# Patient Record
Sex: Female | Born: 1950
Health system: Southern US, Community
[De-identification: ages and names within clinical notes are randomized; demographics above are authoritative.]

## PROBLEM LIST (undated history)

## (undated) DIAGNOSIS — I6529 Occlusion and stenosis of unspecified carotid artery: Secondary | ICD-10-CM

## (undated) DIAGNOSIS — I739 Peripheral vascular disease, unspecified: Secondary | ICD-10-CM

## (undated) DIAGNOSIS — Z973 Presence of spectacles and contact lenses: Secondary | ICD-10-CM

## (undated) DIAGNOSIS — E7800 Pure hypercholesterolemia, unspecified: Secondary | ICD-10-CM

## (undated) DIAGNOSIS — R011 Cardiac murmur, unspecified: Secondary | ICD-10-CM

## (undated) DIAGNOSIS — F32A Depression, unspecified: Secondary | ICD-10-CM

## (undated) DIAGNOSIS — F329 Major depressive disorder, single episode, unspecified: Secondary | ICD-10-CM

## (undated) DIAGNOSIS — Z800 Family history of malignant neoplasm of digestive organs: Secondary | ICD-10-CM

## (undated) DIAGNOSIS — I219 Acute myocardial infarction, unspecified: Secondary | ICD-10-CM

## (undated) DIAGNOSIS — I10 Essential (primary) hypertension: Secondary | ICD-10-CM

## (undated) DIAGNOSIS — I209 Angina pectoris, unspecified: Secondary | ICD-10-CM

## (undated) DIAGNOSIS — I639 Cerebral infarction, unspecified: Secondary | ICD-10-CM

## (undated) DIAGNOSIS — E119 Type 2 diabetes mellitus without complications: Secondary | ICD-10-CM

## (undated) DIAGNOSIS — F419 Anxiety disorder, unspecified: Secondary | ICD-10-CM

## (undated) DIAGNOSIS — I2510 Atherosclerotic heart disease of native coronary artery without angina pectoris: Secondary | ICD-10-CM

## (undated) DIAGNOSIS — M1990 Unspecified osteoarthritis, unspecified site: Secondary | ICD-10-CM

## (undated) DIAGNOSIS — K219 Gastro-esophageal reflux disease without esophagitis: Secondary | ICD-10-CM

## (undated) HISTORY — DX: Peripheral vascular disease, unspecified: I73.9

## (undated) HISTORY — PX: CORONARY ANGIOPLASTY WITH STENT PLACEMENT: SHX49

## (undated) HISTORY — PX: ABDOMINAL HYSTERECTOMY: SHX81

## (undated) HISTORY — PX: TUBAL LIGATION: SHX77

## (undated) HISTORY — DX: Family history of malignant neoplasm of digestive organs: Z80.0

## (undated) HISTORY — PX: CAROTID ENDARTERECTOMY: SUR193

---

## 1988-10-12 DIAGNOSIS — I219 Acute myocardial infarction, unspecified: Secondary | ICD-10-CM

## 1988-10-12 HISTORY — DX: Acute myocardial infarction, unspecified: I21.9

## 2003-02-12 DIAGNOSIS — I2510 Atherosclerotic heart disease of native coronary artery without angina pectoris: Secondary | ICD-10-CM

## 2003-02-12 HISTORY — DX: Atherosclerotic heart disease of native coronary artery without angina pectoris: I25.10

## 2010-07-18 ENCOUNTER — Emergency Department (HOSPITAL_COMMUNITY): Payer: Medicaid Other

## 2010-07-18 ENCOUNTER — Emergency Department (HOSPITAL_COMMUNITY)
Admission: EM | Admit: 2010-07-18 | Discharge: 2010-07-18 | Disposition: A | Discharge status: Home / Self Care | Payer: Medicaid Other | Attending: Emergency Medicine | Admitting: Emergency Medicine

## 2010-07-18 DIAGNOSIS — R05 Cough: Secondary | ICD-10-CM | POA: Insufficient documentation

## 2010-07-18 DIAGNOSIS — R059 Cough, unspecified: Secondary | ICD-10-CM | POA: Insufficient documentation

## 2010-07-18 DIAGNOSIS — E119 Type 2 diabetes mellitus without complications: Secondary | ICD-10-CM | POA: Insufficient documentation

## 2010-07-18 DIAGNOSIS — E785 Hyperlipidemia, unspecified: Secondary | ICD-10-CM | POA: Insufficient documentation

## 2010-07-18 DIAGNOSIS — I10 Essential (primary) hypertension: Secondary | ICD-10-CM | POA: Insufficient documentation

## 2010-10-02 ENCOUNTER — Emergency Department (HOSPITAL_COMMUNITY)
Admission: EM | Admit: 2010-10-02 | Discharge: 2010-10-02 | Disposition: A | Discharge status: Home / Self Care | Payer: Medicaid Other | Attending: Emergency Medicine | Admitting: Emergency Medicine

## 2010-10-02 DIAGNOSIS — E7800 Pure hypercholesterolemia, unspecified: Secondary | ICD-10-CM | POA: Insufficient documentation

## 2010-10-02 DIAGNOSIS — I10 Essential (primary) hypertension: Secondary | ICD-10-CM | POA: Insufficient documentation

## 2010-10-02 DIAGNOSIS — I252 Old myocardial infarction: Secondary | ICD-10-CM | POA: Insufficient documentation

## 2010-10-02 DIAGNOSIS — R51 Headache: Secondary | ICD-10-CM | POA: Insufficient documentation

## 2010-10-02 DIAGNOSIS — E119 Type 2 diabetes mellitus without complications: Secondary | ICD-10-CM | POA: Insufficient documentation

## 2010-10-02 LAB — BASIC METABOLIC PANEL
BUN: 18 mg/dL (ref 6–23)
Chloride: 102 mEq/L (ref 96–112)
Creatinine, Ser: 0.56 mg/dL (ref 0.50–1.10)
GFR calc Af Amer: >60 mL/min (ref >60)
GFR calc non Af Amer: >60 mL/min (ref >60)
Glucose, Bld: 110 mg/dL — ABNORMAL HIGH (ref 70–99)

## 2010-10-02 LAB — CBC
HCT: 36 % (ref 36.0–46.0)
Hemoglobin: 11.7 g/dL — ABNORMAL LOW (ref 12.0–15.0)
MCHC: 32.5 g/dL (ref 30.0–36.0)
MCV: 78.1 fL (ref 78.0–100.0)
RDW: 15.2 % (ref 11.5–15.5)

## 2010-10-02 LAB — DIFFERENTIAL
Eosinophils Relative: 1 % (ref 0–5)
Lymphocytes Relative: 44 % (ref 12–46)
Lymphs Abs: 4.5 10*3/uL — ABNORMAL HIGH (ref 0.7–4.0)
Monocytes Relative: 5 % (ref 3–12)
Neutro Abs: 5.1 10*3/uL (ref 1.7–7.7)

## 2011-05-31 ENCOUNTER — Emergency Department (HOSPITAL_COMMUNITY): Payer: Medicaid Other

## 2011-05-31 ENCOUNTER — Observation Stay (HOSPITAL_COMMUNITY): Payer: Medicaid Other

## 2011-05-31 ENCOUNTER — Encounter (HOSPITAL_COMMUNITY): Payer: Self-pay | Admitting: Emergency Medicine

## 2011-05-31 ENCOUNTER — Observation Stay (HOSPITAL_COMMUNITY)
Admission: EM | Admit: 2011-05-31 | Discharge: 2011-06-03 | Disposition: A | Discharge status: Home / Self Care | Payer: Medicaid Other | Attending: Internal Medicine | Admitting: Internal Medicine

## 2011-05-31 DIAGNOSIS — E785 Hyperlipidemia, unspecified: Secondary | ICD-10-CM | POA: Diagnosis present

## 2011-05-31 DIAGNOSIS — E876 Hypokalemia: Secondary | ICD-10-CM

## 2011-05-31 DIAGNOSIS — F17200 Nicotine dependence, unspecified, uncomplicated: Secondary | ICD-10-CM | POA: Insufficient documentation

## 2011-05-31 DIAGNOSIS — I059 Rheumatic mitral valve disease, unspecified: Secondary | ICD-10-CM

## 2011-05-31 DIAGNOSIS — I10 Essential (primary) hypertension: Secondary | ICD-10-CM | POA: Diagnosis present

## 2011-05-31 DIAGNOSIS — I2510 Atherosclerotic heart disease of native coronary artery without angina pectoris: Secondary | ICD-10-CM | POA: Diagnosis present

## 2011-05-31 DIAGNOSIS — R0789 Other chest pain: Secondary | ICD-10-CM

## 2011-05-31 DIAGNOSIS — D649 Anemia, unspecified: Secondary | ICD-10-CM | POA: Diagnosis present

## 2011-05-31 DIAGNOSIS — D72829 Elevated white blood cell count, unspecified: Secondary | ICD-10-CM | POA: Diagnosis present

## 2011-05-31 DIAGNOSIS — R079 Chest pain, unspecified: Secondary | ICD-10-CM

## 2011-05-31 DIAGNOSIS — E7800 Pure hypercholesterolemia, unspecified: Secondary | ICD-10-CM | POA: Insufficient documentation

## 2011-05-31 DIAGNOSIS — Z9861 Coronary angioplasty status: Secondary | ICD-10-CM | POA: Insufficient documentation

## 2011-05-31 DIAGNOSIS — E119 Type 2 diabetes mellitus without complications: Secondary | ICD-10-CM | POA: Diagnosis present

## 2011-05-31 HISTORY — DX: Anemia, unspecified: D64.9

## 2011-05-31 HISTORY — DX: Elevated white blood cell count, unspecified: D72.829

## 2011-05-31 HISTORY — DX: Unspecified osteoarthritis, unspecified site: M19.90

## 2011-05-31 HISTORY — DX: Depression, unspecified: F32.A

## 2011-05-31 HISTORY — DX: Essential (primary) hypertension: I10

## 2011-05-31 HISTORY — DX: Angina pectoris, unspecified: I20.9

## 2011-05-31 HISTORY — DX: Hyperlipidemia, unspecified: E78.5

## 2011-05-31 HISTORY — DX: Major depressive disorder, single episode, unspecified: F32.9

## 2011-05-31 HISTORY — DX: Cerebral infarction, unspecified: I63.9

## 2011-05-31 HISTORY — DX: Atherosclerotic heart disease of native coronary artery without angina pectoris: I25.10

## 2011-05-31 HISTORY — DX: Hypokalemia: E87.6

## 2011-05-31 HISTORY — DX: Acute myocardial infarction, unspecified: I21.9

## 2011-05-31 HISTORY — DX: Other chest pain: R07.89

## 2011-05-31 LAB — CBC
HCT: 31.2 % — ABNORMAL LOW (ref 36.0–46.0)
Hemoglobin: 10.3 g/dL — ABNORMAL LOW (ref 12.0–15.0)
MCH: 25.9 pg — ABNORMAL LOW (ref 26.0–34.0)
MCHC: 33 g/dL (ref 30.0–36.0)
Platelets: 331 10*3/uL (ref 150–400)
RBC: 3.99 MIL/uL (ref 3.87–5.11)
RDW: 15 % (ref 11.5–15.5)
RDW: 15 % (ref 11.5–15.5)
WBC: 9.9 10*3/uL (ref 4.0–10.5)

## 2011-05-31 LAB — VITAMIN B12: Vitamin B-12: 456 pg/mL (ref 211–911)

## 2011-05-31 LAB — POCT I-STAT TROPONIN I: Troponin i, poc: 0.01 ng/mL (ref 0.00–0.08)

## 2011-05-31 LAB — LIPID PANEL
HDL: 42 mg/dL (ref >39)
LDL Cholesterol: 72 mg/dL (ref 0–99)
Total CHOL/HDL Ratio: 3.6 RATIO
Triglycerides: 190 mg/dL — ABNORMAL HIGH (ref <150)
VLDL: 38 mg/dL (ref 0–40)

## 2011-05-31 LAB — COMPREHENSIVE METABOLIC PANEL
Albumin: 4.1 g/dL (ref 3.5–5.2)
Alkaline Phosphatase: 50 U/L (ref 39–117)
BUN: 18 mg/dL (ref 6–23)
Calcium: 10.3 mg/dL (ref 8.4–10.5)
GFR calc Af Amer: >90 mL/min (ref >90)
Glucose, Bld: 124 mg/dL — ABNORMAL HIGH (ref 70–99)
Potassium: 3 mEq/L — ABNORMAL LOW (ref 3.5–5.1)
Total Protein: 7.3 g/dL (ref 6.0–8.3)

## 2011-05-31 LAB — CARDIAC PANEL(CRET KIN+CKTOT+MB+TROPI)
CK, MB: 1.6 ng/mL (ref 0.3–4.0)
Relative Index: 1.5 (ref 0.0–2.5)
Relative Index: 1.4 (ref 0.0–2.5)
Total CK: 104 U/L (ref 7–177)
Total CK: 114 U/L (ref 7–177)
Troponin I: <0.3 ng/mL (ref <0.30)
Troponin I: <0.3 ng/mL (ref <0.30)

## 2011-05-31 LAB — D-DIMER, QUANTITATIVE: D-Dimer, Quant: 11.16 ug/mL-FEU — ABNORMAL HIGH (ref 0.00–0.48)

## 2011-05-31 LAB — FERRITIN: Ferritin: 198 ng/mL (ref 10–291)

## 2011-05-31 LAB — GLUCOSE, CAPILLARY
Glucose-Capillary: 108 mg/dL — ABNORMAL HIGH (ref 70–99)
Glucose-Capillary: 112 mg/dL — ABNORMAL HIGH (ref 70–99)

## 2011-05-31 LAB — RETICULOCYTES
RBC.: 3.86 MIL/uL — ABNORMAL LOW (ref 3.87–5.11)
Retic Ct Pct: 1.2 % (ref 0.4–3.1)

## 2011-05-31 LAB — MAGNESIUM: Magnesium: 1.7 mg/dL (ref 1.5–2.5)

## 2011-05-31 LAB — CREATININE, SERUM
Creatinine, Ser: 0.66 mg/dL (ref 0.50–1.10)
GFR calc Af Amer: >90 mL/min (ref >90)

## 2011-05-31 MED ORDER — IOHEXOL 350 MG/ML SOLN
80.0000 mL | Freq: Once | INTRAVENOUS | Status: AC | PRN
  Administered 2011-05-31: 80 mL via INTRAVENOUS

## 2011-05-31 MED ORDER — HYDROCHLOROTHIAZIDE 12.5 MG PO CAPS
12.5000 mg | ORAL_CAPSULE | Freq: Every day | ORAL | Status: DC
  Administered 2011-05-31 – 2011-06-03 (×4): 12.5 mg via ORAL
  Filled 2011-05-31 (×4): qty 1

## 2011-05-31 MED ORDER — GI COCKTAIL ~~LOC~~
30.0000 mL | Freq: Once | ORAL | Status: AC
  Administered 2011-05-31: 30 mL via ORAL
  Filled 2011-05-31: qty 30

## 2011-05-31 MED ORDER — NITROGLYCERIN 0.4 MG SL SUBL: 0.4000 mg | SUBLINGUAL_TABLET | SUBLINGUAL | Status: DC | PRN

## 2011-05-31 MED ORDER — SIMVASTATIN 5 MG PO TABS
5.0000 mg | ORAL_TABLET | Freq: Every day | ORAL | Status: DC
  Administered 2011-05-31 – 2011-06-02 (×3): 5 mg via ORAL
  Filled 2011-05-31 (×4): qty 1

## 2011-05-31 MED ORDER — SODIUM CHLORIDE 0.9 % IJ SOLN
3.0000 mL | Freq: Two times a day (BID) | INTRAMUSCULAR | Status: DC
  Administered 2011-05-31 – 2011-06-03 (×2): 3 mL via INTRAVENOUS

## 2011-05-31 MED ORDER — ASPIRIN EC 81 MG PO TBEC
81.0000 mg | DELAYED_RELEASE_TABLET | Freq: Every day | ORAL | Status: DC
  Administered 2011-05-31: 81 mg via ORAL
  Filled 2011-05-31: qty 1

## 2011-05-31 MED ORDER — INSULIN ASPART 100 UNIT/ML ~~LOC~~ SOLN
0.0000 [IU] | Freq: Three times a day (TID) | SUBCUTANEOUS | Status: DC
  Administered 2011-06-03: 1 [IU] via SUBCUTANEOUS

## 2011-05-31 MED ORDER — LISINOPRIL-HYDROCHLOROTHIAZIDE 20-12.5 MG PO TABS: 1.0000 | ORAL_TABLET | Freq: Every day | ORAL | Status: DC

## 2011-05-31 MED ORDER — AMLODIPINE BESYLATE 5 MG PO TABS
5.0000 mg | ORAL_TABLET | Freq: Every day | ORAL | Status: DC
  Administered 2011-05-31: 5 mg via ORAL
  Filled 2011-05-31: qty 1

## 2011-05-31 MED ORDER — ONDANSETRON HCL 4 MG/2ML IJ SOLN: 4.0000 mg | Freq: Four times a day (QID) | INTRAMUSCULAR | Status: DC | PRN

## 2011-05-31 MED ORDER — SODIUM CHLORIDE 0.9 % IV SOLN: 1000.0000 mL | INTRAVENOUS | Status: DC

## 2011-05-31 MED ORDER — ENOXAPARIN SODIUM 40 MG/0.4ML ~~LOC~~ SOLN
40.0000 mg | Freq: Every day | SUBCUTANEOUS | Status: DC
  Administered 2011-05-31: 40 mg via SUBCUTANEOUS
  Filled 2011-05-31: qty 0.4

## 2011-05-31 MED ORDER — METOPROLOL TARTRATE 25 MG PO TABS
25.0000 mg | ORAL_TABLET | Freq: Two times a day (BID) | ORAL | Status: AC
Start: 2011-05-31 — End: 2011-06-02
  Administered 2011-05-31 – 2011-06-02 (×5): 25 mg via ORAL
  Filled 2011-05-31 (×7): qty 1

## 2011-05-31 MED ORDER — HYDROCODONE-ACETAMINOPHEN 5-325 MG PO TABS
1.0000 | ORAL_TABLET | ORAL | Status: DC | PRN
  Administered 2011-06-01 – 2011-06-02 (×3): 1 via ORAL
  Filled 2011-05-31 (×3): qty 1

## 2011-05-31 MED ORDER — MORPHINE SULFATE 2 MG/ML IJ SOLN
1.0000 mg | INTRAMUSCULAR | Status: DC | PRN
  Filled 2011-05-31: qty 1

## 2011-05-31 MED ORDER — SENNOSIDES-DOCUSATE SODIUM 8.6-50 MG PO TABS
1.0000 | ORAL_TABLET | Freq: Every evening | ORAL | Status: DC | PRN
  Filled 2011-05-31: qty 1

## 2011-05-31 MED ORDER — ONDANSETRON HCL 4 MG PO TABS: 4.0000 mg | ORAL_TABLET | Freq: Four times a day (QID) | ORAL | Status: DC | PRN

## 2011-05-31 MED ORDER — NIACIN ER 500 MG PO CPCR
500.0000 mg | ORAL_CAPSULE | Freq: Every day | ORAL | Status: DC
  Administered 2011-05-31: 500 mg via ORAL
  Filled 2011-05-31 (×3): qty 1

## 2011-05-31 MED ORDER — POTASSIUM CHLORIDE CRYS ER 20 MEQ PO TBCR
40.0000 meq | EXTENDED_RELEASE_TABLET | Freq: Once | ORAL | Status: AC
  Administered 2011-05-31: 40 meq via ORAL
  Filled 2011-05-31: qty 2

## 2011-05-31 MED ORDER — ADULT MULTIVITAMIN W/MINERALS CH
1.0000 | ORAL_TABLET | Freq: Every day | ORAL | Status: DC
  Administered 2011-05-31 – 2011-06-03 (×4): 1 via ORAL
  Filled 2011-05-31 (×4): qty 1

## 2011-05-31 MED ORDER — SODIUM CHLORIDE 0.45 % IV SOLN
INTRAVENOUS | Status: DC
  Administered 2011-05-31 – 2011-06-03 (×3): via INTRAVENOUS

## 2011-05-31 MED ORDER — ONDANSETRON HCL 4 MG/2ML IJ SOLN
4.0000 mg | INTRAMUSCULAR | Status: DC | PRN
  Administered 2011-05-31: 4 mg via INTRAVENOUS
  Filled 2011-05-31: qty 2

## 2011-05-31 MED ORDER — ENOXAPARIN SODIUM 60 MG/0.6ML ~~LOC~~ SOLN
60.0000 mg | Freq: Two times a day (BID) | SUBCUTANEOUS | Status: DC
  Administered 2011-05-31 – 2011-06-01 (×2): 60 mg via SUBCUTANEOUS
  Filled 2011-05-31 (×4): qty 0.6

## 2011-05-31 MED ORDER — ASPIRIN EC 325 MG PO TBEC
325.0000 mg | DELAYED_RELEASE_TABLET | Freq: Every day | ORAL | Status: DC
  Administered 2011-06-01 – 2011-06-03 (×3): 325 mg via ORAL
  Filled 2011-05-31 (×3): qty 1

## 2011-05-31 MED ORDER — MORPHINE SULFATE 4 MG/ML IJ SOLN
4.0000 mg | Freq: Once | INTRAMUSCULAR | Status: AC
  Administered 2011-05-31: 4 mg via INTRAVENOUS
  Filled 2011-05-31: qty 1

## 2011-05-31 MED ORDER — SODIUM CHLORIDE 0.9 % IV SOLN
1000.0000 mL | Freq: Once | INTRAVENOUS | Status: AC
  Administered 2011-05-31: 1000 mL via INTRAVENOUS

## 2011-05-31 MED ORDER — ZOLPIDEM TARTRATE 5 MG PO TABS
5.0000 mg | ORAL_TABLET | Freq: Every evening | ORAL | Status: DC | PRN
  Administered 2011-05-31 – 2011-06-02 (×3): 5 mg via ORAL
  Filled 2011-05-31 (×3): qty 1

## 2011-05-31 MED ORDER — NITROGLYCERIN 0.2 MG/HR TD PT24
0.2000 mg | MEDICATED_PATCH | Freq: Every day | TRANSDERMAL | Status: DC
  Administered 2011-05-31 – 2011-06-03 (×4): 0.2 mg via TRANSDERMAL
  Filled 2011-05-31 (×4): qty 1

## 2011-05-31 MED ORDER — ACETAMINOPHEN 325 MG PO TABS
650.0000 mg | ORAL_TABLET | Freq: Four times a day (QID) | ORAL | Status: DC | PRN
  Administered 2011-06-02: 650 mg via ORAL
  Filled 2011-05-31: qty 2

## 2011-05-31 MED ORDER — ACETAMINOPHEN 650 MG RE SUPP: 650.0000 mg | Freq: Four times a day (QID) | RECTAL | Status: DC | PRN

## 2011-05-31 MED ORDER — LISINOPRIL 20 MG PO TABS
20.0000 mg | ORAL_TABLET | Freq: Every day | ORAL | Status: DC
  Administered 2011-05-31 – 2011-06-03 (×4): 20 mg via ORAL
  Filled 2011-05-31 (×4): qty 1

## 2011-05-31 MED ORDER — MORPHINE SULFATE 2 MG/ML IJ SOLN
2.0000 mg | Freq: Once | INTRAMUSCULAR | Status: AC
  Administered 2011-05-31: 2 mg via INTRAVENOUS

## 2011-05-31 NOTE — Progress Notes (Signed)
Patient was seen and examined, admitted by Dr. Jonelle Sidle this morning, complaining of chest pain, poor historian, history of coronary artery disease with PCI - Continue to rule out ACS, stat d-dimer was obtained, elevated at 11.6, stat CTA chest was obtained negative for pulmonary embolism Doppler ultrasound of the lower extremities. Gave GI cocktail - Called cardiology consultation for assistance, no followup after her PCI    Forest Pruden M.D. [facility] 05/31/2011, 3:46 PM  Pager: [phone]

## 2011-05-31 NOTE — ED Notes (Signed)
Per EMS, pt has been having midsternal CP x 2days. Pain became worse tonight so she called EMS. SBP 260 upon arrival. 12 lead unremarkable. 2 Nitroglycerin SL. Last BP 160/90. Pain decreased from 8 to 4. 324 Baby ASA PO given. Pt stated that she took Vicodin earlier tonight to assist with pain. 20g Left wrist.

## 2011-05-31 NOTE — ED Provider Notes (Signed)
History     CSN: [id number]  Arrival date & time 05/31/11  0003   First MD Initiated Contact with Patient 05/31/11 0018      Chief Complaint  Patient presents with  . Chest Pain    (Consider location/radiation/quality/duration/timing/severity/associated sxs/prior treatment) HPI Comments: Patient with a history of CAD, diabetes, and hypertension presents emergency Department with a chief complaint of chest pain.  Onset of symptoms began 3 days ago, is intermittent in nature, location midsternal, no radiation, characterized as a squeezing pressure type sensation, severity 6/10, pain worse with movement but not exertion or pleurisy.  Patient denies associated symptoms including shortness of breath, dyspnea on exertion, orthopnea, PND, leg swelling, diaphoresis, nausea, abdominal pain, cough, hemoptysis, fever, night sweats, chills.  Patient states she currently does not have a cardiologist but has new patient appointment with a power cardiology scheduled for Monday morning.  Patient does not have a PCP.  No other complaints at this time. Pt denies being on blood thinners besides baby ASA. Pt given 324 ASA and nitro x 2 en route   Patient is a 61 y.o. female presenting with chest pain. The history is provided by the patient.  Chest Pain     Past Medical History  Diagnosis Date  . Hypertension   . Diabetes mellitus     Past Surgical History  Procedure Date  . Coronary stent placement     History reviewed. No pertinent family history.  History  Substance Use Topics  . Smoking status: Former Research scientist (life sciences)  . Smokeless tobacco: Not on file  . Alcohol Use: No    OB History    Grav Para Term Preterm Abortions TAB SAB Ect Mult Living                  Review of Systems  Cardiovascular: Positive for chest pain.  All other systems reviewed and are negative.    Allergies  Review of patient's allergies indicates no known allergies.  Home Medications   Current Outpatient Rx  Name  Route Sig Dispense Refill  . AMLODIPINE BESYLATE 5 MG PO TABS Oral Take 5 mg by mouth daily.    . ASPIRIN EC 81 MG PO TBEC Oral Take 81 mg by mouth daily.    Marland Kitchen HYDROCODONE-ACETAMINOPHEN 5-500 MG PO TABS Oral Take 1 tablet by mouth every 4 (four) hours as needed. For pain    . LISINOPRIL-HYDROCHLOROTHIAZIDE 20-12.5 MG PO TABS Oral Take 1 tablet by mouth daily.    Marland Kitchen METFORMIN HCL 500 MG PO TABS Oral Take 500 mg by mouth 2 (two) times daily with a meal.    . ADULT MULTIVITAMIN W/MINERALS CH Oral Take 1 tablet by mouth daily.    Marland Kitchen NIACIN ER (ANTIHYPERLIPIDEMIC) 500 MG PO TBCR Oral Take 500 mg by mouth at bedtime.    Marland Kitchen NITROGLYCERIN 0.4 MG SL SUBL Sublingual Place 0.4 mg under the tongue every 5 (five) minutes as needed. For chest pain    . PRAVASTATIN SODIUM 40 MG PO TABS Oral Take 40 mg by mouth daily.    Marland Kitchen ZOLPIDEM TARTRATE 5 MG PO TABS Oral Take 5 mg by mouth at bedtime as needed. For sleep      BP 134/61  Pulse 63  Temp(Src) 98.1 F (36.7 C) (Oral)  Resp 17  SpO2 100%  Physical Exam  Nursing note and vitals reviewed. Constitutional: She is oriented to person, place, and time. She appears well-developed and well-nourished. No distress.  HENT:  Head: Normocephalic and atraumatic.  Eyes: Conjunctivae and EOM are normal.  Neck: Normal range of motion.       No JVD, tracheal deviation, stridor  Cardiovascular:       Regular rate rhythm, no pitting edema, distal pulses intact.  No JVD or aberrant sounds on  auscultation.  Pulmonary/Chest: Effort normal.       Lungs clear to auscultation bilaterally  Abdominal:       Soft nontender abdomen.  Normal bowel sounds.  Non-pulsatile aorta.  Musculoskeletal: Normal range of motion.  Neurological: She is alert and oriented to person, place, and time.  Skin: Skin is warm and dry. No rash noted. She is not diaphoretic.  Psychiatric: She has a normal mood and affect. Her behavior is normal.    ED Course  Procedures (including critical care  time)   Labs Reviewed  CBC  URINALYSIS, ROUTINE W REFLEX MICROSCOPIC  COMPREHENSIVE METABOLIC PANEL   No results found.   Date: 05/31/2011  Rate: 61  Rhythm: normal sinus rhythm  QRS Axis: normal  Intervals: normal  ST/T Wave abnormalities: borderline flat t waves in inferior leads (on old EKG)  Conduction Disutrbances: none  Narrative Interpretation:   Old EKG Reviewed:No significant changes    No diagnosis found.    MDM  Chest pain   Concern for cardiac etiology of Chest Pain. Cardiology will likely be consulted. Pt does not meet criteria for CP protocol and a further evaluation is recommended. EKG reviewed, labs pending. This case was discussed with Dr. Winfred Leeds who has seen the patient, will resume care and agrees with plan to admit.          Verl Dicker, [place] 06/03/11 0615  Medical screening examination/treatment/procedure(s) were conducted as a shared visit with non-physician practitioner(s) and myself.  I personally evaluated the patient during the encounter  Orlie Dakin, MD 06/07/11 [phone]

## 2011-05-31 NOTE — Consult Note (Signed)
CARDIOLOGY CONSULT NOTE  Patient ID: Joanne Morris, MRN: [id number], DOB/AGE: 1950/05/22 61 y.o. Admit date: 05/31/2011 Date of Consult: 05/31/2011  Primary Physician: Dr. Jeanie Cooks Primary Cardiologist: New  Chief Complaint: Chest pain Reason for Consultation: Chest pain  HPI: 61 y.o. female w/ PMHx significant for CAD w/ PCI ~2008 in [place], HTN, HLD, DMII, and CVA who presented to [facility] on 05/31/2011 with complaints of chest pain.  She reports having a heart attack in ~2008 with stent placement at [facility] and has not had any cardiology follow up. She states since that time she has had intermittent chest pain. She describes it as sharp, located in the epigastric region with radiation down into her stomach, associated with nausea. It usually lasts around 2-1mins. Occurs with rest and exertion. She presented to the ED because the pain started two days ago and has waxed and waned since then without relief. Denies diaphoresis, sob, or dizziness. She is fairly active and does a lot of walking, although she says not as much because of right knee pain from arthritis. She does not weigh herself but feels like she has lost "a lot" of weight over the last two years. She estimates about 20-25lbs. Denies sob, doe, edema, orthopnea, change in appetite, melena, hematochezia, recent illness, fever, abd pain, or syncope. She states she takes all her medications as prescribed.   In the ED EKG showed NSR 64bpm with NO acute ST/T changes. CXR showed mild vascular congestion w/o significant pulm edema. Labs significant for cardiac enzymes negative x2, pBNP 46, K+ 3.0, WBC 11.8 --> 9.9, H&H 10.1/31.4, Crt 0.78, LDL72. SBPs have been in the range of 130-160s. Her amlodipine was stopped, Lopressor started, and potassium supplemented. She was admitted by medicine and cardiology is asked to consult for evaluation of her chest pain. She reports having some mild chest pain now, but states it  was improved a lot with GI Cocktail.   Past Medical History  Diagnosis Date  . Hypertension   . Diabetes mellitus   . Coronary artery disease   . Angina   . Myocardial infarction     per patient  . Stroke     per patient  . Arthritis     knee  per patient  . Depression       Surgical History:  Past Surgical History  Procedure Date  . Coronary stent placement   . Abdominal hysterectomy   . Tubal ligation      Home Meds: Medication Sig  amLODipine (NORVASC) 5 MG tablet Take 5 mg by mouth daily.  aspirin EC 81 MG tablet Take 81 mg by mouth daily.  HYDROcodone-acetaminophen (VICODIN) 5-500 MG per tablet Take 1 tablet by mouth every 4 (four) hours as needed. For pain  lisinopril-hydrochlorothiazide (PRINZIDE,ZESTORETIC) 20-12.5 MG per tablet Take 1 tablet by mouth daily.  metFORMIN (GLUCOPHAGE) 500 MG tablet Take 500 mg by mouth 2 (two) times daily with a meal.  Multiple Vitamin (MULITIVITAMIN WITH MINERALS) TABS Take 1 tablet by mouth daily.  niacin (NIASPAN) 500 MG CR tablet Take 500 mg by mouth at bedtime.  nitroGLYCERIN (NITROSTAT) 0.4 MG SL tablet Place 0.4 mg under the tongue every 5 (five) minutes as needed. For chest pain  pravastatin (PRAVACHOL) 40 MG tablet Take 40 mg by mouth daily.  zolpidem (AMBIEN) 5 MG tablet Take 5 mg by mouth at bedtime as needed. For sleep   Inpatient Medications:   . sodium chloride  1,000 mL Intravenous Once  .  aspirin EC  325 mg Oral Daily  . enoxaparin  40 mg Subcutaneous Daily  . gi cocktail  30 mL Oral Once  . lisinopril  20 mg Oral Daily   And  . hydrochlorothiazide  12.5 mg Oral Daily  . insulin aspart  0-9 Units Subcutaneous TID WC  . metoprolol tartrate  25 mg Oral BID  .  morphine injection  2 mg Intravenous Once  .  morphine injection  4 mg Intravenous Once  . mulitivitamin with minerals  1 tablet Oral Daily  . niacin  500 mg Oral QHS  . nitroGLYCERIN  0.2 mg Transdermal Daily  . potassium chloride  40 mEq Oral Once  .  simvastatin  5 mg Oral q1800  . sodium chloride  3 mL Intravenous Q12H  . DISCONTD: amLODipine  5 mg Oral Daily   . sodium chloride 75 mL/hr at 05/31/11 1142    Allergies: No Known Allergies   Social History  . Marital Status: Single   Occupational History  . Not on file.   Social History Main Topics  . Smoking status: Former Smoker    Quit date: 2012  . Smokeless tobacco: Never Used  . Alcohol Use: No  . Drug Use: No  . Sexually Active: No   Family history: No known cardiac disease  Review of Systems: General: (+) unintentional weight loss; negative for chills, fever, night sweats Cardiovascular: As per HPI Dermatological: negative for rash Respiratory: negative for cough or wheezing Urologic: negative for hematuria Abdominal: negative for nausea, vomiting, diarrhea, bright red blood per rectum, melena, or hematemesis Neurologic: negative for visual changes, syncope, or dizziness All other systems reviewed and are otherwise negative except as noted above.  Labs:   05/31/2011 00:33 05/31/2011 07:03 05/31/2011 11:25  CK, MB  1.6 1.8  CK Total  104 114  Troponin I  <0.30 <0.30  Troponin i, poc 0.01    Pro B Natriuretic peptide (BNP)  46.5    Component Value Date   WBC 9.9 05/31/2011   HGB 10.1* 05/31/2011   HCT 31.4* 05/31/2011   MCV 78.7 05/31/2011   PLT 331 05/31/2011     05/31/2011 04:57  Ferritin 198  Folate >20.0  Vitamin B-12 456   Lab 05/31/11 0025  NA 140  K 3.0*  CL 99  CO2 26  BUN 18  CREATININE 0.78  CALCIUM 10.3  PROT 7.3  BILITOT 0.2*  ALKPHOS 50  ALT 12  AST 20  GLUCOSE 124*   Component Value Date   CHOL 152 05/31/2011   HDL 42 05/31/2011   LDLCALC 72 05/31/2011   TRIG 190* 05/31/2011     Radiology/Studies:   05/31/2011 - CXR Findings: The lungs are well-aerated.  Mild vascular congestion is noted, without significant pulmonary edema.  There is no evidence of focal opacification, pleural effusion or pneumothorax.  The cardiomediastinal  silhouette is borderline normal in size.  No acute osseous abnormalities are seen.  IMPRESSION: Mild vascular congestion noted, without significant pulmonary edema.   EKG: 05/31/11 @ 1055 - NSR 64bpm, NO acute ST/T changes  Physical Exam: Blood pressure 168/79, pulse 65, temperature 98.5 F (36.9 C), temperature source Oral, resp. rate 18, height 4\' 11"  (1.499 m), weight 131 lb (59.421 kg), SpO2 100.00%. General: Well developed, well nourished, black female, in no acute distress. Head: Normocephalic, atraumatic, sclera non-icteric, no xanthomas, nares are without discharge.  Neck: Supple. Negative for carotid bruits. JVD not elevated. Lungs: Clear bilaterally to auscultation without wheezes, rales,  or rhonchi. Breathing is unlabored. Heart: RRR with S1 S2. No murmurs, rubs, or gallops appreciated. Abdomen: Soft, non-tender, non-distended with normoactive bowel sounds. No hepatomegaly. No rebound/guarding. No obvious abdominal masses. Msk:  Strength and tone appear normal for age. Extremities: No clubbing or cyanosis. No edema.  Distal pedal pulses are 2+ and equal bilaterally. Neuro: Alert and oriented X 3. Moves all extremities spontaneously. Psych:  Responds to questions appropriately with a normal affect.   Assessment and Plan:  61 y.o. female w/ PMHx significant for CAD w/ PCI ~2008 in [place], HTN, HLD, DMII, and CVA who presented to [facility] on 05/31/2011 with complaints of chest pain.  1. Chest Pain: She reports h/o CAD s/p PCI in 2008 after having a heart attack. Presents with atypical chest pain that has been constant for two days and relieved with GI cocktail. Cardiac enzymes negative x2 and EKG w/o acute ST/T changes. Doubt ACS.  Has multiple cardiac risk factors and would benefit from outpatient f/u with our office with consideration for stress test but will need to obtain records from [facility]. Cont ASA, BB, statin. Sounds more consistent with GI etiology. With her  unintentional weight loss and anemia would recommend GI workup.  2. HTN: She reports taking her BP meds as prescribed. Amlodipine stopped and Lopressor added upon admission. SBPs 130-160s now.   3. HLD: LDL 72, cont statin   Signed, Morris, Joanne Morris 05/31/2011, 12:13 PM   Patient seen and examined on 4/19.  Agree with findings of Joanne Morris History of reported CAD.  Need to get records from [facility] Exam:  LUngs:CTA.  Cardiac.  RRR.  No S3.  No murmurs.  Ext:  No edema.  I am not convinced CP is cardiac.  QUestion GI.  WIll admit.  Check cardiac enzymes.  W/U based on results.

## 2011-05-31 NOTE — Progress Notes (Signed)
  Echocardiogram 2D Echocardiogram has been performed.  Shatarra Wehling L 05/31/2011, 4:44 PM

## 2011-05-31 NOTE — Progress Notes (Signed)
ANTICOAGULATION CONSULT NOTE - Initial Consult  Pharmacy Consult for Lovenox Indication: r/o pulmonary embolus  No Known Allergies  Patient Measurements: Height: 4\' 11"  (149.9 cm) Weight: 131 lb (59.421 kg) IBW/kg (Calculated) : 43.2   Vital Signs: Temp: 98.5 F (36.9 C) (04/19 0815) Temp src: Oral (04/19 0815) BP: 168/79 mmHg (04/19 1058) Pulse Rate: 65  (04/19 1058)  Labs:  Basename 05/31/11 1125 05/31/11 0703 05/31/11 0025  HGB -- 10.1* 10.3*  HCT -- 31.4* 31.2*  PLT -- 331 310  APTT -- -- --  LABPROT -- -- --  INR -- -- --  HEPARINUNFRC -- -- --  CREATININE -- 0.66 0.78  CKTOTAL 114 104 --  CKMB 1.8 1.6 --  TROPONINI <0.30 <0.30 --   Estimated Creatinine Clearance: 58.7 ml/min (by C-G formula based on Cr of 0.66).  Medical History: Past Medical History  Diagnosis Date  . Hypertension   . Diabetes mellitus   . Coronary artery disease   . Angina   . Myocardial infarction     per patient  . Stroke     per patient  . Arthritis     knee  per patient  . Depression     Assessment: 61 y.o. F admitted with CP and positive D-dimer of 11.16 and now to start full-dose lovenox while ruling out pulmonary embolus. The patient weighs 59.4 kg and received a dose of lovenox 40 mg SQ at 0930 this morning for VTE px. Given the patient's low weight -- will wait and give full dose lovenox at 1730 today (~8 hours after low-dose a.m injection was given). SCr 0.66, CrCl~55-60 ml/min.   Goal of Therapy:  Anti-Xa level of 0.6-1.2 units/ml   Plan:  1. Lovenox 60 mg SQ every 12 hours (first dose at 1730 today) 2. Will continue to monitor for any signs/symptoms of bleeding and plans to start oral anticoagulation if PE confirmed.    Alycia Rossetti, PharmD, BCPS Clinical Pharmacist Pager: [phone] 05/31/2011 1:30 PM

## 2011-05-31 NOTE — Progress Notes (Signed)
Utilization review completed. Joanne Morris 05/31/2011

## 2011-05-31 NOTE — ED Notes (Signed)
Pt unable to void at this time. 

## 2011-05-31 NOTE — H&P (Signed)
Joanne Morris is an 61 y.o. female.   Chief Complaint: Chest pain HPI: This female with history of coronary artery disease apparently had a stent 5 years ago although we cannot find the records of that, who is also a poor historian he left-sided chest pain. Pain is worst mainly with motion. If she turns to the left side is when she feels the pain. It is reproducible by pressure on the chest. Rated as 7/10 and now 4/10. No radiation no relieving factors. He has had no nausea vomiting or diarrhea. She apparently is supposed to be seen by a Cardiologist on Monday but not sure who it is. She has not seen a cardiologist since about 5 years ago. Patient has from significant risk factors for coronary artery disease. So far no EKG change or elevation of herenzymes. Past Medical History  Diagnosis Date  . Hypertension   . Diabetes mellitus   . Coronary artery disease     Past Surgical History  Procedure Date  . Coronary stent placement     History reviewed. No pertinent family history. Social History:  reports that she has quit smoking. She does not have any smokeless tobacco history on file. She reports that she does not drink alcohol. Her drug history not on file.  Allergies: No Known Allergies  Medications Prior to Admission  Medication Dose Route Frequency Provider Last Rate Last Dose  . 0.9 %  sodium chloride infusion  1,000 mL Intravenous Once Lisette Paz, PA-C   1,000 mL at 05/31/11 0038  . insulin aspart (novoLOG) injection 0-9 Units  0-9 Units Subcutaneous TID WC Elwyn Reach, MD      . morphine 4 MG/ML injection 4 mg  4 mg Intravenous Once Lisette Paz, PA-C   4 mg at 05/31/11 0038  . nitroGLYCERIN (NITROSTAT) SL tablet 0.4 mg  0.4 mg Sublingual Q5 min PRN Lisette Paz, PA-C      . ondansetron (ZOFRAN) injection 4 mg  4 mg Intravenous PRN Lisette Paz, PA-C   4 mg at 05/31/11 0038  . potassium chloride SA (K-DUR,KLOR-CON) CR tablet 40 mEq  40 mEq Oral Once Orlie Dakin, MD   40 mEq at  05/31/11 0231  . DISCONTD: 0.9 %  sodium chloride infusion  1,000 mL Intravenous Continuous Lisette Paz, PA-C       Medications Prior to Admission  Medication Sig Dispense Refill  . amLODipine (NORVASC) 5 MG tablet Take 5 mg by mouth daily.      Marland Kitchen lisinopril-hydrochlorothiazide (PRINZIDE,ZESTORETIC) 20-12.5 MG per tablet Take 1 tablet by mouth daily.      . metFORMIN (GLUCOPHAGE) 500 MG tablet Take 500 mg by mouth 2 (two) times daily with a meal.      . niacin (NIASPAN) 500 MG CR tablet Take 500 mg by mouth at bedtime.      . nitroGLYCERIN (NITROSTAT) 0.4 MG SL tablet Place 0.4 mg under the tongue every 5 (five) minutes as needed. For chest pain      . pravastatin (PRAVACHOL) 40 MG tablet Take 40 mg by mouth daily.      Marland Kitchen zolpidem (AMBIEN) 5 MG tablet Take 5 mg by mouth at bedtime as needed. For sleep        Results for orders placed during the hospital encounter of 05/31/11 (from the past 48 hour(s))  CBC     Status: Abnormal   Collection Time   05/31/11 12:25 AM      Component Value Range Comment   WBC 11.8 (*)  4.0 - 10.5 (K/uL)    RBC 3.97  3.87 - 5.11 (MIL/uL)    Hemoglobin 10.3 (*) 12.0 - 15.0 (g/dL)    HCT 31.2 (*) 36.0 - 46.0 (%)    MCV 78.6  78.0 - 100.0 (fL)    MCH 25.9 (*) 26.0 - 34.0 (pg)    MCHC 33.0  30.0 - 36.0 (g/dL)    RDW 15.0  11.5 - 15.5 (%)    Platelets 310  150 - 400 (K/uL)   COMPREHENSIVE METABOLIC PANEL     Status: Abnormal   Collection Time   05/31/11 12:25 AM      Component Value Range Comment   Sodium 140  135 - 145 (mEq/L)    Potassium 3.0 (*) 3.5 - 5.1 (mEq/L)    Chloride 99  96 - 112 (mEq/L)    CO2 26  19 - 32 (mEq/L)    Glucose, Bld 124 (*) 70 - 99 (mg/dL)    BUN 18  6 - 23 (mg/dL)    Creatinine, Ser 0.78  0.50 - 1.10 (mg/dL)    Calcium 10.3  8.4 - 10.5 (mg/dL)    Total Protein 7.3  6.0 - 8.3 (g/dL)    Albumin 4.1  3.5 - 5.2 (g/dL)    AST 20  0 - 37 (U/L)    ALT 12  0 - 35 (U/L)    Alkaline Phosphatase 50  39 - 117 (U/L)    Total Bilirubin 0.2  (*) 0.3 - 1.2 (mg/dL)    GFR calc non Af Amer 89 (*) >90 (mL/min)    GFR calc Af Amer >90  >90 (mL/min)   POCT I-STAT TROPONIN I     Status: Normal   Collection Time   05/31/11 12:33 AM      Component Value Range Comment   Troponin i, poc 0.01  0.00 - 0.08 (ng/mL)    Comment 3             Dg Chest Portable 1 View  05/31/2011  *RADIOLOGY REPORT*  Clinical Data: Chest pain and shortness of breath.  PORTABLE CHEST - 1 VIEW  Comparison: Chest radiograph performed 07/18/2010  Findings: The lungs are well-aerated.  Mild vascular congestion is noted, without significant pulmonary edema.  There is no evidence of focal opacification, pleural effusion or pneumothorax.  The cardiomediastinal silhouette is borderline normal in size.  No acute osseous abnormalities are seen.  IMPRESSION: Mild vascular congestion noted, without significant pulmonary edema.  Original Report Authenticated By: Santa Lighter, M.D.    Review of Systems  Respiratory: Positive for cough and shortness of breath. Negative for hemoptysis, sputum production and wheezing.   Cardiovascular: Positive for chest pain. Negative for orthopnea, claudication, leg swelling and PND.  All other systems reviewed and are negative.    Blood pressure 144/70, pulse 62, temperature 98.1 F (36.7 C), temperature source Oral, resp. rate 15, SpO2 97.00%. Physical Exam  Constitutional: She is oriented to person, place, and time. She appears well-developed and well-nourished.  HENT:  Head: Normocephalic and atraumatic.  Right Ear: External ear normal.  Left Ear: External ear normal.  Nose: Nose normal.  Mouth/Throat: Oropharynx is clear and moist.       Edentulous  Eyes: Conjunctivae and EOM are normal. Pupils are equal, round, and reactive to light.  Neck: Normal range of motion. Neck supple.  Cardiovascular: Normal rate, regular rhythm, normal heart sounds and intact distal pulses.   Respiratory: Effort normal and breath sounds normal.  GI:  Soft. Bowel sounds are normal.  Musculoskeletal: Normal range of motion.  Neurological: She is alert and oriented to person, place, and time. She has normal reflexes.  Skin: Skin is warm and dry.  Psychiatric: She has a normal mood and affect. Her behavior is normal. Judgment and thought content normal.     Assessment/Plan Assessment this is a there was no coronary artery disease presenting with atypical chest pain. Patient has significant risk factors for coronary artery disease which she has. This could still be a possible acute coronary syndrome even though she had negative enzymes. Plan #1 chest pain: We'll admit the patient to rule out MI. We will get cardiology consult in the morning due to patient's high risk status. She would need at least a stress test and possibly a cardiac cath if there's any enzymes positive or EKG changes. Continue his nitroglycerin, oxygen and morphine high blood pressure medications #2: Hypokalemia: We'll replete her potassium and check magnesium level #3: Hypertension: Continue home medications and titrate as needed #4 diabetes: I will hold metformin and start sliding scale insulin #5 anemia: Probably due to chronic disease. We'll check stool guaiacs and anemia panel #6 hyperlipidemia: Check lipid panel and continue statin #7 coronary artery disease: Continue [place] plan #1  Herbie Lehrmann,LAWAL 05/31/2011, 4:32 AM

## 2011-05-31 NOTE — ED Provider Notes (Signed)
Patient is extremely vague historian Complaint of anterior chest pain onset approximately 36 hours ago intermittent worse with changing positions improved with lying still. Patient had cardiac stent placed 5 years ago at [facility], has not seen a cardiologist for several years. Reportedly has appointment with cardiologist scheduled for this coming week presents tonight by EMS this chest pain became worse yesterday. On exam no distress lungs clear auscultation heart regular rate and rhythm chest pain is worse when patient sits up in bed from supine position extremities without edema skin warm dry 2 AM patient asymptomatic pain-free Assessment and light of multiple cardiac risk factors including smoking diabetes hypercholesterolemia hypertension and coronary disease will place patient on 23 hour observation telemetry unit. Spoke with Dr. Theodosia Blender from cardiology request hospitalist to evaluate patient . Cardiology to be consult at hospitalist physician's request. Spoke with Dr. Jonelle Sidle plan 23 hour observation, telemetry Diagnosis number1 atypical chest pain Diagnosis #2 hypokalemia  Orlie Dakin, MD 05/31/11 0202

## 2011-06-01 DIAGNOSIS — R079 Chest pain, unspecified: Secondary | ICD-10-CM

## 2011-06-01 LAB — COMPREHENSIVE METABOLIC PANEL
ALT: 22 U/L (ref 0–35)
AST: 34 U/L (ref 0–37)
Alkaline Phosphatase: 54 U/L (ref 39–117)
Calcium: 9.7 mg/dL (ref 8.4–10.5)
GFR calc Af Amer: >90 mL/min (ref >90)
Glucose, Bld: 114 mg/dL — ABNORMAL HIGH (ref 70–99)
Potassium: 3.8 mEq/L (ref 3.5–5.1)
Sodium: 137 mEq/L (ref 135–145)
Total Protein: 6.9 g/dL (ref 6.0–8.3)

## 2011-06-01 LAB — FOLATE: Folate: >20 ng/mL

## 2011-06-01 LAB — CARDIAC PANEL(CRET KIN+CKTOT+MB+TROPI)
CK, MB: 1.3 ng/mL (ref 0.3–4.0)
Troponin I: <0.3 ng/mL (ref <0.30)

## 2011-06-01 LAB — IRON AND TIBC
Iron: 34 ug/dL — ABNORMAL LOW (ref 42–135)
Saturation Ratios: 13 % — ABNORMAL LOW (ref 20–55)
TIBC: 252 ug/dL (ref 250–470)

## 2011-06-01 LAB — VITAMIN B12: Vitamin B-12: 411 pg/mL (ref 211–911)

## 2011-06-01 LAB — CBC
HCT: 30.5 % — ABNORMAL LOW (ref 36.0–46.0)
Hemoglobin: 10.1 g/dL — ABNORMAL LOW (ref 12.0–15.0)
RBC: 3.92 MIL/uL (ref 3.87–5.11)
RDW: 14.8 % (ref 11.5–15.5)
WBC: 8.5 10*3/uL (ref 4.0–10.5)

## 2011-06-01 LAB — GLUCOSE, CAPILLARY
Glucose-Capillary: 101 mg/dL — ABNORMAL HIGH (ref 70–99)
Glucose-Capillary: 137 mg/dL — ABNORMAL HIGH (ref 70–99)

## 2011-06-01 LAB — RETICULOCYTES: Retic Ct Pct: 1.2 % (ref 0.4–3.1)

## 2011-06-01 MED ORDER — ENOXAPARIN SODIUM 40 MG/0.4ML ~~LOC~~ SOLN
40.0000 mg | SUBCUTANEOUS | Status: DC
  Administered 2011-06-02 – 2011-06-03 (×2): 40 mg via SUBCUTANEOUS
  Filled 2011-06-01 (×3): qty 0.4

## 2011-06-01 MED ORDER — PANTOPRAZOLE SODIUM 40 MG IV SOLR
40.0000 mg | INTRAVENOUS | Status: DC
Start: 2011-06-01 — End: 2011-06-03
  Administered 2011-06-01 – 2011-06-02 (×2): 40 mg via INTRAVENOUS
  Filled 2011-06-01 (×3): qty 40

## 2011-06-01 NOTE — Progress Notes (Signed)
SUBJECTIVE:  Still complains of chest pain but improves with hydrocodone  OBJECTIVE:   Vitals:   Filed Vitals:   05/31/11 1058 05/31/11 1400 05/31/11 2100 06/01/11 0634  BP: 168/79 153/73 130/65 140/60  Pulse: 65 56 62 61  Temp:  98.6 F (37 C) 98.4 F (36.9 C) 98.3 F (36.8 C)  TempSrc:  Oral  Oral  Resp:  18 18 16   Height:      Weight:      SpO2:  99% 100% 99%   I&O's:   Intake/Output Summary (Last 24 hours) at 06/01/11 1040 Last data filed at 06/01/11 0900  Gross per 24 hour  Intake    240 ml  Output      0 ml  Net    240 ml   TELEMETRY: Reviewed telemetry pt in NSR     PHYSICAL EXAM General: Well developed, well nourished, in no acute distress Lungs:   Clear bilaterally to auscultation and percussion. Heart:   HRRR S1 S2 Pulses are 2+ & equal. Abdomen: Bowel sounds are positive, abdomen soft and non-tender without masses  Extremities:   No clubbing, cyanosis or edema.  DP +1 Neuro: Alert and oriented X 3. Psych:  Good affect, responds appropriately   LABS: Basic Metabolic Panel:  Basename 06/01/11 0028 05/31/11 0703 05/31/11 0025  NA 137 -- 140  K 3.8 -- 3.0*  CL 100 -- 99  CO2 25 -- 26  GLUCOSE 114* -- 124*  BUN 9 -- 18  CREATININE 0.66 0.66 --  CALCIUM 9.7 -- 10.3  MG -- 1.7 --  PHOS -- -- --   Liver Function Tests:  Basename 06/01/11 0028 05/31/11 0025  AST 34 20  ALT 22 12  ALKPHOS 54 50  BILITOT 0.2* 0.2*  PROT 6.9 7.3  ALBUMIN 3.9 4.1   No results found for this basename: LIPASE:2,AMYLASE:2 in the last 72 hours CBC:  Basename 06/01/11 0028 05/31/11 0703  WBC 8.5 9.9  NEUTROABS -- --  HGB 10.1* 10.1*  HCT 30.5* 31.4*  MCV 77.8* 78.7  PLT 301 331   Cardiac Enzymes:  Basename 06/01/11 0029 05/31/11 1752 05/31/11 1125  CKTOTAL 89 104 114  CKMB 1.3 1.5 1.8  CKMBINDEX -- -- --  TROPONINI <0.30 <0.30 <0.30   BNP: No components found with this basename: POCBNP:3 D-Dimer:  Basename 05/31/11 1125  DDIMER 11.16*   Hemoglobin  A1C:  Basename 05/31/11 0353  HGBA1C 5.6   Fasting Lipid Panel:  Basename 05/31/11 0458  CHOL 152  HDL 42  LDLCALC 72  TRIG 190*  CHOLHDL 3.6  LDLDIRECT --   Thyroid Function Tests:  Basename 05/31/11 0703  TSH 3.514  T4TOTAL --  T3FREE --  THYROIDAB --   Anemia Panel:  Basename 05/31/11 0457  VITAMINB12 456  FOLATE >20.0  FERRITIN 198  TIBC 221*  IRON 29*  RETICCTPCT 1.2   Coag Panel:   No results found for this basename: INR, PROTIME    RADIOLOGY: Ct Angio Chest W/cm &/or Wo Cm  05/31/2011  *RADIOLOGY REPORT*  Clinical Data: Elevated D-dimer.  History of coronary artery disease.  Diabetes.  Hypertension.  Stroke.  CT ANGIOGRAPHY CHEST  Technique:  Multidetector CT imaging of the chest using the standard protocol during bolus administration of intravenous contrast. Multiplanar reconstructed images including MIPs were obtained and reviewed to evaluate the vascular anatomy.  Contrast: 90mL OMNIPAQUE IOHEXOL 350 MG/ML SOLN  Comparison: Plain film of 05/31/2011.  Prior CT of 08/18/2009.  Findings: Lung windows demonstrate  mild motion degradation, including above the carina and at the lung bases.  Volume loss or scarring at the inferior right middle lobe.  Scarring or atelectasis also involves the left lung base.  Soft tissue windows:  The quality of this exam for evaluation of pulmonary embolism is good.  The bolus is well timed.  The only limitation is the minimal above described motion artifact. No evidence of pulmonary embolism.  Aorta normal in caliber.  Not well opacified to evaluate for dissection.  Mild cardiomegaly. Multivessel coronary artery atherosclerosis.  No pericardial or pleural effusion.  No mediastinal or hilar adenopathy.  Small retrocrural nodes are unchanged.  Limited abdominal imaging demonstrates no significant findings. Moderate thoracic spondylosis.  IMPRESSION:  1. No evidence of pulmonary embolism.  Mildly motion degraded exam. 2.  Cardiomegaly with age  advanced coronary artery atherosclerosis.  Original Report Authenticated By: Areta Haber, M.D.   Dg Chest Portable 1 View  05/31/2011  *RADIOLOGY REPORT*  Clinical Data: Chest pain and shortness of breath.  PORTABLE CHEST - 1 VIEW  Comparison: Chest radiograph performed 07/18/2010  Findings: The lungs are well-aerated.  Mild vascular congestion is noted, without significant pulmonary edema.  There is no evidence of focal opacification, pleural effusion or pneumothorax.  The cardiomediastinal silhouette is borderline normal in size.  No acute osseous abnormalities are seen.  IMPRESSION: Mild vascular congestion noted, without significant pulmonary edema.  Original Report Authenticated By: Santa Lighter, M.D.      ASSESSMENT:  1.  Atypical chest pain improved with GI cocktail and hydrocodone with negative cardiac enzymes despite constant chest pain for 48 hours - unlikely to be ACS 2.  CAD s/p remote PCI in 2008 3.  HTN 4.  DM 5.  Anemia 6.  Hypokalemia repleted  PLAN:   1.  Will try to get old records from [facility] 2.  Recommend GI workup of epigastric pain and anemia with weight loss  Sueanne Margarita, MD  06/01/2011  10:40 AM

## 2011-06-01 NOTE — Progress Notes (Signed)
*  PRELIMINARY RESULTS* Vascular Ultrasound Lower extremity venous duplex has been completed.  Preliminary findings: Bilaterally no evidence of DVT or baker's cyst.  Landry Mellow, RDMS 06/01/2011, 9:40 AM

## 2011-06-01 NOTE — Progress Notes (Signed)
Subjective: Patient seen  and examined, still complains of chest pain on movement. Cardiology has seen and recommend GI work up.  Objective: Vital signs in last 24 hours: Temp:  [98.3 F (36.8 C)-98.6 F (37 C)] 98.3 F (36.8 C) (04/20 0634) Pulse Rate:  [56-62] 61  (04/20 0634) Resp:  [16-18] 16  (04/20 0634) BP: (130-153)/(60-81) 146/81 mmHg (04/20 1100) SpO2:  [99 %-100 %] 99 % (04/20 0634) Weight change:  Last BM Date: 05/31/11  Intake/Output from previous day:   Total I/O In: 240 [P.O.:240] Out: -    Physical Exam: HEENT: Atraumatic, normocephalic Neck: Supple Chest : Clear to auscultation bilaterally, no wheezing, no crackles Heart : S1 S2 Regular, no murmurs Abdomen: Soft, Nontender, no organomegaly Ext : No cyanosis, clubbing, edema   Lab Results: Basic Metabolic Panel:  Basename 06/01/11 0028 05/31/11 0703 05/31/11 0025  NA 137 -- 140  K 3.8 -- 3.0*  CL 100 -- 99  CO2 25 -- 26  GLUCOSE 114* -- 124*  BUN 9 -- 18  CREATININE 0.66 0.66 --  CALCIUM 9.7 -- 10.3  MG -- 1.7 --  PHOS -- -- --   Liver Function Tests:  Basename 06/01/11 0028 05/31/11 0025  AST 34 20  ALT 22 12  ALKPHOS 54 50  BILITOT 0.2* 0.2*  PROT 6.9 7.3  ALBUMIN 3.9 4.1   No results found for this basename: LIPASE:2,AMYLASE:2 in the last 72 hours No results found for this basename: AMMONIA:2 in the last 72 hours CBC:  Basename 06/01/11 0028 05/31/11 0703  WBC 8.5 9.9  NEUTROABS -- --  HGB 10.1* 10.1*  HCT 30.5* 31.4*  MCV 77.8* 78.7  PLT 301 331   Cardiac Enzymes:  Basename 06/01/11 0029 05/31/11 1752 05/31/11 1125  CKTOTAL 89 104 114  CKMB 1.3 1.5 1.8  CKMBINDEX -- -- --  TROPONINI <0.30 <0.30 <0.30   BNP:  Basename 05/31/11 0703  PROBNP 46.5   D-Dimer:  Basename 05/31/11 1125  DDIMER 11.16*   CBG:  Basename 06/01/11 1201 06/01/11 0736 05/31/11 2057 05/31/11 1657 05/31/11 1148 05/31/11 0741  GLUCAP 101* 113* 109* 112* 93 108*   Hemoglobin A1C:  Basename  05/31/11 0353  HGBA1C 5.6   Fasting Lipid Panel:  Basename 05/31/11 0458  CHOL 152  HDL 42  LDLCALC 72  TRIG 190*  CHOLHDL 3.6  LDLDIRECT --   Thyroid Function Tests:  Basename 05/31/11 0703  TSH 3.514  T4TOTAL --  FREET4 --  T3FREE --  THYROIDAB --   Anemia Panel:  Basename 05/31/11 0457  VITAMINB12 456  FOLATE >20.0  FERRITIN 198  TIBC 221*  IRON 29*  RETICCTPCT 1.2   Coagulation: No results found for this basename: LABPROT:2,INR:2 in the last 72 hours Urine Drug Screen: Drugs of Abuse  No results found for this basename: labopia, cocainscrnur, labbenz, amphetmu, thcu, labbarb    Alcohol Level: No results found for this basename: ETH:2 in the last 72 hours Urinalysis: No results found for this basename: COLORURINE:2,APPERANCEUR:2,LABSPEC:2,PHURINE:2,GLUCOSEU:2,HGBUR:2,BILIRUBINUR:2,KETONESUR:2,PROTEINUR:2,UROBILINOGEN:2,NITRITE:2,LEUKOCYTESUR:2 in the last 72 hours Misc. Labs:  No results found for this or any previous visit (from the past 240 hour(s)).  Studies/Results: Ct Angio Chest W/cm &/or Wo Cm  05/31/2011  *RADIOLOGY REPORT*  Clinical Data: Elevated D-dimer.  History of coronary artery disease.  Diabetes.  Hypertension.  Stroke.  CT ANGIOGRAPHY CHEST  Technique:  Multidetector CT imaging of the chest using the standard protocol during bolus administration of intravenous contrast. Multiplanar reconstructed images including MIPs were obtained and reviewed  to evaluate the vascular anatomy.  Contrast: 16mL OMNIPAQUE IOHEXOL 350 MG/ML SOLN  Comparison: Plain film of 05/31/2011.  Prior CT of 08/18/2009.  Findings: Lung windows demonstrate mild motion degradation, including above the carina and at the lung bases.  Volume loss or scarring at the inferior right middle lobe.  Scarring or atelectasis also involves the left lung base.  Soft tissue windows:  The quality of this exam for evaluation of pulmonary embolism is good.  The bolus is well timed.  The only  limitation is the minimal above described motion artifact. No evidence of pulmonary embolism.  Aorta normal in caliber.  Not well opacified to evaluate for dissection.  Mild cardiomegaly. Multivessel coronary artery atherosclerosis.  No pericardial or pleural effusion.  No mediastinal or hilar adenopathy.  Small retrocrural nodes are unchanged.  Limited abdominal imaging demonstrates no significant findings. Moderate thoracic spondylosis.  IMPRESSION:  1. No evidence of pulmonary embolism.  Mildly motion degraded exam. 2.  Cardiomegaly with age advanced coronary artery atherosclerosis.  Original Report Authenticated By: Areta Haber, M.D.   Dg Chest Portable 1 View  05/31/2011  *RADIOLOGY REPORT*  Clinical Data: Chest pain and shortness of breath.  PORTABLE CHEST - 1 VIEW  Comparison: Chest radiograph performed 07/18/2010  Findings: The lungs are well-aerated.  Mild vascular congestion is noted, without significant pulmonary edema.  There is no evidence of focal opacification, pleural effusion or pneumothorax.  The cardiomediastinal silhouette is borderline normal in size.  No acute osseous abnormalities are seen.  IMPRESSION: Mild vascular congestion noted, without significant pulmonary edema.  Original Report Authenticated By: Santa Lighter, M.D.    Medications: Scheduled Meds:   . aspirin EC  325 mg Oral Daily  . enoxaparin (LOVENOX) injection  60 mg Subcutaneous Q12H  . lisinopril  20 mg Oral Daily   And  . hydrochlorothiazide  12.5 mg Oral Daily  . insulin aspart  0-9 Units Subcutaneous TID WC  . metoprolol tartrate  25 mg Oral BID  . mulitivitamin with minerals  1 tablet Oral Daily  . nitroGLYCERIN  0.2 mg Transdermal Daily  . simvastatin  5 mg Oral q1800  . sodium chloride  3 mL Intravenous Q12H  . DISCONTD: enoxaparin  40 mg Subcutaneous Daily  . DISCONTD: niacin  500 mg Oral QHS   Continuous Infusions:   . sodium chloride 75 mL/hr at 05/31/11 1142   PRN Meds:.acetaminophen,  acetaminophen, HYDROcodone-acetaminophen, iohexol, morphine, nitroGLYCERIN, ondansetron (ZOFRAN) IV, ondansetron, senna-docusate, zolpidem  Assessment/Plan:  ? GERD Will start IV Protonix. D/W Dr Benson Norway, he can follow up the patient as outpatient, for possible EGD. Will d/c Niacin as it can cause GI upset.  CAD (coronary artery disease) Continue apirin , Metoprolol.  HTN (hypertension) Cont HCTZ, lisinopril.   Anemia Will obtain Anemia panel EGD as outpatient  DM2 (diabetes mellitus, type 2) SSI  Hyperlipemia Continue Simvastatin Will d/c the Niacin as it can cause GI side effects  DVT prophylaxis Lovenox   LOS: 1 day   [facility] Pager: [phone] 06/01/2011, 1:18 PM

## 2011-06-02 LAB — GLUCOSE, CAPILLARY
Glucose-Capillary: 141 mg/dL — ABNORMAL HIGH (ref 70–99)
Glucose-Capillary: 82 mg/dL (ref 70–99)
Glucose-Capillary: 113 mg/dL — ABNORMAL HIGH (ref 70–99)
Glucose-Capillary: 114 mg/dL — ABNORMAL HIGH (ref 70–99)

## 2011-06-02 MED ORDER — METOPROLOL TARTRATE 25 MG PO TABS
25.0000 mg | ORAL_TABLET | Freq: Two times a day (BID) | ORAL | Status: DC
  Administered 2011-06-02 – 2011-06-03 (×2): 25 mg via ORAL
  Filled 2011-06-02 (×4): qty 1

## 2011-06-02 NOTE — Consult Note (Signed)
Reason for Consult: Noncardiac chest pain Referring Physician: [facility]  Trixie Rude HPI: This is a 61 year old female who was admitted with chest pain.  She has a history of CAD s/p stent placement, however, the current work up is negative for any cardiac cause for her chest pain.  She reports having a long history of this pain, but it markedly worsened on the day of admission.  The pain only occurs with movement, i.e., turning from side to side in a supine position.  It is not constant, but when she has an attack of the pain it can be quite severe.  During this hospitalization she was also noted to have an anemia and in the past she was checked for her iron status, which was mildly decreased.  The iron saturation was at 13%.  Many years ago she had a colonoscopy, but she cannot remember the reason.  No reports of overt GERD and she denies any problems with dysphagia or odynophagia.  She reports a 15 lbs weight loss since coming to [place] 2 years and 2 months ago.  The patient cannot explain her weight loss and it was not intentional.  Past Medical History  Diagnosis Date  . Hypertension   . Diabetes mellitus   . Coronary artery disease   . Angina   . Myocardial infarction     per patient  . Stroke     per patient  . Arthritis     knee  per patient  . Depression     Past Surgical History  Procedure Date  . Coronary stent placement   . Abdominal hysterectomy   . Tubal ligation     History reviewed. No pertinent family history.  Social History:  reports that she quit smoking about 2 years ago. She has never used smokeless tobacco. She reports that she does not drink alcohol or use illicit drugs.  Allergies: No Known Allergies  Medications:  Scheduled:   . aspirin EC  325 mg Oral Daily  . enoxaparin (LOVENOX) injection  40 mg Subcutaneous Q24H  . lisinopril  20 mg Oral Daily   And  . hydrochlorothiazide  12.5 mg Oral Daily  . insulin aspart  0-9 Units  Subcutaneous TID WC  . metoprolol tartrate  25 mg Oral BID  . metoprolol tartrate  25 mg Oral BID  . mulitivitamin with minerals  1 tablet Oral Daily  . nitroGLYCERIN  0.2 mg Transdermal Daily  . pantoprazole (PROTONIX) IV  40 mg Intravenous Q24H  . simvastatin  5 mg Oral q1800  . sodium chloride  3 mL Intravenous Q12H  . DISCONTD: enoxaparin (LOVENOX) injection  60 mg Subcutaneous Q12H  . DISCONTD: niacin  500 mg Oral QHS   Continuous:   . sodium chloride 75 mL/hr at 06/02/11 0606    Results for orders placed during the hospital encounter of 05/31/11 (from the past 24 hour(s))  GLUCOSE, CAPILLARY     Status: Abnormal   Collection Time   06/01/11 12:01 PM      Component Value Range   Glucose-Capillary 101 (*) 70 - 99 (mg/dL)  VITAMIN B12     Status: Normal   Collection Time   06/01/11  2:16 PM      Component Value Range   Vitamin B-12 411  211 - 911 (pg/mL)  FOLATE     Status: Normal   Collection Time   06/01/11  2:16 PM      Component Value Range   Folate >  20.0    IRON AND TIBC     Status: Abnormal   Collection Time   06/01/11  2:16 PM      Component Value Range   Iron 34 (*) 42 - 135 (ug/dL)   TIBC 252  250 - 470 (ug/dL)   Saturation Ratios 13 (*) 20 - 55 (%)   UIBC 218  125 - 400 (ug/dL)  FERRITIN     Status: Normal   Collection Time   06/01/11  2:16 PM      Component Value Range   Ferritin 231  10 - 291 (ng/mL)  RETICULOCYTES     Status: Normal   Collection Time   06/01/11  2:16 PM      Component Value Range   Retic Ct Pct 1.2  0.4 - 3.1 (%)   RBC. 4.03  3.87 - 5.11 (MIL/uL)   Retic Count, Manual 48.4  19.0 - 186.0 (K/uL)  GLUCOSE, CAPILLARY     Status: Normal   Collection Time   06/01/11  5:01 PM      Component Value Range   Glucose-Capillary 95  70 - 99 (mg/dL)  GLUCOSE, CAPILLARY     Status: Abnormal   Collection Time   06/01/11  9:18 PM      Component Value Range   Glucose-Capillary 137 (*) 70 - 99 (mg/dL)  GLUCOSE, CAPILLARY     Status: Abnormal    Collection Time   06/02/11  7:39 AM      Component Value Range   Glucose-Capillary 141 (*) 70 - 99 (mg/dL)     Ct Angio Chest W/cm &/or Wo Cm  05/31/2011  *RADIOLOGY REPORT*  Clinical Data: Elevated D-dimer.  History of coronary artery disease.  Diabetes.  Hypertension.  Stroke.  CT ANGIOGRAPHY CHEST  Technique:  Multidetector CT imaging of the chest using the standard protocol during bolus administration of intravenous contrast. Multiplanar reconstructed images including MIPs were obtained and reviewed to evaluate the vascular anatomy.  Contrast: 63mL OMNIPAQUE IOHEXOL 350 MG/ML SOLN  Comparison: Plain film of 05/31/2011.  Prior CT of 08/18/2009.  Findings: Lung windows demonstrate mild motion degradation, including above the carina and at the lung bases.  Volume loss or scarring at the inferior right middle lobe.  Scarring or atelectasis also involves the left lung base.  Soft tissue windows:  The quality of this exam for evaluation of pulmonary embolism is good.  The bolus is well timed.  The only limitation is the minimal above described motion artifact. No evidence of pulmonary embolism.  Aorta normal in caliber.  Not well opacified to evaluate for dissection.  Mild cardiomegaly. Multivessel coronary artery atherosclerosis.  No pericardial or pleural effusion.  No mediastinal or hilar adenopathy.  Small retrocrural nodes are unchanged.  Limited abdominal imaging demonstrates no significant findings. Moderate thoracic spondylosis.  IMPRESSION:  1. No evidence of pulmonary embolism.  Mildly motion degraded exam. 2.  Cardiomegaly with age advanced coronary artery atherosclerosis.  Original Report Authenticated By: Areta Haber, M.D.    ROS:  As stated above in the HPI otherwise negative.  Blood pressure 180/84, pulse 70, temperature 98 F (36.7 C), temperature source Oral, resp. rate 18, height 4\' 11"  (1.499 m), weight 59.421 kg (131 lb), SpO2 100.00%.    PE: Gen: NAD, Alert and Oriented HEENT:   [place]/AT, EOMI Neck: Supple, no LAD Lungs: CTA Bilaterally Chest: reproducible right costosternal pain CV: RRR without M/G/R ABM: Soft, NTND, +BS Ext: No C/C/E  Assessment/Plan: 1) Costochondritis. 2)  IDA. 3) CAD. 4) Weight loss - ? Etiology.   It is clear to me clinically that she has costochondritis.  Her pain is reproducible with certain positions of her arms and palpation of the right costosternal joints.  In this instance patients typically respond well to a course of a Medrol Dose Pack x 6 days.  As for her anemia further evaluation is required, but it can on an outpatient basis.    Plan: 1) Continue with myoview per Cardiology. 2) Medrol Dose Pack x 6 days upon completion of myoview. 3) Follow up in the office to schedule an EGD/Colonoscopy.  Jhoselin Crume D 06/02/2011, 11:11 AM

## 2011-06-02 NOTE — Progress Notes (Signed)
Subjective: Patient seen  and examined, still complains of chest pain on movement. Stress myoview in am.  Objective: Vital signs in last 24 hours: Temp:  [98 F (36.7 C)-98.5 F (36.9 C)] 98 F (36.7 C) (04/21 0500) Pulse Rate:  [61-70] 70  (04/21 0500) Resp:  [18] 18  (04/21 0500) BP: (146-180)/(68-84) 180/84 mmHg (04/21 0956) SpO2:  [100 %] 100 % (04/21 0500) Weight change:  Last BM Date: 05/31/11  Intake/Output from previous day: 04/20 0701 - 04/21 0700 In: 240 [P.O.:240] Out: -      Physical Exam: HEENT: Atraumatic, normocephalic Neck: Supple Chest : Clear to auscultation bilaterally, no wheezing, no crackles Heart : S1 S2 Regular, no murmurs Abdomen: Soft, Nontender, no organomegaly Ext : No cyanosis, clubbing, edema   Lab Results: Basic Metabolic Panel:  Basename 06/01/11 0028 05/31/11 0703 05/31/11 0025  NA 137 -- 140  K 3.8 -- 3.0*  CL 100 -- 99  CO2 25 -- 26  GLUCOSE 114* -- 124*  BUN 9 -- 18  CREATININE 0.66 0.66 --  CALCIUM 9.7 -- 10.3  MG -- 1.7 --  PHOS -- -- --   Liver Function Tests:  [facility] 06/01/11 0028 05/31/11 0025  AST 34 20  ALT 22 12  ALKPHOS 54 50  BILITOT 0.2* 0.2*  PROT 6.9 7.3  ALBUMIN 3.9 4.1   No results found for this basename: LIPASE:2,AMYLASE:2 in the last 72 hours No results found for this basename: AMMONIA:2 in the last 72 hours CBC:  Basename 06/01/11 0028 05/31/11 0703  WBC 8.5 9.9  NEUTROABS -- --  HGB 10.1* 10.1*  HCT 30.5* 31.4*  MCV 77.8* 78.7  PLT 301 331   Cardiac Enzymes:  Basename 06/01/11 0029 05/31/11 1752 05/31/11 1125  CKTOTAL 89 104 114  CKMB 1.3 1.5 1.8  CKMBINDEX -- -- --  TROPONINI <0.30 <0.30 <0.30   BNP:  Basename 05/31/11 0703  PROBNP 46.5   D-Dimer:  Basename 05/31/11 1125  DDIMER 11.16*   CBG:  Basename 06/02/11 0739 06/01/11 2118 06/01/11 1701 06/01/11 1201 06/01/11 0736 05/31/11 2057  GLUCAP 141* 137* 95 101* 113* 109*   Hemoglobin A1C:  Basename 05/31/11 0353    HGBA1C 5.6   Fasting Lipid Panel:  Basename 05/31/11 0458  CHOL 152  HDL 42  LDLCALC 72  TRIG 190*  CHOLHDL 3.6  LDLDIRECT --   Thyroid Function Tests:  Basename 05/31/11 0703  TSH 3.514  T4TOTAL --  FREET4 --  T3FREE --  THYROIDAB --   Anemia Panel:  Basename 06/01/11 1416  VITAMINB12 411  FOLATE >20.0  FERRITIN 231  TIBC 252  IRON 34*  RETICCTPCT 1.2   Coagulation: No results found for this basename: LABPROT:2,INR:2 in the last 72 hours Urine Drug Screen: Drugs of Abuse  No results found for this basename: labopia,  cocainscrnur,  labbenz,  amphetmu,  thcu,  labbarb    Alcohol Level: No results found for this basename: ETH:2 in the last 72 hours Urinalysis: No results found for this basename: COLORURINE:2,APPERANCEUR:2,LABSPEC:2,PHURINE:2,GLUCOSEU:2,HGBUR:2,BILIRUBINUR:2,KETONESUR:2,PROTEINUR:2,UROBILINOGEN:2,NITRITE:2,LEUKOCYTESUR:2 in the last 72 hours Misc. Labs:  No results found for this or any previous visit (from the past 240 hour(s)).  Studies/Results: Ct Angio Chest W/cm &/or Wo Cm  05/31/2011  *RADIOLOGY REPORT*  Clinical Data: Elevated D-dimer.  History of coronary artery disease.  Diabetes.  Hypertension.  Stroke.  CT ANGIOGRAPHY CHEST  Technique:  Multidetector CT imaging of the chest using the standard protocol during bolus administration of intravenous contrast. Multiplanar reconstructed images including MIPs  were obtained and reviewed to evaluate the vascular anatomy.  Contrast: 6mL OMNIPAQUE IOHEXOL 350 MG/ML SOLN  Comparison: Plain film of 05/31/2011.  Prior CT of 08/18/2009.  Findings: Lung windows demonstrate mild motion degradation, including above the carina and at the lung bases.  Volume loss or scarring at the inferior right middle lobe.  Scarring or atelectasis also involves the left lung base.  Soft tissue windows:  The quality of this exam for evaluation of pulmonary embolism is good.  The bolus is well timed.  The only limitation is the  minimal above described motion artifact. No evidence of pulmonary embolism.  Aorta normal in caliber.  Not well opacified to evaluate for dissection.  Mild cardiomegaly. Multivessel coronary artery atherosclerosis.  No pericardial or pleural effusion.  No mediastinal or hilar adenopathy.  Small retrocrural nodes are unchanged.  Limited abdominal imaging demonstrates no significant findings. Moderate thoracic spondylosis.  IMPRESSION:  1. No evidence of pulmonary embolism.  Mildly motion degraded exam. 2.  Cardiomegaly with age advanced coronary artery atherosclerosis.  Original Report Authenticated By: Areta Haber, M.D.    Medications: Scheduled Meds:    . aspirin EC  325 mg Oral Daily  . enoxaparin (LOVENOX) injection  40 mg Subcutaneous Q24H  . lisinopril  20 mg Oral Daily   And  . hydrochlorothiazide  12.5 mg Oral Daily  . insulin aspart  0-9 Units Subcutaneous TID WC  . metoprolol tartrate  25 mg Oral BID  . metoprolol tartrate  25 mg Oral BID  . mulitivitamin with minerals  1 tablet Oral Daily  . nitroGLYCERIN  0.2 mg Transdermal Daily  . pantoprazole (PROTONIX) IV  40 mg Intravenous Q24H  . simvastatin  5 mg Oral q1800  . sodium chloride  3 mL Intravenous Q12H  . DISCONTD: enoxaparin (LOVENOX) injection  60 mg Subcutaneous Q12H  . DISCONTD: niacin  500 mg Oral QHS   Continuous Infusions:    . sodium chloride 75 mL/hr at 06/02/11 0606   PRN Meds:.acetaminophen, acetaminophen, HYDROcodone-acetaminophen, morphine, nitroGLYCERIN, ondansetron (ZOFRAN) IV, ondansetron, senna-docusate, zolpidem  Assessment/Plan:  ? GERD Will start IV Protonix. Called Dr Benson Norway, who will see the patient.  CAD (coronary artery disease) Continue apirin , Metoprolol.  HTN (hypertension) Cont HCTZ, lisinopril.  Anemia Anemia panel shows mild iron deficiency. Will need po ferrous sulfate at discharge, Will wait for GI recommendations.   DM2 (diabetes mellitus, type  2) SSI  Hyperlipemia Continue Simvastatin Will d/c the Niacin as it can cause GI side effects  DVT prophylaxis Lovenox   LOS: 2 days   [facility] Pager: [phone] 06/02/2011, 10:46 AM

## 2011-06-02 NOTE — Progress Notes (Signed)
SUBJECTIVE:  Had more pain yesterday which was severe in the midsternal region.  Started on PPI  OBJECTIVE:   Vitals:   Filed Vitals:   06/01/11 1100 06/01/11 2100 06/01/11 2140 06/02/11 0500  BP: 146/81 162/78 151/68 168/72  Pulse:  61  70  Temp:  98.5 F (36.9 C)  98 F (36.7 C)  TempSrc:  Oral  Oral  Resp:  18  18  Height:      Weight:      SpO2:  100%  100%   I&O's:  No intake or output data in the 24 hours ending 06/02/11 0945 TELEMETRY: Reviewed telemetry pt in NSR     PHYSICAL EXAM General: Well developed, well nourished, in no acute distress Head: Eyes PERRLA, No xanthomas.   Normal cephalic and atramatic  Lungs:   Clear bilaterally to auscultation and percussion. Heart:   HRRR S1 S2 Pulses are 2+ & equal. Abdomen: Bowel sounds are positive, abdomen soft and non-tender without masses  Extremities:   No clubbing, cyanosis or edema.  DP +1 Neuro: Alert and oriented X 3. Psych:  Good affect, responds appropriately   LABS: Basic Metabolic Panel:  Basename 06/01/11 0028 05/31/11 0703 05/31/11 0025  NA 137 -- 140  K 3.8 -- 3.0*  CL 100 -- 99  CO2 25 -- 26  GLUCOSE 114* -- 124*  BUN 9 -- 18  CREATININE 0.66 0.66 --  CALCIUM 9.7 -- 10.3  MG -- 1.7 --  PHOS -- -- --   Liver Function Tests:  Basename 06/01/11 0028 05/31/11 0025  AST 34 20  ALT 22 12  ALKPHOS 54 50  BILITOT 0.2* 0.2*  PROT 6.9 7.3  ALBUMIN 3.9 4.1   No results found for this basename: LIPASE:2,AMYLASE:2 in the last 72 hours CBC:  Basename 06/01/11 0028 05/31/11 0703  WBC 8.5 9.9  NEUTROABS -- --  HGB 10.1* 10.1*  HCT 30.5* 31.4*  MCV 77.8* 78.7  PLT 301 331   Cardiac Enzymes:  Basename 06/01/11 0029 05/31/11 1752 05/31/11 1125  CKTOTAL 89 104 114  CKMB 1.3 1.5 1.8  CKMBINDEX -- -- --  TROPONINI <0.30 <0.30 <0.30   BNP: No components found with this basename: POCBNP:3 D-Dimer:  Basename 05/31/11 1125  DDIMER 11.16*   Hemoglobin A1C:  Basename 05/31/11 0353  HGBA1C  5.6   Fasting Lipid Panel:  Basename 05/31/11 0458  CHOL 152  HDL 42  LDLCALC 72  TRIG 190*  CHOLHDL 3.6  LDLDIRECT --   Thyroid Function Tests:  Basename 05/31/11 0703  TSH 3.514  T4TOTAL --  T3FREE --  THYROIDAB --   Anemia Panel:  Basename 06/01/11 1416  VITAMINB12 411  FOLATE >20.0  FERRITIN 231  TIBC 252  IRON 34*  RETICCTPCT 1.2   Coag Panel:   No results found for this basename: INR, PROTIME    RADIOLOGY: Ct Angio Chest W/cm &/or Wo Cm  05/31/2011  *RADIOLOGY REPORT*  Clinical Data: Elevated D-dimer.  History of coronary artery disease.  Diabetes.  Hypertension.  Stroke.  CT ANGIOGRAPHY CHEST  Technique:  Multidetector CT imaging of the chest using the standard protocol during bolus administration of intravenous contrast. Multiplanar reconstructed images including MIPs were obtained and reviewed to evaluate the vascular anatomy.  Contrast: 22mL OMNIPAQUE IOHEXOL 350 MG/ML SOLN  Comparison: Plain film of 05/31/2011.  Prior CT of 08/18/2009.  Findings: Lung windows demonstrate mild motion degradation, including above the carina and at the lung bases.  Volume loss or scarring at  the inferior right middle lobe.  Scarring or atelectasis also involves the left lung base.  Soft tissue windows:  The quality of this exam for evaluation of pulmonary embolism is good.  The bolus is well timed.  The only limitation is the minimal above described motion artifact. No evidence of pulmonary embolism.  Aorta normal in caliber.  Not well opacified to evaluate for dissection.  Mild cardiomegaly. Multivessel coronary artery atherosclerosis.  No pericardial or pleural effusion.  No mediastinal or hilar adenopathy.  Small retrocrural nodes are unchanged.  Limited abdominal imaging demonstrates no significant findings. Moderate thoracic spondylosis.  IMPRESSION:  1. No evidence of pulmonary embolism.  Mildly motion degraded exam. 2.  Cardiomegaly with age advanced coronary artery atherosclerosis.   Original Report Authenticated By: Areta Haber, M.D.   Dg Chest Portable 1 View  05/31/2011  *RADIOLOGY REPORT*  Clinical Data: Chest pain and shortness of breath.  PORTABLE CHEST - 1 VIEW  Comparison: Chest radiograph performed 07/18/2010  Findings: The lungs are well-aerated.  Mild vascular congestion is noted, without significant pulmonary edema.  There is no evidence of focal opacification, pleural effusion or pneumothorax.  The cardiomediastinal silhouette is borderline normal in size.  No acute osseous abnormalities are seen.  IMPRESSION: Mild vascular congestion noted, without significant pulmonary edema.  Original Report Authenticated By: Santa Lighter, M.D.      ASSESSMENT:  1. Atypical chest pain improved with GI cocktail and hydrocodone with negative cardiac enzymes despite constant chest pain for 48 hours - unlikely to be ACS but chest Ct done for elevated ddimer showed advanced coronary artery disease.  She continues to have chest pain intermittently.  Have not received old records from [facility] for prior cath.  2. CAD s/p remote PCI in 2008  3. HTN 4. DM  5. Anemia  6. Hypokalemia resolved   PLAN:   1.  NPO after midnight 2.  Stress myoview in am 3.  Would strongly recommend GI consult in light of ongoing pain with anemia  Sueanne Margarita, MD  06/02/2011  9:45 AM

## 2011-06-03 ENCOUNTER — Observation Stay (HOSPITAL_COMMUNITY): Payer: Medicaid Other

## 2011-06-03 DIAGNOSIS — R0789 Other chest pain: Secondary | ICD-10-CM

## 2011-06-03 LAB — GLUCOSE, CAPILLARY

## 2011-06-03 MED ORDER — HYDROCODONE-ACETAMINOPHEN 5-500 MG PO TABS
1.0000 | ORAL_TABLET | ORAL | Status: DC | PRN
Start: 2011-06-03 — End: 2022-08-13

## 2011-06-03 MED ORDER — METOPROLOL TARTRATE 25 MG PO TABS: 25.0000 mg | ORAL_TABLET | Freq: Two times a day (BID) | ORAL | Status: DC

## 2011-06-03 MED ORDER — PANTOPRAZOLE SODIUM 40 MG PO TBEC: 40.0000 mg | DELAYED_RELEASE_TABLET | Freq: Every day | ORAL | Status: DC

## 2011-06-03 MED ORDER — METHYLPREDNISOLONE 4 MG PO KIT: 4.0000 mg | PACK | Freq: Three times a day (TID) | ORAL | Status: DC

## 2011-06-03 MED ORDER — METHYLPREDNISOLONE 4 MG PO KIT: 4.0000 mg | PACK | ORAL | Status: DC

## 2011-06-03 MED ORDER — METHYLPREDNISOLONE 4 MG PO KIT: 4.0000 mg | PACK | Freq: Four times a day (QID) | ORAL | Status: DC

## 2011-06-03 MED ORDER — METHYLPREDNISOLONE 4 MG PO KIT
8.0000 mg | PACK | Freq: Every morning | ORAL | Status: AC
  Administered 2011-06-03: 8 mg via ORAL
  Filled 2011-06-03: qty 21

## 2011-06-03 MED ORDER — METOPROLOL TARTRATE 1 MG/ML IV SOLN
INTRAVENOUS | Status: AC
  Filled 2011-06-03: qty 5

## 2011-06-03 MED ORDER — METHYLPREDNISOLONE 4 MG PO KIT
4.0000 mg | PACK | ORAL | Status: AC
  Administered 2011-06-03: 4 mg via ORAL

## 2011-06-03 MED ORDER — TECHNETIUM TC 99M TETROFOSMIN IV KIT
30.0000 | PACK | Freq: Once | INTRAVENOUS | Status: AC | PRN
  Administered 2011-06-03: 30 via INTRAVENOUS

## 2011-06-03 MED ORDER — METHYLPREDNISOLONE 4 MG PO KIT: PACK | ORAL | Status: DC

## 2011-06-03 MED ORDER — METHYLPREDNISOLONE 4 MG PO KIT: 8.0000 mg | PACK | Freq: Every evening | ORAL | Status: DC

## 2011-06-03 MED ORDER — REGADENOSON 0.4 MG/5ML IV SOLN
INTRAVENOUS | Status: AC
  Filled 2011-06-03: qty 5

## 2011-06-03 MED ORDER — METOPROLOL TARTRATE 1 MG/ML IV SOLN
5.0000 mg | Freq: Once | INTRAVENOUS | Status: AC
  Administered 2011-06-03: 5 mg via INTRAVENOUS

## 2011-06-03 MED ORDER — REGADENOSON 0.4 MG/5ML IV SOLN
0.4000 mg | Freq: Once | INTRAVENOUS | Status: AC
  Administered 2011-06-03: 0.4 mg via INTRAVENOUS

## 2011-06-03 MED ORDER — TECHNETIUM TC 99M TETROFOSMIN IV KIT
10.0000 | PACK | Freq: Once | INTRAVENOUS | Status: AC | PRN
  Administered 2011-06-03: 10 via INTRAVENOUS

## 2011-06-03 MED ORDER — METOPROLOL TARTRATE 25 MG PO TABS: 50.0000 mg | ORAL_TABLET | Freq: Two times a day (BID) | ORAL | Status: DC

## 2011-06-03 NOTE — Discharge Summary (Signed)
Physician Discharge Summary  Patient ID: Joanne Morris MRN: [id number] DOB/AGE: 1950/10/04 61 y.o.  Admit date: 05/31/2011 Discharge date: 06/03/2011  Primary Care Physician:  No primary provider on file.  Discharge Diagnoses:    .Atypical chest pain improved, nuclear medicine stress test negative for any reversible ischemia  .CAD (coronary artery disease) .Hypokalemia .HTN (hypertension) .Anemia .Leucocytosis .DM2 (diabetes mellitus, type 2) .Hyperlipemia  Consults: Cardiology, Dr. Golden Hurter                   GI Dr. Saralyn Pilar hung   Discharge Medications: Medication List  As of 06/03/2011 12:49 PM   TAKE these medications         amLODipine 5 MG tablet   Commonly known as: NORVASC   Take 5 mg by mouth daily.      aspirin EC 81 MG tablet   Take 81 mg by mouth daily.      HYDROcodone-acetaminophen 5-500 MG per tablet   Commonly known as: VICODIN   Take 1 tablet by mouth every 4 (four) hours as needed for pain. For pain      lisinopril-hydrochlorothiazide 20-12.5 MG per tablet   Commonly known as: PRINZIDE,ZESTORETIC   Take 1 tablet by mouth daily.      metFORMIN 500 MG tablet   Commonly known as: GLUCOPHAGE   Take 500 mg by mouth 2 (two) times daily with a meal.      methylPREDNISolone 4 MG tablet   Commonly known as: MEDROL DOSEPAK   follow package directions      metoprolol tartrate 25 MG tablet   Commonly known as: LOPRESSOR   Take 2 tablets (50 mg total) by mouth 2 (two) times daily.      mulitivitamin with minerals Tabs   Take 1 tablet by mouth daily.      niacin 500 MG CR tablet   Commonly known as: NIASPAN   Take 500 mg by mouth at bedtime.      nitroGLYCERIN 0.4 MG SL tablet   Commonly known as: NITROSTAT   Place 0.4 mg under the tongue every 5 (five) minutes as needed. For chest pain      pantoprazole 40 MG tablet   Commonly known as: PROTONIX   Take 1 tablet (40 mg total) by mouth daily at 6 (six) AM.      pravastatin 40 MG tablet   Commonly  known as: PRAVACHOL   Take 40 mg by mouth daily.      zolpidem 5 MG tablet   Commonly known as: AMBIEN   Take 5 mg by mouth at bedtime as needed. For sleep             Brief H and P: For complete details please refer to admission H and P, but in brief patient is a 61-year-old female with history of coronary artery disease who apparently had a stent 5 years ago but likely noncompliant, poor historian presented with left-sided chest point, posterior mainly with motion with no nausea vomiting or diarrhea. Patient had no EKG changes or elevation of the enzymes, and was admitted for further workup of atypical chest pain.  Hospital Course:  Principal Problem:  *Atypical chest pain: Improved, patient was admitted to the telemetry floor, cardiac enzymes were obtained, was negative for any acute ACS. Given patient's high risk and history of coronary disease, cardiology was consulted and she underwent 2-D echocardiogram which showed EF of 55-60%. Patient underwent nuclear medicine stress test which showed no reversible ischemia. gastroenterology was  also consulted given patient's atypical chest pain. Patient was seen by Dr. Carol Ada, recommended Medrol Dosepak after the stress test is completed, patient is to continue protonic, she will followup in the office to schedule an endoscopy.   Day of Discharge BP 193/78  Pulse 62  Temp(Src) 98.5 F (36.9 C) (Oral)  Resp 18  Ht 4\' 11"  (1.499 m)  Wt 59.421 kg (131 lb)  BMI 26.46 kg/m2  SpO2 100%  Physical Exam: General: Alert and awake oriented x3 not in any acute distress. HEENT: anicteric sclera, pupils reactive to light and accommodation CVS: S1-S2 clear no murmur rubs or gallops Chest: clear to auscultation bilaterally, no wheezing rales or rhonchi Abdomen: soft nontender, nondistended, normal bowel sounds, no organomegaly Extremities: no cyanosis, clubbing or edema noted bilaterally Neuro: Cranial nerves II-XII intact, no focal  neurological deficits   The results of significant diagnostics from this hospitalization (including imaging, microbiology, ancillary and laboratory) are listed below for reference.    LAB RESULTS: Basic Metabolic Panel:  Lab [id number] 0028 05/31/11 0703 05/31/11 0025  NA 137 -- 140  K 3.8 -- 3.0*  CL 100 -- 99  CO2 25 -- 26  GLUCOSE 114* -- 124*  BUN 9 -- 18  CREATININE 0.66 0.66 --  CALCIUM 9.7 -- 10.3  MG -- 1.7 --  PHOS -- -- --   Liver Function Tests:  Lab 06/01/11 0028 05/31/11 0025  AST 34 20  ALT 22 12  ALKPHOS 54 50  BILITOT 0.2* 0.2*  PROT 6.9 7.3  ALBUMIN 3.9 4.1   CBC:  Lab 06/01/11 0028 05/31/11 0703  WBC 8.5 9.9  NEUTROABS -- --  HGB 10.1* 10.1*  HCT 30.5* 31.4*  MCV 77.8* --  PLT 301 331   Cardiac Enzymes:  Lab 06/01/11 0029 05/31/11 1752  CKTOTAL 89 104  CKMB 1.3 1.5  CKMBINDEX -- --  TROPONINI <0.30 <0.30   BNP: No components found with this basename: POCBNP:2 CBG:  Lab 06/03/11 1131 06/02/11 2110  GLUCAP 149* 114*    Significant Diagnostic Studies:  Ct Angio Chest W/cm &/or Wo Cm  05/31/2011  *RADIOLOGY REPORT*  Clinical Data: Elevated D-dimer.  History of coronary artery disease.  Diabetes.  Hypertension.  Stroke.  CT ANGIOGRAPHY CHEST  Technique:  Multidetector CT imaging of the chest using the standard protocol during bolus administration of intravenous contrast. Multiplanar reconstructed images including MIPs were obtained and reviewed to evaluate the vascular anatomy.  Contrast: 60mL OMNIPAQUE IOHEXOL 350 MG/ML SOLN  Comparison: Plain film of 05/31/2011.  Prior CT of 08/18/2009.  Findings: Lung windows demonstrate mild motion degradation, including above the carina and at the lung bases.  Volume loss or scarring at the inferior right middle lobe.  Scarring or atelectasis also involves the left lung base.  Soft tissue windows:  The quality of this exam for evaluation of pulmonary embolism is good.  The bolus is well timed.  The only  limitation is the minimal above described motion artifact. No evidence of pulmonary embolism.  Aorta normal in caliber.  Not well opacified to evaluate for dissection.  Mild cardiomegaly. Multivessel coronary artery atherosclerosis.  No pericardial or pleural effusion.  No mediastinal or hilar adenopathy.  Small retrocrural nodes are unchanged.  Limited abdominal imaging demonstrates no significant findings. Moderate thoracic spondylosis.  IMPRESSION:  1. No evidence of pulmonary embolism.  Mildly motion degraded exam. 2.  Cardiomegaly with age advanced coronary artery atherosclerosis.  Original Report Authenticated By: Areta Haber, M.D.  Dg Chest Portable 1 View  05/31/2011  *RADIOLOGY REPORT*  Clinical Data: Chest pain and shortness of breath.  PORTABLE CHEST - 1 VIEW  Comparison: Chest radiograph performed 07/18/2010  Findings: The lungs are well-aerated.  Mild vascular congestion is noted, without significant pulmonary edema.  There is no evidence of focal opacification, pleural effusion or pneumothorax.  The cardiomediastinal silhouette is borderline normal in size.  No acute osseous abnormalities are seen.  IMPRESSION: Mild vascular congestion noted, without significant pulmonary edema.  Original Report Authenticated By: Santa Lighter, M.D.     Disposition and Follow-up: Discharge Orders    Future Appointments: Provider: Department: Dept Phone: Center:   06/27/2011 10:00 AM Fay Records, [place] [phone] [id number]     Future Orders Please Complete By Expires   Diet Carb Modified      Increase activity slowly          DISPOSITION: Home  DIET: Heart healthy, carb modified  ACTIVITY: As tolerated    DISCHARGE FOLLOW-UP Follow-up Information    Follow up with Dorris Carnes, MD on 06/27/2011. (you have appt. on 06/27/2011 at 10:00)    Contact information:   [facility] [place] [phone]       Follow  up with Philis Fendt, MD. Schedule an appointment as soon as possible for a visit in 10 days. (for hospital follow-up)    Contact information:   [address] [place] [phone]       Follow up with Beryle Beams, MD. Schedule an appointment as soon as possible for a visit in 3 weeks. (for follow-up of anemia, need out-patient endoscopy )    Contact information:   [address], [place] [phone]          Time spent on Discharge: 45 minutes  Signed:  Jany Buckwalter M.D. [facility] 06/03/2011, 12:49 PM

## 2011-06-03 NOTE — Progress Notes (Signed)
Utilization review complete 

## 2011-06-04 LAB — GLUCOSE, CAPILLARY

## 2011-06-27 ENCOUNTER — Encounter: Payer: Self-pay | Admitting: Internal Medicine

## 2011-06-27 ENCOUNTER — Ambulatory Visit (INDEPENDENT_AMBULATORY_CARE_PROVIDER_SITE_OTHER): Payer: Medicaid Other | Admitting: Internal Medicine

## 2011-06-27 VITALS — BP 180/80 | HR 57 | Ht 62.0 in | Wt 125.0 lb

## 2011-06-27 DIAGNOSIS — I2510 Atherosclerotic heart disease of native coronary artery without angina pectoris: Secondary | ICD-10-CM

## 2011-06-27 DIAGNOSIS — I10 Essential (primary) hypertension: Secondary | ICD-10-CM

## 2011-06-27 MED ORDER — AMLODIPINE BESYLATE 5 MG PO TABS: 5.0000 mg | ORAL_TABLET | Freq: Two times a day (BID) | ORAL | Status: DC

## 2011-06-27 NOTE — Patient Instructions (Signed)
Increase Amlodipine to 5 mg 2 times per day.  Nurse visit for BP and HR check in 3 weeks.  Sign records release from [facility] for stent placement from about 5 years ago.

## 2011-06-27 NOTE — Progress Notes (Addendum)
HPI Patient is a 61 year old who is post discharge.  She has a history of reported CAD with stent 5 years ago (no records found)  Pain was worse with motion and with pressing L chest. She was admtted to [facility] in April with CP.  R/O for MI  Myoview was negative for ischemia She also has ha history of HTN and DM and dyslipidemia.  She says the CP is gone.  No SOB.    No Known Allergies    Past Medical History  Diagnosis Date  . Hypertension   . Diabetes mellitus   . Coronary artery disease   . Angina   . Myocardial infarction     per patient  . Stroke     per patient  . Arthritis     knee  per patient  . Depression     Past Surgical History  Procedure Date  . Coronary stent placement   . Abdominal hysterectomy   . Tubal ligation     History reviewed. No pertinent family history.  History   Social History  . Marital Status: Single    Spouse Name: N/A    Number of Children: N/A  . Years of Education: N/A   Occupational History  . Not on file.   Social History Main Topics  . Smoking status: Former Smoker    Quit date: 05/30/2009  . Smokeless tobacco: Never Used  . Alcohol Use: No  . Drug Use: No  . Sexually Active: No   Other Topics Concern  . Not on file   Social History Narrative  . No narrative on file    Review of Systems:  All systems reviewed.  They are negative to the above problem except as previously stated.  Vital Signs: BP 160/78  Pulse 57  Ht 5\' 2"  (1.575 m)  Wt 125 lb (56.7 kg)  BMI 22.86 kg/m2  Physical Exam  HEENT:  Normocephalic, atraumatic. EOMI, PERRLA.  Neck: JVP is normal. No thyromegaly. No bruits.  Lungs: clear to auscultation. No rales no wheezes.  Heart: Regular rate and rhythm. Normal S1, S2. No S3.   No significant murmurs. PMI not displaced.  Abdomen:  Supple, nontender. Normal bowel sounds. No masses. No hepatomegaly.  Extremities:   Good distal pulses throughout. No lower extremity edema.  Musculoskeletal  :moving all extremities.  Neuro:   alert and oriented x3.  CN II-XII grossly intact.  EKG:  Sinus bradycardia 57.  T wave inversion II, III, AVF, V4 to V6. Assessment and Plan:  1.  CP.  Resolved.  On review, I am not convinced that spell was angina.   I will try to get records from [facility] where she reports she had a stent placed 5 years ago  2.  CAD  See above.  3HTN  Not good control  She says that she takes her meds.  Will increase Norvasc to 5 bid.  F/U BP with nurse visit in 3 wks.

## 2011-12-30 ENCOUNTER — Other Ambulatory Visit: Payer: Self-pay | Admitting: Internal Medicine

## 2011-12-30 DIAGNOSIS — Z1231 Encounter for screening mammogram for malignant neoplasm of breast: Secondary | ICD-10-CM

## 2012-02-07 ENCOUNTER — Ambulatory Visit: Payer: Medicaid Other

## 2012-03-02 ENCOUNTER — Ambulatory Visit: Payer: Medicaid Other

## 2012-03-04 ENCOUNTER — Ambulatory Visit: Payer: Medicaid Other

## 2012-03-31 ENCOUNTER — Ambulatory Visit
Admission: RE | Admit: 2012-03-31 | Discharge: 2012-03-31 | Disposition: A | Discharge status: Home / Self Care | Payer: Medicaid Other | Source: Ambulatory Visit | Attending: Internal Medicine | Admitting: Internal Medicine

## 2012-03-31 DIAGNOSIS — Z1231 Encounter for screening mammogram for malignant neoplasm of breast: Secondary | ICD-10-CM

## 2012-10-11 ENCOUNTER — Observation Stay (HOSPITAL_COMMUNITY): Payer: Medicare Other

## 2012-10-11 ENCOUNTER — Emergency Department (HOSPITAL_COMMUNITY): Payer: Medicare Other

## 2012-10-11 ENCOUNTER — Encounter (HOSPITAL_COMMUNITY): Payer: Self-pay

## 2012-10-11 ENCOUNTER — Observation Stay (HOSPITAL_COMMUNITY)
Admission: EM | Admit: 2012-10-11 | Discharge: 2012-10-13 | Disposition: A | Discharge status: Home / Self Care | Payer: Medicare Other | Attending: Internal Medicine | Admitting: Internal Medicine

## 2012-10-11 DIAGNOSIS — R109 Unspecified abdominal pain: Secondary | ICD-10-CM | POA: Diagnosis present

## 2012-10-11 DIAGNOSIS — R079 Chest pain, unspecified: Secondary | ICD-10-CM | POA: Insufficient documentation

## 2012-10-11 DIAGNOSIS — Z87891 Personal history of nicotine dependence: Secondary | ICD-10-CM | POA: Insufficient documentation

## 2012-10-11 DIAGNOSIS — M51379 Other intervertebral disc degeneration, lumbosacral region without mention of lumbar back pain or lower extremity pain: Secondary | ICD-10-CM | POA: Insufficient documentation

## 2012-10-11 DIAGNOSIS — M5137 Other intervertebral disc degeneration, lumbosacral region: Secondary | ICD-10-CM | POA: Insufficient documentation

## 2012-10-11 DIAGNOSIS — F32A Depression, unspecified: Secondary | ICD-10-CM

## 2012-10-11 DIAGNOSIS — M5410 Radiculopathy, site unspecified: Secondary | ICD-10-CM

## 2012-10-11 DIAGNOSIS — E876 Hypokalemia: Secondary | ICD-10-CM

## 2012-10-11 DIAGNOSIS — R0789 Other chest pain: Secondary | ICD-10-CM

## 2012-10-11 DIAGNOSIS — Z794 Long term (current) use of insulin: Secondary | ICD-10-CM | POA: Insufficient documentation

## 2012-10-11 DIAGNOSIS — Z9861 Coronary angioplasty status: Secondary | ICD-10-CM | POA: Insufficient documentation

## 2012-10-11 DIAGNOSIS — F3289 Other specified depressive episodes: Secondary | ICD-10-CM | POA: Insufficient documentation

## 2012-10-11 DIAGNOSIS — D649 Anemia, unspecified: Secondary | ICD-10-CM | POA: Insufficient documentation

## 2012-10-11 DIAGNOSIS — I252 Old myocardial infarction: Secondary | ICD-10-CM | POA: Insufficient documentation

## 2012-10-11 DIAGNOSIS — D72829 Elevated white blood cell count, unspecified: Secondary | ICD-10-CM

## 2012-10-11 DIAGNOSIS — R1013 Epigastric pain: Principal | ICD-10-CM | POA: Insufficient documentation

## 2012-10-11 DIAGNOSIS — Z79899 Other long term (current) drug therapy: Secondary | ICD-10-CM | POA: Insufficient documentation

## 2012-10-11 DIAGNOSIS — F329 Major depressive disorder, single episode, unspecified: Secondary | ICD-10-CM | POA: Insufficient documentation

## 2012-10-11 DIAGNOSIS — E785 Hyperlipidemia, unspecified: Secondary | ICD-10-CM | POA: Insufficient documentation

## 2012-10-11 DIAGNOSIS — IMO0002 Reserved for concepts with insufficient information to code with codable children: Secondary | ICD-10-CM

## 2012-10-11 DIAGNOSIS — E119 Type 2 diabetes mellitus without complications: Secondary | ICD-10-CM | POA: Insufficient documentation

## 2012-10-11 DIAGNOSIS — I10 Essential (primary) hypertension: Secondary | ICD-10-CM | POA: Insufficient documentation

## 2012-10-11 DIAGNOSIS — R0602 Shortness of breath: Secondary | ICD-10-CM | POA: Insufficient documentation

## 2012-10-11 DIAGNOSIS — I2510 Atherosclerotic heart disease of native coronary artery without angina pectoris: Secondary | ICD-10-CM

## 2012-10-11 HISTORY — DX: Unspecified abdominal pain: R10.9

## 2012-10-11 HISTORY — DX: Radiculopathy, site unspecified: M54.10

## 2012-10-11 LAB — BASIC METABOLIC PANEL
BUN: 13 mg/dL (ref 6–23)
Chloride: 100 mEq/L (ref 96–112)
GFR calc Af Amer: >90 mL/min (ref >90)
Potassium: 3.4 mEq/L — ABNORMAL LOW (ref 3.5–5.1)

## 2012-10-11 LAB — TROPONIN I: Troponin I: <0.3 ng/mL (ref <0.30)

## 2012-10-11 LAB — CBC
HCT: 33.6 % — ABNORMAL LOW (ref 36.0–46.0)
Platelets: 294 10*3/uL (ref 150–400)
RDW: 15 % (ref 11.5–15.5)
WBC: 9.3 10*3/uL (ref 4.0–10.5)

## 2012-10-11 LAB — URINE MICROSCOPIC-ADD ON

## 2012-10-11 LAB — HEPATIC FUNCTION PANEL
AST: 31 U/L (ref 0–37)
Albumin: 4.1 g/dL (ref 3.5–5.2)
Alkaline Phosphatase: 70 U/L (ref 39–117)
Total Bilirubin: 0.2 mg/dL — ABNORMAL LOW (ref 0.3–1.2)

## 2012-10-11 LAB — URINALYSIS, ROUTINE W REFLEX MICROSCOPIC
Glucose, UA: NEGATIVE mg/dL
Ketones, ur: NEGATIVE mg/dL
Leukocytes, UA: NEGATIVE
pH: 7 (ref 5.0–8.0)

## 2012-10-11 LAB — CG4 I-STAT (LACTIC ACID): Lactic Acid, Venous: 1.86 mmol/L (ref 0.5–2.2)

## 2012-10-11 LAB — LIPID PANEL
LDL Cholesterol: UNDETERMINED mg/dL (ref 0–99)
Total CHOL/HDL Ratio: 5.1 RATIO
VLDL: UNDETERMINED mg/dL (ref 0–40)

## 2012-10-11 LAB — POCT I-STAT TROPONIN I

## 2012-10-11 LAB — PROTIME-INR: INR: 0.87 (ref 0.00–1.49)

## 2012-10-11 MED ORDER — SODIUM CHLORIDE 0.9 % IV SOLN: 250.0000 mL | INTRAVENOUS | Status: DC | PRN

## 2012-10-11 MED ORDER — LISINOPRIL-HYDROCHLOROTHIAZIDE 20-12.5 MG PO TABS: 1.0000 | ORAL_TABLET | Freq: Two times a day (BID) | ORAL | Status: DC

## 2012-10-11 MED ORDER — SODIUM CHLORIDE 0.9 % IV SOLN: INTRAVENOUS | Status: DC

## 2012-10-11 MED ORDER — HYDROCHLOROTHIAZIDE 12.5 MG PO CAPS
12.5000 mg | ORAL_CAPSULE | Freq: Two times a day (BID) | ORAL | Status: DC
  Administered 2012-10-11 – 2012-10-13 (×4): 12.5 mg via ORAL
  Filled 2012-10-11 (×5): qty 1

## 2012-10-11 MED ORDER — DIPHENHYDRAMINE HCL 25 MG PO CAPS
25.0000 mg | ORAL_CAPSULE | Freq: Every evening | ORAL | Status: DC | PRN
  Administered 2012-10-12: 25 mg via ORAL
  Filled 2012-10-11: qty 1

## 2012-10-11 MED ORDER — NITROGLYCERIN 0.4 MG SL SUBL
0.4000 mg | SUBLINGUAL_TABLET | SUBLINGUAL | Status: DC | PRN
  Administered 2012-10-11: 0.4 mg via SUBLINGUAL
  Filled 2012-10-11: qty 25

## 2012-10-11 MED ORDER — HEPARIN SODIUM (PORCINE) 5000 UNIT/ML IJ SOLN
5000.0000 [IU] | Freq: Three times a day (TID) | INTRAMUSCULAR | Status: DC
  Administered 2012-10-11 – 2012-10-13 (×5): 5000 [IU] via SUBCUTANEOUS
  Filled 2012-10-11 (×8): qty 1

## 2012-10-11 MED ORDER — ONDANSETRON HCL 4 MG/2ML IJ SOLN: 4.0000 mg | Freq: Four times a day (QID) | INTRAMUSCULAR | Status: DC | PRN

## 2012-10-11 MED ORDER — INSULIN ASPART 100 UNIT/ML ~~LOC~~ SOLN: 0.0000 [IU] | SUBCUTANEOUS | Status: DC

## 2012-10-11 MED ORDER — INSULIN ASPART 100 UNIT/ML ~~LOC~~ SOLN: 0.0000 [IU] | Freq: Three times a day (TID) | SUBCUTANEOUS | Status: DC

## 2012-10-11 MED ORDER — ASPIRIN EC 325 MG PO TBEC
325.0000 mg | DELAYED_RELEASE_TABLET | Freq: Every day | ORAL | Status: DC
  Administered 2012-10-12 – 2012-10-13 (×2): 325 mg via ORAL
  Filled 2012-10-11 (×2): qty 1

## 2012-10-11 MED ORDER — MORPHINE SULFATE 2 MG/ML IJ SOLN: 2.0000 mg | INTRAMUSCULAR | Status: DC | PRN

## 2012-10-11 MED ORDER — SIMVASTATIN 20 MG PO TABS
20.0000 mg | ORAL_TABLET | Freq: Every day | ORAL | Status: DC
  Administered 2012-10-11 – 2012-10-12 (×2): 20 mg via ORAL
  Filled 2012-10-11 (×3): qty 1

## 2012-10-11 MED ORDER — LISINOPRIL 20 MG PO TABS
20.0000 mg | ORAL_TABLET | Freq: Two times a day (BID) | ORAL | Status: DC
  Administered 2012-10-11 – 2012-10-13 (×4): 20 mg via ORAL
  Filled 2012-10-11 (×5): qty 1

## 2012-10-11 MED ORDER — ALPRAZOLAM 0.25 MG PO TABS
0.2500 mg | ORAL_TABLET | Freq: Every day | ORAL | Status: DC | PRN
  Administered 2012-10-11: 0.25 mg via ORAL
  Filled 2012-10-11: qty 1

## 2012-10-11 MED ORDER — MORPHINE SULFATE 4 MG/ML IJ SOLN
4.0000 mg | Freq: Once | INTRAMUSCULAR | Status: AC
  Administered 2012-10-11: 4 mg via INTRAVENOUS
  Filled 2012-10-11: qty 1

## 2012-10-11 MED ORDER — AMLODIPINE BESYLATE 10 MG PO TABS
10.0000 mg | ORAL_TABLET | Freq: Every day | ORAL | Status: DC
  Administered 2012-10-12 – 2012-10-13 (×2): 10 mg via ORAL
  Filled 2012-10-11 (×2): qty 1

## 2012-10-11 MED ORDER — INSULIN ASPART 100 UNIT/ML ~~LOC~~ SOLN: 0.0000 [IU] | Freq: Every day | SUBCUTANEOUS | Status: DC

## 2012-10-11 MED ORDER — IOHEXOL 300 MG/ML  SOLN: 100.0000 mL | Freq: Once | INTRAMUSCULAR | Status: AC | PRN

## 2012-10-11 MED ORDER — ACETAMINOPHEN 325 MG PO TABS: 650.0000 mg | ORAL_TABLET | Freq: Four times a day (QID) | ORAL | Status: DC | PRN

## 2012-10-11 MED ORDER — SODIUM CHLORIDE 0.9 % IJ SOLN
3.0000 mL | Freq: Two times a day (BID) | INTRAMUSCULAR | Status: DC
  Administered 2012-10-12 – 2012-10-13 (×2): 3 mL via INTRAVENOUS

## 2012-10-11 MED ORDER — OXYCODONE HCL 5 MG PO TABS
5.0000 mg | ORAL_TABLET | ORAL | Status: DC | PRN
  Administered 2012-10-12: 5 mg via ORAL
  Filled 2012-10-11: qty 1

## 2012-10-11 MED ORDER — SODIUM CHLORIDE 0.9 % IJ SOLN: 3.0000 mL | INTRAMUSCULAR | Status: DC | PRN

## 2012-10-11 MED ORDER — INSULIN ASPART 100 UNIT/ML ~~LOC~~ SOLN
0.0000 [IU] | Freq: Three times a day (TID) | SUBCUTANEOUS | Status: DC
  Administered 2012-10-13: 2 [IU] via SUBCUTANEOUS

## 2012-10-11 MED ORDER — ACETAMINOPHEN 650 MG RE SUPP: 650.0000 mg | Freq: Four times a day (QID) | RECTAL | Status: DC | PRN

## 2012-10-11 MED ORDER — PANTOPRAZOLE SODIUM 40 MG IV SOLR
40.0000 mg | Freq: Two times a day (BID) | INTRAVENOUS | Status: DC
  Administered 2012-10-11 – 2012-10-12 (×3): 40 mg via INTRAVENOUS
  Filled 2012-10-11 (×5): qty 40

## 2012-10-11 MED ORDER — ONDANSETRON HCL 4 MG PO TABS: 4.0000 mg | ORAL_TABLET | Freq: Four times a day (QID) | ORAL | Status: DC | PRN

## 2012-10-11 MED ORDER — SODIUM CHLORIDE 0.9 % IJ SOLN
3.0000 mL | Freq: Two times a day (BID) | INTRAMUSCULAR | Status: DC
  Administered 2012-10-11 – 2012-10-12 (×2): 3 mL via INTRAVENOUS

## 2012-10-11 NOTE — ED Notes (Signed)
Report called to [address] . Waiting for Pt to return from CT scan.

## 2012-10-11 NOTE — H&P (Signed)
PCP:   Philis Fendt, MD   Chief Complaint:  Chest pain.   HPI: This is a 62 year old female, with known history of HTN, DM-2, CAD, s/p MI, s/p PCI/Stent 6 years ago at [facility], s/p CVA, dyslipidemia, depression, query GERD, OA. Patient is s/p hospitalization at [facility] 05/31/11-06/03/11, for chest pain, and at that time, had negative stress Myoview. According to patient, she was quite ok all day on 10/10/12, and went to bed at her usual time. She awoke at about 6:05 AM on 10/11/12, with retrosternal chest "tightness", radiating to her right shoulder, associated sweating and nausea, lasting about 5-10 minutes. Patient called 911, and was brought to the ED. Patient has been experiencing pain behind her right leg on ambulation, for about a year now. She denies low back pain. According to her, the PMD gave her some pills for this, but these did not help.    Allergies:  No Known Allergies    Past Medical History  Diagnosis Date  . Hypertension   . Diabetes mellitus   . Coronary artery disease   . Angina   . Myocardial infarction     per patient  . Stroke     per patient  . Arthritis     knee  per patient  . Depression     Past Surgical History  Procedure Laterality Date  . Coronary stent placement    . Abdominal hysterectomy    . Tubal ligation      Prior to Admission medications   Medication Sig Start Date End Date Taking? Authorizing Provider  ALPRAZolam [name]) 0.25 MG tablet Take 0.25 mg by mouth daily as needed for anxiety.   Yes Historical Provider, MD  amLODipine (NORVASC) 10 MG tablet Take 10 mg by mouth daily.   Yes Historical Provider, MD  aspirin EC 81 MG tablet Take 81 mg by mouth daily.   Yes Historical Provider, MD  diclofenac (VOLTAREN) 75 MG EC tablet Take 75 mg by mouth 2 (two) times daily.   Yes Historical Provider, MD  lisinopril-hydrochlorothiazide (PRINZIDE,ZESTORETIC) 20-12.5 MG per tablet Take 1 tablet by mouth 2 (two) times daily.    Yes Historical  Provider, MD  metFORMIN (GLUCOPHAGE) 500 MG tablet Take 500 mg by mouth daily with breakfast.    Yes Historical Provider, MD  nitroGLYCERIN (NITROSTAT) 0.4 MG SL tablet Place 0.4 mg under the tongue every 5 (five) minutes as needed. For chest pain   Yes Historical Provider, MD  omeprazole (PRILOSEC) 20 MG capsule Take 20 mg by mouth daily.   Yes Historical Provider, MD  pravastatin (PRAVACHOL) 40 MG tablet Take 40 mg by mouth daily.   Yes Historical Provider, MD  traMADol (ULTRAM) 50 MG tablet Take 50-100 mg by mouth every 8 (eight) hours as needed for pain.   Yes Historical Provider, MD    Social History: Patient reports that she quit smoking about 3 years ago. She has never used smokeless tobacco. She reports that she does not drink alcohol or use illicit drugs.  Family History: Both parents died when patient was quite young, so she did not know their health history.   Review of Systems:  As per HPI and chief complaint. Patent denies fatigue, diminished appetite, weight loss, fever, chills, headache, blurred vision, difficulty in speaking, dysphagia, cough, shortness of breath, orthopnea, paroxysmal nocturnal dyspnea, nausea, diaphoresis, abdominal pain, vomiting, diarrhea, belching, heartburn, hematemesis, melena, dysuria, nocturia, urinary frequency, hematochezia, lower extremity swelling, pain, or redness. The rest of the  systems review is negative.  Physical Exam:  General:  Alert, communicative, fully oriented, talking in complete sentences, not short of breath at rest.  HEENT:  Mild clinical pallor, no jaundice, no conjunctival injection or discharge. Hydration is fair. NECK:  Supple, JVP not seen, no carotid bruits, no palpable lymphadenopathy, no palpable goiter. CHEST:  Clinically clear to auscultation, no wheezes, no crackles. HEART:  Sounds 1 and 2 heard, normal, regular, no murmurs. ABDOMEN:  Full, soft, moderately tender in the epigastrium, no palpable organomegaly, no palpable  masses, normal bowel sounds. GENITALIA:  Not examined. LOWER EXTREMITIES:  No pitting edema, palpable peripheral pulses. MUSCULOSKELETAL SYSTEM:  Generalized osteoarthritic changes, otherwise, normal. Straight-leg raising test is positive on right.  CENTRAL NERVOUS SYSTEM:  No focal neurologic deficit on gross examination.  Labs on Admission:  Results for orders placed during the hospital encounter of 10/11/12 (from the past 48 hour(s))  CBC     Status: Abnormal   Collection Time    10/11/12  1:00 PM      Result Value Range   WBC 9.3  4.0 - 10.5 K/uL   RBC 4.39  3.87 - 5.11 MIL/uL   Hemoglobin 11.4 (*) 12.0 - 15.0 g/dL   HCT 33.6 (*) 36.0 - 46.0 %   MCV 76.5 (*) 78.0 - 100.0 fL   MCH 26.0  26.0 - 34.0 pg   MCHC 33.9  30.0 - 36.0 g/dL   RDW 15.0  11.5 - 15.5 %   Platelets 294  150 - 400 K/uL  BASIC METABOLIC PANEL     Status: Abnormal   Collection Time    10/11/12  1:00 PM      Result Value Range   Sodium 140  135 - 145 mEq/L   Potassium 3.4 (*) 3.5 - 5.1 mEq/L   Chloride 100  96 - 112 mEq/L   CO2 26  19 - 32 mEq/L   Glucose, Bld 109 (*) 70 - 99 mg/dL   BUN 13  6 - 23 mg/dL   Creatinine, Ser 0.67  0.50 - 1.10 mg/dL   Calcium 10.1  8.4 - 10.5 mg/dL   GFR calc non Af Amer >90  >90 mL/min   GFR calc Af Amer >90  >90 mL/min   Comment: (NOTE)     The eGFR has been calculated using the CKD EPI equation.     This calculation has not been validated in all clinical situations.     eGFR's persistently <90 mL/min signify possible Chronic Kidney     Disease.  POCT I-STAT TROPONIN I     Status: None   Collection Time    10/11/12  1:24 PM      Result Value Range   Troponin i, poc 0.01  0.00 - 0.08 ng/mL   Comment 3            Comment: Due to the release kinetics of cTnI,     a negative result within the first hours     of the onset of symptoms does not rule out     myocardial infarction with certainty.     If myocardial infarction is still suspected,     repeat the test at  appropriate intervals.  HEPATIC FUNCTION PANEL     Status: Abnormal   Collection Time    10/11/12  1:35 PM      Result Value Range   Total Protein 7.6  6.0 - 8.3 g/dL   Albumin 4.1  3.5 - 5.2  g/dL   AST 31  0 - 37 U/L   ALT 24  0 - 35 U/L   Alkaline Phosphatase 70  39 - 117 U/L   Total Bilirubin 0.2 (*) 0.3 - 1.2 mg/dL   Bilirubin, Direct <0.1  0.0 - 0.3 mg/dL   Indirect Bilirubin NOT CALCULATED  0.3 - 0.9 mg/dL  LIPASE, BLOOD     Status: None   Collection Time    10/11/12  1:35 PM      Result Value Range   Lipase 39  11 - 59 U/L  URINALYSIS, ROUTINE W REFLEX MICROSCOPIC     Status: Abnormal   Collection Time    10/11/12  1:35 PM      Result Value Range   Color, Urine YELLOW  YELLOW   APPearance CLEAR  CLEAR   Specific Gravity, Urine 1.006  1.005 - 1.030   pH 7.0  5.0 - 8.0   Glucose, UA NEGATIVE  NEGATIVE mg/dL   Hgb urine dipstick TRACE (*) NEGATIVE   Bilirubin Urine NEGATIVE  NEGATIVE   Ketones, ur NEGATIVE  NEGATIVE mg/dL   Protein, ur NEGATIVE  NEGATIVE mg/dL   Urobilinogen, UA 0.2  0.0 - 1.0 mg/dL   Nitrite NEGATIVE  NEGATIVE   Leukocytes, UA NEGATIVE  NEGATIVE  URINE MICROSCOPIC-ADD ON     Status: None   Collection Time    10/11/12  1:35 PM      Result Value Range   Squamous Epithelial / LPF RARE  RARE   WBC, UA 0-2  <3 WBC/hpf   RBC / HPF 0-2  <3 RBC/hpf  CG4 I-STAT (LACTIC ACID)     Status: None   Collection Time    10/11/12  1:59 PM      Result Value Range   Lactic Acid, Venous 1.86  0.5 - 2.2 mmol/L    Radiological Exams on Admission: Dg Chest Port 1 View  10/11/2012   *RADIOLOGY REPORT*  Clinical Data:  PORTABLE CHEST - 1 VIEW  Comparison: 05/31/2011  Findings: Cardiac shadow is stable.  The lungs are clear.  No bony abnormality is noted.  IMPRESSION: No acute abnormality noted.   Original Report Authenticated By: Inez Catalina, M.D.    Assessment/Plan Active Problems:   1. Atypical Chest Pain: Patient presented with a typical sounding chest  discomfort, described as retrosternal tightness, radiating to right shoulder. Reportedly, this lasted only about 5-10 minutes, but on examination, she has moderate epigastric tenderness to palpation, which she says is exactly the pain she had experienced. CXR is devoid of active disease, 12-lead EKG shows infero-lateral T-wave flattening/inversion, but this is unchanged from EKG of 07/2011. Initial cardiac troponin is unelevated. Given obvious cardiovascular risk factors, we shall admit for observation, monitor telemetrically, and complete cycling cardiac enzymes.  2. Abdominal pain: As described above, patient does have moderate epigastric tenderness to pal[pation, although she has no vomiting or diarrhea. Fortunately, Lipase is normal at 39. Patient takes NSAIDS for OA, so the specter of peptic ulcer disease or erosive gastritis is raised. During patient's hospitalization in 05/2011, she was thought to have GERD, outpatient follow up with Dr Carol Ada for EGD was arranged, but it appears that patient never followed up. We shall hold NSAIDS and place patient on PPI. Abdominal CT scan has been ordered by ED MD, and is pending.  3. CAD: Patient has known history of CAD, s/p MI, s/p PCI/Stent 6 years ago at [facility], and is s/p negative stress Myoview in  05/2011. She saw Dr Dorris Carnes in the office in 07/2011, and has not followed up since. Will manage as described in #1, but patient may benefit from further stratification.  4. HTN: Controlled at this time.  5. Dyslipidemia: Will continue pre-admission lipid-lowering medication, and check lipid profile.  6. Diabetes mellitus: This is type 2, and appears controlled, based on random blood glucose of 109. Will place oral hypoglycemics on hold for now, and manage with SSI.  7. Depression: Stable.  8. Radiculopathy of leg: Patient has been troubled by pain behind right leg, on ambulation. As straight leg-raising test is positive, and she has good  peripheral pulses, suspect a lumbar radiculopathy. Evaluation with lumbar MRI in due course, would be helpful.   Further management will depend on clinical course.  Comment: Patient is FULL CODE.    Time Spent on Admission: 1 Hour.   Kalep Full,CHRISTOPHER 10/11/2012, 5:07 PM

## 2012-10-11 NOTE — ED Notes (Signed)
PT to CT scan

## 2012-10-11 NOTE — ED Provider Notes (Signed)
CSN: [id number]     Arrival date & time 10/11/12  1207 History   First MD Initiated Contact with Patient 10/11/12 1319     Chief Complaint  Patient presents with  . Chest Pain   (Consider location/radiation/quality/duration/timing/severity/associated sxs/prior Treatment) HPI Comments: 62 year old female with mid chest pain for several hours since this morning. She states it woke her up out of sleep. She's also had some shortness of breath. Denies any nausea or vomiting. States it feels just like that time she had a cardial infarction several years ago. She has a stent. She did not follow up with cardiologist in several years. There is no radiation of her pain. She doesn't worse abdominal pain on review of systems. States that she's had this before but never got checked out and resolved on its own. She was given aspirin and nitroglycerin by EMS with mild relief but still has 7/10 pain.  The history is provided by the patient.    Past Medical History  Diagnosis Date  . Hypertension   . Diabetes mellitus   . Coronary artery disease   . Angina   . Myocardial infarction     per patient  . Stroke     per patient  . Arthritis     knee  per patient  . Depression    Past Surgical History  Procedure Laterality Date  . Coronary stent placement    . Abdominal hysterectomy    . Tubal ligation     No family history on file. History  Substance Use Topics  . Smoking status: Former Smoker    Quit date: 05/30/2009  . Smokeless tobacco: Never Used  . Alcohol Use: No   OB History   Grav Para Term Preterm Abortions TAB SAB Ect Mult Living                 Review of Systems  Constitutional: Negative for fever.  Respiratory: Positive for shortness of breath. Negative for cough.   Cardiovascular: Positive for chest pain. Negative for leg swelling.  Gastrointestinal: Positive for abdominal pain. Negative for vomiting and diarrhea.  Genitourinary: Negative for dysuria.  Musculoskeletal:  Negative for back pain.  All other systems reviewed and are negative.    Allergies  Review of patient's allergies indicates no known allergies.  Home Medications   Current Outpatient Rx  Name  Route  Sig  Dispense  Refill  . ALPRAZolam (XANAX) 0.25 MG tablet   Oral   Take 0.25 mg by mouth daily as needed for anxiety.         Marland Kitchen amLODipine (NORVASC) 10 MG tablet   Oral   Take 10 mg by mouth daily.         Marland Kitchen aspirin EC 81 MG tablet   Oral   Take 81 mg by mouth daily.         . diclofenac (VOLTAREN) 75 MG EC tablet   Oral   Take 75 mg by mouth 2 (two) times daily.         Marland Kitchen lisinopril-hydrochlorothiazide (PRINZIDE,ZESTORETIC) 20-12.5 MG per tablet   Oral   Take 1 tablet by mouth 2 (two) times daily.          . metFORMIN (GLUCOPHAGE) 500 MG tablet   Oral   Take 500 mg by mouth daily with breakfast.          . nitroGLYCERIN (NITROSTAT) 0.4 MG SL tablet   Sublingual   Place 0.4 mg under the tongue every 5 (five)  minutes as needed. For chest pain         . omeprazole (PRILOSEC) 20 MG capsule   Oral   Take 20 mg by mouth daily.         . pravastatin (PRAVACHOL) 40 MG tablet   Oral   Take 40 mg by mouth daily.         . traMADol (ULTRAM) 50 MG tablet   Oral   Take 50-100 mg by mouth every 8 (eight) hours as needed for pain.          Temp(Src) 98.2 F (36.8 C) (Rectal) Physical Exam  Nursing note and vitals reviewed. Constitutional: She is oriented to person, place, and time. She appears well-developed and well-nourished.  HENT:  Head: Normocephalic and atraumatic.  Right Ear: External ear normal.  Left Ear: External ear normal.  Nose: Nose normal.  Eyes: Right eye exhibits no discharge. Left eye exhibits no discharge.  Cardiovascular: Normal rate, regular rhythm and normal heart sounds.   Pulmonary/Chest: Effort normal and breath sounds normal. She exhibits no tenderness.  Abdominal: Soft. There is generalized tenderness.  Neurological: She  is alert and oriented to person, place, and time.  Skin: Skin is warm and dry.    ED Course  Procedures (including critical care time) Labs Review Labs Reviewed  CBC - Abnormal; Notable for the following:    Hemoglobin 11.4 (*)    HCT 33.6 (*)    MCV 76.5 (*)    All other components within normal limits  BASIC METABOLIC PANEL - Abnormal; Notable for the following:    Potassium 3.4 (*)    Glucose, Bld 109 (*)    All other components within normal limits  HEPATIC FUNCTION PANEL - Abnormal; Notable for the following:    Total Bilirubin 0.2 (*)    All other components within normal limits  URINALYSIS, ROUTINE W REFLEX MICROSCOPIC - Abnormal; Notable for the following:    Hgb urine dipstick TRACE (*)    All other components within normal limits  LIPASE, BLOOD  URINE MICROSCOPIC-ADD ON  POCT I-STAT TROPONIN I  CG4 I-STAT (LACTIC ACID)   Imaging Review Dg Chest Port 1 View  10/11/2012   *RADIOLOGY REPORT*  Clinical Data:  PORTABLE CHEST - 1 VIEW  Comparison: 05/31/2011  Findings: Cardiac shadow is stable.  The lungs are clear.  No bony abnormality is noted.  IMPRESSION: No acute abnormality noted.   Original Report Authenticated By: Inez Catalina, M.D.    Date: 10/11/2012  Rate: 80  Rhythm: normal sinus rhythm  QRS Axis: normal  Intervals: normal  ST/T Wave abnormalities: T wave inversions inferiorly  Conduction Disutrbances:none  Narrative Interpretation: NSR with no acute changes  Old EKG Reviewed: unchanged   MDM   1. Chest pain   2. Abdominal pain    Patient has soft but diffusely tender abdomen. Did not appear overtly in pain, however, and has fallen asleep multiple times in the ED. Her pulses are normal. Feel that AAA or dissection is less likely. However, due to her comorbidities and tenderness will get a CT scan. She will need further evaluation of her chest pain as she's had an MI with similar symptoms. Her initial troponin is negative, however contact the hospitalist  to admit. Care transferred with plan to admit and for accepting physician to followup on CT scan these are any acute abnormalities.    Ephraim Hamburger, MD 10/11/12 [id number]

## 2012-10-11 NOTE — ED Notes (Signed)
EMS called out for c/o CP.  Pt rates pain 7/10 and states it feels the same as her previous MI pain.  Pt with hx of stent placement.  Pt took 1 SL Nitro prior to EMS arrival.  EMS administered 1 SL Nitro, 324mg  Aspirin, and 4mg  Zofran.  Pt states there has been no relief.

## 2012-10-12 ENCOUNTER — Encounter (HOSPITAL_COMMUNITY): Payer: Self-pay | Admitting: *Deleted

## 2012-10-12 DIAGNOSIS — E119 Type 2 diabetes mellitus without complications: Secondary | ICD-10-CM

## 2012-10-12 LAB — COMPREHENSIVE METABOLIC PANEL
Albumin: 3.8 g/dL (ref 3.5–5.2)
Alkaline Phosphatase: 63 U/L (ref 39–117)
Calcium: 9.9 mg/dL (ref 8.4–10.5)
Creatinine, Ser: 0.82 mg/dL (ref 0.50–1.10)
GFR calc Af Amer: 88 mL/min — ABNORMAL LOW (ref >90)
GFR calc non Af Amer: 76 mL/min — ABNORMAL LOW (ref >90)
Sodium: 137 mEq/L (ref 135–145)
Total Bilirubin: 0.3 mg/dL (ref 0.3–1.2)
Total Protein: 7.3 g/dL (ref 6.0–8.3)

## 2012-10-12 LAB — CBC
MCH: 25.3 pg — ABNORMAL LOW (ref 26.0–34.0)
MCV: 76.3 fL — ABNORMAL LOW (ref 78.0–100.0)
Platelets: 282 10*3/uL (ref 150–400)
RDW: 15.2 % (ref 11.5–15.5)
WBC: 8.4 10*3/uL (ref 4.0–10.5)

## 2012-10-12 LAB — GLUCOSE, CAPILLARY
Glucose-Capillary: 113 mg/dL — ABNORMAL HIGH (ref 70–99)
Glucose-Capillary: 191 mg/dL — ABNORMAL HIGH (ref 70–99)

## 2012-10-12 MED ORDER — POTASSIUM CHLORIDE CRYS ER 20 MEQ PO TBCR
40.0000 meq | EXTENDED_RELEASE_TABLET | Freq: Once | ORAL | Status: AC
  Administered 2012-10-12: 40 meq via ORAL
  Filled 2012-10-12: qty 2

## 2012-10-12 MED ORDER — SODIUM CHLORIDE 0.9 % IV SOLN
INTRAVENOUS | Status: DC
  Administered 2012-10-13: 09:00:00 via INTRAVENOUS

## 2012-10-12 NOTE — Consult Note (Addendum)
Cross cover [facility] Reason for Consult: Abdominal pain. Referring Physician: [facility]-Dr. Felipe Drone.  Joanne Morris is an 62 y.o. female.  HPI: 62 year old black female, with mutiple medical problems listed below, presents to the hospital with acute abdominal pain that started yesterday, predominantly in the epigastrium radiating to the retrosternal region. The patient is agitated at the time of my interview as she has a headache and claims she has been asking for "pain medications" for over 3 hours but nobody seems to be paying any attention to her. This apparently was not the case when I asked the patient's nurse. She claims she has regular BM's with no diarrhea or constipation, melena or hematochezia. As per Dr. Lorenza Burton note, this pain is not suspected to be cardiac in origin as  Her EKG is unchanged, cardiac enzymes are not elevated and CXR is normal. She has been taking NSAIDS for OA.   Past Medical History  Diagnosis Date  . Hypertension   . Diabetes mellitus   . Coronary artery disease   . Angina   . Myocardial infarction     per patient  . Stroke     per patient  . Arthritis     knee  per patient  . Depression    Past Surgical History  Procedure Laterality Date  . Coronary stent placement    . Abdominal hysterectomy    . Tubal ligation     History reviewed. No pertinent family history.  Social History:  reports that she quit smoking about 3 years ago. She has never used smokeless tobacco. She reports that she does not drink alcohol or use illicit drugs.  Allergies: No Known Allergies  Medications: I have reviewed the patient's current medications.  Results for orders placed during the hospital encounter of 10/11/12 (from the past 48 hour(s))  CBC     Status: Abnormal   Collection Time    10/11/12  1:00 PM      Result Value Range   WBC 9.3  4.0 - 10.5 K/uL   RBC 4.39  3.87 - 5.11 MIL/uL   Hemoglobin 11.4 (*) 12.0 - 15.0 g/dL   HCT 33.6 (*) 36.0 - 46.0 %   MCV 76.5 (*) 78.0 -  100.0 fL   MCH 26.0  26.0 - 34.0 pg   MCHC 33.9  30.0 - 36.0 g/dL   RDW 15.0  11.5 - 15.5 %   Platelets 294  150 - 400 K/uL  BASIC METABOLIC PANEL     Status: Abnormal   Collection Time    10/11/12  1:00 PM      Result Value Range   Sodium 140  135 - 145 mEq/L   Potassium 3.4 (*) 3.5 - 5.1 mEq/L   Chloride 100  96 - 112 mEq/L   CO2 26  19 - 32 mEq/L   Glucose, Bld 109 (*) 70 - 99 mg/dL   BUN 13  6 - 23 mg/dL   Creatinine, Ser 0.67  0.50 - 1.10 mg/dL   Calcium 10.1  8.4 - 10.5 mg/dL   GFR calc non Af Amer >90  >90 mL/min   GFR calc Af Amer >90  >90 mL/min   Comment: (NOTE)     The eGFR has been calculated using the CKD EPI equation.     This calculation has not been validated in all clinical situations.     eGFR's persistently <90 mL/min signify possible Chronic Kidney     Disease.  POCT I-STAT TROPONIN I  Status: None   Collection Time    10/11/12  1:24 PM      Result Value Range   Troponin i, poc 0.01  0.00 - 0.08 ng/mL   Comment 3            Comment: Due to the release kinetics of cTnI,     a negative result within the first hours     of the onset of symptoms does not rule out     myocardial infarction with certainty.     If myocardial infarction is still suspected,     repeat the test at appropriate intervals.  HEPATIC FUNCTION PANEL     Status: Abnormal   Collection Time    10/11/12  1:35 PM      Result Value Range   Total Protein 7.6  6.0 - 8.3 g/dL   Albumin 4.1  3.5 - 5.2 g/dL   AST 31  0 - 37 U/L   ALT 24  0 - 35 U/L   Alkaline Phosphatase 70  39 - 117 U/L   Total Bilirubin 0.2 (*) 0.3 - 1.2 mg/dL   Bilirubin, Direct <0.1  0.0 - 0.3 mg/dL   Indirect Bilirubin NOT CALCULATED  0.3 - 0.9 mg/dL  LIPASE, BLOOD     Status: None   Collection Time    10/11/12  1:35 PM      Result Value Range   Lipase 39  11 - 59 U/L  URINALYSIS, ROUTINE W REFLEX MICROSCOPIC     Status: Abnormal   Collection Time    10/11/12  1:35 PM      Result Value Range   Color, Urine  YELLOW  YELLOW   APPearance CLEAR  CLEAR   Specific Gravity, Urine 1.006  1.005 - 1.030   pH 7.0  5.0 - 8.0   Glucose, UA NEGATIVE  NEGATIVE mg/dL   Hgb urine dipstick TRACE (*) NEGATIVE   Bilirubin Urine NEGATIVE  NEGATIVE   Ketones, ur NEGATIVE  NEGATIVE mg/dL   Protein, ur NEGATIVE  NEGATIVE mg/dL   Urobilinogen, UA 0.2  0.0 - 1.0 mg/dL   Nitrite NEGATIVE  NEGATIVE   Leukocytes, UA NEGATIVE  NEGATIVE  URINE MICROSCOPIC-ADD ON     Status: None   Collection Time    10/11/12  1:35 PM      Result Value Range   Squamous Epithelial / LPF RARE  RARE   WBC, UA 0-2  <3 WBC/hpf   RBC / HPF 0-2  <3 RBC/hpf  CG4 I-STAT (LACTIC ACID)     Status: None   Collection Time    10/11/12  1:59 PM      Result Value Range   Lactic Acid, Venous 1.86  0.5 - 2.2 mmol/L  TROPONIN I     Status: None   Collection Time    10/11/12  7:43 PM      Result Value Range   Troponin I <0.30  <0.30 ng/mL   Comment:            Due to the release kinetics of cTnI,     a negative result within the first hours     of the onset of symptoms does not rule out     myocardial infarction with certainty.     If myocardial infarction is still suspected,     repeat the test at appropriate intervals.  LIPID PANEL     Status: Abnormal   Collection Time    10/11/12  7:43  PM      Result Value Range   Cholesterol 265 (*) 0 - 200 mg/dL   Triglycerides 437 (*) <150 mg/dL   HDL 52  >39 mg/dL   Total CHOL/HDL Ratio 5.1     VLDL UNABLE TO CALCULATE IF TRIGLYCERIDE OVER 400 mg/dL  0 - 40 mg/dL   LDL Cholesterol UNABLE TO CALCULATE IF TRIGLYCERIDE OVER 400 mg/dL  0 - 99 mg/dL   Comment:            Total Cholesterol/HDL:CHD Risk     Coronary Heart Disease Risk Table                         Men   Women      1/2 Average Risk   3.4   3.3      Average Risk       5.0   4.4      2 X Average Risk   9.6   7.1      3 X Average Risk  23.4   11.0                Use the calculated Patient Ratio     above and the CHD Risk Table     to  determine the patient's CHD Risk.                ATP III CLASSIFICATION (LDL):      <100     mg/dL   Optimal      100-129  mg/dL   Near or Above                        Optimal      130-159  mg/dL   Borderline      160-189  mg/dL   High      >190     mg/dL   Very High  GLUCOSE, CAPILLARY     Status: Abnormal   Collection Time    10/11/12  8:50 PM      Result Value Range   Glucose-Capillary 134 (*) 70 - 99 mg/dL  TROPONIN I     Status: None   Collection Time    10/11/12 11:30 PM      Result Value Range   Troponin I <0.30  <0.30 ng/mL   Comment:            Due to the release kinetics of cTnI,     a negative result within the first hours     of the onset of symptoms does not rule out     myocardial infarction with certainty.     If myocardial infarction is still suspected,     repeat the test at appropriate intervals.  CBC     Status: Abnormal   Collection Time    10/12/12  5:54 AM      Result Value Range   WBC 8.4  4.0 - 10.5 K/uL   RBC 4.38  3.87 - 5.11 MIL/uL   Hemoglobin 11.1 (*) 12.0 - 15.0 g/dL   HCT 33.4 (*) 36.0 - 46.0 %   MCV 76.3 (*) 78.0 - 100.0 fL   MCH 25.3 (*) 26.0 - 34.0 pg   MCHC 33.2  30.0 - 36.0 g/dL   RDW 15.2  11.5 - 15.5 %   Platelets 282  150 - 400 K/uL  COMPREHENSIVE METABOLIC PANEL     Status: Abnormal  Collection Time    10/12/12  5:54 AM      Result Value Range   Sodium 137  135 - 145 mEq/L   Potassium 3.3 (*) 3.5 - 5.1 mEq/L   Chloride 98  96 - 112 mEq/L   CO2 27  19 - 32 mEq/L   Glucose, Bld 110 (*) 70 - 99 mg/dL   BUN 18  6 - 23 mg/dL   Creatinine, Ser 0.82  0.50 - 1.10 mg/dL   Calcium 9.9  8.4 - 10.5 mg/dL   Total Protein 7.3  6.0 - 8.3 g/dL   Albumin 3.8  3.5 - 5.2 g/dL   AST 20  0 - 37 U/L   ALT 20  0 - 35 U/L   Alkaline Phosphatase 63  39 - 117 U/L   Total Bilirubin 0.3  0.3 - 1.2 mg/dL   GFR calc non Af Amer 76 (*) >90 mL/min   GFR calc Af Amer 88 (*) >90 mL/min   Comment: (NOTE)     The eGFR has been calculated using the CKD  EPI equation.     This calculation has not been validated in all clinical situations.     eGFR's persistently <90 mL/min signify possible Chronic Kidney     Disease.  TROPONIN I     Status: None   Collection Time    10/12/12  5:54 AM      Result Value Range   Troponin I <0.30  <0.30 ng/mL   Comment:            Due to the release kinetics of cTnI,     a negative result within the first hours     of the onset of symptoms does not rule out     myocardial infarction with certainty.     If myocardial infarction is still suspected,     repeat the test at appropriate intervals.  GLUCOSE, CAPILLARY     Status: Abnormal   Collection Time    10/12/12  7:43 AM      Result Value Range   Glucose-Capillary 113 (*) 70 - 99 mg/dL   Comment 1 Notify RN    GLUCOSE, CAPILLARY     Status: Abnormal   Collection Time    10/12/12 11:49 AM      Result Value Range   Glucose-Capillary 124 (*) 70 - 99 mg/dL   Comment 1 Notify RN      Ct Abdomen Pelvis W Contrast  10/11/2012   *RADIOLOGY REPORT*  Clinical Data: Right-sided abdominal pain.  CT ABDOMEN AND PELVIS WITH CONTRAST  Technique:  Multidetector CT imaging of the abdomen and pelvis was performed following the standard protocol during bolus administration of intravenous contrast.  Contrast:  100 ml Omnipaque-300 IV  Comparison: 02/18/2007  Findings: Since the prior study, there is increased diffuse fatty infiltration of the liver.  No hepatic masses or biliary ductal dilatation are identified.  The gallbladder, pancreas, spleen, adrenal glands and kidneys are within normal limits.  There is no evidence of bowel obstruction or inflammatory process. No free air, free fluid or focal abscess is identified.  There is no evidence to suggest appendicitis by CT.  No masses or enlarged lymph nodes are seen.  No hernias are seen. Mild degenerative changes are present in the lumbar spine.  IMPRESSION:  1.  Progressive hepatic steatosis since the prior CT study. 2.  No  acute findings in the abdomen or pelvis.   Original Report Authenticated By: Eulas Post  Joanne Morris, M.D.   Dg Chest Port 1 View  10/11/2012   *RADIOLOGY REPORT*  Clinical Data:  PORTABLE CHEST - 1 VIEW  Comparison: 05/31/2011  Findings: Cardiac shadow is stable.  The lungs are clear.  No bony abnormality is noted.  IMPRESSION: No acute abnormality noted.   Original Report Authenticated By: Inez Catalina, M.D.   Review of Systems  Constitutional: Positive for malaise/fatigue. Negative for fever, chills, weight loss and diaphoresis.  HENT: Negative for hearing loss, ear pain, nosebleeds, congestion, sore throat, tinnitus and ear discharge.   Eyes: Negative.   Respiratory: Negative for stridor.   Cardiovascular: Positive for chest pain. Negative for palpitations, orthopnea, claudication, leg swelling and PND.  Gastrointestinal: Positive for abdominal pain. Negative for nausea, vomiting, diarrhea, constipation, blood in stool and melena.  Genitourinary: Negative.   Musculoskeletal: Positive for joint pain.  Skin: Negative.   Neurological: Negative.   Psychiatric/Behavioral: Positive for depression. Negative for suicidal ideas, hallucinations and substance abuse. The patient is nervous/anxious.    Blood pressure 120/54, pulse 70, temperature 98.9 F (37.2 C), temperature source Oral, resp. rate 20, height 4\' 1"  (1.245 m), weight 57.924 kg (127 lb 11.2 oz), SpO2 99.00%. Physical Exam  Constitutional: She is oriented to person, place, and time. She appears well-developed and well-nourished.  HENT:  Head: Normocephalic and atraumatic.  Eyes: Conjunctivae and EOM are normal. Pupils are equal, round, and reactive to light.  Neck: Normal range of motion. Neck supple.  Cardiovascular: Normal rate and regular rhythm.   Respiratory: Effort normal and breath sounds normal.  GI: Soft. Bowel sounds are normal. She exhibits no distension and no mass. There is tenderness. There is no rebound and no guarding.   Musculoskeletal: Normal range of motion.  Neurological: She is alert and oriented to person, place, and time.  Skin: Skin is warm and dry.  Psychiatric: Judgment and thought content normal.  She is very angry and agitated as she feels the nurse is not responding to her request for pain medication   Assessment/Plan: 1) Epigastric pain with retrosternal discomfort/mild anemia: Will schedule her for an EGD tomorrow. If the EGD is unrevealing, she should have an abdominal ultrasound to rule out gallbladder disease. On Protonix. Check anemia panel.  2) Hyperlipidemia on Zocor.  3) CAD/HTN on Lisinopril.  4) IDDM Neave Lenger 10/12/2012, 4:26 PM

## 2012-10-12 NOTE — Progress Notes (Signed)
[facility] PROGRESS NOTE  Joanne Morris [id number] DOB: 1951/01/10 DOA: 10/11/2012 PCP: Philis Fendt, MD  Assessment/Plan: Active Problems:   Abdominal pain   Dyslipidemia   Diabetes mellitus   Depression   Radiculopathy of leg    1. Atypical Chest Pain: Patient presented with a typical sounding chest discomfort, described as retrosternal tightness, radiating to right shoulder. Reportedly, this lasted only about 5-10 minutes, but on examination, she has moderate epigastric tenderness to palpation, which she says is exactly the pain she had experienced. CXR is devoid of active disease, 12-lead EKG shows infero-lateral T-wave flattening/inversion, but this is unchanged from EKG of 07/2011. Cardiac enzymes are unelevated. Telemetry has revealed no arrhythmia. Chest pain appears to be non-cardiac. Will not pursue further cardiac testing at this time. .  2. Abdominal pain: As described above, patient does have moderate epigastric tenderness to palpation, although she has no vomiting or diarrhea. Fortunately, Lipase is normal at 39. Patient takes NSAIDS for OA, so the specter of peptic ulcer disease or erosive gastritis is raised. During patient's hospitalization in 05/2011, she was thought to have GERD, outpatient follow up with Dr Carol Ada for EGD was arranged, but it appears that patient never followed up. NSAIDS are on hold, and patient is on PPI. Abdominal CT scan was negative for acute findings. Have consulted Dr Juanita Craver, GI, for possible EGD.  3. CAD: Patient has known history of CAD, s/p MI, s/p PCI/Stent 6 years ago at [facility], and is s/p negative stress Myoview in 05/2011. She saw Dr Dorris Carnes in the office in 07/2011, and has not followed up since. Will manage as described in #1.  4. HTN: Controlled at this time.  5. Dyslipidemia: Continued on pre-admission lipid-lowering medication, and check lipid profile. Lipid profile showed hypertriglyceridemia, with TC 265,  TG 437.  6. Diabetes mellitus: This is type 2, and appears controlled, based on random blood glucose of 109. Oral hypoglycemics are on hold for now. Managing with diet/SSI.  7. Depression: Stable.  8. Radiculopathy of leg: Patient has been troubled by pain behind right leg, on ambulation. As straight leg-raising test is positive, and she has good peripheral pulses, suspect a lumbar radiculopathy. Have ordered lumbar MRI.    Code Status: Full Code.  Family Communication:  Disposition Plan: To be determined.    Brief narrative: This is a 62 year old female, with known history of HTN, DM-2, CAD, s/p MI, s/p PCI/Stent 6 years ago at [facility], s/p CVA, dyslipidemia, depression, query GERD, OA. Patient is s/p hospitalization at [facility] 05/31/11-06/03/11, for chest pain, and at that time, had negative stress Myoview. According to patient, she was quite ok all day on 10/10/12, and went to bed at her usual time. She awoke at about 6:05 AM on 10/11/12, with retrosternal chest "tightness", radiating to her right shoulder, associated sweating and nausea, lasting about 5-10 minutes. Patient called 911, and was brought to the ED. Patient has been experiencing pain behind her right leg on ambulation, for about a year now. She denies low back pain. According to her, the PMD gave her some pills for this, but these did not help. Admitted for further evaluation and management.    Consultants:  Dr Juanita Craver, GI.   Procedures: Abdominal CT scan.   Antibiotics:  N/A.   HPI/Subjective: Still has upper abdominal pain.   Objective: Vital signs in last 24 hours: Temp:  [97.9 F (36.6 C)-98.7 F (37.1 C)] 97.9 F (36.6 C) (09/01  0630) Pulse Rate:  [56-86] 56 (09/01 0630) Resp:  [12-26] 18 (09/01 0630) BP: (108-171)/(53-90) 130/65 mmHg (09/01 1011) SpO2:  [95 %-100 %] 99 % (09/01 0630) Weight:  [57.924 kg (127 lb 11.2 oz)] 57.924 kg (127 lb 11.2 oz) (08/31 1835) Weight change:  Last BM Date:  10/11/12  Intake/Output from previous day: 08/31 0701 - 09/01 0700 In: 240 [P.O.:240] Out: -  Total I/O In: 363 [P.O.:360; I.V.:3] Out: -    Physical Exam: General: Alert, communicative, fully oriented, talking in complete sentences, not short of breath at rest.  HEENT: Mild clinical pallor, no jaundice, no conjunctival injection or discharge. Hydration is fair.  NECK: Supple, JVP not seen, no carotid bruits, no palpable lymphadenopathy, no palpable goiter.  CHEST: Clinically clear to auscultation, no wheezes, no crackles.  HEART: Sounds 1 and 2 heard, normal, regular, no murmurs.  ABDOMEN: Full, soft, moderately tender in the epigastrium, no palpable organomegaly, no palpable masses, normal bowel sounds.  GENITALIA: Not examined.  LOWER EXTREMITIES: No pitting edema, palpable peripheral pulses.  MUSCULOSKELETAL SYSTEM: Generalized osteoarthritic changes, otherwise, normal. Straight-leg raising test is positive on right.  CENTRAL NERVOUS SYSTEM: No focal neurologic deficit on gross examination.  Lab Results:  Recent Labs  10/11/12 1300 10/12/12 0554  WBC 9.3 8.4  HGB 11.4* 11.1*  HCT 33.6* 33.4*  PLT 294 282    Recent Labs  10/11/12 1300 10/12/12 0554  NA 140 137  K 3.4* 3.3*  CL 100 98  CO2 26 27  GLUCOSE 109* 110*  BUN 13 18  CREATININE 0.67 0.82  CALCIUM 10.1 9.9   No results found for this or any previous visit (from the past 240 hour(s)).   Studies/Results: Ct Abdomen Pelvis W Contrast  10/11/2012   *RADIOLOGY REPORT*  Clinical Data: Right-sided abdominal pain.  CT ABDOMEN AND PELVIS WITH CONTRAST  Technique:  Multidetector CT imaging of the abdomen and pelvis was performed following the standard protocol during bolus administration of intravenous contrast.  Contrast:  100 ml Omnipaque-300 IV  Comparison: 02/18/2007  Findings: Since the prior study, there is increased diffuse fatty infiltration of the liver.  No hepatic masses or biliary ductal dilatation are  identified.  The gallbladder, pancreas, spleen, adrenal glands and kidneys are within normal limits.  There is no evidence of bowel obstruction or inflammatory process. No free air, free fluid or focal abscess is identified.  There is no evidence to suggest appendicitis by CT.  No masses or enlarged lymph nodes are seen.  No hernias are seen. Mild degenerative changes are present in the lumbar spine.  IMPRESSION:  1.  Progressive hepatic steatosis since the prior CT study. 2.  No acute findings in the abdomen or pelvis.   Original Report Authenticated By: Aletta Edouard, M.D.   Dg Chest Port 1 View  10/11/2012   *RADIOLOGY REPORT*  Clinical Data:  PORTABLE CHEST - 1 VIEW  Comparison: 05/31/2011  Findings: Cardiac shadow is stable.  The lungs are clear.  No bony abnormality is noted.  IMPRESSION: No acute abnormality noted.   Original Report Authenticated By: Inez Catalina, M.D.    Medications: Scheduled Meds: . amLODipine  10 mg Oral Daily  . aspirin EC  325 mg Oral Daily  . heparin  5,000 Units Subcutaneous Q8H  . lisinopril  20 mg Oral BID   And  . hydrochlorothiazide  12.5 mg Oral BID  . insulin aspart  0-5 Units Subcutaneous QHS  . insulin aspart  0-9 Units Subcutaneous TID WC  .  pantoprazole (PROTONIX) IV  40 mg Intravenous Q12H  . simvastatin  20 mg Oral q1800  . sodium chloride  3 mL Intravenous Q12H  . sodium chloride  3 mL Intravenous Q12H   Continuous Infusions:  PRN Meds:.sodium chloride, acetaminophen, acetaminophen, ALPRAZolam, diphenhydrAMINE, morphine injection, nitroGLYCERIN, ondansetron (ZOFRAN) IV, ondansetron, oxyCODONE, sodium chloride    LOS: 1 day   Jeda Pardue,CHRISTOPHER  [facility] Pager [phone]. If 8PM-8AM, please contact night-coverage at [email or web address], password [facility] 10/12/2012, 11:43 AM  LOS: 1 day

## 2012-10-13 ENCOUNTER — Other Ambulatory Visit: Payer: Self-pay | Admitting: Physician Assistant

## 2012-10-13 ENCOUNTER — Encounter (HOSPITAL_COMMUNITY)
Admission: EM | Disposition: A | Discharge status: Home / Self Care | Payer: Self-pay | Source: Home / Self Care | Attending: Emergency Medicine

## 2012-10-13 ENCOUNTER — Observation Stay (HOSPITAL_COMMUNITY): Payer: Medicare Other

## 2012-10-13 ENCOUNTER — Encounter (HOSPITAL_COMMUNITY): Payer: Self-pay

## 2012-10-13 DIAGNOSIS — R1013 Epigastric pain: Secondary | ICD-10-CM

## 2012-10-13 DIAGNOSIS — R079 Chest pain, unspecified: Secondary | ICD-10-CM

## 2012-10-13 HISTORY — PX: ESOPHAGOGASTRODUODENOSCOPY: SHX5428

## 2012-10-13 LAB — CBC
MCH: 25.2 pg — ABNORMAL LOW (ref 26.0–34.0)
MCV: 76.7 fL — ABNORMAL LOW (ref 78.0–100.0)
Platelets: 275 10*3/uL (ref 150–400)
RBC: 4.29 MIL/uL (ref 3.87–5.11)

## 2012-10-13 LAB — BASIC METABOLIC PANEL
CO2: 29 mEq/L (ref 19–32)
Calcium: 10.1 mg/dL (ref 8.4–10.5)
Creatinine, Ser: 0.9 mg/dL (ref 0.50–1.10)
Glucose, Bld: 122 mg/dL — ABNORMAL HIGH (ref 70–99)

## 2012-10-13 LAB — FERRITIN: Ferritin: 266 ng/mL (ref 10–291)

## 2012-10-13 LAB — IRON AND TIBC
TIBC: 253 ug/dL (ref 250–470)
UIBC: 180 ug/dL (ref 125–400)

## 2012-10-13 LAB — GLUCOSE, CAPILLARY: Glucose-Capillary: 194 mg/dL — ABNORMAL HIGH (ref 70–99)

## 2012-10-13 SURGERY — EGD (ESOPHAGOGASTRODUODENOSCOPY)
Anesthesia: Moderate Sedation

## 2012-10-13 MED ORDER — FENTANYL CITRATE 0.05 MG/ML IJ SOLN
INTRAMUSCULAR | Status: DC | PRN
  Administered 2012-10-13 (×4): 25 ug via INTRAVENOUS

## 2012-10-13 MED ORDER — FENTANYL CITRATE 0.05 MG/ML IJ SOLN
INTRAMUSCULAR | Status: AC
  Filled 2012-10-13: qty 2

## 2012-10-13 MED ORDER — MIDAZOLAM HCL 5 MG/ML IJ SOLN
INTRAMUSCULAR | Status: AC
  Filled 2012-10-13: qty 2

## 2012-10-13 MED ORDER — BUTAMBEN-TETRACAINE-BENZOCAINE 2-2-14 % EX AERO
INHALATION_SPRAY | CUTANEOUS | Status: DC | PRN
  Administered 2012-10-13: 2 via TOPICAL

## 2012-10-13 MED ORDER — GADOBENATE DIMEGLUMINE 529 MG/ML IV SOLN: 15.0000 mL | Freq: Once | INTRAVENOUS | Status: DC | PRN

## 2012-10-13 MED ORDER — MIDAZOLAM HCL 10 MG/2ML IJ SOLN
INTRAMUSCULAR | Status: DC | PRN
  Administered 2012-10-13 (×3): 2 mg via INTRAVENOUS

## 2012-10-13 MED ORDER — BISACODYL 10 MG RE SUPP
10.0000 mg | Freq: Once | RECTAL | Status: AC
  Administered 2012-10-13: 10 mg via RECTAL
  Filled 2012-10-13: qty 1

## 2012-10-13 NOTE — Progress Notes (Signed)
Utilization review complete. Colby Catanese RN CCM Case Mgmt phone [phone] 

## 2012-10-13 NOTE — Progress Notes (Signed)
Pt has ultrasound appt at [facility] for 9/5 at [place] with Dr Carlean Purl for 10/2 at 11 AM This info and details entered into Provider navigator.  Joanne Morris

## 2012-10-13 NOTE — Op Note (Signed)
[facility] [place], [id number]   ENDOSCOPY PROCEDURE REPORT  PATIENT: Joanne, Morris  MR#: [id number] BIRTHDATE: 02/15/1950 , 61  yrs. old GENDER: Female ENDOSCOPIST: Gatha Mayer, MD, [facility] REFERRED BY:  [facility] PROCEDURE DATE:  10/13/2012 PROCEDURE:  EGD, diagnostic ASA CLASS:     Class II INDICATIONS:  Chest pain.   Epigastric pain. MEDICATIONS: Fentanyl 100 mcg IV and Versed 8 mg IV TOPICAL ANESTHETIC: Cetacaine Spray  DESCRIPTION OF PROCEDURE: After the risks benefits and alternatives of the procedure were thoroughly explained, informed consent was obtained.  The Pentax Gastroscope [id number] endoscope was introduced through the mouth and advanced to the second portion of the duodenum. Without limitations.  The instrument was slowly withdrawn as the mucosa was fully examined.      The upper, middle and distal third of the esophagus were carefully inspected and no abnormalities were noted.  The z-line was well seen at the GEJ.  The endoscope was pushed into the fundus which was normal including a retroflexed view.  The antrum, gastric body, first and second part of the duodenum were unremarkable. Retroflexed views revealed no abnormalities.     The scope was then withdrawn from the patient and the procedure completed.  COMPLICATIONS: There were no complications. ENDOSCOPIC IMPRESSION: Normal EGD cause of chest and epigastric pain not found.  RECOMMENDATIONS: I have ordered a diet. She should have an [place] of abdomen but this can be done as an outpatient with f/u me in office.  So unless other issues I think she could go home today. Contact GI PA to arrange outpatient f/u as needed. I think she also needs a screening colonoscopy which I can arrange as outpatient.   eSigned:  Gatha Mayer, MD, [facility] 10/13/2012 9:25 AM   [id number] Jeanie Cooks, MD

## 2012-10-13 NOTE — Discharge Summary (Addendum)
Physician Discharge Summary  Kathe Campa [id number] DOB: 06-Mar-1950 DOA: 10/11/2012  PCP: Philis Fendt, MD  Admit date: 10/11/2012 Discharge date: 10/13/2012  Time spent: 40 minutes  Recommendations for Outpatient Follow-up:  1. Follow up with PMD.  2. Follow up with Dr Dorris Carnes, primary cardiologist. 3. Follow up with Dr Silvano Rusk, GI.  4. Dr Carlean Purl will arrange abdominal Ultrasound on follow up.   Discharge Diagnoses:  Active Problems:   Abdominal pain   Dyslipidemia   Diabetes mellitus   Depression   Radiculopathy of leg   Discharge Condition: Satisfactory.   Diet recommendation: Heart-Healthy/Carbohydrate-Modified.   Filed Weights   10/11/12 1835  Weight: 57.924 kg (127 lb 11.2 oz)    History of present illness:  This is a 62 year old Morris, with known history of HTN, DM-2, CAD, s/p MI, s/p PCI/Stent 6 years ago at [facility], s/p CVA, dyslipidemia, depression, query GERD, OA. Patient is s/p hospitalization at [facility] 05/31/11-06/03/11, for chest pain, and at that time, had negative stress Myoview. According to patient, she was quite ok all day on 10/10/12, and went to bed at her usual time. She awoke at about 6:05 AM on 10/11/12, with retrosternal chest "tightness", radiating to her right shoulder, associated sweating and nausea, lasting about 5-10 minutes. Patient called 911, and was brought to the ED. Patient has been experiencing pain behind her right leg on ambulation, for about a year now. She denies low back pain. According to her, the PMD gave her some pills for this, but these did not help. Admitted for further evaluation and management.    Hospital Course:  1. Atypical Chest Pain: Patient presented with a typical sounding chest discomfort, described as retrosternal tightness, radiating to right shoulder. Reportedly, this lasted only about 5-10 minutes, but on examination, she has moderate epigastric tenderness to palpation, which she says is exactly the  pain she had experienced. CXR was devoid of active disease, 12-lead EKG showed infero-lateral T-wave flattening/inversion, but this is unchanged from EKG of 07/2011. Cardiac enzymes remained unelevated. Telemetric monitoring revealed no arrhythmia. Chest pain appears to be non-cardiac. No further cardiac testing at this time. Patient will continue to follow up with her primary cardiologist.  2. Abdominal pain: As described above, patient did have moderate epigastric tenderness to palpation, although she had no vomiting or diarrhea. Lipase was normal at 39.  Abdominal CT scan was negative for acute findings. Patient takes NSAIDS for OA, so the likelihood  of peptic ulcer disease or erosive gastritis had to be considered. During patient's hospitalization in 05/2011, she was thought to have GERD, outpatient follow up with Dr Carol Ada for EGD was arranged, but it appears that patient never followed up. NSAIDS were held, and patient was placed on PPI. Dr Juanita Craver provided GI consultation, and Dr Silvano Rusk performed an EGD on 10/13/12, which was entirely normal. Patient will follow up with DR Carlean Purl on discharge, who will arrange an outpatient abdominal U/S.  3. CAD: Patient has known history of CAD, s/p MI, s/p PCI/Stent 6 years ago at [facility], and is s/p negative stress Myoview in 05/2011. She saw Dr Dorris Carnes in the office in 07/2011, and has not followed up since. See #1 above.   4. HTN: Controlled at this time.  5. Dyslipidemia: Continued on pre-admission lipid-lowering medication, and check lipid profile. Lipid profile showed hypertriglyceridemia, with TC 265, TG 437.  6. Diabetes mellitus: This is type 2, and appears controlled, based on random  blood glucose of 109. Oral hypoglycemics were temporarily held. CBGs were controlled with SSI, during hospitalization. Pre-admission oral hypoglycemics, reinstated on discharge.  7. Depression: Stable.  8. Radiculopathy of leg: Patient has been  troubled by pain behind right leg, on ambulation. As straight leg-raising test is positive, and she has good peripheral pulses, suspected a lumbar radiculopathy. Lumbar MRI done on 10/13/12 showed slight degenerative disc disease at L2-3, L3-4, and L5 S1 without neural impingement. Facet arthritis at L4-5 on the right and bilaterally at L5-S1.    Procedures:  See Below.   Consultations: Dr Juanita Craver, GI. Dr Silvano Rusk, GI.   Discharge Exam: Filed Vitals:   10/13/12 1017  BP: 124/62  Pulse:   Temp:   Resp:     General: Alert, communicative, fully oriented, talking in complete sentences, not short of breath at rest.  HEENT: Mild clinical pallor, no jaundice, no conjunctival injection or discharge. Hydration is fair.  NECK: Supple, JVP not seen, no carotid bruits, no palpable lymphadenopathy, no palpable goiter.  CHEST: Clinically clear to auscultation, no wheezes, no crackles.  HEART: Sounds 1 and 2 heard, normal, regular, no murmurs.  ABDOMEN: Full, soft, moderately tender in the epigastrium, no palpable organomegaly, no palpable masses, normal bowel sounds.  GENITALIA: Not examined.  LOWER EXTREMITIES: No pitting edema, palpable peripheral pulses.  MUSCULOSKELETAL SYSTEM: Generalized osteoarthritic changes, otherwise, normal. Straight-leg raising test is positive on right.  CENTRAL NERVOUS SYSTEM: No focal neurologic deficit on gross examination.  Discharge Instructions      Discharge Orders   Future Appointments Provider Department Dept Phone   10/16/2012 9:30 AM [id number] [place] [facility] [place] [id number]   NPO after midnight.   11/12/2012 11:00 AM Gatha Mayer, MD [facility] [phone]   Future Orders Complete By Expires   Diet - low sodium heart healthy  As directed    Diet Carb Modified  As directed    Increase activity slowly  As directed        Medication List    STOP taking these medications       diclofenac  75 MG EC tablet  Commonly known as:  VOLTAREN      TAKE these medications       ALPRAZolam 0.25 MG tablet  Commonly known as:  XANAX  Take 0.25 mg by mouth daily as needed for anxiety.     amLODipine 10 MG tablet  Commonly known as:  NORVASC  Take 10 mg by mouth daily.     aspirin EC 81 MG tablet  Take 81 mg by mouth daily.     lisinopril-hydrochlorothiazide 20-12.5 MG per tablet  Commonly known as:  PRINZIDE,ZESTORETIC  Take 1 tablet by mouth 2 (two) times daily.     metFORMIN 500 MG tablet  Commonly known as:  GLUCOPHAGE  Take 500 mg by mouth daily with breakfast.     nitroGLYCERIN 0.4 MG SL tablet  Commonly known as:  NITROSTAT  Place 0.4 mg under the tongue every 5 (five) minutes as needed. For chest pain     omeprazole 20 MG capsule  Commonly known as:  PRILOSEC  Take 20 mg by mouth daily.     pravastatin 40 MG tablet  Commonly known as:  PRAVACHOL  Take 40 mg by mouth daily.     traMADol 50 MG tablet  Commonly known as:  ULTRAM  Take 50-100 mg by mouth every 8 (eight) hours as needed for pain.  No Known Allergies Follow-up Information   Follow up with [facility] On 10/16/2012. ([facility] [address] at 9:15 AM.   nothing to eat or drink (including water) after midnight the night before, otherwise they can not do the test.  phone # there is [phone].)    Contact information:   [place]       Follow up with Silvano Rusk, MD On 11/12/2012. (11 AM.  please have the courtesy to call and cancel or reschedule appaointment if you can not get there on that day. )    Specialty:  Gastroenterology   Contact information:   [address] [place] [address] [phone]       Call Philis Fendt, MD.   Specialty:  Internal Medicine   Contact information:   [place] [address] [phone]       Schedule an appointment as soon as possible for a visit with Dorris Carnes, MD.   Specialty:  Cardiology   Contact information:   [address] [place] [address] [phone]        The results of significant diagnostics from this hospitalization (including imaging, microbiology, ancillary and laboratory) are listed below for reference.    Significant Diagnostic Studies: Ct Abdomen Pelvis W Contrast  10/11/2012   *RADIOLOGY REPORT*  Clinical Data: Right-sided abdominal pain.  CT ABDOMEN AND PELVIS WITH CONTRAST  Technique:  Multidetector CT imaging of the abdomen and pelvis was performed following the standard protocol during bolus administration of intravenous contrast.  Contrast:  100 ml Omnipaque-300 IV  Comparison: 02/18/2007  Findings: Since the prior study, there is increased diffuse fatty infiltration of the liver.  No hepatic masses or biliary ductal dilatation are identified.  The gallbladder, pancreas, spleen, adrenal glands and kidneys are within normal limits.  There is no evidence of bowel obstruction or inflammatory process. No free air, free fluid or focal abscess is identified.  There is no evidence to suggest appendicitis by CT.  No masses or enlarged lymph nodes are seen.  No hernias are seen. Mild degenerative changes are present in the lumbar spine.  IMPRESSION:  1.  Progressive hepatic steatosis since the prior CT study. 2.  No acute findings in the abdomen or pelvis.   Original Report Authenticated By: Aletta Edouard, M.D.   Dg Chest Port 1 View  10/11/2012   *RADIOLOGY REPORT*  Clinical Data:  PORTABLE CHEST - 1 VIEW  Comparison: 05/31/2011  Findings: Cardiac shadow is stable.  The lungs are clear.  No bony abnormality is noted.  IMPRESSION: No acute abnormality noted.   Original Report Authenticated By: Inez Catalina, M.D.    Microbiology: No results found for this or any previous visit (from the past 240 hour(s)).   Labs: Basic Metabolic Panel:  Recent Labs Lab 10/11/12 1300 10/12/12 0554 10/13/12 0500  NA 140 137 140  K 3.4* 3.3* 3.7  CL 100 98 101  CO2 26 27 29   GLUCOSE  109* 110* 122*  BUN 13 18 18   CREATININE 0.67 0.82 0.90  CALCIUM 10.1 9.9 10.1   Liver Function Tests:  Recent Labs Lab 10/11/12 1335 10/12/12 0554  AST 31 20  ALT 24 20  ALKPHOS 70 63  BILITOT 0.2* 0.3  PROT 7.6 7.3  ALBUMIN 4.1 3.8    Recent Labs Lab 10/11/12 1335  LIPASE 39   No results found for this basename: AMMONIA,  in the last 168 hours CBC:  Recent Labs Lab 10/11/12 1300  10/12/12 0554 10/13/12 0500  WBC 9.3 8.4 9.0  HGB 11.4* 11.1* 10.8*  HCT 33.6* 33.4* 32.9*  MCV 76.5* 76.3* 76.7*  PLT 294 282 275   Cardiac Enzymes:  Recent Labs Lab 10/11/12 1943 10/11/12 2330 10/12/12 0554  TROPONINI <0.30 <0.30 <0.30   BNP: BNP (last 3 results) No results found for this basename: PROBNP,  in the last 8760 hours CBG:  Recent Labs Lab 10/12/12 1149 10/12/12 1709 10/12/12 2122 10/13/12 0734 10/13/12 1127  GLUCAP 124* 112* 191* 139* 194*       Signed:  Daphney Hopke,CHRISTOPHER  [facility] 10/13/2012, 1:12 PM

## 2012-10-14 ENCOUNTER — Encounter (HOSPITAL_COMMUNITY): Payer: Self-pay | Admitting: Internal Medicine

## 2012-10-16 ENCOUNTER — Other Ambulatory Visit: Payer: Medicare Other

## 2012-10-20 ENCOUNTER — Other Ambulatory Visit: Payer: Medicare Other

## 2012-11-12 ENCOUNTER — Ambulatory Visit: Payer: Medicare Other | Admitting: Internal Medicine

## 2012-11-25 ENCOUNTER — Other Ambulatory Visit: Payer: Medicare Other

## 2013-03-23 ENCOUNTER — Other Ambulatory Visit (HOSPITAL_COMMUNITY): Payer: Self-pay | Admitting: Internal Medicine

## 2013-03-23 DIAGNOSIS — I739 Peripheral vascular disease, unspecified: Secondary | ICD-10-CM

## 2013-03-25 ENCOUNTER — Ambulatory Visit (HOSPITAL_COMMUNITY): Payer: Medicare Other

## 2013-03-29 ENCOUNTER — Ambulatory Visit (HOSPITAL_COMMUNITY)
Admission: RE | Admit: 2013-03-29 | Discharge: 2013-03-29 | Disposition: A | Discharge status: Home / Self Care | Payer: Medicare HMO | Source: Ambulatory Visit | Attending: Internal Medicine | Admitting: Internal Medicine

## 2013-03-29 DIAGNOSIS — I739 Peripheral vascular disease, unspecified: Secondary | ICD-10-CM | POA: Insufficient documentation

## 2013-03-29 DIAGNOSIS — E119 Type 2 diabetes mellitus without complications: Secondary | ICD-10-CM | POA: Insufficient documentation

## 2013-03-29 DIAGNOSIS — I10 Essential (primary) hypertension: Secondary | ICD-10-CM | POA: Insufficient documentation

## 2013-03-29 DIAGNOSIS — E785 Hyperlipidemia, unspecified: Secondary | ICD-10-CM

## 2013-03-29 NOTE — Progress Notes (Signed)
VASCULAR LAB PRELIMINARY  ARTERIAL  ABI completed:    RIGHT    LEFT    PRESSURE WAVEFORM  PRESSURE WAVEFORM  BRACHIAL 157 triphasic BRACHIAL 163 triphasic  DP   DP    AT 98 monophasic AT 150 Dampened monophasic  PT 113 monophasic PT 118 Dampened monophasic  PER   PER    GREAT TOE  NA GREAT TOE  NA    RIGHT LEFT  ABI 0.69 0.92     [place], RVT 03/29/2013, 10:20 AM

## 2013-04-15 IMAGING — CR DG CHEST 2V
2 series · 2 of 2 positions shown · non-contrast
Comparison: None

CLINICAL DATA: Chest pain, fever, night sweats, cough

CHEST - 2 VIEW

[w chest pa]
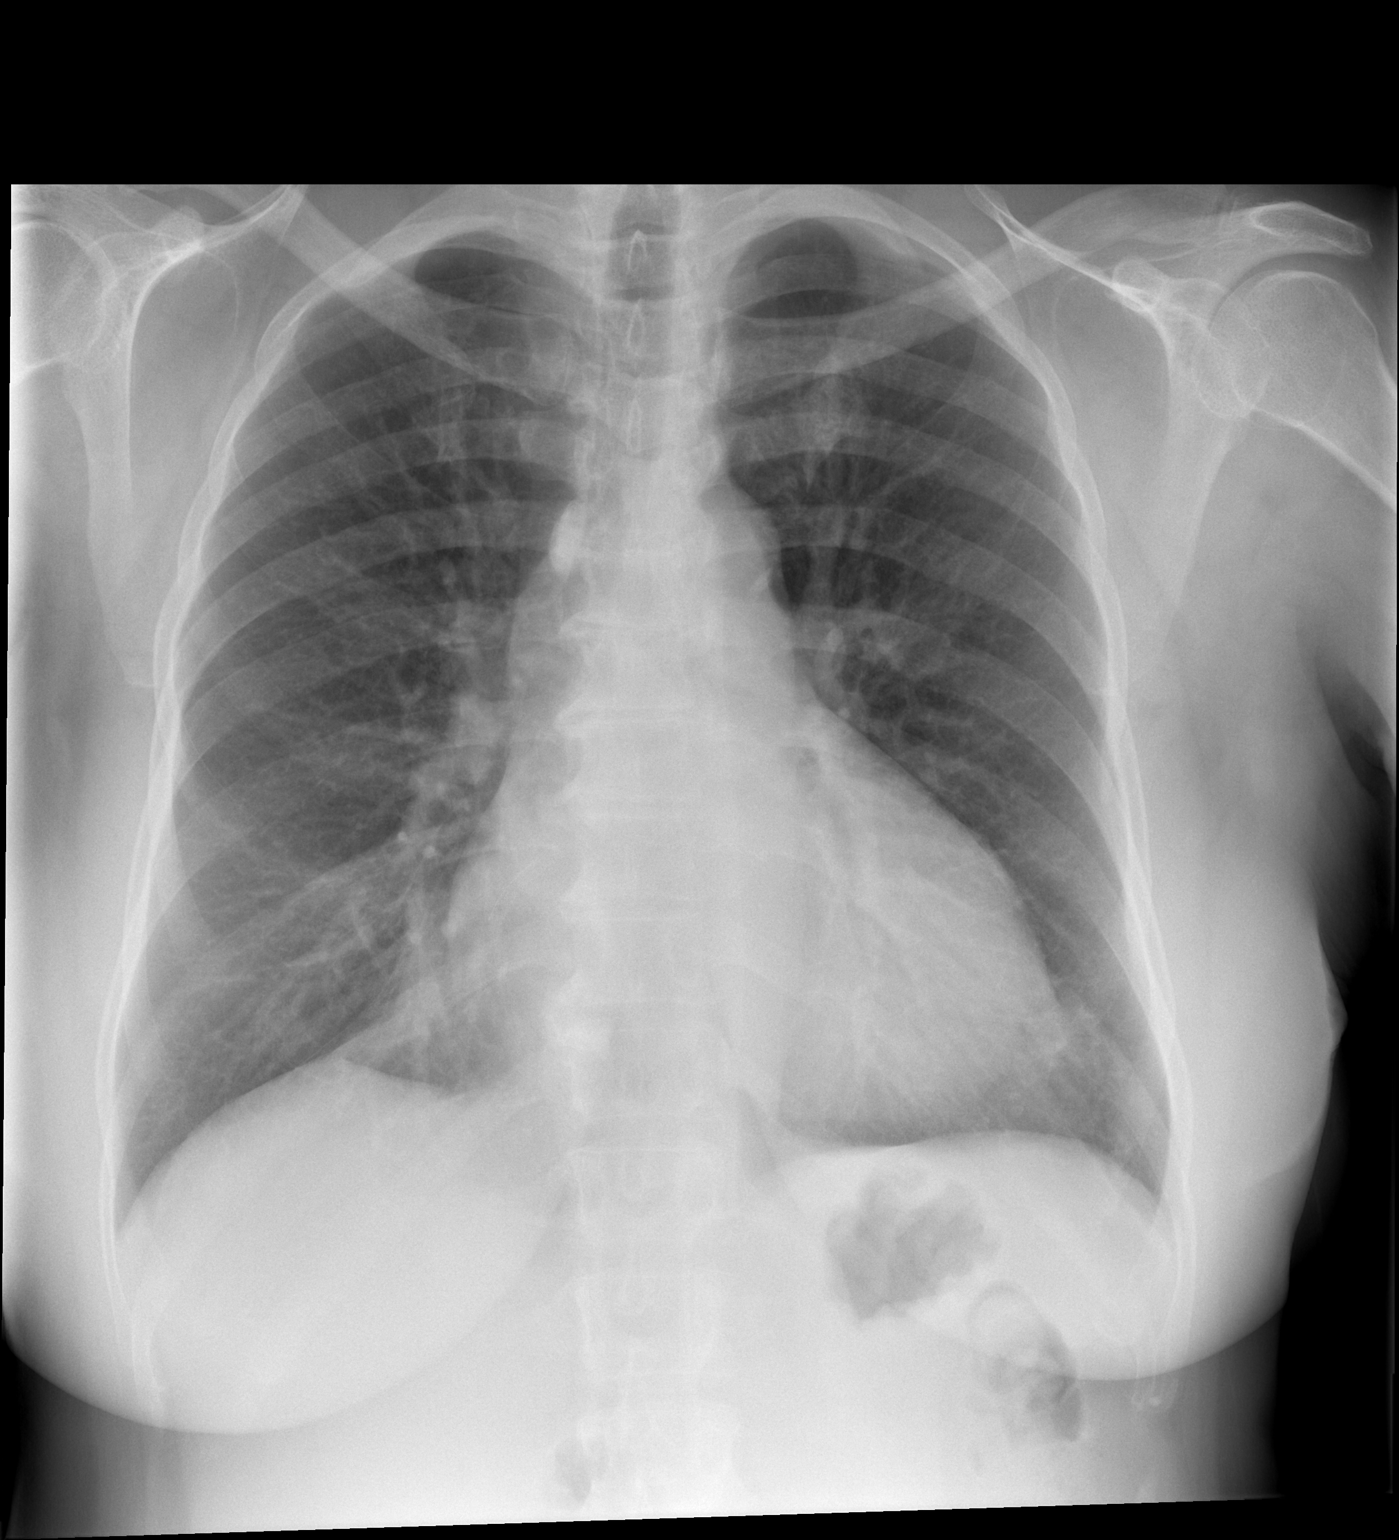

[w chest lat]
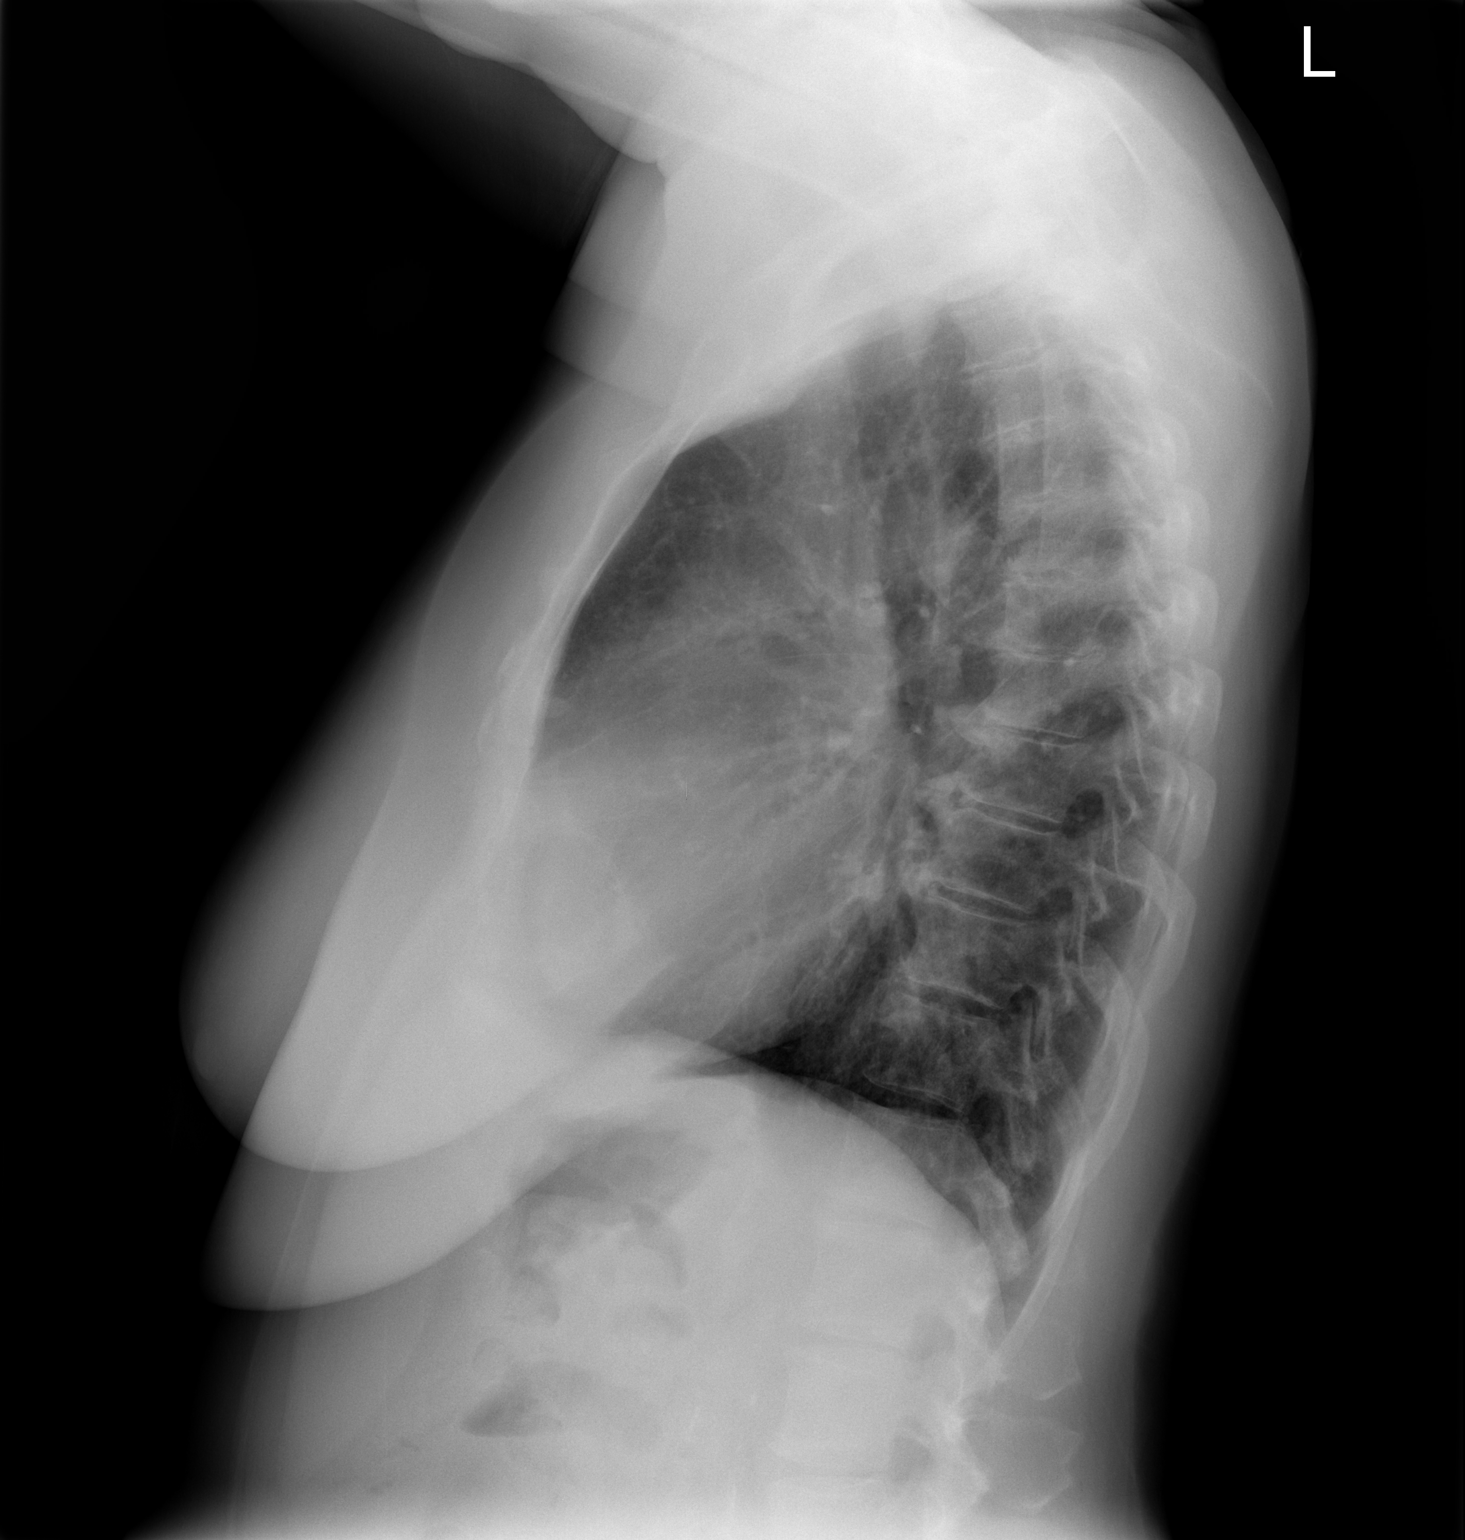

[2 of 2 positions shown; findings below may reference images not displayed]

FINDINGS: Enlargement of cardiac silhouette.
Slight pulmonary vascular congestion.
Mediastinal contours normal.
No pulmonary infiltrate, pleural effusion or pneumothorax.
Scattered end plate spur formation thoracic spine.
IMPRESSION: Enlargement of cardiac silhouette with mild pulmonary vascular
congestion.
No acute abnormalities.

## 2013-06-08 ENCOUNTER — Encounter: Payer: Self-pay | Admitting: Cardiovascular Disease

## 2013-06-08 ENCOUNTER — Ambulatory Visit (INDEPENDENT_AMBULATORY_CARE_PROVIDER_SITE_OTHER): Payer: Medicare HMO | Admitting: Cardiovascular Disease

## 2013-06-08 VITALS — BP 141/70 | HR 109 | Ht 61.0 in | Wt 132.0 lb

## 2013-06-08 DIAGNOSIS — E785 Hyperlipidemia, unspecified: Secondary | ICD-10-CM

## 2013-06-08 DIAGNOSIS — I2510 Atherosclerotic heart disease of native coronary artery without angina pectoris: Secondary | ICD-10-CM

## 2013-06-08 DIAGNOSIS — I739 Peripheral vascular disease, unspecified: Secondary | ICD-10-CM

## 2013-06-08 HISTORY — DX: Peripheral vascular disease, unspecified: I73.9

## 2013-06-08 NOTE — Assessment & Plan Note (Signed)
The patient was referred in by Dr. Trellis Paganini at [facility] for evaluation of PAD and likelihood of healing after a surgical procedure on her foot. She does have abnormal Doppler studies. She saw Dr.Jay Gangi who i her on Pletal. She says the symptoms are worse on the right and again several months ago. She's had Dopplers performed at an outside center that showed a right ABI of 0.6 in the left of 0.72. Because she is a patient of another cardiologist I am going to defer further evaluation and treatment for him and we'll see her back on a when necessary basis

## 2013-06-08 NOTE — Progress Notes (Signed)
06/08/2013 Joanne Morris   Aug 17, 1950  [id number]  Primary Physician Philis Fendt, MD Primary Cardiologist: Lorretta Harp MD Renae Gloss   HPI:  Joanne Morris is a 63 year old moderately overweight married African American female patient of Dr. Trellis Paganini at [facility] center as well as Dr. Gertie Gowda . She was referred for peripheral last evaluation because of claudication and the potential need for podiatry surgery. 2 coronary disease [facility] Her problems include hypertension, hyperlipidemia and diabetes. She smoked for 20 years and stopped 3 years ago. She denies chest pain or shortness of breath. She does have right much greater than the left lower extremity claudication with recent ABIs as suggested moderate to severe PAD and right greater than left lower extremities and she was referred here for further evaluation. Because she is a prior patient of Dr. Serina Cowper I am going to refer her back to him for further evaluation.   Current Outpatient Prescriptions  Medication Sig Dispense Refill  . ALPRAZolam (XANAX) 0.25 MG tablet Take 0.25 mg by mouth daily as needed for anxiety.      Marland Kitchen amLODipine (NORVASC) 10 MG tablet Take 10 mg by mouth daily.      Marland Kitchen aspirin EC 81 MG tablet Take 81 mg by mouth daily.      . cilostazol (PLETAL) 100 MG tablet       . HYDROcodone-acetaminophen (NORCO/VICODIN) 5-325 MG per tablet       . lisinopril-hydrochlorothiazide (PRINZIDE,ZESTORETIC) 20-12.5 MG per tablet Take 1 tablet by mouth 2 (two) times daily.       . meloxicam (MOBIC) 7.5 MG tablet       . metFORMIN (GLUCOPHAGE) 500 MG tablet Take 500 mg by mouth daily with breakfast.       . methocarbamol (ROBAXIN) 500 MG tablet       . niacin (NIASPAN) 1000 MG CR tablet Take 1,000 mg by mouth at bedtime.       . nitroGLYCERIN (NITROSTAT) 0.4 MG SL tablet Place 0.4 mg under the tongue every 5 (five) minutes as needed. For chest pain      .  omeprazole (PRILOSEC) 20 MG capsule Take 20 mg by mouth daily.      . pravastatin (PRAVACHOL) 40 MG tablet Take 40 mg by mouth daily.      . traMADol (ULTRAM) 50 MG tablet Take 50-100 mg by mouth every 8 (eight) hours as needed for pain.       No current facility-administered medications for this visit.    No Known Allergies  History   Social History  . Marital Status: Legally Separated    Spouse Name: N/A    Number of Children: N/A  . Years of Education: N/A   Occupational History  . Not on file.   Social History Main Topics  . Smoking status: Former Smoker    Quit date: 05/30/2009  . Smokeless tobacco: Never Used  . Alcohol Use: No  . Drug Use: No  . Sexual Activity: No   Other Topics Concern  . Not on file   Social History Narrative  . No narrative on file     Review of Systems: General: negative for chills, fever, night sweats or weight changes.  Cardiovascular: negative for chest pain, dyspnea on exertion, edema, orthopnea, palpitations, paroxysmal nocturnal dyspnea or shortness of breath Dermatological: negative for rash Respiratory: negative for cough or wheezing Urologic: negative for hematuria Abdominal: negative for nausea, vomiting, diarrhea, bright  red blood per rectum, melena, or hematemesis Neurologic: negative for visual changes, syncope, or dizziness All other systems reviewed and are otherwise negative except as noted above.    Blood pressure 141/70, pulse 109, height 5\' 1"  (1.549 m), weight 132 lb (59.875 kg).  General appearance: alert and no distress Neck: no adenopathy, no JVD, supple, symmetrical, trachea midline, thyroid not enlarged, symmetric, no tenderness/mass/nodules and soft bilateral carotid bruits Lungs: clear to auscultation bilaterally Heart: regular rate and rhythm, S1, S2 normal, no murmur, click, rub or gallop Extremities: extremities normal, atraumatic, no cyanosis or edema and absent right, 1+ left pedal pulse  EKG I  performed today  ASSESSMENT AND PLAN:   CAD (coronary artery disease) History of stenting of her coronary artery [place] back in 2008. She did not have a heart attack. She denies chest pain or shortness of breath.  Dyslipidemia On statin therapy followed by her PCP  Peripheral arterial disease The patient was referred in by Dr. Trellis Paganini at [facility] for evaluation of PAD and likelihood of healing after a surgical procedure on her foot. She does have abnormal Doppler studies. She saw Dr.Jay Gangi who i her on Pletal. She says the symptoms are worse on the right and again several months ago. She's had Dopplers performed at an outside center that showed a right ABI of 0.6 in the left of 0.72. Because she is a patient of another cardiologist I am going to defer further evaluation and treatment for him and we'll see her back on a when necessary basis      Lorretta Harp MD [facility], [facility] 06/08/2013 4:43 PM

## 2013-06-08 NOTE — Assessment & Plan Note (Signed)
History of stenting of her coronary artery [place] back in 2008. She did not have a heart attack. She denies chest pain or shortness of breath.

## 2013-06-08 NOTE — Assessment & Plan Note (Signed)
On statin therapy followed by her PCP 

## 2013-06-08 NOTE — Patient Instructions (Signed)
Follow up with Dr [name] for the discomfort in your legs and your cardiac care.

## 2013-06-09 ENCOUNTER — Telehealth: Payer: Self-pay | Admitting: Cardiovascular Disease

## 2013-06-09 NOTE — Telephone Encounter (Signed)
Returned call to [place], patient's daughter. Patient was seen by Dr. Gwenlyn Found yesterday and was referred back to Dr. [name] to manage her cardiac care. Daughter was inquiring of echo results, which were not ordered by Dr. Gwenlyn Found and are not in [place] (informed daughter of this). Advised her to contact Dr. Mila Palmer office to inquire of test results.

## 2013-06-09 NOTE — Telephone Encounter (Signed)
Her mother was seen by Dr Gwenlyn Found yesterday. She would like to talk to somebody and find out what her echo results were.

## 2013-06-29 ENCOUNTER — Encounter (HOSPITAL_COMMUNITY): Payer: Self-pay | Admitting: Pharmacy Technician

## 2013-07-06 ENCOUNTER — Ambulatory Visit (HOSPITAL_COMMUNITY)
Admission: RE | Admit: 2013-07-06 | Discharge: 2013-07-06 | Disposition: A | Discharge status: Home / Self Care | Payer: Medicare HMO | Source: Ambulatory Visit | Attending: Cardiology | Admitting: Cardiology

## 2013-07-06 ENCOUNTER — Encounter (HOSPITAL_COMMUNITY)
Admission: RE | Disposition: A | Discharge status: Home / Self Care | Payer: Self-pay | Source: Ambulatory Visit | Attending: Cardiology

## 2013-07-06 DIAGNOSIS — I2510 Atherosclerotic heart disease of native coronary artery without angina pectoris: Secondary | ICD-10-CM | POA: Diagnosis not present

## 2013-07-06 DIAGNOSIS — E119 Type 2 diabetes mellitus without complications: Secondary | ICD-10-CM | POA: Insufficient documentation

## 2013-07-06 DIAGNOSIS — K219 Gastro-esophageal reflux disease without esophagitis: Secondary | ICD-10-CM | POA: Diagnosis not present

## 2013-07-06 DIAGNOSIS — F411 Generalized anxiety disorder: Secondary | ICD-10-CM | POA: Diagnosis not present

## 2013-07-06 DIAGNOSIS — I739 Peripheral vascular disease, unspecified: Secondary | ICD-10-CM | POA: Diagnosis present

## 2013-07-06 DIAGNOSIS — I10 Essential (primary) hypertension: Secondary | ICD-10-CM | POA: Insufficient documentation

## 2013-07-06 DIAGNOSIS — E782 Mixed hyperlipidemia: Secondary | ICD-10-CM | POA: Insufficient documentation

## 2013-07-06 DIAGNOSIS — M129 Arthropathy, unspecified: Secondary | ICD-10-CM | POA: Insufficient documentation

## 2013-07-06 DIAGNOSIS — I70219 Atherosclerosis of native arteries of extremities with intermittent claudication, unspecified extremity: Secondary | ICD-10-CM | POA: Insufficient documentation

## 2013-07-06 HISTORY — PX: LOWER EXTREMITY ANGIOGRAM: SHX5508

## 2013-07-06 LAB — CBC
HCT: 33.6 % — ABNORMAL LOW (ref 36.0–46.0)
HEMOGLOBIN: 10.8 g/dL — AB (ref 12.0–15.0)
MCH: 25.3 pg — AB (ref 26.0–34.0)
MCHC: 32.1 g/dL (ref 30.0–36.0)
MCV: 78.7 fL (ref 78.0–100.0)
Platelets: 351 10*3/uL (ref 150–400)
RBC: 4.27 MIL/uL (ref 3.87–5.11)
RDW: 15.2 % (ref 11.5–15.5)
WBC: 7.8 10*3/uL (ref 4.0–10.5)

## 2013-07-06 LAB — BASIC METABOLIC PANEL
BUN: 16 mg/dL (ref 6–23)
CALCIUM: 10.5 mg/dL (ref 8.4–10.5)
CO2: 26 mEq/L (ref 19–32)
CREATININE: 1.07 mg/dL (ref 0.50–1.10)
Chloride: 102 mEq/L (ref 96–112)
GFR calc Af Amer: 63 mL/min — ABNORMAL LOW (ref >90)
GFR, EST NON AFRICAN AMERICAN: 54 mL/min — AB (ref >90)
GLUCOSE: 154 mg/dL — AB (ref 70–99)
Potassium: 3.8 mEq/L (ref 3.7–5.3)
SODIUM: 143 meq/L (ref 137–147)

## 2013-07-06 LAB — GLUCOSE, CAPILLARY
Glucose-Capillary: 148 mg/dL — ABNORMAL HIGH (ref 70–99)
Glucose-Capillary: 128 mg/dL — ABNORMAL HIGH (ref 70–99)

## 2013-07-06 LAB — PROTIME-INR
INR: 0.85 (ref 0.00–1.49)
Prothrombin Time: 11.5 seconds — ABNORMAL LOW (ref 11.6–15.2)

## 2013-07-06 LAB — APTT: aPTT: 35 seconds (ref 24–37)

## 2013-07-06 SURGERY — ANGIOGRAM, LOWER EXTREMITY
Anesthesia: LOCAL

## 2013-07-06 MED ORDER — LIDOCAINE HCL (PF) 1 % IJ SOLN
INTRAMUSCULAR | Status: AC
  Filled 2013-07-06: qty 30

## 2013-07-06 MED ORDER — HEPARIN (PORCINE) IN NACL 2-0.9 UNIT/ML-% IJ SOLN
INTRAMUSCULAR | Status: AC
  Filled 2013-07-06: qty 1500

## 2013-07-06 MED ORDER — SODIUM CHLORIDE 0.9 % IV BOLUS (SEPSIS)
500.0000 mL | Freq: Once | INTRAVENOUS | Status: AC
  Administered 2013-07-06: 1000 mL via INTRAVENOUS

## 2013-07-06 MED ORDER — SODIUM CHLORIDE 0.9 % IV SOLN: INTRAVENOUS | Status: DC

## 2013-07-06 MED ORDER — HYDROMORPHONE HCL PF 1 MG/ML IJ SOLN
INTRAMUSCULAR | Status: AC
  Filled 2013-07-06: qty 1

## 2013-07-06 MED ORDER — MIDAZOLAM HCL 2 MG/2ML IJ SOLN
INTRAMUSCULAR | Status: AC
  Filled 2013-07-06: qty 2

## 2013-07-06 MED ORDER — METFORMIN HCL 500 MG PO TABS: 500.0000 mg | ORAL_TABLET | Freq: Every day | ORAL | Status: DC

## 2013-07-06 NOTE — CV Procedure (Addendum)
Procedures performed:  1. left Femoral Arterial access 2. Abdominal aortogram.  3. abdominal aortogram with bifemoral runoff  Indication: Claudication,  Abnormal LE arterial duplex. Patient with class III claudication, ABI revealing moderate decrease in perfusion. Patient has a corn in the right foot, medial excision, was eventually referred for evaluation.  Peripheral arthrogram: No evidence of abdominal aneurysm. 2 renal arteries one on either side and they're widely patent.   Aortoiliac bifurcation was widely patent. His abdominal aortic bifurcation.  Femoral arteriogram: The aortoiliac bifurcation is widely patent. Iliac vessels are widely patent. The common femoral artery and superficial femoral artery have mild disease. Below the knee, bilaterally, there is severe small vessel disease. There is one vessel runoff in the form of peroneal artery on the left side and the peroneal artery has mild to moderate diffuse disease. AT and PT are occluded. On the right lower extremity, much more symptomatic leg, there is 0 vessel runoff. The peroneal artery has a subtotal occlusion in the proximal to mid segment, short segment, however the vessel itself is diffusely diseased. At the level of the foot, there is no significant vasculature, the foot is being perfused by collaterals.  Impression: Severe peripheral arterial disease involving the small vessels bilaterally below the knee. Patient has 0 vessel runoff on the right lower extremity, with the peroneal artery showing a subtotal occlusion in the proximal to midsegment, the foot is perfused by collaterals only on the right. There is one vessel runoff in the left lower extremity below the knee in the form of peroneal artery. I will the patient for atherectomy of the right peroneal artery as it is the only vessel that goes down all the way to the foot. This is being performed prophylactically for limb salvage, I suspect without PTA, she probably will have  complete occlusion of vessel and progression of the vessel disease. Interventional data:   TECHNIQUE OF THE  PROCEDURE: Under sterile precautions using a 5-French left femoral arterial access, a 5 [place] Omniflush catheter was advanced  into the desscending aorta and arteriogram was performed. The same catheter was utilized to perform abdominal aortogram with bifemoral runoff. The catheter was then pulled out of the body over the Versacore wire. Hemostasis was obtained by applying manual pressure. A total of 90 cc of contrast was utilized for diagnostic angiography.

## 2013-07-06 NOTE — Interval H&P Note (Signed)
History and Physical Interval Note:  07/06/2013 6:25 AM  Joanne Morris  has presented today for surgery, with the diagnosis of pvd  The various methods of treatment have been discussed with the patient and family. After consideration of risks, benefits and other options for treatment, the patient has consented to  Procedure(s): LOWER EXTREMITY ANGIOGRAM (N/A) and possible PTA as a surgical intervention .  The patient's history has been reviewed, patient examined, no change in status, stable for surgery.  I have reviewed the patient's chart and labs.  Questions were answered to the patient's satisfaction.     Laverda Page

## 2013-07-06 NOTE — H&P (Signed)
  Please see office visit notes for complete details of HPI.  

## 2013-07-06 NOTE — Discharge Instructions (Signed)

## 2013-07-20 ENCOUNTER — Encounter (HOSPITAL_COMMUNITY)
Admission: RE | Disposition: A | Discharge status: Home / Self Care | Payer: Self-pay | Source: Ambulatory Visit | Attending: Cardiology

## 2013-07-20 ENCOUNTER — Encounter (HOSPITAL_COMMUNITY): Payer: Self-pay | Admitting: General Practice

## 2013-07-20 ENCOUNTER — Ambulatory Visit (HOSPITAL_COMMUNITY)
Admission: RE | Admit: 2013-07-20 | Discharge: 2013-07-21 | Disposition: A | Discharge status: Home / Self Care | Payer: Medicare HMO | Source: Ambulatory Visit | Attending: Cardiology | Admitting: Cardiology

## 2013-07-20 DIAGNOSIS — E785 Hyperlipidemia, unspecified: Secondary | ICD-10-CM | POA: Insufficient documentation

## 2013-07-20 DIAGNOSIS — IMO0001 Reserved for inherently not codable concepts without codable children: Secondary | ICD-10-CM | POA: Insufficient documentation

## 2013-07-20 DIAGNOSIS — I10 Essential (primary) hypertension: Secondary | ICD-10-CM | POA: Diagnosis not present

## 2013-07-20 DIAGNOSIS — Z23 Encounter for immunization: Secondary | ICD-10-CM | POA: Diagnosis not present

## 2013-07-20 DIAGNOSIS — E119 Type 2 diabetes mellitus without complications: Secondary | ICD-10-CM

## 2013-07-20 DIAGNOSIS — Z87891 Personal history of nicotine dependence: Secondary | ICD-10-CM | POA: Diagnosis not present

## 2013-07-20 DIAGNOSIS — Z538 Procedure and treatment not carried out for other reasons: Secondary | ICD-10-CM | POA: Insufficient documentation

## 2013-07-20 DIAGNOSIS — I70219 Atherosclerosis of native arteries of extremities with intermittent claudication, unspecified extremity: Secondary | ICD-10-CM | POA: Insufficient documentation

## 2013-07-20 DIAGNOSIS — E1165 Type 2 diabetes mellitus with hyperglycemia: Secondary | ICD-10-CM

## 2013-07-20 DIAGNOSIS — I739 Peripheral vascular disease, unspecified: Secondary | ICD-10-CM | POA: Diagnosis present

## 2013-07-20 HISTORY — DX: Gastro-esophageal reflux disease without esophagitis: K21.9

## 2013-07-20 HISTORY — DX: Pure hypercholesterolemia, unspecified: E78.00

## 2013-07-20 HISTORY — DX: Peripheral vascular disease, unspecified: I73.9

## 2013-07-20 HISTORY — DX: Type 2 diabetes mellitus without complications: E11.9

## 2013-07-20 HISTORY — PX: LOWER EXTREMITY ANGIOGRAM: SHX5955

## 2013-07-20 HISTORY — DX: Anxiety disorder, unspecified: F41.9

## 2013-07-20 HISTORY — PX: LOWER EXTREMITY ANGIOGRAM: SHX5508

## 2013-07-20 LAB — GLUCOSE, CAPILLARY
GLUCOSE-CAPILLARY: 112 mg/dL — AB (ref 70–99)
GLUCOSE-CAPILLARY: 129 mg/dL — AB (ref 70–99)
GLUCOSE-CAPILLARY: 156 mg/dL — AB (ref 70–99)
Glucose-Capillary: 138 mg/dL — ABNORMAL HIGH (ref 70–99)
Glucose-Capillary: 154 mg/dL — ABNORMAL HIGH (ref 70–99)

## 2013-07-20 LAB — POCT ACTIVATED CLOTTING TIME
ACTIVATED CLOTTING TIME: 221 s
Activated Clotting Time: 277 seconds
Activated Clotting Time: 188 seconds
Activated Clotting Time: 155 seconds

## 2013-07-20 SURGERY — ANGIOGRAM, LOWER EXTREMITY
Anesthesia: LOCAL

## 2013-07-20 MED ORDER — FENTANYL CITRATE 0.05 MG/ML IJ SOLN: 50.0000 ug | INTRAMUSCULAR | Status: DC | PRN

## 2013-07-20 MED ORDER — FENTANYL CITRATE 0.05 MG/ML IJ SOLN
INTRAMUSCULAR | Status: AC
  Filled 2013-07-20: qty 2

## 2013-07-20 MED ORDER — AMLODIPINE BESYLATE 10 MG PO TABS
10.0000 mg | ORAL_TABLET | Freq: Every day | ORAL | Status: DC
  Administered 2013-07-21: 10:00:00 10 mg via ORAL
  Filled 2013-07-20: qty 1

## 2013-07-20 MED ORDER — PNEUMOCOCCAL VAC POLYVALENT 25 MCG/0.5ML IJ INJ
0.5000 mL | INJECTION | INTRAMUSCULAR | Status: AC
  Administered 2013-07-21: 10:00:00 0.5 mL via INTRAMUSCULAR
  Filled 2013-07-20: qty 0.5

## 2013-07-20 MED ORDER — SODIUM CHLORIDE 0.9 % IV SOLN: 1.0000 mL/kg/h | INTRAVENOUS | Status: AC

## 2013-07-20 MED ORDER — HEPARIN SODIUM (PORCINE) 1000 UNIT/ML IJ SOLN
INTRAMUSCULAR | Status: AC
  Filled 2013-07-20: qty 1

## 2013-07-20 MED ORDER — LISINOPRIL 20 MG PO TABS
20.0000 mg | ORAL_TABLET | Freq: Two times a day (BID) | ORAL | Status: DC
  Administered 2013-07-20 – 2013-07-21 (×2): 20 mg via ORAL
  Filled 2013-07-20 (×4): qty 1

## 2013-07-20 MED ORDER — ASPIRIN EC 81 MG PO TBEC
81.0000 mg | DELAYED_RELEASE_TABLET | Freq: Every day | ORAL | Status: DC
  Administered 2013-07-20: 19:00:00 81 mg via ORAL
  Filled 2013-07-20 (×2): qty 1

## 2013-07-20 MED ORDER — HYDROCODONE-ACETAMINOPHEN 5-325 MG PO TABS: 1.0000 | ORAL_TABLET | Freq: Four times a day (QID) | ORAL | Status: DC | PRN

## 2013-07-20 MED ORDER — CILOSTAZOL 100 MG PO TABS
100.0000 mg | ORAL_TABLET | Freq: Two times a day (BID) | ORAL | Status: DC
  Administered 2013-07-20 – 2013-07-21 (×2): 100 mg via ORAL
  Filled 2013-07-20 (×3): qty 1

## 2013-07-20 MED ORDER — PANTOPRAZOLE SODIUM 40 MG PO TBEC
40.0000 mg | DELAYED_RELEASE_TABLET | Freq: Every day | ORAL | Status: DC
  Administered 2013-07-21: 10:00:00 40 mg via ORAL
  Filled 2013-07-20: qty 1

## 2013-07-20 MED ORDER — SODIUM CHLORIDE 0.9 % IV SOLN
250.0000 mL | INTRAVENOUS | Status: DC | PRN
Start: 2013-07-20 — End: 2013-07-21

## 2013-07-20 MED ORDER — ZOLPIDEM TARTRATE 5 MG PO TABS
5.0000 mg | ORAL_TABLET | Freq: Every evening | ORAL | Status: DC | PRN
  Administered 2013-07-20: 22:00:00 5 mg via ORAL
  Filled 2013-07-20: qty 1

## 2013-07-20 MED ORDER — ATORVASTATIN CALCIUM 20 MG PO TABS
20.0000 mg | ORAL_TABLET | Freq: Every day | ORAL | Status: DC
  Filled 2013-07-20 (×2): qty 1

## 2013-07-20 MED ORDER — SODIUM CHLORIDE 0.9 % IJ SOLN: 3.0000 mL | INTRAMUSCULAR | Status: DC | PRN

## 2013-07-20 MED ORDER — NITROGLYCERIN 0.4 MG SL SUBL: 0.4000 mg | SUBLINGUAL_TABLET | SUBLINGUAL | Status: DC | PRN

## 2013-07-20 MED ORDER — SODIUM CHLORIDE 0.9 % IV BOLUS (SEPSIS)
500.0000 mL | Freq: Once | INTRAVENOUS | Status: AC
  Administered 2013-07-20: 08:00:00 via INTRAVENOUS

## 2013-07-20 MED ORDER — LIDOCAINE HCL (PF) 1 % IJ SOLN
INTRAMUSCULAR | Status: AC
  Filled 2013-07-20: qty 30

## 2013-07-20 MED ORDER — METHOCARBAMOL 500 MG PO TABS
500.0000 mg | ORAL_TABLET | Freq: Four times a day (QID) | ORAL | Status: DC | PRN
Start: 2013-07-20 — End: 2013-07-21

## 2013-07-20 MED ORDER — SIMVASTATIN 40 MG PO TABS
40.0000 mg | ORAL_TABLET | Freq: Every day | ORAL | Status: DC
  Filled 2013-07-20: qty 1

## 2013-07-20 MED ORDER — INSULIN ASPART 100 UNIT/ML ~~LOC~~ SOLN
0.0000 [IU] | Freq: Three times a day (TID) | SUBCUTANEOUS | Status: DC
  Administered 2013-07-21: 09:00:00 2 [IU] via SUBCUTANEOUS

## 2013-07-20 MED ORDER — ALPRAZOLAM 0.25 MG PO TABS: 0.2500 mg | ORAL_TABLET | Freq: Every day | ORAL | Status: DC | PRN

## 2013-07-20 MED ORDER — HYDROCHLOROTHIAZIDE 12.5 MG PO CAPS
12.5000 mg | ORAL_CAPSULE | Freq: Two times a day (BID) | ORAL | Status: DC
  Administered 2013-07-20 – 2013-07-21 (×2): 12.5 mg via ORAL
  Filled 2013-07-20 (×4): qty 1

## 2013-07-20 MED ORDER — MIDAZOLAM HCL 2 MG/2ML IJ SOLN
INTRAMUSCULAR | Status: AC
  Filled 2013-07-20: qty 2

## 2013-07-20 MED ORDER — SODIUM CHLORIDE 0.9 % IJ SOLN
3.0000 mL | Freq: Two times a day (BID) | INTRAMUSCULAR | Status: DC
  Administered 2013-07-20: 3 mL via INTRAVENOUS

## 2013-07-20 MED ORDER — SODIUM CHLORIDE 0.9 % IV SOLN: INTRAVENOUS | Status: DC

## 2013-07-20 MED ORDER — LISINOPRIL-HYDROCHLOROTHIAZIDE 20-12.5 MG PO TABS: 1.0000 | ORAL_TABLET | Freq: Two times a day (BID) | ORAL | Status: DC

## 2013-07-20 MED ORDER — NITROGLYCERIN IN D5W 200-5 MCG/ML-% IV SOLN
INTRAVENOUS | Status: AC
  Filled 2013-07-20: qty 250

## 2013-07-20 MED ORDER — HEPARIN (PORCINE) IN NACL 2-0.9 UNIT/ML-% IJ SOLN
INTRAMUSCULAR | Status: AC
  Filled 2013-07-20: qty 1000

## 2013-07-20 MED ORDER — VERAPAMIL HCL 2.5 MG/ML IV SOLN
INTRAVENOUS | Status: AC
  Filled 2013-07-20: qty 2

## 2013-07-20 NOTE — Interval H&P Note (Signed)
History and Physical Interval Note:  07/20/2013 10:40 AM  Joanne Morris  has presented today for surgery, with the diagnosis of claudication  The various methods of treatment have been discussed with the patient and family. After consideration of risks, benefits and other options for treatment, the patient has consented to  Procedure(s): LOWER EXTREMITY ANGIOGRAM (N/A) and angioplasty as a surgical intervention .  The patient's history has been reviewed, patient examined, no change in status, stable for surgery.  I have reviewed the patient's chart and labs.  Questions were answered to the patient's satisfaction.   Patient consents to having students observing: Raoul Pitch.   Laverda Page

## 2013-07-20 NOTE — H&P (View-Only) (Signed)
  Please see office visit notes for complete details of HPI.  

## 2013-07-20 NOTE — Care Management Note (Addendum)
  Page 1 of 1   07/20/2013     5:08:48 PM CARE MANAGEMENT NOTE 07/20/2013  Patient:  Joanne Morris, Joanne Morris   Account Number:  [id number]  Date Initiated:  07/20/2013  Documentation initiated by:  Mariann Laster  Subjective/Objective Assessment:   PAD     Action/Plan:   CM to follow for disposition needs   Anticipated DC Date:  07/21/2013   Anticipated DC Plan:  HOME/SELF CARE         Choice offered to / List presented to:             Status of service:  Completed, signed off Medicare Important Message given?   (If response is "NO", the following Medicare IM given date fields will be blank) Date Medicare IM given:   Date Additional Medicare IM given:    Discharge Disposition:    Per UR Regulation:    If discussed at Long Length of Stay Meetings, dates discussed:    Comments:  Mariann Laster RN, BSN, [place], [place] [phone] OPIB order at 07/20/13 12:05 PM Med Review:  no new needs identified at this time.

## 2013-07-20 NOTE — CV Procedure (Addendum)
Procedure performed: Left femoral arterial access, cross over from the left femoral artery into the right superficial femoral artery and placement of the catheter tip in the right superficial femoral artery. Right femoral arteriogram distal runoff. Attempted angioplasty to the peroneal artery on the right.  Indication: Patient is a 63 year old female who has severe lifestyle limiting claudication. 2 weeks ago she underwent lower extremity arteriogram which had revealed one-vessel runoff in both the lower extremities, with the peroneal artery being the dominant vessel bilaterally. Right peroneal artery had subtotal/chronic total occlusion which is very focal. As patient has severe pain in her legs, also for limb preservation as it was only vessel is supplying the foot, the lesion was felt to be very much amenable for angioplasty. Hence patient was brought to the peripheral angiography suite with an eye towards revascularization.  Angiographic data: Right popliteal artery shows mild diffuse disease and mild calcification. The anterior and posterior tibial artery are occluded. Multiple collaterals are evident at the level of the foot. The peroneal artery is the dominant vessel that is supplying the foot at the level of the ankle. The peroneal artery in the proximal segment has chronic total occlusion, on previous angiograms appear to be subtotally occluded.  Interventional data: Unsuccessful attempt at crossing the CTO. Due to dissection, further attempts at crossing the CTO was not made, there was no compromise in the collateral flow to the peroneal artery. Hence the lesion was left alone. Certainly we can try to reattempt this at a later date if she continues to have significant claudication of her foot.  Technique: Using a 7 French left femoral arterial access, a short sheath was introduced and using Versacore wire along with cross over 5 French catheter I was able to cross from the left femoral artery into  the right femoral artery, a glide wire had to be utilized to cross over. I then exchanged 7 [place] short sheath to a 55 cm Ansel sheath which was placed in the right superficial femoral artery. I then utilized approach program CTO wire over a CXI, 0.018 straight catheter, I attempted to cross the CTO. The wire would not cross, hence exchange wire to a Regalia glide tip 0.014" x 300 cm wire. At this point I realized the tip of the wire was clearly outside of the vessel. During the procedure angiography was repeated and using nitroglycerin multiple occasions, decided to leave the lesion alone, the wires were withdrawn, angiography repeated to confirm the distal filling of the peroneal artery. The long sheath was then exchanged for a short 7 French sheath and sutured in place. Heparin was utilized for maintaining anticoagulation at greater than 250 seconds. Patient remained stable through the procedure. There was no immediate competitions. Post porcedure, patient remained asymptomatic.

## 2013-07-21 DIAGNOSIS — I70219 Atherosclerosis of native arteries of extremities with intermittent claudication, unspecified extremity: Secondary | ICD-10-CM | POA: Diagnosis not present

## 2013-07-21 LAB — BASIC METABOLIC PANEL
BUN: 17 mg/dL (ref 6–23)
CO2: 25 mEq/L (ref 19–32)
Calcium: 9.8 mg/dL (ref 8.4–10.5)
Chloride: 99 mEq/L (ref 96–112)
Creatinine, Ser: 0.97 mg/dL (ref 0.50–1.10)
GFR calc non Af Amer: 61 mL/min — ABNORMAL LOW (ref >90)
GFR, EST AFRICAN AMERICAN: 71 mL/min — AB (ref >90)
Glucose, Bld: 125 mg/dL — ABNORMAL HIGH (ref 70–99)
POTASSIUM: 3.9 meq/L (ref 3.7–5.3)
Sodium: 141 mEq/L (ref 137–147)

## 2013-07-21 LAB — GLUCOSE, CAPILLARY: Glucose-Capillary: 167 mg/dL — ABNORMAL HIGH (ref 70–99)

## 2013-07-21 MED ORDER — LIVING WELL WITH DIABETES BOOK
Freq: Once | Status: AC
  Administered 2013-07-21: 10:00:00
  Filled 2013-07-21: qty 1

## 2013-07-21 MED ORDER — TRAMADOL HCL 50 MG PO TABS: 50.0000 mg | ORAL_TABLET | Freq: Four times a day (QID) | ORAL | Status: DC | PRN

## 2013-07-21 NOTE — Plan of Care (Signed)
Problem: Food- and Nutrition-Related Knowledge Deficit (NB-1.1) Goal: Nutrition education Formal process to instruct or train a patient/client in a skill or to impart knowledge to help patients/clients voluntarily manage or modify food choices and eating behavior to maintain or improve health. Outcome: Completed/Met Date Met:  07/21/13  RD consulted for nutrition education regarding diabetes.   Per pt she has been a diabetic for years but has never had any education. Pt seems to be interested this admission. Pt ready for d/c. Appreciative of information.     Lab Results  Component Value Date    HGBA1C 6.5* 10/11/2012    RD provided "Carbohydrate Counting for People with Diabetes" handout from the Academy of Nutrition and Dietetics. Discussed different food groups and their effects on blood sugar, emphasizing carbohydrate-containing foods. Provided list of carbohydrates and recommended serving sizes of common foods.  Discussed importance of controlled and consistent carbohydrate intake throughout the day. Provided examples of ways to balance meals/snacks and encouraged intake of high-fiber, whole grain complex carbohydrates. Teach back method used.  Expect fair compliance.  Body mass index is 24.96 kg/(m^2). BMI WNL.  Current diet order is CHO Modified, patient is consuming approximately 100% of meals at this time. Labs and medications reviewed. No further nutrition interventions warranted at this time. RD contact information provided. If additional nutrition issues arise, please re-consult RD.  [place], [place], [place] Pager [phone] After Hours Pager

## 2013-07-21 NOTE — Progress Notes (Signed)
Inpatient Diabetes Program Recommendations  AACE/ADA: New Consensus Statement on Inpatient Glycemic Control (2013)  Target Ranges:  Prepandial:   less than 140 mg/dL      Peak postprandial:   less than 180 mg/dL (1-2 hours)      Critically ill patients:  140 - 180 mg/dL   Received page from Mickel Baas, [place], regarding diabetes education. Pt on metformin 500 mg QAM at home. RN states pt drinks regular sodas and high-sugar foods at home. No updated HgbA1C. Last one 10/11/12 was 6.5%. Interested in attending OP Diabetes Education at [facility]. RN requested Diabetes Coordinator to speak with pt regarding importance of glycemic control at home. Will order Living Well With Diabetes book and placed order for OP Diabetes Education.  Results for Joanne Morris, Joanne Morris (MRN [id number]) as of 07/21/2013 10:03  Ref. Range 07/20/2013 12:04 07/20/2013 15:09 07/20/2013 17:43 07/20/2013 21:32 07/21/2013 08:32  Glucose-Capillary Latest Range: 70-99 mg/dL 154 (H) 112 (H) 129 (H) 156 (H) 167 (H)   Good control while inpatient. Thank you. Lorenda Peck, RD, LDN, CDE Inpatient Diabetes Coordinator [phone]

## 2013-07-21 NOTE — Progress Notes (Signed)
Inpatient Diabetes Program Recommendations  AACE/ADA: New Consensus Statement on Inpatient Glycemic Control (2013)  Target Ranges:  Prepandial:   less than 140 mg/dL      Peak postprandial:   less than 180 mg/dL (1-2 hours)      Critically ill patients:  140 - 180 mg/dL   Spoke briefly with patient regarding diabetes management.  She states that she see's Dr. Warren Danes and takes metformin. She was appreciative of dieticians visit.  Briefly reviewed "Know your numbers" handout regarding diabetes standards of care including BP, A1C, and cholesterol management.  Referral was placed also for outpatient diabetes education.  Explained to patient that they would call her to set-up appointment.  She also is interested in getting a glucose meter to check CBG's.  Explained normal CBG ranges and goals.  Thanks, Adah Perl, RN, BC-ADM Inpatient Diabetes Coordinator Pager [phone]

## 2013-07-21 NOTE — Discharge Summary (Signed)
Physician Discharge Summary  Patient ID: Joanne Morris MRN: [id number] DOB/AGE: 1950-05-22 63 y.o.  Admit date: 07/20/2013 Discharge date: 07/21/2013  Primary Discharge Diagnosis PAD with claudication right leg worse than left Secondary Discharge Diagnosis DM-3 uncontrolled Hypertension Hyperlipidemia Prior tobacco use  Significant Diagnostic Studies: peripheral arteriogram on 07/20/2013 and attempted angioplasty to the peroneal artery to the right leg, unsuccessful attempt, due to dissection. Patient has one vessel runoff below knee both legs.   Hospital Course: patient was brought for elective peripheral angiography and possible angioplasty.  During prior angiography 2 weeks ago, she was found to have one vessel runoff below both the lower extremities, right peroneal artery essentially supplying the foot, had a CTO versus subtotal occlusion in the proximal segment.  It was felt to be very amenable for percutaneous revascularization.  For limb preservation, symptoms of claudication, she was brought for elective angioplasty.  Due to unsuccessful attempt, patient will need the dissection to be healed before further attempt at revascularization.  I'll see the patient in the outpatient basis first, prior to proceeding with repeat angioplasty if patient is willing.  Risks of diabetes mellitus and small vessel disease discussed with the patient.   Discharge Exam: Blood pressure 142/64, pulse 89, temperature 97.7 F (36.5 C), temperature source Oral, resp. rate 20, height 5\' 1"  (1.549 m), weight 59.9 kg (132 lb 0.9 oz), SpO2 96.00%.   HEENT: normal limits. No JVD. A, Ox 3  CARDIAC EXAM: S1, S2 normal, no gallop present. No murmur.   CHEST EXAM: No tenderness of chest wall. LUNGS: Clear to percuss and auscultate.  ABDOMEN: No hepatosplenomegaly. BS normal in all 4 quadrants. Abdomen is non-tender.   EXTREMITY: Full range of movementes, No edema. MUSCULOSKELETAL EXAM: Intact with full  range of motion in all 4 extremities.   NEUROLOGIC EXAM: Grossly intact without any focal deficits. Alert O x 3.   VASCULAR EXAM: No skin breakdown. Carotids bilateral soft bruit present. Extremities: Femoral pulse normal. No bruit. Left more prominent than right. Popliteal pulse feeble left and absent right; Pedal pulse absent bilaterally. No prominent pulse felt in the abdomen. No varicose veins. Left femoral artery access site has healed well and no hematoma. Bilateral legs warm and non tender.   Labs:   Lab Results  Component Value Date   WBC 7.8 07/06/2013   HGB 10.8* 07/06/2013   HCT 33.6* 07/06/2013   MCV 78.7 07/06/2013   PLT 351 07/06/2013    Recent Labs Lab 07/21/13 0423  NA 141  K 3.9  CL 99  CO2 25  BUN 17  CREATININE 0.97  CALCIUM 9.8  GLUCOSE 125*   Lab Results  Component Value Date   CKTOTAL 89 06/01/2011   CKMB 1.3 06/01/2011   TROPONINI <0.30 10/12/2012    Lipid Panel     Component Value Date/Time   CHOL 265* 10/11/2012 1943   TRIG 437* 10/11/2012 1943   HDL 52 10/11/2012 1943   CHOLHDL 5.1 10/11/2012 1943   VLDL UNABLE TO CALCULATE IF TRIGLYCERIDE OVER 400 mg/dL 10/11/2012 1943   LDLCALC UNABLE TO CALCULATE IF TRIGLYCERIDE OVER 400 mg/dL 10/11/2012 1943   FOLLOW UP PLANS AND APPOINTMENTS Discharge Instructions   Ambulatory referral to Nutrition and Diabetic Education    Complete by:  As directed             Medication List         ALPRAZolam 0.25 MG tablet  Commonly known as:  XANAX  Take 0.25 mg by mouth daily  as needed for anxiety.     amLODipine 10 MG tablet  Commonly known as:  NORVASC  Take 10 mg by mouth daily.     aspirin EC 81 MG tablet  Take 81 mg by mouth daily.     cilostazol 100 MG tablet  Commonly known as:  PLETAL  Take 100 mg by mouth 2 (two) times daily.     HYDROcodone-acetaminophen 5-325 MG per tablet  Commonly known as:  NORCO/VICODIN  Take 1 tablet by mouth every 6 (six) hours as needed for moderate pain.      lisinopril-hydrochlorothiazide 20-12.5 MG per tablet  Commonly known as:  PRINZIDE,ZESTORETIC  Take 1 tablet by mouth 2 (two) times daily.     meloxicam 7.5 MG tablet  Commonly known as:  MOBIC  Take 7.5 mg by mouth daily as needed for pain.     metFORMIN 500 MG tablet  Commonly known as:  GLUCOPHAGE  Take 1 tablet (500 mg total) by mouth daily with breakfast.     methocarbamol 500 MG tablet  Commonly known as:  ROBAXIN  Take 500 mg by mouth every 6 (six) hours as needed for muscle spasms.     nitroGLYCERIN 0.4 MG SL tablet  Commonly known as:  NITROSTAT  Place 0.4 mg under the tongue every 5 (five) minutes as needed. For chest pain     omeprazole 20 MG capsule  Commonly known as:  PRILOSEC  Take 20 mg by mouth daily.     pravastatin 40 MG tablet  Commonly known as:  PRAVACHOL  Take 40 mg by mouth daily.     traMADol 50 MG tablet  Commonly known as:  ULTRAM  Take 1 tablet (50 mg total) by mouth every 6 (six) hours as needed for moderate pain.          Laverda Page, MD 07/21/2013, 8:17 PM  Pager: [phone] Office: [phone] If no answer: [phone]

## 2013-09-27 ENCOUNTER — Encounter (HOSPITAL_COMMUNITY): Payer: Self-pay | Admitting: Pharmacy Technician

## 2013-09-29 ENCOUNTER — Encounter: Payer: Self-pay | Admitting: Vascular Surgery

## 2013-09-30 ENCOUNTER — Encounter: Payer: Medicare HMO | Admitting: Vascular Surgery

## 2013-10-05 ENCOUNTER — Ambulatory Visit (HOSPITAL_COMMUNITY)
Admission: RE | Admit: 2013-10-05 | Discharge: 2013-10-06 | Disposition: A | Discharge status: Home / Self Care | Payer: Medicare HMO | Source: Ambulatory Visit | Attending: Cardiology | Admitting: Cardiology

## 2013-10-05 ENCOUNTER — Encounter (HOSPITAL_COMMUNITY)
Admission: RE | Disposition: A | Discharge status: Home / Self Care | Payer: Self-pay | Source: Ambulatory Visit | Attending: Cardiology

## 2013-10-05 ENCOUNTER — Encounter (HOSPITAL_COMMUNITY): Payer: Self-pay | Admitting: General Practice

## 2013-10-05 DIAGNOSIS — I10 Essential (primary) hypertension: Secondary | ICD-10-CM | POA: Diagnosis not present

## 2013-10-05 DIAGNOSIS — I70209 Unspecified atherosclerosis of native arteries of extremities, unspecified extremity: Secondary | ICD-10-CM | POA: Diagnosis present

## 2013-10-05 DIAGNOSIS — I7092 Chronic total occlusion of artery of the extremities: Secondary | ICD-10-CM | POA: Diagnosis not present

## 2013-10-05 DIAGNOSIS — E119 Type 2 diabetes mellitus without complications: Secondary | ICD-10-CM | POA: Insufficient documentation

## 2013-10-05 DIAGNOSIS — I70219 Atherosclerosis of native arteries of extremities with intermittent claudication, unspecified extremity: Secondary | ICD-10-CM | POA: Insufficient documentation

## 2013-10-05 DIAGNOSIS — Z87891 Personal history of nicotine dependence: Secondary | ICD-10-CM | POA: Insufficient documentation

## 2013-10-05 DIAGNOSIS — E785 Hyperlipidemia, unspecified: Secondary | ICD-10-CM | POA: Insufficient documentation

## 2013-10-05 DIAGNOSIS — I739 Peripheral vascular disease, unspecified: Secondary | ICD-10-CM | POA: Diagnosis present

## 2013-10-05 HISTORY — PX: BALLOON ANGIOPLASTY, ARTERY: SHX564

## 2013-10-05 HISTORY — PX: ATHERECTOMY: SHX47

## 2013-10-05 HISTORY — PX: LOWER EXTREMITY ANGIOGRAM: SHX5508

## 2013-10-05 LAB — GLUCOSE, CAPILLARY
Glucose-Capillary: 112 mg/dL — ABNORMAL HIGH (ref 70–99)
Glucose-Capillary: 137 mg/dL — ABNORMAL HIGH (ref 70–99)

## 2013-10-05 LAB — POCT ACTIVATED CLOTTING TIME
Activated Clotting Time: 242 seconds
Activated Clotting Time: 259 seconds
Activated Clotting Time: 151 seconds

## 2013-10-05 SURGERY — ANGIOGRAM, LOWER EXTREMITY
Anesthesia: LOCAL | Laterality: Right

## 2013-10-05 MED ORDER — TRAMADOL HCL 50 MG PO TABS: 50.0000 mg | ORAL_TABLET | Freq: Four times a day (QID) | ORAL | Status: DC | PRN

## 2013-10-05 MED ORDER — OXYCODONE-ACETAMINOPHEN 5-325 MG PO TABS
1.0000 | ORAL_TABLET | ORAL | Status: DC | PRN
  Administered 2013-10-05: 13:00:00 2 via ORAL
  Filled 2013-10-05: qty 2

## 2013-10-05 MED ORDER — SODIUM CHLORIDE 0.9 % IV SOLN
1.0000 mL/kg/h | INTRAVENOUS | Status: AC
  Administered 2013-10-05: 1 mL/kg/h via INTRAVENOUS

## 2013-10-05 MED ORDER — LISINOPRIL 20 MG PO TABS
20.0000 mg | ORAL_TABLET | Freq: Every day | ORAL | Status: DC
  Filled 2013-10-05: qty 1

## 2013-10-05 MED ORDER — NITROGLYCERIN 0.4 MG SL SUBL: 0.4000 mg | SUBLINGUAL_TABLET | SUBLINGUAL | Status: DC | PRN

## 2013-10-05 MED ORDER — PRAVASTATIN SODIUM 40 MG PO TABS
40.0000 mg | ORAL_TABLET | Freq: Every day | ORAL | Status: DC
  Administered 2013-10-05: 40 mg via ORAL
  Filled 2013-10-05 (×2): qty 1

## 2013-10-05 MED ORDER — ONDANSETRON HCL 4 MG/2ML IJ SOLN: 4.0000 mg | Freq: Four times a day (QID) | INTRAMUSCULAR | Status: DC | PRN

## 2013-10-05 MED ORDER — PANTOPRAZOLE SODIUM 40 MG PO TBEC
40.0000 mg | DELAYED_RELEASE_TABLET | Freq: Every day | ORAL | Status: DC
  Administered 2013-10-06: 40 mg via ORAL
  Filled 2013-10-05: qty 1

## 2013-10-05 MED ORDER — SODIUM CHLORIDE 0.9 % IV SOLN: 250.0000 mL | INTRAVENOUS | Status: DC | PRN

## 2013-10-05 MED ORDER — CILOSTAZOL 100 MG PO TABS
100.0000 mg | ORAL_TABLET | Freq: Two times a day (BID) | ORAL | Status: DC
  Administered 2013-10-05: 100 mg via ORAL
  Filled 2013-10-05 (×3): qty 1

## 2013-10-05 MED ORDER — SIMVASTATIN 40 MG PO TABS: 40.0000 mg | ORAL_TABLET | Freq: Every day | ORAL | Status: DC

## 2013-10-05 MED ORDER — AMLODIPINE BESYLATE 10 MG PO TABS
10.0000 mg | ORAL_TABLET | Freq: Every day | ORAL | Status: DC
  Filled 2013-10-05: qty 1

## 2013-10-05 MED ORDER — HEPARIN (PORCINE) IN NACL 2-0.9 UNIT/ML-% IJ SOLN
INTRAMUSCULAR | Status: AC
  Filled 2013-10-05: qty 1000

## 2013-10-05 MED ORDER — VERAPAMIL HCL 2.5 MG/ML IV SOLN
INTRAVENOUS | Status: AC
  Filled 2013-10-05: qty 2

## 2013-10-05 MED ORDER — ZOLPIDEM TARTRATE 5 MG PO TABS
5.0000 mg | ORAL_TABLET | Freq: Every evening | ORAL | Status: DC | PRN
  Administered 2013-10-05: 22:00:00 5 mg via ORAL
  Filled 2013-10-05: qty 1

## 2013-10-05 MED ORDER — MIDAZOLAM HCL 2 MG/2ML IJ SOLN
INTRAMUSCULAR | Status: AC
  Filled 2013-10-05: qty 2

## 2013-10-05 MED ORDER — SODIUM CHLORIDE 0.9 % IJ SOLN: 3.0000 mL | INTRAMUSCULAR | Status: DC | PRN

## 2013-10-05 MED ORDER — HYDROCHLOROTHIAZIDE 12.5 MG PO CAPS
12.5000 mg | ORAL_CAPSULE | Freq: Every day | ORAL | Status: DC
  Filled 2013-10-05: qty 1

## 2013-10-05 MED ORDER — HEPARIN SODIUM (PORCINE) 1000 UNIT/ML IJ SOLN
INTRAMUSCULAR | Status: AC
  Filled 2013-10-05: qty 1

## 2013-10-05 MED ORDER — ACETAMINOPHEN 325 MG PO TABS: 650.0000 mg | ORAL_TABLET | ORAL | Status: DC | PRN

## 2013-10-05 MED ORDER — NITROGLYCERIN IN D5W 200-5 MCG/ML-% IV SOLN
INTRAVENOUS | Status: AC
  Filled 2013-10-05: qty 250

## 2013-10-05 MED ORDER — LISINOPRIL-HYDROCHLOROTHIAZIDE 20-12.5 MG PO TABS: 1.0000 | ORAL_TABLET | Freq: Two times a day (BID) | ORAL | Status: DC

## 2013-10-05 MED ORDER — ALPRAZOLAM 0.25 MG PO TABS: 0.2500 mg | ORAL_TABLET | Freq: Every day | ORAL | Status: DC | PRN

## 2013-10-05 MED ORDER — HYDROCODONE-ACETAMINOPHEN 5-325 MG PO TABS
1.0000 | ORAL_TABLET | Freq: Four times a day (QID) | ORAL | Status: DC | PRN
  Administered 2013-10-05: 21:00:00 1 via ORAL
  Filled 2013-10-05: qty 1

## 2013-10-05 MED ORDER — SODIUM CHLORIDE 0.9 % IJ SOLN
3.0000 mL | Freq: Two times a day (BID) | INTRAMUSCULAR | Status: DC
  Administered 2013-10-05: 21:00:00 3 mL via INTRAVENOUS

## 2013-10-05 MED ORDER — METHOCARBAMOL 500 MG PO TABS: 500.0000 mg | ORAL_TABLET | Freq: Four times a day (QID) | ORAL | Status: DC | PRN

## 2013-10-05 MED ORDER — SODIUM CHLORIDE 0.9 % IV SOLN
INTRAVENOUS | Status: DC
Start: 2013-10-05 — End: 2013-10-05
  Administered 2013-10-05: 1000 mL via INTRAVENOUS

## 2013-10-05 MED ORDER — HYDROMORPHONE HCL PF 1 MG/ML IJ SOLN
INTRAMUSCULAR | Status: AC
  Filled 2013-10-05: qty 1

## 2013-10-05 MED ORDER — ASPIRIN EC 81 MG PO TBEC
81.0000 mg | DELAYED_RELEASE_TABLET | Freq: Every day | ORAL | Status: DC
  Administered 2013-10-05: 14:00:00 81 mg via ORAL
  Filled 2013-10-05 (×2): qty 1

## 2013-10-05 MED ORDER — LIDOCAINE HCL (PF) 1 % IJ SOLN
INTRAMUSCULAR | Status: AC
  Filled 2013-10-05: qty 30

## 2013-10-05 NOTE — H&P (Signed)
  Please see office visit notes for complete details of HPI.  

## 2013-10-05 NOTE — Care Management Note (Addendum)
  Page 1 of 1   10/05/2013     11:19:22 AM CARE MANAGEMENT NOTE 10/05/2013  Patient:  Joanne Morris, Joanne Morris   Account Number:  [id number]  Date Initiated:  10/05/2013  Documentation initiated by:  Mariann Laster  Subjective/Objective Assessment:   PAD     Action/Plan:   CM to follow for disposition needs   Anticipated DC Date:  10/06/2013   Anticipated DC Plan:  HOME/SELF CARE         Choice offered to / List presented to:             Status of service:  Completed, signed off Medicare Important Message given?   (If response is "NO", the following Medicare IM given date fields will be blank) Date Medicare IM given:   Medicare IM given by:   Date Additional Medicare IM given:   Additional Medicare IM given by:    Discharge Disposition:  HOME/SELF CARE  Per UR Regulation:    If discussed at Long Length of Stay Meetings, dates discussed:    Comments:  Ethaniel Garfield RN, BSN, MSHL, CCM  Nurse - Case Manager, (Unit [phone]  10/05/2013 Med Review:  no needs identified Dispo Plan:  HOme / Self care.

## 2013-10-05 NOTE — CV Procedure (Signed)
Procedure performed: Left femoral arterial access, cross over from the left into the right common femoral artery, placement of catheter tip in the right common femoral artery and right superficial femoral artery, right femoral arteriogram with distal runoff.  PTA and atherectomy of the right tibioperoneal trunk and chronic total occlusion of peroneal artery with a CSI Stealth solid crown 1.25 mm Crown followed by balloon angioplasty a 3.0 x 80 mm coyote balloon. Stenosis reduced from 100% to 0% with brisk flow.  Indication: Patient is a 63 year old African American female with known peripheral arterial disease. She had previously undergone peripheral angiography and attempted angioplasty at the right peroneal CTO. She has one vessel runoff on the left leg in the form of peroneal artery and 0 vessel runoff on the right leg with reconstitution of the major peroneal artery in the proximal segment by collaterals. Due to worsening symptoms of claudication in spite of aggressive medical therapy, patient is also make lifestyle changes with quitting smoking, she was brought to the peripheral angiography suite to reattempt, evaluate peripheral anatomy for possible angioplasty.  Angiographic data: Right distal SFA and runoff below the knee: Right SFA showed mild disease. Right popliteal artery showed mild diffuse disease. There is diffuse moderate disease in the tibioperoneal trunk, followed by occlusion of the anterior tibial and posterior tibial arteries. The peroneal artery has a moderate length CTO followed by reconstitution, good vessel distally. At the level of the ankle, there is reconstitution of all 3 vessels via collaterals.  Interventional data: Successful PTA and balloon angioplasty following atherectomy of the right tibioperoneal trunk and peroneal artery which is CTO, stenosis reduced from 100% to 0%. A total of 90 cc of contrast was utilized for diagnostic angiography and interventional  procedure.  Technique: Under sterile precautions using a 5 French left femoral arterial access, a 5 [place] crossover cath was utilized to cross from the left femoral artery the right femoral artery. I utilized a Glidewire to cross into the common femoral artery and once I placed a crossover catheter into the common femoral artery, the guidewire was withdrawn and a Versacore wire was utilized to place it into the right superficial femoral artery. This was followed by placement of a 7 French 45 cm sheath into the proximal SFA on the right. Then I utilized a quick cross 0.018x135 cm catheter and using a Spartacore guidewire, I placed the quick cross catheter just proximal to the CTO.  Using Truepath CTO wire, with moderate difficulty, I was able to cross the CTO and the tip of the wire was carefully positioned in the distal peroneal artery. Then I was able to gently cross the quick cross catheter into the distal peroneal artery. I then exchanged the true path by her to a CSI Viperwire advance 0.017 x 335 cm wire, the tip of the wire was placed in the distal peroneal artery.  atherectomy was performed using a 1.25 mm Crown with multiple passes at low intermediate and high speed. This was followed by intra-arterial administration of nitroglycerin and repeat angiography was performed. There is still residual stenosis at the atherectomy site, this was followed by balloon angioplasty with a 3.0 x 80 mm Coyote over-the-wire balloon and balloon angioplasty x3 was performed at 4 atmospheric pressure for 2 minutes each followed by nitroglycerin administration and angiography. Excellent brisk flow without any residual dissection was evident. The guidewire was withdrawn the sheath was withdrawn into the left femoral artery and exchange for short sheath. Sheath was sutured in place. Patient tolerated  the procedure. The immediate competitions.

## 2013-10-05 NOTE — Interval H&P Note (Signed)
History and Physical Interval Note:  10/05/2013 7:44 AM  Joanne Morris  has presented today for surgery, with the diagnosis of pad  The various methods of treatment have been discussed with the patient and family. After consideration of risks, benefits and other options for treatment, the patient has consented to  Procedure(s): LOWER EXTREMITY ANGIOGRAM (N/A) and possible PTA as a surgical intervention .  The patient's history has been reviewed, patient examined, no change in status, stable for surgery.  I have reviewed the patient's chart and labs.  Questions were answered to the patient's satisfaction.     Laverda Page

## 2013-10-06 DIAGNOSIS — I70219 Atherosclerosis of native arteries of extremities with intermittent claudication, unspecified extremity: Secondary | ICD-10-CM | POA: Diagnosis not present

## 2013-10-06 LAB — GLUCOSE, CAPILLARY: Glucose-Capillary: 95 mg/dL (ref 70–99)

## 2013-10-06 MED ORDER — CLOPIDOGREL BISULFATE 75 MG PO TABS: 75.0000 mg | ORAL_TABLET | Freq: Every day | ORAL | Status: DC

## 2013-10-06 MED ORDER — CLOPIDOGREL BISULFATE 300 MG PO TABS
600.0000 mg | ORAL_TABLET | Freq: Once | ORAL | Status: AC
  Administered 2013-10-06: 600 mg via ORAL

## 2013-10-06 NOTE — Discharge Summary (Signed)
Physician Discharge Summary  Patient ID: Joanne Morris MRN: [id number] DOB/AGE: Jun 20, 1950 63 y.o.  Admit date: 2013-11-02 Discharge date: 10/06/2013  Primary Discharge Diagnosis PAD with claudication in Diabetes Mellitus S/P atherectomy right peroneal artery.  Secondary Discharge Diagnosis Hypertension Hyperlipidemia Diabetes mellitus type 2 controlled Prior history of tobacco use disorder  Significant Diagnostic Studies:  Peripheral arteriogram 11-02-13:  Angiographic data: Right distal SFA and runoff below the knee: Right SFA showed mild disease. Right popliteal artery showed mild diffuse disease. There is diffuse moderate disease in the tibioperoneal trunk, followed by occlusion of the anterior tibial and posterior tibial arteries. The peroneal artery has a moderate length CTO followed by reconstitution, good vessel distally. At the level of the ankle, there is reconstitution of all 3 vessels via collaterals.  Interventional data: Successful PTA and balloon angioplasty following atherectomy of the right tibioperoneal trunk and peroneal artery which is CTO, stenosis reduced from 100% to 0%. A total of 90 cc of contrast.   Hospital Course: patient admitted for elective peripheral arterial examination and arteriogram, underwent successful angioplasty to her right peroneal artery which is the symptomatic leg.  Patient tolerated the procedure well.  The foot remained stable, warm, the following morning patient was ablating the hallway and is already noticed significant improvement in claudication.  Left groin site had healed well and patient was felt stable for discharge.   Recommendations on discharge: patient will also be started on clopidogrel 75 mg by mouth daily along with aspirin 81 mg and cilostazol 50 mg by mouth twice a day.  I'll use clopidogrel for at least 8-12 weeks.  Discharge Exam: Blood pressure 115/61, pulse 73, temperature 98 F (36.7 C), temperature source Oral, resp.  rate 20, height 4\' 1"  (1.245 m), weight 58.2 kg (128 lb 4.9 oz), SpO2 99.00%.   General appearance: alert, cooperative, appears stated age and no distress Resp: clear to auscultation bilaterally Cardio: regular rate and rhythm, S1, S2 normal, no murmur, click, rub or gallop GI: soft, non-tender; bowel sounds normal; no masses,  no organomegaly Extremities: extremities normal, atraumatic, no cyanosis or edema Pulses: bilateral soft femoral bruit present.  Left groin  arterial access site has healed well.  Absent popliteal pulse bilaterally, pedal pulses absent bilaterally.  No evidence of acute arterial insufficiency.. Neurologic: Grossly normal Labs:   Lab Results  Component Value Date   WBC 7.8 07/06/2013   HGB 10.8* 07/06/2013   HCT 33.6* 07/06/2013   MCV 78.7 07/06/2013   PLT 351 07/06/2013   No results found for this basename: NA, K, CL, CO2, BUN, CREATININE, CALCIUM, LABALBU, PROT, BILITOT, ALKPHOS, ALT, AST, GLUCOSE,  in the last 168 hours Lab Results  Component Value Date   CKTOTAL 89 06/01/2011   CKMB 1.3 06/01/2011   TROPONINI <0.30 10/12/2012    Lipid Panel     Component Value Date/Time   CHOL 265* 10/11/2012 1943   TRIG 437* 10/11/2012 1943   HDL 52 10/11/2012 1943   CHOLHDL 5.1 10/11/2012 1943   VLDL UNABLE TO CALCULATE IF TRIGLYCERIDE OVER 400 mg/dL 10/11/2012 1943   LDLCALC UNABLE TO CALCULATE IF TRIGLYCERIDE OVER 400 mg/dL 10/11/2012 1943     FOLLOW UP PLANS AND APPOINTMENTS    Medication List         ALPRAZolam 0.25 MG tablet  Commonly known as:  XANAX  Take 0.25 mg by mouth daily as needed for anxiety.     amLODipine 10 MG tablet  Commonly known as:  NORVASC  Take 10 mg  by mouth daily.     aspirin EC 81 MG tablet  Take 81 mg by mouth daily.     cilostazol 100 MG tablet  Commonly known as:  PLETAL  Take 100 mg by mouth 2 (two) times daily.     clopidogrel 75 MG tablet  Commonly known as:  PLAVIX  Take 1 tablet (75 mg total) by mouth daily.      HYDROcodone-acetaminophen 5-325 MG per tablet  Commonly known as:  NORCO/VICODIN  Take 1 tablet by mouth every 6 (six) hours as needed for moderate pain.     lisinopril-hydrochlorothiazide 20-12.5 MG per tablet  Commonly known as:  PRINZIDE,ZESTORETIC  Take 1 tablet by mouth 2 (two) times daily.     meloxicam 7.5 MG tablet  Commonly known as:  MOBIC  Take 7.5 mg by mouth daily as needed for pain.     metFORMIN 500 MG tablet  Commonly known as:  GLUCOPHAGE  Take 1 tablet (500 mg total) by mouth daily with breakfast.     methocarbamol 500 MG tablet  Commonly known as:  ROBAXIN  Take 500 mg by mouth every 6 (six) hours as needed for muscle spasms.     nitroGLYCERIN 0.4 MG SL tablet  Commonly known as:  NITROSTAT  Place 0.4 mg under the tongue every 5 (five) minutes as needed. For chest pain     omeprazole 20 MG capsule  Commonly known as:  PRILOSEC  Take 20 mg by mouth daily.     pravastatin 40 MG tablet  Commonly known as:  PRAVACHOL  Take 40 mg by mouth daily.     traMADol 50 MG tablet  Commonly known as:  ULTRAM  Take 1 tablet (50 mg total) by mouth every 6 (six) hours as needed for moderate pain.          Laverda Page, MD 10/06/2013, 1:35 PM  Pager: [phone] Office: [phone] If no answer: [phone]

## 2013-12-08 ENCOUNTER — Other Ambulatory Visit (HOSPITAL_COMMUNITY): Payer: Self-pay | Admitting: Internal Medicine

## 2013-12-08 DIAGNOSIS — I7090 Unspecified atherosclerosis: Secondary | ICD-10-CM

## 2013-12-10 ENCOUNTER — Ambulatory Visit (HOSPITAL_COMMUNITY)
Admission: RE | Admit: 2013-12-10 | Discharge: 2013-12-10 | Disposition: A | Discharge status: Home / Self Care | Payer: Medicare HMO | Source: Ambulatory Visit | Attending: Vascular Surgery | Admitting: Vascular Surgery

## 2013-12-10 DIAGNOSIS — I6523 Occlusion and stenosis of bilateral carotid arteries: Secondary | ICD-10-CM | POA: Insufficient documentation

## 2013-12-10 DIAGNOSIS — I7090 Unspecified atherosclerosis: Secondary | ICD-10-CM

## 2013-12-10 DIAGNOSIS — I739 Peripheral vascular disease, unspecified: Secondary | ICD-10-CM

## 2013-12-10 NOTE — Progress Notes (Signed)
*  PRELIMINARY RESULTS* Vascular Ultrasound Carotid Duplex (Doppler) has been completed.  Preliminary findings: Right = 60-79% ICA stenosis. Left = 80-99% ICA stenosis. Antegrade vertebral flow.   Called results to [place]. Per Dr. Jeanie Cooks, patient can leave and they will contact her.    Landry Mellow, RDMS, RVT  12/10/2013, 1:38 PM

## 2013-12-23 ENCOUNTER — Encounter: Payer: Self-pay | Admitting: Vascular Surgery

## 2013-12-23 ENCOUNTER — Other Ambulatory Visit: Payer: Self-pay

## 2013-12-23 DIAGNOSIS — I6522 Occlusion and stenosis of left carotid artery: Secondary | ICD-10-CM

## 2013-12-23 DIAGNOSIS — Z01810 Encounter for preprocedural cardiovascular examination: Secondary | ICD-10-CM

## 2013-12-27 ENCOUNTER — Other Ambulatory Visit: Payer: Self-pay | Admitting: *Deleted

## 2013-12-27 DIAGNOSIS — I6522 Occlusion and stenosis of left carotid artery: Secondary | ICD-10-CM

## 2013-12-27 DIAGNOSIS — I6521 Occlusion and stenosis of right carotid artery: Secondary | ICD-10-CM

## 2013-12-28 ENCOUNTER — Encounter: Payer: Self-pay | Admitting: Vascular Surgery

## 2013-12-29 ENCOUNTER — Encounter: Payer: Self-pay | Admitting: Vascular Surgery

## 2013-12-29 ENCOUNTER — Ambulatory Visit (HOSPITAL_COMMUNITY)
Admission: RE | Admit: 2013-12-29 | Discharge: 2013-12-29 | Disposition: A | Discharge status: Home / Self Care | Payer: Medicare HMO | Source: Ambulatory Visit | Attending: Vascular Surgery | Admitting: Vascular Surgery

## 2013-12-29 ENCOUNTER — Ambulatory Visit (INDEPENDENT_AMBULATORY_CARE_PROVIDER_SITE_OTHER): Payer: Medicare HMO | Admitting: Vascular Surgery

## 2013-12-29 VITALS — BP 142/64 | HR 94 | Resp 16 | Ht <= 58 in | Wt 123.0 lb

## 2013-12-29 DIAGNOSIS — I6522 Occlusion and stenosis of left carotid artery: Secondary | ICD-10-CM | POA: Diagnosis present

## 2013-12-29 DIAGNOSIS — I739 Peripheral vascular disease, unspecified: Secondary | ICD-10-CM

## 2013-12-29 DIAGNOSIS — I6523 Occlusion and stenosis of bilateral carotid arteries: Secondary | ICD-10-CM

## 2013-12-29 DIAGNOSIS — Z01810 Encounter for preprocedural cardiovascular examination: Secondary | ICD-10-CM

## 2013-12-29 NOTE — Addendum Note (Signed)
Addended by: Mena Goes on: 12/29/2013 11:05 AM   Modules accepted: Orders

## 2013-12-29 NOTE — Progress Notes (Signed)
[facility] [place] HISTORY AND PHYSICAL   History of Present Illness:  Patient is a 63 y.o. year old female who presents for evaluation of asymptomatic left internal carotid artery stenosis. The patient denies any recent symptoms of TIA amaurosis or stroke. She states she did have a stroke 8 years ago and was treated at [facility]. She does not recall what symptoms she had at that time. She has several complaints of aches and pains today. She complains of pain in her right neck below the jaw and radiating into her right anterior chest with activities and occasionally occurring at rest. She states that she has previously had a coronary stent placed in [place]. She also complains of pain in her right leg with walking. She previously has had angioplasty of her right peroneal artery by Dr. [name].  She had relief of her leg symptoms on the right side for approximately one month but states they are now worse again. She also complains of pain in her right foot. Some of the symptoms may be related to peripheral arterial disease but some also sound like degenerative joint changes. Other medical problems include diabetes, hypertension, elevated cholesterol.  These are all currently stable.  She is a former smoker but quit several years ago.  She is on Plavix and aspirin.  Past Medical History  Diagnosis Date  . Hypertension   . Coronary artery disease   . Angina   . Depression   . Claudication   . PAD (peripheral artery disease)   . High cholesterol   . Myocardial infarction 1990's    "1"  . Type II diabetes mellitus   . GERD (gastroesophageal reflux disease)   . Stroke     "mini stroke 1st then regular stroke", denies residual on 07/20/2013  . Arthritis     "knees" (07/20/2013)  . Anxiety     Past Surgical History  Procedure Laterality Date  . Abdominal hysterectomy    . Tubal ligation    . Esophagogastroduodenoscopy N/A 10/13/2012    Procedure:  ESOPHAGOGASTRODUODENOSCOPY (EGD);  Surgeon: Gatha Mayer, MD;  Location: [facility] ENDOSCOPY;  Service: Endoscopy;  Laterality: N/A;  . Lower extremity angiogram  07/20/2013    Unsuccessful attempt at crossing the CTO/notes 07/20/2013  . Coronary angioplasty with stent placement      "1"  . Balloon angioplasty, artery  10/05/2013    DR [name]  . Atherectomy  10/05/2013    Social History History  Substance Use Topics  . Smoking status: Former Smoker -- 0.50 packs/day for 4 years    Types: Cigarettes    Quit date: 05/30/2009  . Smokeless tobacco: Never Used  . Alcohol Use: No    Family History No family history on file.  Allergies  No Known Allergies   Current Outpatient Prescriptions  Medication Sig Dispense Refill  . ALPRAZolam (XANAX) 0.25 MG tablet Take 0.25 mg by mouth daily as needed for anxiety.    Marland Kitchen aspirin EC 81 MG tablet Take 81 mg by mouth daily.    . cilostazol (PLETAL) 100 MG tablet Take 100 mg by mouth 2 (two) times daily.     . clopidogrel (PLAVIX) 75 MG tablet Take 1 tablet (75 mg total) by mouth daily. 30 tablet 3  . HYDROcodone-acetaminophen (NORCO/VICODIN) 5-325 MG per tablet Take 1 tablet by mouth every 6 (six) hours as needed for moderate pain.     Marland Kitchen lisinopril-hydrochlorothiazide (PRINZIDE,ZESTORETIC) 20-12.5 MG per tablet Take 1 tablet by mouth 2 (  two) times daily.     . meloxicam (MOBIC) 7.5 MG tablet Take 7.5 mg by mouth daily as needed for pain.     . metFORMIN (GLUCOPHAGE) 500 MG tablet Take 1 tablet (500 mg total) by mouth daily with breakfast.    . methocarbamol (ROBAXIN) 500 MG tablet Take 500 mg by mouth every 6 (six) hours as needed for muscle spasms.     Marland Kitchen omeprazole (PRILOSEC) 20 MG capsule Take 20 mg by mouth daily.    . pravastatin (PRAVACHOL) 40 MG tablet Take 40 mg by mouth daily.    Marland Kitchen amLODipine (NORVASC) 10 MG tablet Take 10 mg by mouth daily.    . nitroGLYCERIN (NITROSTAT) 0.4 MG SL tablet Place 0.4 mg under the tongue every 5 (five) minutes as  needed. For chest pain    . traMADol (ULTRAM) 50 MG tablet Take 1 tablet (50 mg total) by mouth every 6 (six) hours as needed for moderate pain. 60 tablet 0   No current facility-administered medications for this visit.    ROS:   General:  No weight loss, Fever, chills  HEENT: No recent headaches, no nasal bleeding, no visual changes, no sore throat  Neurologic: No dizziness, blackouts, seizures. No recent symptoms of stroke or mini- stroke. No recent episodes of slurred speech, or temporary blindness.  Cardiac: + recent episodes of chest pain/pressure, no shortness of breath at rest.  No shortness of breath with exertion.  Denies history of atrial fibrillation or irregular heartbeat  Vascular: No history of rest pain in feet.  +  history of claudication.  No history of non-healing ulcer, No history of DVT   Pulmonary: No home oxygen, no productive cough, no hemoptysis,  No asthma or wheezing  Musculoskeletal:  [ ]  Arthritis, [ ]  Low back pain,  [ x] Joint pain  Hematologic:No history of hypercoagulable state.  No history of easy bleeding.  No history of anemia  Gastrointestinal: No hematochezia or melena,  No gastroesophageal reflux, no trouble swallowing  Urinary: [ ]  chronic Kidney disease, [ ]  on HD - [ ]  MWF or [ ]  TTHS, [ ]  Burning with urination, [ ]  Frequent urination, [ ]  Difficulty urinating;   Skin: No rashes  Psychological: No history of anxiety,  No history of depression   Physical Examination  Filed Vitals:   12/29/13 0936 12/29/13 0937  BP: 143/74 142/64  Pulse: 100 94  Resp: 16   Height: 4\' 1"  (1.245 m)   Weight: 123 lb (55.792 kg)     Body mass index is 35.99 kg/(m^2).  General:  Alert and oriented, no acute distress HEENT: Normal Neck: right side soft carotid bruit no left carotid bruit Pulmonary: Clear to auscultation bilaterally Cardiac: Regular Rate and Rhythm with grade 2 to 3/6 systolic murmur heard throughout precordium Abdomen: Soft,  non-tender, non-distended, no mass Skin: No rash Extremity Pulses:  2+ radial, brachial, femoral,absent dorsalis pedis, posterior tibial pulses bilaterally Musculoskeletal: No deformity or edema  Neurologic: Upper and lower extremity motor 5/5 and symmetric  DATA:  Carotid duplex scan dated October 30 from [facility] is reviewed today. This shows a high-grade greater than 80% stenosis of the left internal carotid artery with velocities of 365/122. This showed a moderate right internal carotid artery stenosis. There was antegrade vertebral flow. She had a repeat carotid duplex scan in our office today. However this only showed a 40-60% stenosis with much lower velocities although potentially underestimated due to calcific plaque.   ASSESSMENT:  #  1. Left internal carotid artery stenosis, asymptomatic. We now have to ultrasounds 1 suggesting greater than 80% 1 suggesting less than 60%. I believe we need an additional diagnostic test to determine whether or not this is a high-grade stenosis or not. We will order a CT angiogram of the neck to further define the patient's carotid stenosis.                              #2 right neck and chest pain. I reassured the patient that carotid disease is not cause neck pain. This pain usually occurs with activity. This could be an anginal equivalent. Since she has seen Dr. [name] in the past we will refer her to him for further evaluation of her cardiac status especially in light of the fact she may require carotid endarterectomy.                             #3 right leg pain most likely multifactorial. I reviewed her recent arteriogram. She had an angioplasty of her peroneal artery. Her aortoiliac common femoral superficial femoral and popliteal artery was widely patent. I would not consider any intervention for claudication symptoms alone for tibial disease whether this be with angioplasty stenting or operation. I believe that the risk of recurrence is high  and believe the benefit is low. I advised her that if she has further questions regarding the angioplasty the Dr. [name] previously performed on her she could ask at her appointment.   PLAN:  See above. The patient will follow-up with me after her CT angiogram of the neck.  Ruta Hinds, MD [facility] [place] Office: [phone] Pager: [phone]

## 2014-01-12 ENCOUNTER — Encounter: Payer: Self-pay | Admitting: Vascular Surgery

## 2014-01-13 ENCOUNTER — Ambulatory Visit (INDEPENDENT_AMBULATORY_CARE_PROVIDER_SITE_OTHER): Payer: Medicare HMO | Admitting: Vascular Surgery

## 2014-01-13 ENCOUNTER — Ambulatory Visit
Admission: RE | Admit: 2014-01-13 | Discharge: 2014-01-13 | Disposition: A | Discharge status: Home / Self Care | Payer: Medicare HMO | Source: Ambulatory Visit | Attending: Vascular Surgery | Admitting: Vascular Surgery

## 2014-01-13 ENCOUNTER — Encounter: Payer: Self-pay | Admitting: Vascular Surgery

## 2014-01-13 VITALS — BP 148/65 | HR 85 | Resp 14 | Ht <= 58 in | Wt 124.0 lb

## 2014-01-13 DIAGNOSIS — I6523 Occlusion and stenosis of bilateral carotid arteries: Secondary | ICD-10-CM

## 2014-01-13 DIAGNOSIS — Z01810 Encounter for preprocedural cardiovascular examination: Secondary | ICD-10-CM

## 2014-01-13 MED ORDER — IOHEXOL 350 MG/ML SOLN
65.0000 mL | Freq: Once | INTRAVENOUS | Status: AC | PRN
  Administered 2014-01-13: 65 mL via INTRAVENOUS

## 2014-01-13 NOTE — Progress Notes (Signed)
[facility] [place] HISTORY AND PHYSICAL    History of Present Illness:  Patient is a 63 y.o. year old female who presents for evaluation of asymptomatic left internal carotid artery stenosis. The patient denies any recent symptoms of TIA amaurosis or stroke. She states she did have a stroke 8 years ago and was treated at [facility]. She does not recall what symptoms she had at that time.    She states that she has previously had a coronary stent placed in [facility]. She also complains of pain in her right leg with walking.   She previously has had angioplasty of her right peroneal artery by Dr. [name].  She had relief of her leg symptoms on the right side for approximately one month but states they are now worse again. She also complains of pain in her right foot. Some of the symptoms may be related to peripheral arterial disease but some also sound like degenerative joint changes. Other medical problems include diabetes, hypertension, elevated cholesterol.  These are all currently stable.  She is a former smoker but quit several years ago.  She is on Plavix and aspirin.    Past Medical History   Diagnosis  Date   .  Hypertension     .  Coronary artery disease     .  Angina     .  Depression     .  Claudication     .  PAD (peripheral artery disease)     .  High cholesterol     .  Myocardial infarction  1990's       "1"   .  Type II diabetes mellitus     .  GERD (gastroesophageal reflux disease)     .  Stroke         "mini stroke 1st then regular stroke", denies residual on 07/20/2013   .  Arthritis         "knees" (07/20/2013)   .  Anxiety         Past Surgical History   Procedure  Laterality  Date   .  Abdominal hysterectomy       .  Tubal ligation       .  Esophagogastroduodenoscopy  N/A  10/13/2012       Procedure: ESOPHAGOGASTRODUODENOSCOPY (EGD);  Surgeon: Gatha Mayer, MD;  Location: [facility] ENDOSCOPY;  Service: Endoscopy;  Laterality: N/A;   .  Lower  extremity angiogram    07/20/2013       Unsuccessful attempt at crossing the CTO/notes 07/20/2013   .  Coronary angioplasty with stent placement           "1"   .  Balloon angioplasty, artery    10/05/2013       DR [name]   .  Atherectomy    10/05/2013     Social History History   Substance Use Topics   .  Smoking status:  Former Smoker -- 0.50 packs/day for 4 years       Types:  Cigarettes       Quit date:  05/30/2009   .  Smokeless tobacco:  Never Used   .  Alcohol Use:  No     Family History No family history on file.  Allergies  No Known Allergies     Current Outpatient Prescriptions   Medication  Sig  Dispense  Refill   .  ALPRAZolam (XANAX) 0.25 MG tablet  Take 0.25 mg by mouth  daily as needed for anxiety.       Marland Kitchen  aspirin EC 81 MG tablet  Take 81 mg by mouth daily.       .  cilostazol (PLETAL) 100 MG tablet  Take 100 mg by mouth 2 (two) times daily.        .  clopidogrel (PLAVIX) 75 MG tablet  Take 1 tablet (75 mg total) by mouth daily.  30 tablet  3   .  HYDROcodone-acetaminophen (NORCO/VICODIN) 5-325 MG per tablet  Take 1 tablet by mouth every 6 (six) hours as needed for moderate pain.        Marland Kitchen  lisinopril-hydrochlorothiazide (PRINZIDE,ZESTORETIC) 20-12.5 MG per tablet  Take 1 tablet by mouth 2 (two) times daily.        .  meloxicam (MOBIC) 7.5 MG tablet  Take 7.5 mg by mouth daily as needed for pain.        .  metFORMIN (GLUCOPHAGE) 500 MG tablet  Take 1 tablet (500 mg total) by mouth daily with breakfast.       .  methocarbamol (ROBAXIN) 500 MG tablet  Take 500 mg by mouth every 6 (six) hours as needed for muscle spasms.        Marland Kitchen  omeprazole (PRILOSEC) 20 MG capsule  Take 20 mg by mouth daily.       .  pravastatin (PRAVACHOL) 40 MG tablet  Take 40 mg by mouth daily.       Marland Kitchen  amLODipine (NORVASC) 10 MG tablet  Take 10 mg by mouth daily.       .  nitroGLYCERIN (NITROSTAT) 0.4 MG SL tablet  Place 0.4 mg under the tongue every 5 (five) minutes as needed. For chest pain        .  traMADol (ULTRAM) 50 MG tablet  Take 1 tablet (50 mg total) by mouth every 6 (six) hours as needed for moderate pain.  60 tablet  0      No current facility-administered medications for this visit.     ROS:    General:  No weight loss, Fever, chills  HEENT: No recent headaches, no nasal bleeding, no visual changes, no sore throat  Neurologic: No dizziness, blackouts, seizures. No recent symptoms of stroke or mini- stroke. No recent episodes of slurred speech, or temporary blindness.  Cardiac: + recent episodes of chest pain/pressure, no shortness of breath at rest.  No shortness of breath with exertion.  Denies history of atrial fibrillation or irregular heartbeat  Vascular: No history of rest pain in feet.  +  history of claudication.  No history of non-healing ulcer, No history of DVT    Pulmonary: No home oxygen, no productive cough, no hemoptysis,  No asthma or wheezing  Musculoskeletal:  [ ]  Arthritis, [ ]  Low back pain,  [ x] Joint pain  Hematologic:No history of hypercoagulable state.  No history of easy bleeding.  No history of anemia  Gastrointestinal: No hematochezia or melena,  No gastroesophageal reflux, no trouble swallowing  Urinary: [ ]  chronic Kidney disease, [ ]  on HD - [ ]  MWF or [ ]  TTHS, [ ]  Burning with urination, [ ]  Frequent urination, [ ]  Difficulty urinating;    Skin: No rashes  Psychological: No history of anxiety,  No history of depression   Physical Examination    Filed Vitals:   01/13/14 1542 01/13/14 1546  BP: 159/61 148/65  Pulse: 83 85  Resp: 14   Height: 4\' 1"  (1.245 m)  Weight: 124 lb (56.246 kg)     General:  Alert and oriented, no acute distress HEENT: Normal Neck: right side soft carotid bruit no left carotid bruit Pulmonary: Clear to auscultation bilaterally Cardiac: Regular Rate and Rhythm with grade 2 to 3/6 systolic murmur heard throughout precordium Abdomen: Soft, non-tender, non-distended, no mass Skin: No  rash Extremity Pulses:  2+ radial, brachial, femoral,absent dorsalis pedis, posterior tibial pulses bilaterally Musculoskeletal: No deformity or edema     Neurologic: Upper and lower extremity motor 5/5 and symmetric  DATA:  Carotid duplex scan dated October 30 from [facility] is reviewed today. This shows a high-grade greater than 80% stenosis of the left internal carotid artery with velocities of 365/122. This showed a moderate right internal carotid artery stenosis. There was antegrade vertebral flow. She had a repeat carotid duplex scan in our office a few weeks ago. However this only showed a 40-60% stenosis with much lower velocities although potentially underestimated due to calcific plaque.  She had a CT angiogram of her neck today. I reviewed these images This shows a high-grade calcified stenosis of the left carotid bifurcation with a string sign as well as a moderate to high-grade stenosis of the right carotid bifurcation probably 60-80%.   ASSESSMENT:  #1. Left internal carotid artery stenosis, asymptomatic. Greater than 80% by CT angiogram. She needs left carotid endarterectomy. We will stop her Plavix and Pletal today. She will continue her aspirin. Left carotid endarterectomy scheduled for December 9. Risks benefits possible complications and procedure details were trying to the patient today including but not limited to bleeding infection stroke risk of 1-2% cranial nerve injury risk of 10-15%. She understands and agrees to proceed.   #2 right neck and chest pain. I reassured the patient that carotid disease is not cause neck pain.  She was recently seen by Dr. Colan Neptune to be reasonable from a cardiac standpoint for carotid endarterectomy.                               #3 right leg pain most likely multifactorial.I would not consider any intervention for claudication symptoms alone for tibial disease whether this be with angioplasty stenting or operation. I believe that the  risk of recurrence is high and believe the benefit is low. I advised her that if she has further questions regarding the angioplasty the Dr. [name] previously performed on her she could ask at her appointment.   PLAN:  left carotid endarterectomy December 9. Stop Plavix and Pletal today  Ruta Hinds, MD [facility] [place] Office: [phone] Pager: [phone]

## 2014-01-14 ENCOUNTER — Other Ambulatory Visit: Payer: Self-pay

## 2014-01-18 ENCOUNTER — Encounter (HOSPITAL_COMMUNITY): Payer: Self-pay

## 2014-01-18 ENCOUNTER — Inpatient Hospital Stay (HOSPITAL_COMMUNITY): Payer: Medicare HMO | Admitting: Vascular Surgery

## 2014-01-18 ENCOUNTER — Encounter (HOSPITAL_COMMUNITY)
Admission: RE | Admit: 2014-01-18 | Discharge: 2014-01-18 | Disposition: A | Discharge status: Home / Self Care | Payer: Medicare HMO | Source: Ambulatory Visit | Attending: Vascular Surgery | Admitting: Vascular Surgery

## 2014-01-18 ENCOUNTER — Inpatient Hospital Stay (HOSPITAL_COMMUNITY): Payer: Medicare HMO | Admitting: Anesthesiology

## 2014-01-18 ENCOUNTER — Other Ambulatory Visit: Payer: Self-pay | Admitting: *Deleted

## 2014-01-18 DIAGNOSIS — Z79899 Other long term (current) drug therapy: Secondary | ICD-10-CM | POA: Diagnosis not present

## 2014-01-18 DIAGNOSIS — Z791 Long term (current) use of non-steroidal anti-inflammatories (NSAID): Secondary | ICD-10-CM | POA: Diagnosis not present

## 2014-01-18 DIAGNOSIS — I2510 Atherosclerotic heart disease of native coronary artery without angina pectoris: Secondary | ICD-10-CM | POA: Diagnosis not present

## 2014-01-18 DIAGNOSIS — Z5309 Procedure and treatment not carried out because of other contraindication: Secondary | ICD-10-CM | POA: Diagnosis not present

## 2014-01-18 DIAGNOSIS — I252 Old myocardial infarction: Secondary | ICD-10-CM | POA: Diagnosis not present

## 2014-01-18 DIAGNOSIS — Z87891 Personal history of nicotine dependence: Secondary | ICD-10-CM | POA: Diagnosis not present

## 2014-01-18 DIAGNOSIS — Z7902 Long term (current) use of antithrombotics/antiplatelets: Secondary | ICD-10-CM | POA: Diagnosis not present

## 2014-01-18 DIAGNOSIS — M79604 Pain in right leg: Secondary | ICD-10-CM | POA: Diagnosis not present

## 2014-01-18 DIAGNOSIS — I6522 Occlusion and stenosis of left carotid artery: Secondary | ICD-10-CM | POA: Diagnosis present

## 2014-01-18 DIAGNOSIS — Z955 Presence of coronary angioplasty implant and graft: Secondary | ICD-10-CM | POA: Diagnosis not present

## 2014-01-18 DIAGNOSIS — I6501 Occlusion and stenosis of right vertebral artery: Secondary | ICD-10-CM | POA: Diagnosis not present

## 2014-01-18 DIAGNOSIS — E119 Type 2 diabetes mellitus without complications: Secondary | ICD-10-CM | POA: Diagnosis not present

## 2014-01-18 DIAGNOSIS — Z79891 Long term (current) use of opiate analgesic: Secondary | ICD-10-CM | POA: Diagnosis not present

## 2014-01-18 DIAGNOSIS — K219 Gastro-esophageal reflux disease without esophagitis: Secondary | ICD-10-CM | POA: Diagnosis not present

## 2014-01-18 DIAGNOSIS — Z7982 Long term (current) use of aspirin: Secondary | ICD-10-CM | POA: Diagnosis not present

## 2014-01-18 DIAGNOSIS — Z8673 Personal history of transient ischemic attack (TIA), and cerebral infarction without residual deficits: Secondary | ICD-10-CM | POA: Diagnosis not present

## 2014-01-18 DIAGNOSIS — I10 Essential (primary) hypertension: Secondary | ICD-10-CM | POA: Diagnosis not present

## 2014-01-18 DIAGNOSIS — M1990 Unspecified osteoarthritis, unspecified site: Secondary | ICD-10-CM | POA: Diagnosis not present

## 2014-01-18 DIAGNOSIS — E780 Pure hypercholesterolemia: Secondary | ICD-10-CM | POA: Diagnosis not present

## 2014-01-18 HISTORY — DX: Occlusion and stenosis of unspecified carotid artery: I65.29

## 2014-01-18 HISTORY — DX: Presence of spectacles and contact lenses: Z97.3

## 2014-01-18 LAB — COMPREHENSIVE METABOLIC PANEL
ALBUMIN: 4.8 g/dL (ref 3.5–5.2)
ALT: 11 U/L (ref 0–35)
AST: 19 U/L (ref 0–37)
Alkaline Phosphatase: 60 U/L (ref 39–117)
Anion gap: 14 (ref 5–15)
BUN: 12 mg/dL (ref 6–23)
CALCIUM: 10.6 mg/dL — AB (ref 8.4–10.5)
CHLORIDE: 102 meq/L (ref 96–112)
CO2: 26 mEq/L (ref 19–32)
CREATININE: 0.92 mg/dL (ref 0.50–1.10)
GFR calc Af Amer: 75 mL/min — ABNORMAL LOW (ref >90)
GFR calc non Af Amer: 65 mL/min — ABNORMAL LOW (ref >90)
Glucose, Bld: 94 mg/dL (ref 70–99)
Potassium: 3.9 mEq/L (ref 3.7–5.3)
Sodium: 142 mEq/L (ref 137–147)
TOTAL PROTEIN: 8.2 g/dL (ref 6.0–8.3)
Total Bilirubin: 0.3 mg/dL (ref 0.3–1.2)

## 2014-01-18 LAB — CBC
HCT: 33.3 % — ABNORMAL LOW (ref 36.0–46.0)
Hemoglobin: 10.5 g/dL — ABNORMAL LOW (ref 12.0–15.0)
MCH: 24 pg — AB (ref 26.0–34.0)
MCHC: 31.5 g/dL (ref 30.0–36.0)
MCV: 76 fL — ABNORMAL LOW (ref 78.0–100.0)
PLATELETS: 280 10*3/uL (ref 150–400)
RBC: 4.38 MIL/uL (ref 3.87–5.11)
RDW: 16 % — ABNORMAL HIGH (ref 11.5–15.5)
WBC: 6.3 10*3/uL (ref 4.0–10.5)

## 2014-01-18 LAB — PROTIME-INR
INR: 0.95 (ref 0.00–1.49)
PROTHROMBIN TIME: 12.7 s (ref 11.6–15.2)

## 2014-01-18 LAB — URINALYSIS, ROUTINE W REFLEX MICROSCOPIC
BILIRUBIN URINE: NEGATIVE
Glucose, UA: NEGATIVE mg/dL
HGB URINE DIPSTICK: NEGATIVE
KETONES UR: NEGATIVE mg/dL
NITRITE: NEGATIVE
Protein, ur: NEGATIVE mg/dL
Specific Gravity, Urine: 1.003 — ABNORMAL LOW (ref 1.005–1.030)
UROBILINOGEN UA: 0.2 mg/dL (ref 0.0–1.0)
pH: 7.5 (ref 5.0–8.0)

## 2014-01-18 LAB — URINE MICROSCOPIC-ADD ON

## 2014-01-18 LAB — TYPE AND SCREEN
ABO/RH(D): O POS
ANTIBODY SCREEN: NEGATIVE

## 2014-01-18 LAB — SURGICAL PCR SCREEN
MRSA, PCR: POSITIVE — AB
Staphylococcus aureus: POSITIVE — AB

## 2014-01-18 LAB — APTT: aPTT: 35 seconds (ref 24–37)

## 2014-01-18 LAB — ABO/RH: ABO/RH(D): O POS

## 2014-01-18 MED ORDER — DEXTROSE 5 % IV SOLN
1.5000 g | INTRAVENOUS | Status: DC
  Filled 2014-01-18: qty 1.5

## 2014-01-18 MED ORDER — CHLORHEXIDINE GLUCONATE CLOTH 2 % EX PADS: 6.0000 | MEDICATED_PAD | Freq: Once | CUTANEOUS | Status: DC

## 2014-01-18 MED ORDER — SODIUM CHLORIDE 0.9 % IV SOLN: INTRAVENOUS | Status: DC

## 2014-01-18 NOTE — Progress Notes (Signed)
Spoke with Dorene Sorrow", RN to make MD aware of abnormal UA.

## 2014-01-18 NOTE — Pre-Procedure Instructions (Signed)
Joanne Morris  01/18/2014   Your procedure is scheduled on: Wednesday, January 19, 2014  Report to [facility] Admitting at 6:30 AM.  Call this number if you have problems the morning of surgery: [phone]   Remember:   Do not eat food or drink liquids after midnight.   Take these medicines the morning of surgery with A SIP OF WATER: amLODipine (NORVASC),aspirin,  omeprazole (PRILOSEC), if needed:ALPRAZolam Joanne Morris) for anxiety, HYDROcodone (NORCO/VICODIN)  OR traMADol (ULTRAM) for pain, nitroGLYCERIN (NITROSTAT) for chest pain DO NOT TAKE ANY DIABETIC MEDICATION THE MORNING OF PROCEDURE  Stop taking cilostazol (PLETAL), clopidogrel (PLAVIX)  per MD,  Vitamins,  and herbal medications. Do not take any NSAIDs ie: Ibuprofen, Advil, Naproxen or meloxicam (MOBIC).   Do not wear jewelry, make-up or nail polish.  Do not wear lotions, powders, or perfumes. You may not wear deodorant.  Do not shave 48 hours prior to surgery.   Do not bring valuables to the hospital.  [facility] is not responsible for any belongings or valuables.               Contacts, dentures or bridgework may not be worn into surgery.  Leave suitcase in the car. After surgery it may be brought to your room.  For patients admitted to the hospital, discharge time is determined by your treatment team.               Patients discharged the day of surgery will not be allowed to drive home.  Name and phone number of your driver:   Special Instructions:  Special Instructions:Special Instructions: [facility] - Preparing for Surgery  Before surgery, you can play an important role.  Because skin is not sterile, your skin needs to be as free of germs as possible.  You can reduce the number of germs on you skin by washing with CHG (chlorahexidine gluconate) soap before surgery.  CHG is an antiseptic cleaner which kills germs and bonds with the skin to continue killing germs even after washing.  Please DO NOT use if you  have an allergy to CHG or antibacterial soaps.  If your skin becomes reddened/irritated stop using the CHG and inform your nurse when you arrive at Short Stay.  Do not shave (including legs and underarms) for at least 48 hours prior to the first CHG shower.  You may shave your face.  Please follow these instructions carefully:   1.  Shower with CHG Soap the night before surgery and the morning of Surgery.  2.  If you choose to wash your hair, wash your hair first as usual with your normal shampoo.  3.  After you shampoo, rinse your hair and body thoroughly to remove the Shampoo.  4.  Use CHG as you would any other liquid soap.  You can apply chg directly  to the skin and wash gently with scrungie or a clean washcloth.  5.  Apply the CHG Soap to your body ONLY FROM THE NECK DOWN.  Do not use on open wounds or open sores.  Avoid contact with your eyes, ears, mouth and genitals (private parts).  Wash genitals (private parts) with your normal soap.  6.  Wash thoroughly, paying special attention to the area where your surgery will be performed.  7.  Thoroughly rinse your body with warm water from the neck down.  8.  DO NOT shower/wash with your normal soap after using and rinsing off the CHG Soap.  9.  Joanne Morris  yourself dry with a clean towel.            10.  Wear clean pajamas.            11.  Place clean sheets on your bed the night of your first shower and do not sleep with pets.  Day of Surgery  Do not apply any lotions/deodorants the morning of surgery.  Please wear clean clothes to the hospital/surgery center.   Please read over the following fact sheets that you were given: Pain Booklet, Coughing and Deep Breathing, Blood Transfusion Information, MRSA Information and Surgical Site Infection Prevention

## 2014-01-18 NOTE — Progress Notes (Signed)
Pt stated that she stopped her Plavix and Pletal on Thursday 01/13/14 as instructed by MD.

## 2014-01-18 NOTE — Progress Notes (Signed)
Pt denies SOB and chest pain but is under the care of Dr. [name], cardiologist. Pt temp redone because pt stated that she had hot coffee prior to her arrival. Pt to sign consent form DOS because pt was unsure of which side of neck that she was having surgery on. When asked " what side of the neck are they operating on, she pointed to the right side and stated " they should be doing my right side because it hurts." I made her aware that the consent was for a left carotid endarterctomy. Spoke with Dorene Sorrow," RN at Dr. [name] office to make MD aware and confirm order for consent - order for consent is correct ( see Dr. [name] note dated 01/13/14 per "Zigmund Daniel " ). Pt chart forwarded to [place], [place] (anesthesia) for review of history; records requested from [facility] and Dr. [name] pending.

## 2014-01-18 NOTE — Progress Notes (Signed)
Anesthesia Note:  Patient is a 63 year old female scheduled for left CEA tomorrow by Dr. [name].  History includes former smoker, HTN, CAD/MI '90's s/p RCA stent '08 (HPR), angina, PAD with claudication (failed attempted angioplasty of right peroneal artery 07/20/13 but successful PT/atherectomy of right tibioperoneal trunk 10/05/13), hypercholesterolemia, GERD, DM2, CVA, anxiety, depression, arthritis, hysterectomy.    PCP is Dr. Nolene Ebbs. Cardiologist is Dr. [name] who signed a note of cardiac clearance with "low risk" and permission to hold Plavix for 5 days preoperatively if needed.  She reported holding Plavix since 01/13/14.    Meds listed include Xanax, Norco, Norvasc, ASA 81 mg, Pletal (on hold), Plavic (on hold), Zestorectic, Mobic, metformin, Robaxin, Nitro, Prilosec, Pravachol, tramadol.  Echo 07/01/13 [facility] CV): LV cavity is normal in size. Concentric LVH. Normal global wall motion. No wall motion abnormalities. Calculated LVEF 61%. Normal diastolic function. Mild calcification of the MV annulus. Mild posterior MV leaflet calcification with mildly restricted motion. Mild prolapse of the anterior MV leaflet. Mild to moderate posterior directed MR.  No MS.  Trace TR. No evidence of pulmonary hypertension.    Nuclear stress test 06/07/13 [facility] CV): Resting EKG NSR, poor r wave progression. Non-diagnostic stress EKG. No ST/T changes of ischemia noted with pharmacologic stress testing. Stress symptoms included being winded. Stress terminated due to completion of protocol. The perfusion study demonstrated normal isotope uptake both at rest and stress. There was no evidence of ischemia or scar.  Dynamic gated images reveal normal wall motion and endocardial thickening. LVEF estimated at 73%.  Cardiac cath 02/01/10 (HPR): RCA stent patent with mild in-stent restenosis (40% distal RCA, 35% proximal RCA). Other vessels have non-obstructive disease as before, LAD is slightly worse (35% mid LAD).  Mild inferobasal hypokinesis. Medical therapy.  CTA of the neck on 01/13/14: 1. Bulky calcified plaque at the left carotid bifurcation and extending into the left ICA origin and bulb resulting in HIGH-GRADE stenosis with RADIOGRAPHIC STRING SIGN. 2. HIGH-GRADE stenosis (up to 75%) right ICA origin due to bulky mostly calcified plaque. 3. Moderate to severe right vertebral artery origin stenosis. Mild to moderate stenosis of the intracranial right vertebral artery.  Preoperative labs noted.   If no acute changes then I anticipate that she can proceed as planned.  George Hugh [facility]/Anesthesiology Phone [phone] 01/18/2014 4:35 PM

## 2014-01-19 ENCOUNTER — Encounter (HOSPITAL_COMMUNITY)
Admission: RE | Disposition: A | Discharge status: Home / Self Care | Payer: Self-pay | Source: Ambulatory Visit | Attending: Vascular Surgery

## 2014-01-19 ENCOUNTER — Ambulatory Visit (HOSPITAL_COMMUNITY)
Admission: RE | Admit: 2014-01-19 | Discharge: 2014-01-19 | DRG: 068 | Disposition: A | Discharge status: Home / Self Care | Payer: Medicare HMO | Source: Ambulatory Visit | Attending: Vascular Surgery | Admitting: Vascular Surgery

## 2014-01-19 ENCOUNTER — Encounter (HOSPITAL_COMMUNITY): Payer: Self-pay | Admitting: *Deleted

## 2014-01-19 ENCOUNTER — Other Ambulatory Visit: Payer: Self-pay

## 2014-01-19 DIAGNOSIS — Z791 Long term (current) use of non-steroidal anti-inflammatories (NSAID): Secondary | ICD-10-CM | POA: Insufficient documentation

## 2014-01-19 DIAGNOSIS — I6501 Occlusion and stenosis of right vertebral artery: Secondary | ICD-10-CM | POA: Diagnosis not present

## 2014-01-19 DIAGNOSIS — I252 Old myocardial infarction: Secondary | ICD-10-CM | POA: Insufficient documentation

## 2014-01-19 DIAGNOSIS — I2510 Atherosclerotic heart disease of native coronary artery without angina pectoris: Secondary | ICD-10-CM | POA: Insufficient documentation

## 2014-01-19 DIAGNOSIS — Z955 Presence of coronary angioplasty implant and graft: Secondary | ICD-10-CM | POA: Insufficient documentation

## 2014-01-19 DIAGNOSIS — Z5309 Procedure and treatment not carried out because of other contraindication: Secondary | ICD-10-CM | POA: Insufficient documentation

## 2014-01-19 DIAGNOSIS — Z7982 Long term (current) use of aspirin: Secondary | ICD-10-CM | POA: Insufficient documentation

## 2014-01-19 DIAGNOSIS — E780 Pure hypercholesterolemia: Secondary | ICD-10-CM | POA: Insufficient documentation

## 2014-01-19 DIAGNOSIS — Z8673 Personal history of transient ischemic attack (TIA), and cerebral infarction without residual deficits: Secondary | ICD-10-CM | POA: Insufficient documentation

## 2014-01-19 DIAGNOSIS — I6522 Occlusion and stenosis of left carotid artery: Secondary | ICD-10-CM | POA: Diagnosis not present

## 2014-01-19 DIAGNOSIS — M79604 Pain in right leg: Secondary | ICD-10-CM | POA: Insufficient documentation

## 2014-01-19 DIAGNOSIS — Z79891 Long term (current) use of opiate analgesic: Secondary | ICD-10-CM | POA: Insufficient documentation

## 2014-01-19 DIAGNOSIS — Z79899 Other long term (current) drug therapy: Secondary | ICD-10-CM | POA: Insufficient documentation

## 2014-01-19 DIAGNOSIS — K219 Gastro-esophageal reflux disease without esophagitis: Secondary | ICD-10-CM | POA: Insufficient documentation

## 2014-01-19 DIAGNOSIS — Z7902 Long term (current) use of antithrombotics/antiplatelets: Secondary | ICD-10-CM | POA: Insufficient documentation

## 2014-01-19 DIAGNOSIS — E119 Type 2 diabetes mellitus without complications: Secondary | ICD-10-CM | POA: Insufficient documentation

## 2014-01-19 DIAGNOSIS — I10 Essential (primary) hypertension: Secondary | ICD-10-CM | POA: Insufficient documentation

## 2014-01-19 DIAGNOSIS — Z87891 Personal history of nicotine dependence: Secondary | ICD-10-CM | POA: Insufficient documentation

## 2014-01-19 DIAGNOSIS — M1990 Unspecified osteoarthritis, unspecified site: Secondary | ICD-10-CM | POA: Insufficient documentation

## 2014-01-19 LAB — URINALYSIS, ROUTINE W REFLEX MICROSCOPIC
Bilirubin Urine: NEGATIVE
Glucose, UA: NEGATIVE mg/dL
Hgb urine dipstick: NEGATIVE
Ketones, ur: NEGATIVE mg/dL
LEUKOCYTES UA: NEGATIVE
Nitrite: NEGATIVE
PH: 6.5 (ref 5.0–8.0)
Protein, ur: NEGATIVE mg/dL
SPECIFIC GRAVITY, URINE: 1.004 — AB (ref 1.005–1.030)
Urobilinogen, UA: 0.2 mg/dL (ref 0.0–1.0)

## 2014-01-19 LAB — GLUCOSE, CAPILLARY: Glucose-Capillary: 136 mg/dL — ABNORMAL HIGH (ref 70–99)

## 2014-01-19 SURGERY — ENDARTERECTOMY, CAROTID
Anesthesia: General

## 2014-01-19 MED ORDER — CHLORHEXIDINE GLUCONATE CLOTH 2 % EX PADS: 6.0000 | MEDICATED_PAD | Freq: Once | CUTANEOUS | Status: DC

## 2014-01-19 MED ORDER — ARTIFICIAL TEARS OP OINT
TOPICAL_OINTMENT | OPHTHALMIC | Status: AC
  Filled 2014-01-19: qty 3.5

## 2014-01-19 MED ORDER — SUCCINYLCHOLINE CHLORIDE 20 MG/ML IJ SOLN
INTRAMUSCULAR | Status: AC
  Filled 2014-01-19: qty 1

## 2014-01-19 MED ORDER — SODIUM CHLORIDE 0.9 % IV SOLN
INTRAVENOUS | Status: DC
Start: 2014-01-19 — End: 2014-01-19

## 2014-01-19 MED ORDER — ROCURONIUM BROMIDE 50 MG/5ML IV SOLN
INTRAVENOUS | Status: AC
  Filled 2014-01-19: qty 1

## 2014-01-19 MED ORDER — NEOSTIGMINE METHYLSULFATE 10 MG/10ML IV SOLN
INTRAVENOUS | Status: AC
  Filled 2014-01-19: qty 1

## 2014-01-19 MED ORDER — ONDANSETRON HCL 4 MG/2ML IJ SOLN
INTRAMUSCULAR | Status: AC
  Filled 2014-01-19: qty 2

## 2014-01-19 MED ORDER — EPHEDRINE SULFATE 50 MG/ML IJ SOLN
INTRAMUSCULAR | Status: AC
  Filled 2014-01-19: qty 1

## 2014-01-19 MED ORDER — THROMBIN 20000 UNITS EX SOLR
CUTANEOUS | Status: AC
  Filled 2014-01-19: qty 20000

## 2014-01-19 MED ORDER — PROPOFOL 10 MG/ML IV BOLUS
INTRAVENOUS | Status: AC
  Filled 2014-01-19: qty 20

## 2014-01-19 MED ORDER — PROPOFOL 10 MG/ML IV BOLUS
INTRAVENOUS | Status: AC
Start: 2014-01-19 — End: 2014-01-19
  Filled 2014-01-19: qty 20

## 2014-01-19 MED ORDER — GLYCOPYRROLATE 0.2 MG/ML IJ SOLN
INTRAMUSCULAR | Status: AC
  Filled 2014-01-19: qty 2

## 2014-01-19 MED ORDER — FENTANYL CITRATE 0.05 MG/ML IJ SOLN
INTRAMUSCULAR | Status: AC
  Filled 2014-01-19: qty 5

## 2014-01-19 MED ORDER — PHENYLEPHRINE 40 MCG/ML (10ML) SYRINGE FOR IV PUSH (FOR BLOOD PRESSURE SUPPORT)
PREFILLED_SYRINGE | INTRAVENOUS | Status: AC
  Filled 2014-01-19: qty 10

## 2014-01-19 MED ORDER — LIDOCAINE HCL (PF) 1 % IJ SOLN
INTRAMUSCULAR | Status: AC
  Filled 2014-01-19: qty 30

## 2014-01-19 SURGICAL SUPPLY — 45 items
BLADE SURG 10 STRL SS (BLADE) ×1 IMPLANT
CANISTER SUCTION 2500CC (MISCELLANEOUS) ×1 IMPLANT
CANNULA VESSEL 3MM 2 BLNT TIP (CANNULA) ×1 IMPLANT
CATH ROBINSON RED A/P 18FR (CATHETERS) ×1 IMPLANT
CLIP TI MEDIUM 6 (CLIP) ×1 IMPLANT
CLIP TI WIDE RED SMALL 6 (CLIP) ×1 IMPLANT
CRADLE DONUT ADULT HEAD (MISCELLANEOUS) ×1 IMPLANT
DECANTER SPIKE VIAL GLASS SM (MISCELLANEOUS) IMPLANT
DRAIN HEMOVAC 1/8 X 5 (WOUND CARE) IMPLANT
ELECT REM PT RETURN 9FT ADLT (ELECTROSURGICAL)
ELECTRODE REM PT RTRN 9FT ADLT (ELECTROSURGICAL) ×1 IMPLANT
EVACUATOR SILICONE 100CC (DRAIN) IMPLANT
GAUZE SPONGE 4X4 12PLY STRL (GAUZE/BANDAGES/DRESSINGS) ×1 IMPLANT
GEL ULTRASOUND 20GR AQUASONIC (MISCELLANEOUS) IMPLANT
GLOVE BIO SURGEON STRL SZ 6.5 (GLOVE) IMPLANT
GLOVE BIO SURGEON STRL SZ7.5 (GLOVE) ×1 IMPLANT
GLOVE BIOGEL PI IND STRL 7.0 (GLOVE) IMPLANT
GLOVE BIOGEL PI INDICATOR 7.0 (GLOVE)
GLOVE ECLIPSE 6.5 STRL STRAW (GLOVE) IMPLANT
GOWN STRL REUS W/ TWL LRG LVL3 (GOWN DISPOSABLE) ×3 IMPLANT
GOWN STRL REUS W/TWL LRG LVL3 (GOWN DISPOSABLE)
KIT BASIN OR (CUSTOM PROCEDURE TRAY) ×1 IMPLANT
KIT ROOM TURNOVER OR (KITS) ×1 IMPLANT
LIQUID BAND (GAUZE/BANDAGES/DRESSINGS) ×1 IMPLANT
LOOP VESSEL MINI RED (MISCELLANEOUS) IMPLANT
NDL HYPO 25GX1X1/2 BEV (NEEDLE) IMPLANT
NEEDLE HYPO 25GX1X1/2 BEV (NEEDLE) IMPLANT
NS IRRIG 1000ML POUR BTL (IV SOLUTION) ×2 IMPLANT
PACK CAROTID (CUSTOM PROCEDURE TRAY) ×1 IMPLANT
PAD ARMBOARD 7.5X6 YLW CONV (MISCELLANEOUS) ×2 IMPLANT
PROBE PENCIL 8 MHZ STRL DISP (MISCELLANEOUS) IMPLANT
SHUNT CAROTID BYPASS 10 (VASCULAR PRODUCTS) IMPLANT
SHUNT CAROTID BYPASS 12FRX15.5 (VASCULAR PRODUCTS) IMPLANT
SPONGE INTESTINAL PEANUT (DISPOSABLE) ×1 IMPLANT
SPONGE SURGIFOAM ABS GEL 100 (HEMOSTASIS) IMPLANT
SUT ETHILON 3 0 PS 1 (SUTURE) IMPLANT
SUT PROLENE 6 0 CC (SUTURE) ×1 IMPLANT
SUT PROLENE 7 0 BV 1 (SUTURE) IMPLANT
SUT SILK 3 0 TIES 17X18 (SUTURE)
SUT SILK 3-0 18XBRD TIE BLK (SUTURE) IMPLANT
SUT VIC AB 3-0 SH 27 (SUTURE)
SUT VIC AB 3-0 SH 27X BRD (SUTURE) ×1 IMPLANT
SUT VICRYL 4-0 PS2 18IN ABS (SUTURE) ×1 IMPLANT
SYR CONTROL 10ML LL (SYRINGE) IMPLANT
WATER STERILE IRR 1000ML POUR (IV SOLUTION) ×1 IMPLANT

## 2014-01-19 NOTE — Transfer of Care (Signed)
Case cancelled

## 2014-01-19 NOTE — Anesthesia Preprocedure Evaluation (Signed)
Anesthesia Evaluation  Patient identified by MRN, date of birth, ID band Patient awake    Reviewed: Allergy & Precautions, H&P , NPO status , Patient's Chart, lab work & pertinent test results  Airway Mallampati: I       Dental   Pulmonary former smoker,  breath sounds clear to auscultation        Cardiovascular hypertension, + angina + CAD, + Past MI and + Peripheral Vascular Disease Rhythm:Regular Rate:Normal     Neuro/Psych    GI/Hepatic   Endo/Other  diabetes  Renal/GU      Musculoskeletal  (+) Arthritis -,   Abdominal   Peds  Hematology   Anesthesia Other Findings   Reproductive/Obstetrics                             Anesthesia Physical Anesthesia Plan  ASA: III  Anesthesia Plan: General   Post-op Pain Management:    Induction:   Airway Management Planned: Oral ETT  Additional Equipment: Arterial line  Intra-op Plan:   Post-operative Plan: Extubation in OR  Informed Consent: I have reviewed the patients History and Physical, chart, labs and discussed the procedure including the risks, benefits and alternatives for the proposed anesthesia with the patient or authorized representative who has indicated his/her understanding and acceptance.   Dental advisory given  Plan Discussed with: CRNA and Surgeon  Anesthesia Plan Comments:         Anesthesia Quick Evaluation

## 2014-01-19 NOTE — Anesthesia Postprocedure Evaluation (Signed)
Case cancelled

## 2014-01-19 NOTE — Progress Notes (Addendum)
Pt. Stated she used nitroglycerine yesterday. Stated she uses it usually monthly. States she had pressure in her chest and took a nitro. Discomfort relieved.  Pt. Hasn't stopped plavix or asa. Dr. [name] notified. Stated to continue to get pt. Ready for surgery.   Notified Dr. Orene Desanctis, (stated to get ekg)  and Dr. [name] of pt's chest pain yesterday.

## 2014-01-19 NOTE — H&P (View-Only) (Signed)
[facility] [place] HISTORY AND PHYSICAL    History of Present Illness:  Patient is a 63 y.o. year old female who presents for evaluation of asymptomatic left internal carotid artery stenosis. The patient denies any recent symptoms of TIA amaurosis or stroke. She states she did have a stroke 8 years ago and was treated at [facility]. She does not recall what symptoms she had at that time.    She states that she has previously had a coronary stent placed in [facility]. She also complains of pain in her right leg with walking.   She previously has had angioplasty of her right peroneal artery by Dr. [name].  She had relief of her leg symptoms on the right side for approximately one month but states they are now worse again. She also complains of pain in her right foot. Some of the symptoms may be related to peripheral arterial disease but some also sound like degenerative joint changes. Other medical problems include diabetes, hypertension, elevated cholesterol.  These are all currently stable.  She is a former smoker but quit several years ago.  She is on Plavix and aspirin.    Past Medical History   Diagnosis  Date   .  Hypertension     .  Coronary artery disease     .  Angina     .  Depression     .  Claudication     .  PAD (peripheral artery disease)     .  High cholesterol     .  Myocardial infarction  1990's       "1"   .  Type II diabetes mellitus     .  GERD (gastroesophageal reflux disease)     .  Stroke         "mini stroke 1st then regular stroke", denies residual on 07/20/2013   .  Arthritis         "knees" (07/20/2013)   .  Anxiety         Past Surgical History   Procedure  Laterality  Date   .  Abdominal hysterectomy       .  Tubal ligation       .  Esophagogastroduodenoscopy  N/A  10/13/2012       Procedure: ESOPHAGOGASTRODUODENOSCOPY (EGD);  Surgeon: Gatha Mayer, MD;  Location[facility] ENDOSCOPY;  Service: Endoscopy;  Laterality: N/A;   .  Lower  extremity angiogram    07/20/2013       Unsuccessful attempt at crossing the CTO/notes 07/20/2013   .  Coronary angioplasty with stent placement           "1"   .  Balloon angioplasty, artery    10/05/2013       DR [name]   .  Atherectomy    10/05/2013     Social History History   Substance Use Topics   .  Smoking status:  Former Smoker -- 0.50 packs/day for 4 years       Types:  Cigarettes       Quit date:  05/30/2009   .  Smokeless tobacco:  Never Used   .  Alcohol Use:  No     Family History No family history on file.  Allergies  No Known Allergies     Current Outpatient Prescriptions   Medication  Sig  Dispense  Refill   .  ALPRAZolam (XANAX) 0.25 MG tablet  Take 0.25 mg by mouth  daily as needed for anxiety.       Marland Kitchen  aspirin EC 81 MG tablet  Take 81 mg by mouth daily.       .  cilostazol (PLETAL) 100 MG tablet  Take 100 mg by mouth 2 (two) times daily.        .  clopidogrel (PLAVIX) 75 MG tablet  Take 1 tablet (75 mg total) by mouth daily.  30 tablet  3   .  HYDROcodone-acetaminophen (NORCO/VICODIN) 5-325 MG per tablet  Take 1 tablet by mouth every 6 (six) hours as needed for moderate pain.        Marland Kitchen  lisinopril-hydrochlorothiazide (PRINZIDE,ZESTORETIC) 20-12.5 MG per tablet  Take 1 tablet by mouth 2 (two) times daily.        .  meloxicam (MOBIC) 7.5 MG tablet  Take 7.5 mg by mouth daily as needed for pain.        .  metFORMIN (GLUCOPHAGE) 500 MG tablet  Take 1 tablet (500 mg total) by mouth daily with breakfast.       .  methocarbamol (ROBAXIN) 500 MG tablet  Take 500 mg by mouth every 6 (six) hours as needed for muscle spasms.        Marland Kitchen  omeprazole (PRILOSEC) 20 MG capsule  Take 20 mg by mouth daily.       .  pravastatin (PRAVACHOL) 40 MG tablet  Take 40 mg by mouth daily.       Marland Kitchen  amLODipine (NORVASC) 10 MG tablet  Take 10 mg by mouth daily.       .  nitroGLYCERIN (NITROSTAT) 0.4 MG SL tablet  Place 0.4 mg under the tongue every 5 (five) minutes as needed. For chest pain        .  traMADol (ULTRAM) 50 MG tablet  Take 1 tablet (50 mg total) by mouth every 6 (six) hours as needed for moderate pain.  60 tablet  0      No current facility-administered medications for this visit.     ROS:    General:  No weight loss, Fever, chills  HEENT: No recent headaches, no nasal bleeding, no visual changes, no sore throat  Neurologic: No dizziness, blackouts, seizures. No recent symptoms of stroke or mini- stroke. No recent episodes of slurred speech, or temporary blindness.  Cardiac: + recent episodes of chest pain/pressure, no shortness of breath at rest.  No shortness of breath with exertion.  Denies history of atrial fibrillation or irregular heartbeat  Vascular: No history of rest pain in feet.  +  history of claudication.  No history of non-healing ulcer, No history of DVT    Pulmonary: No home oxygen, no productive cough, no hemoptysis,  No asthma or wheezing  Musculoskeletal:  [ ]  Arthritis, [ ]  Low back pain,  [ x] Joint pain  Hematologic:No history of hypercoagulable state.  No history of easy bleeding.  No history of anemia  Gastrointestinal: No hematochezia or melena,  No gastroesophageal reflux, no trouble swallowing  Urinary: [ ]  chronic Kidney disease, [ ]  on HD - [ ]  MWF or [ ]  TTHS, [ ]  Burning with urination, [ ]  Frequent urination, [ ]  Difficulty urinating;    Skin: No rashes  Psychological: No history of anxiety,  No history of depression   Physical Examination    Filed Vitals:   01/13/14 1542 01/13/14 1546  BP: 159/61 148/65  Pulse: 83 85  Resp: 14   Height: 4\' 1"  (1.245 m)  Weight: 124 lb (56.246 kg)     General:  Alert and oriented, no acute distress HEENT: Normal Neck: right side soft carotid bruit no left carotid bruit Pulmonary: Clear to auscultation bilaterally Cardiac: Regular Rate and Rhythm with grade 2 to 3/6 systolic murmur heard throughout precordium Abdomen: Soft, non-tender, non-distended, no mass Skin: No  rash Extremity Pulses:  2+ radial, brachial, femoral,absent dorsalis pedis, posterior tibial pulses bilaterally Musculoskeletal: No deformity or edema     Neurologic: Upper and lower extremity motor 5/5 and symmetric  DATA:  Carotid duplex scan dated October 30 from [facility] is reviewed today. This shows a high-grade greater than 80% stenosis of the left internal carotid artery with velocities of 365/122. This showed a moderate right internal carotid artery stenosis. There was antegrade vertebral flow. She had a repeat carotid duplex scan in our office a few weeks ago. However this only showed a 40-60% stenosis with much lower velocities although potentially underestimated due to calcific plaque.  She had a CT angiogram of her neck today. I reviewed these images This shows a high-grade calcified stenosis of the left carotid bifurcation with a string sign as well as a moderate to high-grade stenosis of the right carotid bifurcation probably 60-80%.   ASSESSMENT:  #1. Left internal carotid artery stenosis, asymptomatic. Greater than 80% by CT angiogram. She needs left carotid endarterectomy. We will stop her Plavix and Pletal today. She will continue her aspirin. Left carotid endarterectomy scheduled for December 9. Risks benefits possible complications and procedure details were trying to the patient today including but not limited to bleeding infection stroke risk of 1-2% cranial nerve injury risk of 10-15%. She understands and agrees to proceed.   #2 right neck and chest pain. I reassured the patient that carotid disease is not cause neck pain.  She was recently seen by Dr. Colan Neptune to be reasonable from a cardiac standpoint for carotid endarterectomy.                               #3 right leg pain most likely multifactorial.I would not consider any intervention for claudication symptoms alone for tibial disease whether this be with angioplasty stenting or operation. I believe that the  risk of recurrence is high and believe the benefit is low. I advised her that if she has further questions regarding the angioplasty the Dr. [name] previously performed on her she could ask at her appointment.   PLAN:  left carotid endarterectomy December 9. Stop Plavix and Pletal today  Ruta Hinds, MD [facility] [place] Office: [phone] Pager: [phone]

## 2014-01-19 NOTE — Interval H&P Note (Signed)
History and Physical Interval Note:  01/19/2014 8:36 AM  Pt was told to stop taking her Plavix prior to CEA today but was confused and continued to take Clopidogrel (Generic Plavix) which unknown to her was the same medication.  She complained of some right side chest pain yesterday but this is chronic and has been fully evaluated.    However, since she is on Plavix(Clopidogrel) combined with aspirin, I believe her risk of bleeding is too high for an elective non urgent carotid endarterectomy.  We will cancel this today and reschedule for the next few weeks.  Plan discussed with pt and daughter  Ruta Hinds, MD [facility] [place] Office: [phone] Pager: [phone]

## 2014-01-20 ENCOUNTER — Encounter (HOSPITAL_COMMUNITY): Payer: Self-pay | Admitting: Cardiology

## 2014-01-31 ENCOUNTER — Other Ambulatory Visit: Payer: Self-pay

## 2014-02-02 ENCOUNTER — Encounter (HOSPITAL_COMMUNITY): Payer: Self-pay

## 2014-02-02 ENCOUNTER — Other Ambulatory Visit (HOSPITAL_COMMUNITY): Payer: Self-pay | Admitting: *Deleted

## 2014-02-02 ENCOUNTER — Encounter (HOSPITAL_COMMUNITY)
Admission: RE | Admit: 2014-02-02 | Discharge: 2014-02-02 | Disposition: A | Discharge status: Home / Self Care | Payer: Medicare HMO | Source: Ambulatory Visit | Attending: Vascular Surgery | Admitting: Vascular Surgery

## 2014-02-02 DIAGNOSIS — Z01812 Encounter for preprocedural laboratory examination: Secondary | ICD-10-CM | POA: Diagnosis present

## 2014-02-02 LAB — URINALYSIS, ROUTINE W REFLEX MICROSCOPIC
Bilirubin Urine: NEGATIVE
GLUCOSE, UA: NEGATIVE mg/dL
Ketones, ur: NEGATIVE mg/dL
LEUKOCYTES UA: NEGATIVE
Nitrite: NEGATIVE
PH: 5 (ref 5.0–8.0)
PROTEIN: NEGATIVE mg/dL
SPECIFIC GRAVITY, URINE: 1.02 (ref 1.005–1.030)
Urobilinogen, UA: 0.2 mg/dL (ref 0.0–1.0)

## 2014-02-02 LAB — URINE MICROSCOPIC-ADD ON

## 2014-02-02 LAB — CBC
HEMATOCRIT: 31.9 % — AB (ref 36.0–46.0)
HEMOGLOBIN: 10.1 g/dL — AB (ref 12.0–15.0)
MCH: 24.3 pg — ABNORMAL LOW (ref 26.0–34.0)
MCHC: 31.7 g/dL (ref 30.0–36.0)
MCV: 76.7 fL — ABNORMAL LOW (ref 78.0–100.0)
Platelets: 268 10*3/uL (ref 150–400)
RBC: 4.16 MIL/uL (ref 3.87–5.11)
RDW: 16.2 % — ABNORMAL HIGH (ref 11.5–15.5)
WBC: 6.2 10*3/uL (ref 4.0–10.5)

## 2014-02-02 LAB — COMPREHENSIVE METABOLIC PANEL
ALK PHOS: 53 U/L (ref 39–117)
ALT: 13 U/L (ref 0–35)
ANION GAP: 9 (ref 5–15)
AST: 21 U/L (ref 0–37)
Albumin: 4.3 g/dL (ref 3.5–5.2)
BUN: 16 mg/dL (ref 6–23)
CO2: 27 mmol/L (ref 19–32)
Calcium: 10.2 mg/dL (ref 8.4–10.5)
Chloride: 107 mEq/L (ref 96–112)
Creatinine, Ser: 1.07 mg/dL (ref 0.50–1.10)
GFR, EST AFRICAN AMERICAN: 63 mL/min — AB (ref >90)
GFR, EST NON AFRICAN AMERICAN: 54 mL/min — AB (ref >90)
GLUCOSE: 105 mg/dL — AB (ref 70–99)
POTASSIUM: 3.8 mmol/L (ref 3.5–5.1)
SODIUM: 143 mmol/L (ref 135–145)
Total Bilirubin: 0.4 mg/dL (ref 0.3–1.2)
Total Protein: 7 g/dL (ref 6.0–8.3)

## 2014-02-02 LAB — TYPE AND SCREEN
ABO/RH(D): O POS
Antibody Screen: NEGATIVE

## 2014-02-02 LAB — SURGICAL PCR SCREEN
MRSA, PCR: NEGATIVE
Staphylococcus aureus: POSITIVE — AB

## 2014-02-02 LAB — PROTIME-INR
INR: 1 (ref 0.00–1.49)
Prothrombin Time: 13.3 seconds (ref 11.6–15.2)

## 2014-02-02 LAB — APTT: aPTT: 31 seconds (ref 24–37)

## 2014-02-02 NOTE — Pre-Procedure Instructions (Signed)
Joanne Morris  02/02/2014   Your procedure is scheduled on:  Monday, February 07, 2014 at 7:30 AM.   Report to [facility] Entrance "A" Admitting Office at 5:30 AM.   Call this number if you have problems the morning of surgery: [phone]    Remember:   Do not eat food or drink liquids after midnight Sunday, 02/06/14.   Take these medicines the morning of surgery with A SIP OF WATER: amLODipine (NORVASC), omeprazole (PRILOSEC), nitroGLYCERIN (NITROSTAT) - if needed, ALPRAZolam Joanne Morris) - if needed, You may take one of your pain medications if needed.  Stop clopidogrel (PLAVIX) and cilostazol (PLETAL) as of today. Also stop Mobic (Meloxicam) as of today. Do not take any NSAIDS (Aleve, Ibuprofen, etc) prior to surgery.  Do not take your diabetic medications the morning of surgery.   Do not wear jewelry, make-up or nail polish.  Do not wear lotions, powders, or perfumes. You may wear deodorant.  Do not shave 48 hours prior to surgery.   Do not bring valuables to the hospital.  [facility] is not responsible                  for any belongings or valuables.               Contacts, dentures or bridgework may not be worn into surgery.  Leave suitcase in the car. After surgery it may be brought to your room.  For patients admitted to the hospital, discharge time is determined by your                treatment team.              Special Instructions: [place] - Preparing for Surgery  Before surgery, you can play an important role.  Because skin is not sterile, your skin needs to be as free of germs as possible.  You can reduce the number of germs on you skin by washing with CHG (chlorahexidine gluconate) soap before surgery.  CHG is an antiseptic cleaner which kills germs and bonds with the skin to continue killing germs even after washing.  Please DO NOT use if you have an allergy to CHG or antibacterial soaps.  If your skin becomes reddened/irritated stop using the CHG and  inform your nurse when you arrive at Short Stay.  Do not shave (including legs and underarms) for at least 48 hours prior to the first CHG shower.  You may shave your face.  Please follow these instructions carefully:   1.  Shower with CHG Soap the night before surgery and the                                morning of Surgery.  2.  If you choose to wash your hair, wash your hair first as usual with your       normal shampoo.  3.  After you shampoo, rinse your hair and body thoroughly to remove the                      Shampoo.  4.  Use CHG as you would any other liquid soap.  You can apply chg directly       to the skin and wash gently with scrungie or a clean washcloth.  5.  Apply the CHG Soap to your body ONLY FROM THE NECK DOWN.  Do not use on open wounds or open sores.  Avoid contact with your eyes, ears, mouth and genitals (private parts).  Wash genitals (private parts) with your normal soap.  6.  Wash thoroughly, paying special attention to the area where your surgery        will be performed.  7.  Thoroughly rinse your body with warm water from the neck down.  8.  DO NOT shower/wash with your normal soap after using and rinsing off       the CHG Soap.  9.  Pat yourself dry with a clean towel.            10.  Wear clean pajamas.            11.  Place clean sheets on your bed the night of your first shower and do not        sleep with pets.  Day of Surgery  Do not apply any lotions the morning of surgery.  Please wear clean clothes to the hospital.     Please read over the following fact sheets that you were given: Pain Booklet, Coughing and Deep Breathing, Blood Transfusion Information, MRSA Information and Surgical Site Infection Prevention

## 2014-02-02 NOTE — Progress Notes (Signed)
Primary - dr. Jeanie Cooks Cardiologist- dr. [name]

## 2014-02-06 MED ORDER — CEFUROXIME SODIUM 1.5 G IJ SOLR
1.5000 g | INTRAMUSCULAR | Status: AC
  Administered 2014-02-07: 1.5 g via INTRAVENOUS
  Filled 2014-02-06: qty 1.5

## 2014-02-06 MED ORDER — SODIUM CHLORIDE 0.9 % IV SOLN: INTRAVENOUS | Status: DC

## 2014-02-07 ENCOUNTER — Encounter (HOSPITAL_COMMUNITY): Payer: Self-pay | Admitting: *Deleted

## 2014-02-07 ENCOUNTER — Inpatient Hospital Stay (HOSPITAL_COMMUNITY)
Admission: RE | Admit: 2014-02-07 | Discharge: 2014-02-08 | DRG: 039 | Disposition: A | Discharge status: Home / Self Care | Payer: Medicare HMO | Source: Ambulatory Visit | Attending: Vascular Surgery | Admitting: Vascular Surgery

## 2014-02-07 ENCOUNTER — Inpatient Hospital Stay (HOSPITAL_COMMUNITY): Payer: Medicare HMO | Admitting: Anesthesiology

## 2014-02-07 ENCOUNTER — Telehealth: Payer: Self-pay | Admitting: Vascular Surgery

## 2014-02-07 ENCOUNTER — Encounter (HOSPITAL_COMMUNITY)
Admission: RE | Disposition: A | Discharge status: Home / Self Care | Payer: Self-pay | Source: Ambulatory Visit | Attending: Vascular Surgery

## 2014-02-07 DIAGNOSIS — Z90710 Acquired absence of both cervix and uterus: Secondary | ICD-10-CM

## 2014-02-07 DIAGNOSIS — F419 Anxiety disorder, unspecified: Secondary | ICD-10-CM | POA: Diagnosis present

## 2014-02-07 DIAGNOSIS — I739 Peripheral vascular disease, unspecified: Secondary | ICD-10-CM | POA: Diagnosis present

## 2014-02-07 DIAGNOSIS — I6529 Occlusion and stenosis of unspecified carotid artery: Secondary | ICD-10-CM | POA: Diagnosis present

## 2014-02-07 DIAGNOSIS — E780 Pure hypercholesterolemia: Secondary | ICD-10-CM | POA: Diagnosis present

## 2014-02-07 DIAGNOSIS — I6522 Occlusion and stenosis of left carotid artery: Secondary | ICD-10-CM | POA: Diagnosis present

## 2014-02-07 DIAGNOSIS — Z8673 Personal history of transient ischemic attack (TIA), and cerebral infarction without residual deficits: Secondary | ICD-10-CM | POA: Diagnosis not present

## 2014-02-07 DIAGNOSIS — F329 Major depressive disorder, single episode, unspecified: Secondary | ICD-10-CM | POA: Diagnosis present

## 2014-02-07 DIAGNOSIS — Z955 Presence of coronary angioplasty implant and graft: Secondary | ICD-10-CM

## 2014-02-07 DIAGNOSIS — I10 Essential (primary) hypertension: Secondary | ICD-10-CM | POA: Diagnosis present

## 2014-02-07 DIAGNOSIS — I2510 Atherosclerotic heart disease of native coronary artery without angina pectoris: Secondary | ICD-10-CM | POA: Diagnosis present

## 2014-02-07 DIAGNOSIS — E119 Type 2 diabetes mellitus without complications: Secondary | ICD-10-CM | POA: Diagnosis present

## 2014-02-07 DIAGNOSIS — I252 Old myocardial infarction: Secondary | ICD-10-CM

## 2014-02-07 DIAGNOSIS — M1990 Unspecified osteoarthritis, unspecified site: Secondary | ICD-10-CM | POA: Diagnosis present

## 2014-02-07 DIAGNOSIS — Z87891 Personal history of nicotine dependence: Secondary | ICD-10-CM

## 2014-02-07 DIAGNOSIS — K219 Gastro-esophageal reflux disease without esophagitis: Secondary | ICD-10-CM | POA: Diagnosis present

## 2014-02-07 HISTORY — PX: ENDARTERECTOMY: SHX5162

## 2014-02-07 HISTORY — PX: PATCH ANGIOPLASTY: SHX6230

## 2014-02-07 LAB — GLUCOSE, CAPILLARY
GLUCOSE-CAPILLARY: 128 mg/dL — AB (ref 70–99)
Glucose-Capillary: 100 mg/dL — ABNORMAL HIGH (ref 70–99)
Glucose-Capillary: 155 mg/dL — ABNORMAL HIGH (ref 70–99)

## 2014-02-07 SURGERY — ENDARTERECTOMY, CAROTID
Anesthesia: General | Site: Neck | Laterality: Left

## 2014-02-07 MED ORDER — DEXTROSE 5 % IV SOLN
1.5000 g | Freq: Two times a day (BID) | INTRAVENOUS | Status: AC
  Administered 2014-02-07 – 2014-02-08 (×2): 1.5 g via INTRAVENOUS
  Filled 2014-02-07 (×2): qty 1.5

## 2014-02-07 MED ORDER — ACETAMINOPHEN 650 MG RE SUPP: 325.0000 mg | RECTAL | Status: DC | PRN

## 2014-02-07 MED ORDER — ONDANSETRON HCL 4 MG/2ML IJ SOLN
INTRAMUSCULAR | Status: AC
  Filled 2014-02-07: qty 2

## 2014-02-07 MED ORDER — LIDOCAINE HCL (CARDIAC) 20 MG/ML IV SOLN
INTRAVENOUS | Status: AC
  Filled 2014-02-07: qty 5

## 2014-02-07 MED ORDER — DOCUSATE SODIUM 100 MG PO CAPS
100.0000 mg | ORAL_CAPSULE | Freq: Every day | ORAL | Status: DC
  Administered 2014-02-08: 100 mg via ORAL
  Filled 2014-02-07: qty 1

## 2014-02-07 MED ORDER — MAGNESIUM SULFATE 2 GM/50ML IV SOLN: 2.0000 g | Freq: Every day | INTRAVENOUS | Status: DC | PRN

## 2014-02-07 MED ORDER — SODIUM CHLORIDE 0.9 % IJ SOLN
INTRAMUSCULAR | Status: AC
  Filled 2014-02-07: qty 10

## 2014-02-07 MED ORDER — NITROGLYCERIN 0.2 MG/ML ON CALL CATH LAB
INTRAVENOUS | Status: DC | PRN
  Administered 2014-02-07 (×5): 20 ug via INTRAVENOUS

## 2014-02-07 MED ORDER — LABETALOL HCL 5 MG/ML IV SOLN
INTRAVENOUS | Status: AC
  Filled 2014-02-07: qty 4

## 2014-02-07 MED ORDER — LACTATED RINGERS IV SOLN
INTRAVENOUS | Status: DC | PRN
  Administered 2014-02-07 (×2): via INTRAVENOUS

## 2014-02-07 MED ORDER — ROCURONIUM BROMIDE 50 MG/5ML IV SOLN
INTRAVENOUS | Status: AC
  Filled 2014-02-07: qty 1

## 2014-02-07 MED ORDER — INSULIN ASPART 100 UNIT/ML ~~LOC~~ SOLN
0.0000 [IU] | Freq: Three times a day (TID) | SUBCUTANEOUS | Status: DC
  Administered 2014-02-08: 1 [IU] via SUBCUTANEOUS

## 2014-02-07 MED ORDER — PROPOFOL 10 MG/ML IV BOLUS
INTRAVENOUS | Status: AC
  Filled 2014-02-07: qty 20

## 2014-02-07 MED ORDER — ENOXAPARIN SODIUM 40 MG/0.4ML ~~LOC~~ SOLN
40.0000 mg | SUBCUTANEOUS | Status: DC
  Administered 2014-02-08: 40 mg via SUBCUTANEOUS
  Filled 2014-02-07: qty 0.4

## 2014-02-07 MED ORDER — GLYCOPYRROLATE 0.2 MG/ML IJ SOLN
INTRAMUSCULAR | Status: DC | PRN
  Administered 2014-02-07: 0.2 mg via INTRAVENOUS
  Administered 2014-02-07: 0.6 mg via INTRAVENOUS

## 2014-02-07 MED ORDER — DOPAMINE-DEXTROSE 3.2-5 MG/ML-% IV SOLN: 3.0000 ug/kg/min | INTRAVENOUS | Status: DC | PRN

## 2014-02-07 MED ORDER — SUCCINYLCHOLINE CHLORIDE 20 MG/ML IJ SOLN
INTRAMUSCULAR | Status: AC
  Filled 2014-02-07: qty 1

## 2014-02-07 MED ORDER — NEOSTIGMINE METHYLSULFATE 10 MG/10ML IV SOLN
INTRAVENOUS | Status: DC | PRN
  Administered 2014-02-07: 5 mg via INTRAVENOUS

## 2014-02-07 MED ORDER — ASPIRIN EC 81 MG PO TBEC
81.0000 mg | DELAYED_RELEASE_TABLET | Freq: Every day | ORAL | Status: DC
Start: 2014-02-07 — End: 2014-02-08
  Administered 2014-02-07 – 2014-02-08 (×2): 81 mg via ORAL
  Filled 2014-02-07 (×2): qty 1

## 2014-02-07 MED ORDER — LISINOPRIL-HYDROCHLOROTHIAZIDE 20-12.5 MG PO TABS: 1.0000 | ORAL_TABLET | Freq: Two times a day (BID) | ORAL | Status: DC

## 2014-02-07 MED ORDER — GLYCOPYRROLATE 0.2 MG/ML IJ SOLN
INTRAMUSCULAR | Status: AC
  Filled 2014-02-07: qty 1

## 2014-02-07 MED ORDER — MORPHINE SULFATE 2 MG/ML IJ SOLN
INTRAMUSCULAR | Status: AC
  Administered 2014-02-07: 2 mg via INTRAVENOUS
  Filled 2014-02-07: qty 1

## 2014-02-07 MED ORDER — METOPROLOL TARTRATE 1 MG/ML IV SOLN: 2.0000 mg | INTRAVENOUS | Status: DC | PRN

## 2014-02-07 MED ORDER — PROTAMINE SULFATE 10 MG/ML IV SOLN
INTRAVENOUS | Status: DC | PRN
  Administered 2014-02-07: 10 mg via INTRAVENOUS
  Administered 2014-02-07 (×3): 20 mg via INTRAVENOUS

## 2014-02-07 MED ORDER — LISINOPRIL 20 MG PO TABS
20.0000 mg | ORAL_TABLET | Freq: Two times a day (BID) | ORAL | Status: DC
  Administered 2014-02-07 – 2014-02-08 (×2): 20 mg via ORAL
  Filled 2014-02-07 (×3): qty 1

## 2014-02-07 MED ORDER — FENTANYL CITRATE 0.05 MG/ML IJ SOLN
INTRAMUSCULAR | Status: AC
  Administered 2014-02-07: 50 ug via INTRAVENOUS
  Filled 2014-02-07: qty 2

## 2014-02-07 MED ORDER — PHENOL 1.4 % MT LIQD: 1.0000 | OROMUCOSAL | Status: DC | PRN

## 2014-02-07 MED ORDER — MORPHINE SULFATE 2 MG/ML IJ SOLN
2.0000 mg | INTRAMUSCULAR | Status: DC | PRN
  Administered 2014-02-07 (×3): 2 mg via INTRAVENOUS

## 2014-02-07 MED ORDER — SODIUM CHLORIDE 0.9 % IR SOLN
Status: DC | PRN
  Administered 2014-02-07: 08:00:00

## 2014-02-07 MED ORDER — HEPARIN SODIUM (PORCINE) 1000 UNIT/ML IJ SOLN
INTRAMUSCULAR | Status: DC | PRN
  Administered 2014-02-07: 7000 [IU] via INTRAVENOUS

## 2014-02-07 MED ORDER — SODIUM CHLORIDE 0.9 % IV SOLN: 500.0000 mL | Freq: Once | INTRAVENOUS | Status: AC | PRN

## 2014-02-07 MED ORDER — TRAMADOL HCL 50 MG PO TABS
50.0000 mg | ORAL_TABLET | Freq: Four times a day (QID) | ORAL | Status: DC | PRN
  Administered 2014-02-07 – 2014-02-08 (×2): 50 mg via ORAL
  Filled 2014-02-07 (×2): qty 1

## 2014-02-07 MED ORDER — PRAVASTATIN SODIUM 40 MG PO TABS
40.0000 mg | ORAL_TABLET | Freq: Every day | ORAL | Status: DC
  Administered 2014-02-07 – 2014-02-08 (×2): 40 mg via ORAL
  Filled 2014-02-07 (×2): qty 1

## 2014-02-07 MED ORDER — LABETALOL HCL 5 MG/ML IV SOLN: 10.0000 mg | INTRAVENOUS | Status: DC | PRN

## 2014-02-07 MED ORDER — FENTANYL CITRATE 0.05 MG/ML IJ SOLN
INTRAMUSCULAR | Status: DC | PRN
  Administered 2014-02-07: 150 ug via INTRAVENOUS
  Administered 2014-02-07 (×2): 50 ug via INTRAVENOUS

## 2014-02-07 MED ORDER — ACETAMINOPHEN 325 MG PO TABS
325.0000 mg | ORAL_TABLET | ORAL | Status: DC | PRN
  Administered 2014-02-07: 650 mg via ORAL
  Filled 2014-02-07: qty 2

## 2014-02-07 MED ORDER — FENTANYL CITRATE 0.05 MG/ML IJ SOLN
INTRAMUSCULAR | Status: AC
  Filled 2014-02-07: qty 5

## 2014-02-07 MED ORDER — NIACIN ER (ANTIHYPERLIPIDEMIC) 1000 MG PO TBCR: 1000.0000 mg | EXTENDED_RELEASE_TABLET | Freq: Every day | ORAL | Status: DC

## 2014-02-07 MED ORDER — FENTANYL CITRATE 0.05 MG/ML IJ SOLN
25.0000 ug | INTRAMUSCULAR | Status: DC | PRN
  Administered 2014-02-07 (×2): 50 ug via INTRAVENOUS

## 2014-02-07 MED ORDER — POTASSIUM CHLORIDE IN NACL 20-0.9 MEQ/L-% IV SOLN
INTRAVENOUS | Status: DC
  Administered 2014-02-07: 21:00:00 via INTRAVENOUS
  Filled 2014-02-07 (×3): qty 1000

## 2014-02-07 MED ORDER — LIDOCAINE HCL (CARDIAC) 20 MG/ML IV SOLN
INTRAVENOUS | Status: DC | PRN
  Administered 2014-02-07: 30 mg via INTRAVENOUS
  Administered 2014-02-07: 100 mg via INTRATRACHEAL

## 2014-02-07 MED ORDER — CHLORHEXIDINE GLUCONATE CLOTH 2 % EX PADS: 6.0000 | MEDICATED_PAD | Freq: Once | CUTANEOUS | Status: DC

## 2014-02-07 MED ORDER — PHENYLEPHRINE 40 MCG/ML (10ML) SYRINGE FOR IV PUSH (FOR BLOOD PRESSURE SUPPORT)
PREFILLED_SYRINGE | INTRAVENOUS | Status: AC
  Filled 2014-02-07: qty 10

## 2014-02-07 MED ORDER — PANTOPRAZOLE SODIUM 40 MG PO TBEC
40.0000 mg | DELAYED_RELEASE_TABLET | Freq: Every day | ORAL | Status: DC
  Administered 2014-02-08: 40 mg via ORAL
  Filled 2014-02-07: qty 1

## 2014-02-07 MED ORDER — ONDANSETRON HCL 4 MG/2ML IJ SOLN: 4.0000 mg | Freq: Four times a day (QID) | INTRAMUSCULAR | Status: DC | PRN

## 2014-02-07 MED ORDER — MORPHINE SULFATE 2 MG/ML IJ SOLN
INTRAMUSCULAR | Status: AC
  Filled 2014-02-07: qty 1

## 2014-02-07 MED ORDER — 0.9 % SODIUM CHLORIDE (POUR BTL) OPTIME
TOPICAL | Status: DC | PRN
  Administered 2014-02-07: 2000 mL

## 2014-02-07 MED ORDER — CLOPIDOGREL BISULFATE 75 MG PO TABS
75.0000 mg | ORAL_TABLET | Freq: Every day | ORAL | Status: DC
Start: 2014-02-08 — End: 2014-02-08
  Administered 2014-02-08: 75 mg via ORAL
  Filled 2014-02-07: qty 1

## 2014-02-07 MED ORDER — LABETALOL HCL 5 MG/ML IV SOLN
INTRAVENOUS | Status: DC | PRN
  Administered 2014-02-07 (×2): 5 mg via INTRAVENOUS
  Administered 2014-02-07: 10 mg via INTRAVENOUS
  Administered 2014-02-07 (×4): 5 mg via INTRAVENOUS

## 2014-02-07 MED ORDER — HYDROCHLOROTHIAZIDE 12.5 MG PO CAPS
12.5000 mg | ORAL_CAPSULE | Freq: Two times a day (BID) | ORAL | Status: DC
  Administered 2014-02-07 – 2014-02-08 (×2): 12.5 mg via ORAL
  Filled 2014-02-07 (×3): qty 1

## 2014-02-07 MED ORDER — ZOLPIDEM TARTRATE 5 MG PO TABS
5.0000 mg | ORAL_TABLET | Freq: Every evening | ORAL | Status: DC | PRN
  Administered 2014-02-07: 5 mg via ORAL
  Filled 2014-02-07: qty 1

## 2014-02-07 MED ORDER — ROCURONIUM BROMIDE 100 MG/10ML IV SOLN
INTRAVENOUS | Status: DC | PRN
  Administered 2014-02-07: 10 mg via INTRAVENOUS
  Administered 2014-02-07: 40 mg via INTRAVENOUS

## 2014-02-07 MED ORDER — PROPOFOL 10 MG/ML IV BOLUS
INTRAVENOUS | Status: DC | PRN
  Administered 2014-02-07: 160 mg via INTRAVENOUS

## 2014-02-07 MED ORDER — NEOSTIGMINE METHYLSULFATE 10 MG/10ML IV SOLN
INTRAVENOUS | Status: AC
  Filled 2014-02-07: qty 1

## 2014-02-07 MED ORDER — BISACODYL 10 MG RE SUPP: 10.0000 mg | Freq: Every day | RECTAL | Status: DC | PRN

## 2014-02-07 MED ORDER — NIACIN ER 500 MG PO CPCR
1000.0000 mg | ORAL_CAPSULE | Freq: Every day | ORAL | Status: DC
  Administered 2014-02-07 – 2014-02-08 (×2): 1000 mg via ORAL
  Filled 2014-02-07 (×2): qty 2

## 2014-02-07 MED ORDER — EPHEDRINE SULFATE 50 MG/ML IJ SOLN
INTRAMUSCULAR | Status: AC
  Filled 2014-02-07: qty 1

## 2014-02-07 MED ORDER — ALUM & MAG HYDROXIDE-SIMETH 200-200-20 MG/5ML PO SUSP: 15.0000 mL | ORAL | Status: DC | PRN

## 2014-02-07 MED ORDER — GUAIFENESIN-DM 100-10 MG/5ML PO SYRP: 15.0000 mL | ORAL_SOLUTION | ORAL | Status: DC | PRN

## 2014-02-07 MED ORDER — THROMBIN 20000 UNITS EX SOLR
CUTANEOUS | Status: AC
  Filled 2014-02-07: qty 20000

## 2014-02-07 MED ORDER — POTASSIUM CHLORIDE CRYS ER 20 MEQ PO TBCR
20.0000 meq | EXTENDED_RELEASE_TABLET | Freq: Every day | ORAL | Status: AC | PRN
  Administered 2014-02-08: 20 meq via ORAL
  Filled 2014-02-07: qty 1

## 2014-02-07 MED ORDER — ALPRAZOLAM 0.25 MG PO TABS
0.2500 mg | ORAL_TABLET | Freq: Every day | ORAL | Status: DC | PRN
  Administered 2014-02-07: 0.25 mg via ORAL
  Filled 2014-02-07: qty 1

## 2014-02-07 MED ORDER — METFORMIN HCL 500 MG PO TABS
500.0000 mg | ORAL_TABLET | Freq: Every day | ORAL | Status: DC
  Administered 2014-02-08: 500 mg via ORAL
  Filled 2014-02-07 (×2): qty 1

## 2014-02-07 MED ORDER — LIDOCAINE HCL (PF) 1 % IJ SOLN
INTRAMUSCULAR | Status: AC
  Filled 2014-02-07: qty 30

## 2014-02-07 MED ORDER — DEXTROSE 5 % IV SOLN
10.0000 mg | INTRAVENOUS | Status: DC | PRN
  Administered 2014-02-07: 30 ug/min via INTRAVENOUS

## 2014-02-07 MED ORDER — PROTAMINE SULFATE 10 MG/ML IV SOLN
INTRAVENOUS | Status: AC
  Filled 2014-02-07: qty 10

## 2014-02-07 MED ORDER — ONDANSETRON HCL 4 MG/2ML IJ SOLN
INTRAMUSCULAR | Status: DC | PRN
  Administered 2014-02-07: 4 mg via INTRAVENOUS

## 2014-02-07 MED ORDER — AMLODIPINE BESYLATE 10 MG PO TABS
10.0000 mg | ORAL_TABLET | Freq: Every day | ORAL | Status: DC
  Administered 2014-02-08: 10 mg via ORAL
  Filled 2014-02-07: qty 1

## 2014-02-07 MED ORDER — GLYCOPYRROLATE 0.2 MG/ML IJ SOLN
INTRAMUSCULAR | Status: AC
  Filled 2014-02-07: qty 3

## 2014-02-07 MED ORDER — NITROGLYCERIN 0.4 MG SL SUBL: 0.4000 mg | SUBLINGUAL_TABLET | SUBLINGUAL | Status: DC | PRN

## 2014-02-07 MED ORDER — HYDRALAZINE HCL 20 MG/ML IJ SOLN: 5.0000 mg | INTRAMUSCULAR | Status: DC | PRN

## 2014-02-07 SURGICAL SUPPLY — 44 items
BLADE SURG 10 STRL SS (BLADE) ×1 IMPLANT
CANISTER SUCTION 2500CC (MISCELLANEOUS) ×2 IMPLANT
CANNULA VESSEL 3MM 2 BLNT TIP (CANNULA) ×3 IMPLANT
CATH ROBINSON RED A/P 18FR (CATHETERS) ×2 IMPLANT
CLIP TI MEDIUM 6 (CLIP) ×2 IMPLANT
CLIP TI WIDE RED SMALL 6 (CLIP) ×2 IMPLANT
CRADLE DONUT ADULT HEAD (MISCELLANEOUS) ×2 IMPLANT
DECANTER SPIKE VIAL GLASS SM (MISCELLANEOUS) IMPLANT
DRAIN HEMOVAC 1/8 X 5 (WOUND CARE) IMPLANT
ELECT REM PT RETURN 9FT ADLT (ELECTROSURGICAL) ×2
ELECTRODE REM PT RTRN 9FT ADLT (ELECTROSURGICAL) ×1 IMPLANT
EVACUATOR SILICONE 100CC (DRAIN) IMPLANT
GAUZE SPONGE 4X4 12PLY STRL (GAUZE/BANDAGES/DRESSINGS) ×1 IMPLANT
GEL ULTRASOUND 20GR AQUASONIC (MISCELLANEOUS) IMPLANT
GLOVE BIO SURGEON STRL SZ7.5 (GLOVE) ×5 IMPLANT
GOWN STRL REUS W/ TWL LRG LVL3 (GOWN DISPOSABLE) ×3 IMPLANT
GOWN STRL REUS W/TWL LRG LVL3 (GOWN DISPOSABLE) ×12
KIT BASIN OR (CUSTOM PROCEDURE TRAY) ×2 IMPLANT
KIT ROOM TURNOVER OR (KITS) ×2 IMPLANT
LIQUID BAND (GAUZE/BANDAGES/DRESSINGS) ×2 IMPLANT
LOOP VESSEL MINI RED (MISCELLANEOUS) IMPLANT
NDL HYPO 25GX1X1/2 BEV (NEEDLE) IMPLANT
NEEDLE HYPO 25GX1X1/2 BEV (NEEDLE) IMPLANT
NS IRRIG 1000ML POUR BTL (IV SOLUTION) ×4 IMPLANT
PACK CAROTID (CUSTOM PROCEDURE TRAY) ×2 IMPLANT
PAD ARMBOARD 7.5X6 YLW CONV (MISCELLANEOUS) ×4 IMPLANT
PATCH HEMASHIELD 8X75 (Vascular Products) ×1 IMPLANT
PROBE PENCIL 8 MHZ STRL DISP (MISCELLANEOUS) IMPLANT
SHUNT CAROTID BYPASS 10 (VASCULAR PRODUCTS) ×1 IMPLANT
SHUNT CAROTID BYPASS 12FRX15.5 (VASCULAR PRODUCTS) IMPLANT
SPONGE INTESTINAL PEANUT (DISPOSABLE) ×2 IMPLANT
SPONGE SURGIFOAM ABS GEL 100 (HEMOSTASIS) IMPLANT
SUT ETHILON 3 0 PS 1 (SUTURE) IMPLANT
SUT PROLENE 6 0 CC (SUTURE) ×4 IMPLANT
SUT PROLENE 7 0 BV 1 (SUTURE) IMPLANT
SUT SILK 3 0 (SUTURE) ×2
SUT SILK 3 0 TIES 17X18 (SUTURE)
SUT SILK 3-0 18XBRD TIE 12 (SUTURE) IMPLANT
SUT SILK 3-0 18XBRD TIE BLK (SUTURE) IMPLANT
SUT VIC AB 3-0 SH 27 (SUTURE) ×2
SUT VIC AB 3-0 SH 27X BRD (SUTURE) ×1 IMPLANT
SUT VICRYL 4-0 PS2 18IN ABS (SUTURE) ×2 IMPLANT
SYR CONTROL 10ML LL (SYRINGE) IMPLANT
WATER STERILE IRR 1000ML POUR (IV SOLUTION) ×2 IMPLANT

## 2014-02-07 NOTE — Op Note (Signed)
Procedure Left carotid endarterectomy  Preoperative diagnosis: High-grade asymptomatic left internal carotid artery stenosis  Postoperative diagnosis: Same  Anesthesia General  Asst.: Silva Bandy, [facility]  Operative findings: #1 greater than 80% left internal carotid stenosis, small internal                                                              #2 Dacron patch           #3 10 Fr shunt  Operative details: After obtaining informed consent, the patient was taken to the operating room. The patient was placed in a supine position on the operating room table. After induction of general anesthesia and endotracheal intubation a Foley catheter was placed. Next the patient's entire neck and chest was prepped and draped in the usual sterile fashion. An oblique incision was made on the left aspect of the patient's neck anterior to the border the left sternocleidomastoid muscle. The incision was carried into the subcutaneous tissues and through the platysma. The sternocleidomastoid muscle was identified and reflected laterally. The omohyoid muscle was identified and this was divided with cautery. The common carotid artery was then found at the base of the incision this was dissected free circumferentially. It was fairly soft on palpation.  The vagus nerve was identified and protected. Dissection was then carried up to the level carotid bifurcation.   The hyperglossal nerve was well above the primary area of dissection. It was identified and protected.The internal carotid artery was dissected free circumferentially just below the level of the hypoglossal nerve and it was soft in character at this location and above any palpable disease. A vessel loop was placed around this. The vessel was fairly small. Next the external carotid and superior thyroid arteries were dissected free circumferentially and vessel loops were placed around these. The patient was given 7000 units of intravenous heparin.  After 2 minutes of  circulation time and raising the mean arterial pressure to 90 mm mercury, the distal internal carotid artery was controlled with small bulldog clamp. The external carotid and superior thyroid arteries were controlled with vessel loops. The common carotid artery was controlled with a peripheral DeBakey clamp. A longitudinal opening was made in the common carotid artery just below the bifurcation. The arteriotomy was extended distally up into the internal carotid with Potts scissors. There was a large calcified plaque with greater than 80% stenosis in the internal carotid.  This was a small vessel. A 10 Fr shunt was brought onto the field and fashioned to fit the patient's artery.  This was threaded into the distal internal carotid artery and allowed to backbleed thoroughly.  There was pulsatile backbleeding.  This was then threaded into the common carotid and secured with a Rummel tourniquet. There was no air at this point and flow was restored to the brain.  Attention was then turned to the common carotid artery once again. A suitable endarterectomy plane was obtained and endarterectomy was begun in the common carotid artery and a good proximal endpoint was obtained. An eversion endarterectomy was performed on the external carotid artery and a good endpoint was obtained. The plaque was then elevated in the internal carotid artery and a nice feathered distal endpoint was also obtained.  The plaque was passed off the table. All loose debris  was then removed from the carotid bed and everything was thoroughly irrigated with heparinized saline. A Dacron patch was then brought on to the operative field and this was sewn on as a patch angioplasty using a running 6-0 Prolene suture. Prior to completion of the anastomosis the internal carotid artery was thoroughly backbled. This was then controlled again with a fine bulldog clamp.  The common carotid was thoroughly flushed forward. The external carotid was also thoroughly  backbled.  The remainder of the patch was completed and the anastomosis was secured. Flow was then restored first retrograde from the external carotid into the carotid bed then antegrade from the common carotid to the external carotid artery and after approximately 5 cardiac cycles to the internal carotid artery. Doppler was used to evaluate the external/internal and common carotid arteries and these all had good Doppler flow. Hemostasis was obtained with 1 additional repair suture. The patient was also given 70 mg of Protamine.      The platysma muscle was reapproximated using a running 3-0 Vicryl suture. The skin was closed with 4 0 Vicryl subcuticular stitch.  The patient was awakened in the operating room and was moving upper and lower extremities symmetrically and following commands.  The patient was stable on arrival to the PACU.  Ruta Hinds, MD [facility] [place] Office: [phone] Pager: [phone]

## 2014-02-07 NOTE — H&P (View-Only) (Signed)
[facility] [place] HISTORY AND PHYSICAL    History of Present Illness:  Patient is a 63 y.o. year old female who presents for evaluation of asymptomatic left internal carotid artery stenosis. The patient denies any recent symptoms of TIA amaurosis or stroke. She states she did have a stroke 8 years ago and was treated at [facility]. She does not recall what symptoms she had at that time.    She states that she has previously had a coronary stent placed in [facility]. She also complains of pain in her right leg with walking.   She previously has had angioplasty of her right peroneal artery by Dr. [name].  She had relief of her leg symptoms on the right side for approximately one month but states they are now worse again. She also complains of pain in her right foot. Some of the symptoms may be related to peripheral arterial disease but some also sound like degenerative joint changes. Other medical problems include diabetes, hypertension, elevated cholesterol.  These are all currently stable.  She is a former smoker but quit several years ago.  She is on Plavix and aspirin.    Past Medical History   Diagnosis  Date   .  Hypertension     .  Coronary artery disease     .  Angina     .  Depression     .  Claudication     .  PAD (peripheral artery disease)     .  High cholesterol     .  Myocardial infarction  1990's       "1"   .  Type II diabetes mellitus     .  GERD (gastroesophageal reflux disease)     .  Stroke         "mini stroke 1st then regular stroke", denies residual on 07/20/2013   .  Arthritis         "knees" (07/20/2013)   .  Anxiety         Past Surgical History   Procedure  Laterality  Date   .  Abdominal hysterectomy       .  Tubal ligation       .  Esophagogastroduodenoscopy  N/A  10/13/2012       Procedure: ESOPHAGOGASTRODUODENOSCOPY (EGD);  Surgeon: Gatha Mayer, MD;  Location: [facility] ENDOSCOPY;  Service: Endoscopy;  Laterality: N/A;   .  Lower  extremity angiogram    07/20/2013       Unsuccessful attempt at crossing the CTO/notes 07/20/2013   .  Coronary angioplasty with stent placement           "1"   .  Balloon angioplasty, artery    10/05/2013       DR [name]   .  Atherectomy    10/05/2013     Social History History   Substance Use Topics   .  Smoking status:  Former Smoker -- 0.50 packs/day for 4 years       Types:  Cigarettes       Quit date:  05/30/2009   .  Smokeless tobacco:  Never Used   .  Alcohol Use:  No     Family History No family history on file.  Allergies  No Known Allergies     Current Outpatient Prescriptions   Medication  Sig  Dispense  Refill   .  ALPRAZolam (XANAX) 0.25 MG tablet  Take 0.25 mg by mouth  daily as needed for anxiety.       Marland Kitchen  aspirin EC 81 MG tablet  Take 81 mg by mouth daily.       .  cilostazol (PLETAL) 100 MG tablet  Take 100 mg by mouth 2 (two) times daily.        .  clopidogrel (PLAVIX) 75 MG tablet  Take 1 tablet (75 mg total) by mouth daily.  30 tablet  3   .  HYDROcodone-acetaminophen (NORCO/VICODIN) 5-325 MG per tablet  Take 1 tablet by mouth every 6 (six) hours as needed for moderate pain.        Marland Kitchen  lisinopril-hydrochlorothiazide (PRINZIDE,ZESTORETIC) 20-12.5 MG per tablet  Take 1 tablet by mouth 2 (two) times daily.        .  meloxicam (MOBIC) 7.5 MG tablet  Take 7.5 mg by mouth daily as needed for pain.        .  metFORMIN (GLUCOPHAGE) 500 MG tablet  Take 1 tablet (500 mg total) by mouth daily with breakfast.       .  methocarbamol (ROBAXIN) 500 MG tablet  Take 500 mg by mouth every 6 (six) hours as needed for muscle spasms.        Marland Kitchen  omeprazole (PRILOSEC) 20 MG capsule  Take 20 mg by mouth daily.       .  pravastatin (PRAVACHOL) 40 MG tablet  Take 40 mg by mouth daily.       Marland Kitchen  amLODipine (NORVASC) 10 MG tablet  Take 10 mg by mouth daily.       .  nitroGLYCERIN (NITROSTAT) 0.4 MG SL tablet  Place 0.4 mg under the tongue every 5 (five) minutes as needed. For chest pain        .  traMADol (ULTRAM) 50 MG tablet  Take 1 tablet (50 mg total) by mouth every 6 (six) hours as needed for moderate pain.  60 tablet  0      No current facility-administered medications for this visit.     ROS:    General:  No weight loss, Fever, chills  HEENT: No recent headaches, no nasal bleeding, no visual changes, no sore throat  Neurologic: No dizziness, blackouts, seizures. No recent symptoms of stroke or mini- stroke. No recent episodes of slurred speech, or temporary blindness.  Cardiac: + recent episodes of chest pain/pressure, no shortness of breath at rest.  No shortness of breath with exertion.  Denies history of atrial fibrillation or irregular heartbeat  Vascular: No history of rest pain in feet.  +  history of claudication.  No history of non-healing ulcer, No history of DVT    Pulmonary: No home oxygen, no productive cough, no hemoptysis,  No asthma or wheezing  Musculoskeletal:  [ ]  Arthritis, [ ]  Low back pain,  [ x] Joint pain  Hematologic:No history of hypercoagulable state.  No history of easy bleeding.  No history of anemia  Gastrointestinal: No hematochezia or melena,  No gastroesophageal reflux, no trouble swallowing  Urinary: [ ]  chronic Kidney disease, [ ]  on HD - [ ]  MWF or [ ]  TTHS, [ ]  Burning with urination, [ ]  Frequent urination, [ ]  Difficulty urinating;    Skin: No rashes  Psychological: No history of anxiety,  No history of depression   Physical Examination    Filed Vitals:   01/13/14 1542 01/13/14 1546  BP: 159/61 148/65  Pulse: 83 85  Resp: 14   Height: 4\' 1"  (1.245 m)  Weight: 124 lb (56.246 kg)     General:  Alert and oriented, no acute distress HEENT: Normal Neck: right side soft carotid bruit no left carotid bruit Pulmonary: Clear to auscultation bilaterally Cardiac: Regular Rate and Rhythm with grade 2 to 3/6 systolic murmur heard throughout precordium Abdomen: Soft, non-tender, non-distended, no mass Skin: No  rash Extremity Pulses:  2+ radial, brachial, femoral,absent dorsalis pedis, posterior tibial pulses bilaterally Musculoskeletal: No deformity or edema     Neurologic: Upper and lower extremity motor 5/5 and symmetric  DATA:  Carotid duplex scan dated October 30 from [facility] is reviewed today. This shows a high-grade greater than 80% stenosis of the left internal carotid artery with velocities of 365/122. This showed a moderate right internal carotid artery stenosis. There was antegrade vertebral flow. She had a repeat carotid duplex scan in our office a few weeks ago. However this only showed a 40-60% stenosis with much lower velocities although potentially underestimated due to calcific plaque.  She had a CT angiogram of her neck today. I reviewed these images This shows a high-grade calcified stenosis of the left carotid bifurcation with a string sign as well as a moderate to high-grade stenosis of the right carotid bifurcation probably 60-80%.   ASSESSMENT:  #1. Left internal carotid artery stenosis, asymptomatic. Greater than 80% by CT angiogram. She needs left carotid endarterectomy. We will stop her Plavix and Pletal today. She will continue her aspirin. Left carotid endarterectomy scheduled for December 9. Risks benefits possible complications and procedure details were trying to the patient today including but not limited to bleeding infection stroke risk of 1-2% cranial nerve injury risk of 10-15%. She understands and agrees to proceed.   #2 right neck and chest pain. I reassured the patient that carotid disease is not cause neck pain.  She was recently seen by Dr. Colan Neptune to be reasonable from a cardiac standpoint for carotid endarterectomy.                               #3 right leg pain most likely multifactorial.I would not consider any intervention for claudication symptoms alone for tibial disease whether this be with angioplasty stenting or operation. I believe that the  risk of recurrence is high and believe the benefit is low. I advised her that if she has further questions regarding the angioplasty the Dr. [name] previously performed on her she could ask at her appointment.   PLAN:  left carotid endarterectomy December 9. Stop Plavix and Pletal today  Ruta Hinds, MD [facility] [place] Office: [phone] Pager: [phone]

## 2014-02-07 NOTE — Progress Notes (Signed)
Pt awake and alert in PACU  Filed Vitals:   02/07/14 1156 02/07/14 1200 02/07/14 1215 02/07/14 1230  BP: 136/65     Pulse: 58 57 57 55  Temp:  97.7 F (36.5 C)    TempSrc:      Resp: 15 18 11 11   Height:      Weight:      SpO2: 100% 100% 100% 100%    UE/LE motor 5/5 no facial asymmetry Left neck incision no hematoma To 3S soon  Ruta Hinds, MD [facility] [place] Office: [phone] Pager: [phone]

## 2014-02-07 NOTE — Anesthesia Procedure Notes (Signed)
Procedure Name: Intubation Date/Time: 02/07/2014 7:43 AM Performed by: Jenne Campus Pre-anesthesia Checklist: Patient identified, Emergency Drugs available, Suction available, Patient being monitored and Timeout performed Patient Re-evaluated:Patient Re-evaluated prior to inductionOxygen Delivery Method: Circle system utilized Preoxygenation: Pre-oxygenation with 100% oxygen Intubation Type: IV induction Ventilation: Mask ventilation without difficulty and Oral airway inserted - appropriate to patient size Laryngoscope Size: Miller and 2 Grade View: Grade I Tube type: Oral Tube size: 7.0 mm Number of attempts: 1 Airway Equipment and Method: Stylet Placement Confirmation: ETT inserted through vocal cords under direct vision,  positive ETCO2,  CO2 detector and breath sounds checked- equal and bilateral Secured at: 21 cm Tube secured with: Tape Dental Injury: Teeth and Oropharynx as per pre-operative assessment

## 2014-02-07 NOTE — Telephone Encounter (Signed)
-----   Message from Mena Goes, RN sent at 02/07/2014 11:28 AM EST ----- Regarding: Schedule   ----- Message -----    From: Alvia Grove, PA-C    Sent: 02/07/2014  10:06 AM      To: Vvs Charge Pool  S/p left CEA 02/07/14  F/u with Dr. [name] in 2 weeks.  Thanks, Maudie Mercury

## 2014-02-07 NOTE — Transfer of Care (Signed)
Immediate Anesthesia Transfer of Care Note  Patient: Joanne Morris  Procedure(s) Performed: Procedure(s): ENDARTERECTOMY CAROTID (Left) PATCH ANGIOPLASTY Carotid (Left)  Patient Location: PACU  Anesthesia Type:General  Level of Consciousness: awake, alert , oriented and patient cooperative  Airway & Oxygen Therapy: Patient Spontanous Breathing and Patient connected to nasal cannula oxygen  Post-op Assessment: Report given to PACU RN and Post -op Vital signs reviewed and stable  Post vital signs: Reviewed  Complications: No apparent anesthesia complications

## 2014-02-07 NOTE — Anesthesia Preprocedure Evaluation (Addendum)
Anesthesia Evaluation  Patient identified by MRN, date of birth, ID band Patient awake    Reviewed: Allergy & Precautions, H&P , NPO status , Patient's Chart, lab work & pertinent test results  History of Anesthesia Complications Negative for: history of anesthetic complications  Airway Mallampati: II  TM Distance: >3 FB Neck ROM: full    Dental  (+) Edentulous Upper, Poor Dentition, Dental Advisory Given   Pulmonary former smoker,  breath sounds clear to auscultation        Cardiovascular hypertension, Pt. on medications + angina + CAD, + Past MI and + Peripheral Vascular Disease Rhythm:Regular Rate:Normal     Neuro/Psych Anxiety Depression  Neuromuscular disease CVA, No Residual Symptoms    GI/Hepatic Neg liver ROS, GERD-  Medicated and Controlled,  Endo/Other  diabetes, Type 2  Renal/GU negative Renal ROS     Musculoskeletal  (+) Arthritis -,   Abdominal   Peds  Hematology   Anesthesia Other Findings   Reproductive/Obstetrics negative OB ROS                            Anesthesia Physical Anesthesia Plan  ASA: III  Anesthesia Plan: General   Post-op Pain Management:    Induction: Intravenous  Airway Management Planned: Oral ETT  Additional Equipment: Arterial line  Intra-op Plan:   Post-operative Plan: Extubation in OR  Informed Consent: I have reviewed the patients History and Physical, chart, labs and discussed the procedure including the risks, benefits and alternatives for the proposed anesthesia with the patient or authorized representative who has indicated his/her understanding and acceptance.     Plan Discussed with: CRNA, Anesthesiologist and Surgeon  Anesthesia Plan Comments:         Anesthesia Quick Evaluation

## 2014-02-07 NOTE — Anesthesia Postprocedure Evaluation (Signed)
Anesthesia Post Note  Patient: Joanne Morris  Procedure(s) Performed: Procedure(s) (LRB): ENDARTERECTOMY CAROTID (Left) PATCH ANGIOPLASTY Carotid (Left)  Anesthesia type: General  Patient location: PACU  Post pain: Pain level controlled and Adequate analgesia  Post assessment: Post-op Vital signs reviewed, Patient's Cardiovascular Status Stable, Respiratory Function Stable, Patent Airway and Pain level controlled  Last Vitals:  Filed Vitals:   02/07/14 1230  BP:   Pulse: 55  Temp:   Resp: 11    Post vital signs: Reviewed and stable  Level of consciousness: awake, alert  and oriented  Complications: No apparent anesthesia complications

## 2014-02-07 NOTE — Telephone Encounter (Signed)
Left msg for patient, dpm

## 2014-02-07 NOTE — Interval H&P Note (Signed)
History and Physical Interval Note:  02/07/2014 7:26 AM  Joanne Morris  has presented today for surgery, with the diagnosis of Left internal carotid artery stenosis I65.22  The various methods of treatment have been discussed with the patient and family. After consideration of risks, benefits and other options for treatment, the patient has consented to  Procedure(s): ENDARTERECTOMY CAROTID (Left) as a surgical intervention .  The patient's history has been reviewed, patient examined, no change in status, stable for surgery.  I have reviewed the patient's chart and labs.  Questions were answered to the patient's satisfaction.     FIELDS,CHARLES E

## 2014-02-08 ENCOUNTER — Encounter (HOSPITAL_COMMUNITY): Payer: Self-pay | Admitting: Vascular Surgery

## 2014-02-08 LAB — CBC
HCT: 28.6 % — ABNORMAL LOW (ref 36.0–46.0)
Hemoglobin: 9.1 g/dL — ABNORMAL LOW (ref 12.0–15.0)
MCH: 24.1 pg — ABNORMAL LOW (ref 26.0–34.0)
MCHC: 31.8 g/dL (ref 30.0–36.0)
MCV: 75.7 fL — AB (ref 78.0–100.0)
Platelets: 221 10*3/uL (ref 150–400)
RBC: 3.78 MIL/uL — AB (ref 3.87–5.11)
RDW: 16 % — ABNORMAL HIGH (ref 11.5–15.5)
WBC: 8.8 10*3/uL (ref 4.0–10.5)

## 2014-02-08 LAB — GLUCOSE, CAPILLARY
GLUCOSE-CAPILLARY: 108 mg/dL — AB (ref 70–99)
Glucose-Capillary: 125 mg/dL — ABNORMAL HIGH (ref 70–99)

## 2014-02-08 LAB — BASIC METABOLIC PANEL
ANION GAP: 10 (ref 5–15)
BUN: 12 mg/dL (ref 6–23)
CHLORIDE: 98 meq/L (ref 96–112)
CO2: 28 mmol/L (ref 19–32)
CREATININE: 0.9 mg/dL (ref 0.50–1.10)
Calcium: 9.3 mg/dL (ref 8.4–10.5)
GFR calc Af Amer: 77 mL/min — ABNORMAL LOW (ref >90)
GFR calc non Af Amer: 67 mL/min — ABNORMAL LOW (ref >90)
Glucose, Bld: 122 mg/dL — ABNORMAL HIGH (ref 70–99)
Potassium: 3.6 mmol/L (ref 3.5–5.1)
Sodium: 136 mmol/L (ref 135–145)

## 2014-02-08 MED ORDER — TRAMADOL HCL 50 MG PO TABS: 50.0000 mg | ORAL_TABLET | Freq: Four times a day (QID) | ORAL | Status: DC | PRN

## 2014-02-08 NOTE — Progress Notes (Signed)
Discharge instructions reviewed with patient. Patient verbalized understanding of discharge instructions with teach-back. All questions were answered. Patient discharged via wheelchair with belongings.

## 2014-02-08 NOTE — Progress Notes (Signed)
[facility] [place]  Subjective  - feels ok neck sore   Objective 140/54 58 98.1 F (36.7 C) (Oral) 12 98%  Intake/Output Summary (Last 24 hours) at 02/08/14 0745 Last data filed at 02/08/14 0539  Gross per 24 hour  Intake   2085 ml  Output   2100 ml  Net    -15 ml   Left neck incision healing no hematoma Neuro UE LE 5/5 motor, no facial asymmetry, tongue midline  Assessment/Planning: D/c home today on Plavix and ASA Follow up 2 weeks  FIELDS,CHARLES E 02/08/2014 7:45 AM --  Laboratory Lab Results:  Recent Labs  02/08/14 0222  WBC 8.8  HGB 9.1*  HCT 28.6*  PLT 221   BMET  Recent Labs  02/08/14 0222  NA 136  K 3.6  CL 98  CO2 28  GLUCOSE 122*  BUN 12  CREATININE 0.90  CALCIUM 9.3    COAG Lab Results  Component Value Date   INR 1.00 02/02/2014   INR 0.95 01/18/2014   INR 0.85 07/06/2013   No results found for: PTT

## 2014-02-09 NOTE — Discharge Summary (Signed)
[facility] Discharge Summary  Joanne Morris [birth year] 63 y.o. female  [id number]  Admission Date: 02/07/2014  Discharge Date: 02/08/2014  Physician: Ruta Hinds, MD  Admission Diagnosis: Left internal carotid artery stenosis I65.22   HPI:   This is a 63 y.o. female who presented for evaluation of asymptomatic left internal carotid artery stenosis. The patient denied any recent symptoms of TIA amaurosis or stroke. She states she did have a stroke 8 years ago and was treated at [facility]. She does not recall what symptoms she had at that time.   She stated that she previously had a coronary stent placed in [place]. She also complained of pain in her right leg with walking.   She previously has had angioplasty of her right peroneal artery by Dr. [name]. She had relief of her leg symptoms on the right side for approximately one month but states they are now worse again. She also complains of pain in her right foot. Some of the symptoms may be related to peripheral arterial disease but some also sound like degenerative joint changes. Other medical problems include diabetes, hypertension, elevated cholesterol. These are all currently stable. She is a former smoker but quit several years ago. She is on Plavix and aspirin.  Hospital Course:  The patient was admitted to the hospital and taken to the operating room on 02/07/2014 and underwent left carotid endarterectomy.  The patient tolerated the procedure well and was transported to the PACU in stable condition.  By POD 1, the patient's neuro status was intact. Her left neck incision was healing with no hematoma. She was restarted on Plavix and aspirin. She was discharged home on POD 1 in good condition.    Recent Labs  02/08/14 0222  NA 136  K 3.6  CL 98  CO2 28  GLUCOSE 122*  BUN 12  CALCIUM 9.3    Recent Labs  02/08/14 0222  WBC 8.8  HGB 9.1*  HCT 28.6*  PLT 221    Discharge  Instructions:   The patient is discharged to home with extensive instructions on wound care and progressive ambulation.  They are instructed not to drive or perform any heavy lifting until returning to see the physician in his office.  Discharge Instructions    CAROTID Sugery: Call MD for difficulty swallowing or speaking; weakness in arms or legs that is a new symtom; severe headache.  If you have increased swelling in the neck and/or  are having difficulty breathing, CALL [phone]    Complete by:  As directed      Call MD for:  redness, tenderness, or signs of infection (pain, swelling, bleeding, redness, odor or green/yellow discharge around incision site)    Complete by:  As directed      Call MD for:  severe or increased pain, loss or decreased feeling  in affected limb(s)    Complete by:  As directed      Call MD for:  temperature >100.5    Complete by:  As directed      Driving Restrictions    Complete by:  As directed   No driving for 2 weeks     Increase activity slowly    Complete by:  As directed   Walk with assistance use walker or cane as needed     Lifting restrictions    Complete by:  As directed   No lifting for 2 weeks     Resume previous diet    Complete  by:  As directed      may wash over wound with mild soap and water    Complete by:  As directed            Discharge Diagnosis:  Left internal carotid artery stenosis I65.22  Secondary Diagnosis: Patient Active Problem List   Diagnosis Date Noted  . Carotid artery stenosis, asymptomatic 02/07/2014  . PAD (peripheral artery disease) 07/20/2013  . Peripheral arterial disease 06/08/2013  . Abdominal pain 10/11/2012  . Dyslipidemia 10/11/2012  . Diabetes mellitus 10/11/2012  . Depression 10/11/2012  . Radiculopathy of leg 10/11/2012  . Atypical chest pain 05/31/2011  . CAD (coronary artery disease) 05/31/2011  . Hypokalemia 05/31/2011  . HTN (hypertension) 05/31/2011  . Anemia 05/31/2011  . Leucocytosis  05/31/2011  . DM2 (diabetes mellitus, type 2) 05/31/2011  . Hyperlipemia 05/31/2011   Past Medical History  Diagnosis Date  . Hypertension   . Coronary artery disease   . Angina   . Depression   . Claudication   . PAD (peripheral artery disease)   . High cholesterol   . Myocardial infarction 1990's    "1"  . Type II diabetes mellitus   . GERD (gastroesophageal reflux disease)   . Stroke     "mini stroke 1st then regular stroke", denies residual on 07/20/2013  . Arthritis     "knees" (07/20/2013)  . Anxiety   . Carotid stenosis   . Carotid stenosis   . Wears glasses       Medication List    TAKE these medications        ALPRAZolam 0.25 MG tablet  Commonly known as:  XANAX  Take 0.25 mg by mouth daily as needed for anxiety.     amLODipine 10 MG tablet  Commonly known as:  NORVASC  Take 10 mg by mouth daily.     aspirin EC 81 MG tablet  Take 81 mg by mouth daily.     cilostazol 100 MG tablet  Commonly known as:  PLETAL  Take 100 mg by mouth 2 (two) times daily.     clopidogrel 75 MG tablet  Commonly known as:  PLAVIX  Take 1 tablet (75 mg total) by mouth daily.     HYDROcodone-acetaminophen 5-325 MG per tablet  Commonly known as:  NORCO/VICODIN  Take 1 tablet by mouth every 6 (six) hours as needed for moderate pain.     lisinopril-hydrochlorothiazide 20-12.5 MG per tablet  Commonly known as:  PRINZIDE,ZESTORETIC  Take 1 tablet by mouth 2 (two) times daily.     meloxicam 7.5 MG tablet  Commonly known as:  MOBIC  Take 7.5 mg by mouth daily as needed for pain.     metFORMIN 500 MG tablet  Commonly known as:  GLUCOPHAGE  Take 1 tablet (500 mg total) by mouth daily with breakfast.     niacin 1000 MG CR tablet  Commonly known as:  NIASPAN  Take 1 tablet by mouth daily.     nitroGLYCERIN 0.4 MG SL tablet  Commonly known as:  NITROSTAT  Place 0.4 mg under the tongue every 5 (five) minutes as needed. For chest pain     omeprazole 20 MG capsule  Commonly  known as:  PRILOSEC  Take 20 mg by mouth daily.     pravastatin 40 MG tablet  Commonly known as:  PRAVACHOL  Take 40 mg by mouth daily.     traMADol 50 MG tablet  Commonly known as:  ULTRAM  Take 1  tablet (50 mg total) by mouth every 6 (six) hours as needed for moderate pain.     zolpidem 5 MG tablet  Commonly known as:  AMBIEN  Take 5 mg by mouth at bedtime as needed for sleep.       Tramadol #15 No Refill  Disposition: Home  Patient's condition: is Good  Follow up: 1. Dr.  [name] in 2 weeks.   Virgina Jock, PA-C [facility] [phone]  --- For [facility] use --- Instructions: Press F2 to tab through selections.  Delete question if not applicable.   Modified Rankin score at D/C (0-6): 0  IV medication needed for:  1. Hypertension: No 2. Hypotension: No  Post-op Complications: No  1. Post-op CVA or TIA: No  2. CN injury: No  3. Myocardial infarction: No  4.  CHF: No  5.  Dysrhythmia (new): No  6. Wound infection: No  7. Reperfusion symptoms: No  8. Return to OR: No   Discharge medications: Statin use:  Yes If No: [ ]  For Medical reasons, [ ]  Non-compliant, [ ]  Not-indicated ASA use:  Yes  If No: [ ]  For Medical reasons, [ ]  Non-compliant, [ ]  Not-indicated Beta blocker use:  No, on CCB If No: [ ]  For Medical reasons, [ ]  Non-compliant, [x ] Not-indicated ACE-Inhibitor use:  Yes If No: [ ]  For Medical reasons, [ ]  Non-compliant, [ ]  Not-indicated P2Y12 Antagonist use: Yes, [x ] Plavix, [ ]  Plasugrel, [ ]  Ticlopinine, [ ]  Ticagrelor, [ ]  Other, [ ]  No for medical reason, [ ]  Non-compliant, [ ]  Not-indicated Anti-coagulant use:  No, [ ]  Warfarin, [ ]  Rivaroxaban, [ ]  Dabigatran, [ ]  Other, [ ]  No for medical reason, [ ]  Non-compliant, [x ] Not-indicated

## 2014-02-14 ENCOUNTER — Telehealth: Payer: Self-pay

## 2014-02-14 NOTE — Telephone Encounter (Signed)
Phone call from pt.  Reported swelling of left neck incisional area.  Stated the incision is intact.   Denies any increased redness, tenderness, or drainage from left neck incision.  Denies any pulsatile nature to the area of swelling.  Reported her swallowing is okay, and that it has actually improved.  Denies any breathing difficulty.  Denies any fever.  Stated she tends to stay cold all the time since she is on blood thinners.  Encouraged to continue to monitor for signs/ symptoms of infection or worsening swelling and call office with any concerns.  Verb. Understanding.

## 2014-02-18 ENCOUNTER — Telehealth: Payer: Self-pay

## 2014-02-18 NOTE — Telephone Encounter (Signed)
Phone call from pt. Called to state her swelling of her left neck has gone down somewhat.  Stated she notices when she first gets up to walk, her heart seems to beat faster.  Denies any SOB or chest discomfort.  Stated she feels fine right now.  Reported has been resting / laying down a lot.  Advised to continue to monitor symptoms.   Encouraged to walk short distances and sit in chair sev. Times/day, and gradually increase activity, to build up strength and endurance.  Encouraged to report symptoms of increased heart rate to her medical doctor on Monday, if symptoms persist.  Adv. To go to the ER if symptoms worsen over the weekend.  Verb. Understanding.

## 2014-02-23 ENCOUNTER — Encounter: Payer: Self-pay | Admitting: Vascular Surgery

## 2014-02-24 ENCOUNTER — Ambulatory Visit (INDEPENDENT_AMBULATORY_CARE_PROVIDER_SITE_OTHER): Payer: Self-pay | Admitting: Vascular Surgery

## 2014-02-24 ENCOUNTER — Encounter (HOSPITAL_COMMUNITY): Payer: Self-pay | Admitting: Vascular Surgery

## 2014-02-24 VITALS — BP 184/90 | HR 111 | Ht 59.0 in | Wt 121.6 lb

## 2014-02-24 DIAGNOSIS — Z48812 Encounter for surgical aftercare following surgery on the circulatory system: Secondary | ICD-10-CM

## 2014-02-24 DIAGNOSIS — I6523 Occlusion and stenosis of bilateral carotid arteries: Secondary | ICD-10-CM

## 2014-02-24 NOTE — Progress Notes (Signed)
[facility] [place] HISTORY AND PHYSICAL    History of Present Illness:  Patient is a 64 y.o. year old female who presents for post-operative follow-up after left carotid endarterectomy.  Denies headaches, numbness, tingling or other neuro deficits.  No swallowing problems.  No incisional drainage.  She does have a known 7075% stenosis of the right internal carotid artery by recent CT Angio. This is asymptomatic. She does still complain of some soreness in the left neck. She is currently on Plavix and aspirin.   Physical Examination  Filed Vitals:   02/24/14 0855  BP: 184/90  Pulse:     Body mass index is 24.55 kg/(m^2).  General:  Alert and oriented, no acute distress Neck: No bruit or JVD Cardiac: 3/6 systolic murmur best heard at the right second interspace Skin: No rash Neurologic: Upper and lower extremity motor 5/5 and symmetric  ASSESSMENT: Overall doing well status post left carotid endarterectomy. She was hypertensive today. I counseled her to obtain a blood pressure cuff to check her blood pressure at home to get more realistic numbers.   PLAN:  She will follow-up with [place] in 1 month with a carotid duplex exam at that time and we will determine based on a carotid duplex whether or not to proceed with right carotid endarterectomy. She will continue her Plavix and aspirin.   Ruta Hinds, MD [facility] [place] Office: [phone] Pager: [phone]

## 2014-02-25 DIAGNOSIS — I10 Essential (primary) hypertension: Secondary | ICD-10-CM | POA: Diagnosis not present

## 2014-02-25 DIAGNOSIS — E1159 Type 2 diabetes mellitus with other circulatory complications: Secondary | ICD-10-CM | POA: Diagnosis not present

## 2014-02-25 DIAGNOSIS — I6523 Occlusion and stenosis of bilateral carotid arteries: Secondary | ICD-10-CM | POA: Diagnosis not present

## 2014-02-25 DIAGNOSIS — E784 Other hyperlipidemia: Secondary | ICD-10-CM | POA: Diagnosis not present

## 2014-02-25 DIAGNOSIS — F419 Anxiety disorder, unspecified: Secondary | ICD-10-CM | POA: Diagnosis not present

## 2014-03-01 ENCOUNTER — Other Ambulatory Visit: Payer: Self-pay | Admitting: Vascular Surgery

## 2014-03-06 DIAGNOSIS — M542 Cervicalgia: Secondary | ICD-10-CM | POA: Diagnosis not present

## 2014-03-06 DIAGNOSIS — I6503 Occlusion and stenosis of bilateral vertebral arteries: Secondary | ICD-10-CM | POA: Diagnosis not present

## 2014-03-06 DIAGNOSIS — E119 Type 2 diabetes mellitus without complications: Secondary | ICD-10-CM | POA: Diagnosis not present

## 2014-03-06 DIAGNOSIS — R002 Palpitations: Secondary | ICD-10-CM | POA: Diagnosis not present

## 2014-03-06 DIAGNOSIS — N390 Urinary tract infection, site not specified: Secondary | ICD-10-CM | POA: Diagnosis not present

## 2014-03-06 DIAGNOSIS — Z7902 Long term (current) use of antithrombotics/antiplatelets: Secondary | ICD-10-CM | POA: Diagnosis not present

## 2014-03-06 DIAGNOSIS — Z87891 Personal history of nicotine dependence: Secondary | ICD-10-CM | POA: Diagnosis not present

## 2014-03-06 DIAGNOSIS — I6521 Occlusion and stenosis of right carotid artery: Secondary | ICD-10-CM | POA: Diagnosis not present

## 2014-03-06 DIAGNOSIS — E780 Pure hypercholesterolemia: Secondary | ICD-10-CM | POA: Diagnosis not present

## 2014-03-06 DIAGNOSIS — I10 Essential (primary) hypertension: Secondary | ICD-10-CM | POA: Diagnosis not present

## 2014-03-08 ENCOUNTER — Telehealth: Payer: Self-pay

## 2014-03-08 NOTE — Telephone Encounter (Signed)
Phone call from pt.  C/o pain in back of her neck.  Stated when she saw Dr. [name] 2 weeks ago, she had some discomfort in the left neck incision, but that has improved.  Now stated that her back of her neck hurts.  Denied any recent neck strain or injury.  Questioned if this was related to the blockage on the right side?  Denied any headache, speech difficulty, unilateral weakness of extremities, facial droop.  Stated her balance is okay.  Requested a prescription for Hydrocodone for pain.  Advised that since her pain in unrelated to her last surgery, and that she is approx. 1 mo. Post-op, we would not be able to prescribe any pain medication.  Advised that she should notify her PCP of posterior neck pain.  Is requesting to have her blockage on the right neck rechecked.   Has appt. 03/31/14 for a 1 mo. F/u.  Advised will call her back if able to reschedule her f/u to an earlier date.  Verb. Understanding.

## 2014-03-09 ENCOUNTER — Telehealth: Payer: Self-pay | Admitting: Vascular Surgery

## 2014-03-09 NOTE — Telephone Encounter (Signed)
Please see if there is an avail. appt. for carotid duplex and f/u with Dr. [name] prior to 2/18; s/p (L) CEA; voiced concern about her right carotid blockage, and requesting earlier appt.   notified patient that there were not any earlier appointments available, she said she was ok with this and would wait until 03-31-14

## 2014-03-11 ENCOUNTER — Other Ambulatory Visit: Payer: Self-pay | Admitting: Vascular Surgery

## 2014-03-11 DIAGNOSIS — I10 Essential (primary) hypertension: Secondary | ICD-10-CM | POA: Diagnosis not present

## 2014-03-11 DIAGNOSIS — R000 Tachycardia, unspecified: Secondary | ICD-10-CM | POA: Diagnosis not present

## 2014-03-11 DIAGNOSIS — I6523 Occlusion and stenosis of bilateral carotid arteries: Secondary | ICD-10-CM | POA: Diagnosis not present

## 2014-03-30 ENCOUNTER — Encounter: Payer: Self-pay | Admitting: Vascular Surgery

## 2014-03-31 ENCOUNTER — Ambulatory Visit (INDEPENDENT_AMBULATORY_CARE_PROVIDER_SITE_OTHER): Payer: Self-pay | Admitting: Vascular Surgery

## 2014-03-31 ENCOUNTER — Ambulatory Visit (HOSPITAL_COMMUNITY)
Admission: RE | Admit: 2014-03-31 | Discharge: 2014-03-31 | Disposition: A | Discharge status: Home / Self Care | Payer: Medicare Other | Source: Ambulatory Visit | Attending: Vascular Surgery | Admitting: Vascular Surgery

## 2014-03-31 ENCOUNTER — Encounter: Payer: Self-pay | Admitting: Vascular Surgery

## 2014-03-31 VITALS — BP 157/74 | HR 80 | Ht 59.0 in | Wt 126.8 lb

## 2014-03-31 DIAGNOSIS — I6523 Occlusion and stenosis of bilateral carotid arteries: Secondary | ICD-10-CM | POA: Insufficient documentation

## 2014-03-31 DIAGNOSIS — Z48812 Encounter for surgical aftercare following surgery on the circulatory system: Secondary | ICD-10-CM

## 2014-03-31 NOTE — Progress Notes (Signed)
[facility] [place] HISTORY AND PHYSICAL    History of Present Illness:  patient is a 64 year old female who returns for follow-up today after recent left carotid endarterectomy. She also has a known greater than 70% right internal carotid artery stenosis. The patient denies any recent symptoms of TIA amaurosis or stroke. She states she did have a stroke 8 years ago and was treated at [facility]. She does not recall what symptoms she had at that time.    She states that she has previously had a coronary stent placed in [facility]. She also complains of pain in her right leg with walking.   She previously has had angioplasty of her right peroneal artery by Dr. [name].  She had relief of her leg symptoms on the right side for approximately one month but states they are now worse again. She also complains of pain in her right foot. Some of the symptoms may be related to peripheral arterial disease but some also sound like degenerative joint changes. Other medical problems include diabetes, hypertension, elevated cholesterol.  These are all currently stable.  She is a former smoker but quit several years ago.  She is on Plavix and aspirin.    Past Medical History    Diagnosis   Date    .   Hypertension       .   Coronary artery disease       .   Angina       .   Depression       .   Claudication       .   PAD (peripheral artery disease)       .   High cholesterol       .   Myocardial infarction   1990's          "1"    .   Type II diabetes mellitus       .   GERD (gastroesophageal reflux disease)       .   Stroke             "mini stroke 1st then regular stroke", denies residual on 07/20/2013    .   Arthritis             "knees" (07/20/2013)    .   Anxiety           Past Surgical History    Procedure   Laterality   Date    .   Abdominal hysterectomy          .   Tubal ligation          .   Esophagogastroduodenoscopy   N/A   10/13/2012          Procedure:  ESOPHAGOGASTRODUODENOSCOPY (EGD);  Surgeon: Gatha Mayer, MD;  Location: [facility] ENDOSCOPY;  Service: Endoscopy;  Laterality: N/A;    .   Lower extremity angiogram      07/20/2013          Unsuccessful attempt at crossing the CTO/notes 07/20/2013    .   Coronary angioplasty with stent placement                "1"    .   Balloon angioplasty, artery      10/05/2013          DR [name]    .   Atherectomy      10/05/2013      Social History History  Substance Use Topics    .   Smoking status:   Former Smoker -- 0.50 packs/day for 4 years          Types:   Cigarettes          Quit date:   05/30/2009    .   Smokeless tobacco:   Never Used    .   Alcohol Use:   No      Family History No family history on file.  Allergies  No Known Allergies     Current Outpatient Prescriptions on File Prior to Visit  Medication Sig Dispense Refill  . ALPRAZolam (XANAX) 0.25 MG tablet Take 0.25 mg by mouth daily as needed for anxiety.    Marland Kitchen amLODipine (NORVASC) 10 MG tablet Take 10 mg by mouth daily.    Marland Kitchen aspirin EC 81 MG tablet Take 81 mg by mouth daily.    . cilostazol (PLETAL) 100 MG tablet Take 100 mg by mouth 2 (two) times daily.     . clopidogrel (PLAVIX) 75 MG tablet Take 1 tablet (75 mg total) by mouth daily. 30 tablet 3  . HYDROcodone-acetaminophen (NORCO/VICODIN) 5-325 MG per tablet Take 1 tablet by mouth every 6 (six) hours as needed for moderate pain.     Marland Kitchen lisinopril-hydrochlorothiazide (PRINZIDE,ZESTORETIC) 20-12.5 MG per tablet Take 1 tablet by mouth 2 (two) times daily.     . meloxicam (MOBIC) 7.5 MG tablet Take 7.5 mg by mouth daily as needed for pain.     . metFORMIN (GLUCOPHAGE) 500 MG tablet Take 1 tablet (500 mg total) by mouth daily with breakfast.    . niacin (NIASPAN) 1000 MG CR tablet Take 1 tablet by mouth daily.  6  . nitroGLYCERIN (NITROSTAT) 0.4 MG SL tablet Place 0.4 mg under the tongue every 5 (five) minutes as needed. For chest pain    . omeprazole (PRILOSEC) 20 MG capsule Take  20 mg by mouth daily.    . pravastatin (PRAVACHOL) 40 MG tablet Take 40 mg by mouth daily.    . traMADol (ULTRAM) 50 MG tablet Take 1 tablet (50 mg total) by mouth every 6 (six) hours as needed for moderate pain. 15 tablet 0  . zolpidem (AMBIEN) 5 MG tablet Take 5 mg by mouth at bedtime as needed for sleep.     No current facility-administered medications on file prior to visit.    ROS:    General:  No weight loss, Fever, chills  HEENT: No recent headaches, no nasal bleeding, no visual changes, no sore throat  Neurologic: No dizziness, blackouts, seizures. No recent symptoms of stroke or mini- stroke. No recent episodes of slurred speech, or temporary blindness.  Cardiac: + recent episodes of chest pain/pressure, no shortness of breath at rest.  No shortness of breath with exertion.  Denies history of atrial fibrillation or irregular heartbeat  Vascular: No history of rest pain in feet.  +  history of claudication.  No history of non-healing ulcer, No history of DVT    Pulmonary: No home oxygen, no productive cough, no hemoptysis,  No asthma or wheezing  Musculoskeletal:  [ ]  Arthritis, [ ]  Low back pain,  [ x] Joint pain  Hematologic:No history of hypercoagulable state.  No history of easy bleeding.  No history of anemia  Gastrointestinal: No hematochezia or melena,  No gastroesophageal reflux, no trouble swallowing  Urinary: [ ]  chronic Kidney disease, [ ]  on HD - [ ]  MWF or [ ]  TTHS, [ ]  Burning with  urination, [ ]  Frequent urination, [ ]  Difficulty urinating;    Skin: No rashes  Psychological: No history of anxiety,  No history of depression   Physical Examination     Filed Vitals:   03/31/14 1135 03/31/14 1137  BP: 161/75 157/74  Pulse: 80   Height: 4\' 11"  (1.499 m)   Weight: 126 lb 12.8 oz (57.516 kg)   SpO2: 100%    General:  Alert and oriented, no acute distress HEENT: Normal Neck: right side soft carotid bruit no left carotid bruit, well-healed left neck  incision Pulmonary: Clear to auscultation bilaterally Cardiac: Regular Rate and Rhythm with grade 2 to 3/6 systolic murmur heard throughout precordium Abdomen: Soft, non-tender, non-distended, no mass Skin: No rash Extremity Pulses:  2+ radial, brachial, femoral,absent dorsalis pedis, posterior tibial pulses bilaterally Musculoskeletal: No deformity or edema     Neurologic: Upper and lower extremity motor 5/5 and symmetric  DATA:  Carotid duplex scan dated October 30 [facility] is reviewed today. This shows a high-grade greater than 80% stenosis of the left internal carotid artery with velocities of 365/122. This showed a moderate right internal carotid artery stenosis. There was antegrade vertebral flow. She had a repeat carotid duplex scan in our office a few weeks ago. However this only showed a 40-60% stenosis with much lower velocities although potentially underestimated due to calcific plaque.  She had a CT angiogram of her neck recently and I reviewed these images. This shows a moderate to high-grade stenosis of the right carotid bifurcation probably 60-80%.  She had a carotid duplex exam today which shows again 60-80% right internal carotid artery stenosis with velocities of 297/80 with no left carotid stenosis post endarterectomy   ASSESSMENT:  #1. Doing well status post left carotid endarterectomy #2 She will need right carotid endarterectomy at this point for stroke prophylaxis. We will stop her Plavix and Pletal on February 20. She will continue her aspirin. Right carotid endarterectomy scheduled for 04/12/2014. Risks benefits possible complications and procedure details were trying to the patient today including but not limited to bleeding infection stroke risk of 1-2% cranial nerve injury risk of 10-15%. She understands and agrees to proceed.   #2 right neck and chest pain. I reassured the patient that carotid disease is not cause neck pain.  She was recently seen by Dr.  Colan Neptune to be reasonable from a cardiac standpoint for carotid endarterectomy.                               #3 right leg pain most likely multifactorial.I would not consider any intervention for claudication symptoms alone for tibial disease whether this be with angioplasty stenting or operation. I believe that the risk of recurrence is high and believe the benefit is low. I advised her that if she has further questions regarding the angioplasty the Dr. [name] previously performed on her she could ask at her appointment.   PLAN:  see above  Ruta Hinds, MD [facility] [place] Office: [phone] Pager: [phone]

## 2014-04-01 ENCOUNTER — Other Ambulatory Visit: Payer: Self-pay

## 2014-04-05 DIAGNOSIS — I2510 Atherosclerotic heart disease of native coronary artery without angina pectoris: Secondary | ICD-10-CM | POA: Diagnosis not present

## 2014-04-05 DIAGNOSIS — I6523 Occlusion and stenosis of bilateral carotid arteries: Secondary | ICD-10-CM | POA: Diagnosis not present

## 2014-04-05 DIAGNOSIS — E1159 Type 2 diabetes mellitus with other circulatory complications: Secondary | ICD-10-CM | POA: Diagnosis not present

## 2014-04-05 DIAGNOSIS — I10 Essential (primary) hypertension: Secondary | ICD-10-CM | POA: Diagnosis not present

## 2014-04-05 DIAGNOSIS — M179 Osteoarthritis of knee, unspecified: Secondary | ICD-10-CM | POA: Diagnosis not present

## 2014-04-05 NOTE — Pre-Procedure Instructions (Signed)
Joanne Morris  04/05/2014   Your procedure is scheduled on:  04/12/14  Report to [facility] Admitting at 730 AM.  Call this number if you have problems the morning of surgery: [phone]   Remember:   Do not eat food or drink liquids after midnight.   Take these medicines the morning of surgery with A SIP OF WATER: xanax,norvasc,hydrocodone,metoprolol,prilosec,pletal   Do not wear jewelry, make-up or nail polish.  Do not wear lotions, powders, or perfumes. You may wear deodorant.  Do not shave 48 hours prior to surgery. Men may shave face and neck.  Do not bring valuables to the hospital.  [facility] is not responsible                  for any belongings or valuables.               Contacts, dentures or bridgework may not be worn into surgery.  Leave suitcase in the car. After surgery it may be brought to your room.  For patients admitted to the hospital, discharge time is determined by your                treatment team.               Patients discharged the day of surgery will not be allowed to drive  home.  Name and phone number of your driver: family  Special Instructions: Shower using CHG 2 nights before surgery and the night before surgery.  If you shower the day of surgery use CHG.  Use special wash - you have one bottle of CHG for all showers.  You should use approximately 1/3 of the bottle for each shower.   Please read over the following fact sheets that you were given: Pain Booklet, Coughing and Deep Breathing, Blood Transfusion Information, MRSA Information and Surgical Site Infection Prevention

## 2014-04-06 ENCOUNTER — Other Ambulatory Visit (HOSPITAL_COMMUNITY): Payer: Medicare Other

## 2014-04-06 ENCOUNTER — Encounter (HOSPITAL_COMMUNITY)
Admission: RE | Admit: 2014-04-06 | Discharge: 2014-04-06 | Disposition: A | Discharge status: Home / Self Care | Payer: Medicare Other | Source: Ambulatory Visit | Attending: Vascular Surgery | Admitting: Vascular Surgery

## 2014-04-06 ENCOUNTER — Encounter (HOSPITAL_COMMUNITY): Payer: Self-pay

## 2014-04-06 DIAGNOSIS — E119 Type 2 diabetes mellitus without complications: Secondary | ICD-10-CM | POA: Diagnosis not present

## 2014-04-06 DIAGNOSIS — Z7902 Long term (current) use of antithrombotics/antiplatelets: Secondary | ICD-10-CM | POA: Diagnosis not present

## 2014-04-06 DIAGNOSIS — I2510 Atherosclerotic heart disease of native coronary artery without angina pectoris: Secondary | ICD-10-CM | POA: Insufficient documentation

## 2014-04-06 DIAGNOSIS — Z79899 Other long term (current) drug therapy: Secondary | ICD-10-CM | POA: Insufficient documentation

## 2014-04-06 DIAGNOSIS — E780 Pure hypercholesterolemia: Secondary | ICD-10-CM | POA: Insufficient documentation

## 2014-04-06 DIAGNOSIS — Z01812 Encounter for preprocedural laboratory examination: Secondary | ICD-10-CM | POA: Insufficient documentation

## 2014-04-06 DIAGNOSIS — Z7982 Long term (current) use of aspirin: Secondary | ICD-10-CM | POA: Insufficient documentation

## 2014-04-06 DIAGNOSIS — I6521 Occlusion and stenosis of right carotid artery: Secondary | ICD-10-CM | POA: Diagnosis not present

## 2014-04-06 DIAGNOSIS — Z87891 Personal history of nicotine dependence: Secondary | ICD-10-CM | POA: Diagnosis not present

## 2014-04-06 DIAGNOSIS — I10 Essential (primary) hypertension: Secondary | ICD-10-CM | POA: Diagnosis not present

## 2014-04-06 LAB — CBC
HCT: 33.2 % — ABNORMAL LOW (ref 36.0–46.0)
Hemoglobin: 10.5 g/dL — ABNORMAL LOW (ref 12.0–15.0)
MCH: 24.5 pg — AB (ref 26.0–34.0)
MCHC: 31.6 g/dL (ref 30.0–36.0)
MCV: 77.6 fL — ABNORMAL LOW (ref 78.0–100.0)
PLATELETS: 293 10*3/uL (ref 150–400)
RBC: 4.28 MIL/uL (ref 3.87–5.11)
RDW: 15.6 % — ABNORMAL HIGH (ref 11.5–15.5)
WBC: 6.1 10*3/uL (ref 4.0–10.5)

## 2014-04-06 LAB — URINE MICROSCOPIC-ADD ON

## 2014-04-06 LAB — COMPREHENSIVE METABOLIC PANEL
ALBUMIN: 4.5 g/dL (ref 3.5–5.2)
ALK PHOS: 56 U/L (ref 39–117)
ALT: 11 U/L (ref 0–35)
AST: 18 U/L (ref 0–37)
Anion gap: 9 (ref 5–15)
BUN: 15 mg/dL (ref 6–23)
CO2: 30 mmol/L (ref 19–32)
CREATININE: 1.12 mg/dL — AB (ref 0.50–1.10)
Calcium: 10.1 mg/dL (ref 8.4–10.5)
Chloride: 99 mmol/L (ref 96–112)
GFR calc Af Amer: 59 mL/min — ABNORMAL LOW (ref >90)
GFR calc non Af Amer: 51 mL/min — ABNORMAL LOW (ref >90)
Glucose, Bld: 158 mg/dL — ABNORMAL HIGH (ref 70–99)
POTASSIUM: 3.5 mmol/L (ref 3.5–5.1)
Sodium: 138 mmol/L (ref 135–145)
TOTAL PROTEIN: 7.2 g/dL (ref 6.0–8.3)
Total Bilirubin: 0.5 mg/dL (ref 0.3–1.2)

## 2014-04-06 LAB — TYPE AND SCREEN
ABO/RH(D): O POS
Antibody Screen: NEGATIVE

## 2014-04-06 LAB — URINALYSIS, ROUTINE W REFLEX MICROSCOPIC
Bilirubin Urine: NEGATIVE
GLUCOSE, UA: NEGATIVE mg/dL
Ketones, ur: NEGATIVE mg/dL
Leukocytes, UA: NEGATIVE
Nitrite: NEGATIVE
Protein, ur: NEGATIVE mg/dL
Specific Gravity, Urine: 1.017 (ref 1.005–1.030)
Urobilinogen, UA: 0.2 mg/dL (ref 0.0–1.0)
pH: 6 (ref 5.0–8.0)

## 2014-04-06 LAB — PROTIME-INR
INR: 0.97 (ref 0.00–1.49)
PROTHROMBIN TIME: 12.9 s (ref 11.6–15.2)

## 2014-04-06 LAB — SURGICAL PCR SCREEN
MRSA, PCR: NEGATIVE
Staphylococcus aureus: NEGATIVE

## 2014-04-06 LAB — APTT: aPTT: 34 seconds (ref 24–37)

## 2014-04-06 MED ORDER — CHLORHEXIDINE GLUCONATE 4 % EX LIQD: 60.0000 mL | Freq: Once | CUTANEOUS | Status: DC

## 2014-04-11 ENCOUNTER — Telehealth: Payer: Self-pay

## 2014-04-11 DIAGNOSIS — I10 Essential (primary) hypertension: Secondary | ICD-10-CM | POA: Diagnosis not present

## 2014-04-11 DIAGNOSIS — R0602 Shortness of breath: Secondary | ICD-10-CM | POA: Diagnosis not present

## 2014-04-11 DIAGNOSIS — R079 Chest pain, unspecified: Secondary | ICD-10-CM | POA: Diagnosis not present

## 2014-04-11 DIAGNOSIS — R0789 Other chest pain: Secondary | ICD-10-CM | POA: Diagnosis not present

## 2014-04-11 DIAGNOSIS — E119 Type 2 diabetes mellitus without complications: Secondary | ICD-10-CM | POA: Diagnosis not present

## 2014-04-11 NOTE — Telephone Encounter (Signed)
Phone call from pt.  Reported she has been having intermittent chest pain since Friday, and requested to reschedule her carotid surgery to a later date.  Describes the pain as "soreness" in the mid, anterior chest.  Denies any radiation of the pain.  Denies any SOB.  Stated she hasn't notified her PCP.  Encouraged pt. to go to the ER to have the chest pain evaluated, to r/o Cardiac source.  Verb. Understanding.  Stated she will call someone to take her to the ER.  Rescheduled surgery for right CEA to 04/18/14.  Pt. Will call back if needs to reschedule surgery from 3/7.

## 2014-04-15 NOTE — Progress Notes (Signed)
I spoke with patient and she confirmed new surgical date, 04/18/14 and arrival time of 5:30.

## 2014-04-17 MED ORDER — CEFUROXIME SODIUM 1.5 G IJ SOLR
1.5000 g | INTRAMUSCULAR | Status: DC
  Filled 2014-04-17: qty 1.5

## 2014-04-18 ENCOUNTER — Inpatient Hospital Stay (HOSPITAL_COMMUNITY): Payer: Medicare Other | Admitting: Anesthesiology

## 2014-04-18 ENCOUNTER — Inpatient Hospital Stay (HOSPITAL_COMMUNITY)
Admission: RE | Admit: 2014-04-18 | Discharge: 2014-04-19 | DRG: 038 | Disposition: A | Discharge status: Home / Self Care | Payer: Medicare Other | Source: Ambulatory Visit | Attending: Vascular Surgery | Admitting: Vascular Surgery

## 2014-04-18 ENCOUNTER — Encounter (HOSPITAL_COMMUNITY)
Admission: RE | Disposition: A | Discharge status: Home / Self Care | Payer: Self-pay | Source: Ambulatory Visit | Attending: Vascular Surgery

## 2014-04-18 ENCOUNTER — Encounter (HOSPITAL_COMMUNITY): Payer: Self-pay | Admitting: Certified Registered"

## 2014-04-18 DIAGNOSIS — F419 Anxiety disorder, unspecified: Secondary | ICD-10-CM | POA: Diagnosis not present

## 2014-04-18 DIAGNOSIS — I739 Peripheral vascular disease, unspecified: Secondary | ICD-10-CM | POA: Diagnosis present

## 2014-04-18 DIAGNOSIS — Z955 Presence of coronary angioplasty implant and graft: Secondary | ICD-10-CM

## 2014-04-18 DIAGNOSIS — I10 Essential (primary) hypertension: Secondary | ICD-10-CM | POA: Diagnosis present

## 2014-04-18 DIAGNOSIS — E780 Pure hypercholesterolemia: Secondary | ICD-10-CM | POA: Diagnosis not present

## 2014-04-18 DIAGNOSIS — M1990 Unspecified osteoarthritis, unspecified site: Secondary | ICD-10-CM | POA: Diagnosis present

## 2014-04-18 DIAGNOSIS — I6529 Occlusion and stenosis of unspecified carotid artery: Secondary | ICD-10-CM

## 2014-04-18 DIAGNOSIS — F329 Major depressive disorder, single episode, unspecified: Secondary | ICD-10-CM | POA: Diagnosis not present

## 2014-04-18 DIAGNOSIS — I2510 Atherosclerotic heart disease of native coronary artery without angina pectoris: Secondary | ICD-10-CM | POA: Diagnosis present

## 2014-04-18 DIAGNOSIS — Z8673 Personal history of transient ischemic attack (TIA), and cerebral infarction without residual deficits: Secondary | ICD-10-CM

## 2014-04-18 DIAGNOSIS — K219 Gastro-esophageal reflux disease without esophagitis: Secondary | ICD-10-CM | POA: Diagnosis not present

## 2014-04-18 DIAGNOSIS — I252 Old myocardial infarction: Secondary | ICD-10-CM | POA: Diagnosis not present

## 2014-04-18 DIAGNOSIS — E119 Type 2 diabetes mellitus without complications: Secondary | ICD-10-CM | POA: Diagnosis present

## 2014-04-18 DIAGNOSIS — I6521 Occlusion and stenosis of right carotid artery: Secondary | ICD-10-CM | POA: Diagnosis not present

## 2014-04-18 DIAGNOSIS — D62 Acute posthemorrhagic anemia: Secondary | ICD-10-CM | POA: Diagnosis not present

## 2014-04-18 DIAGNOSIS — Z90710 Acquired absence of both cervix and uterus: Secondary | ICD-10-CM | POA: Diagnosis not present

## 2014-04-18 DIAGNOSIS — Z87891 Personal history of nicotine dependence: Secondary | ICD-10-CM | POA: Diagnosis not present

## 2014-04-18 DIAGNOSIS — R599 Enlarged lymph nodes, unspecified: Secondary | ICD-10-CM | POA: Diagnosis not present

## 2014-04-18 HISTORY — PX: PATCH ANGIOPLASTY: SHX6230

## 2014-04-18 HISTORY — PX: ENDARTERECTOMY: SHX5162

## 2014-04-18 HISTORY — DX: Occlusion and stenosis of unspecified carotid artery: I65.29

## 2014-04-18 LAB — CBC
HCT: 28.5 % — ABNORMAL LOW (ref 36.0–46.0)
Hemoglobin: 9.4 g/dL — ABNORMAL LOW (ref 12.0–15.0)
MCH: 24.9 pg — AB (ref 26.0–34.0)
MCHC: 33 g/dL (ref 30.0–36.0)
MCV: 75.6 fL — ABNORMAL LOW (ref 78.0–100.0)
PLATELETS: 243 10*3/uL (ref 150–400)
RBC: 3.77 MIL/uL — ABNORMAL LOW (ref 3.87–5.11)
RDW: 15.1 % (ref 11.5–15.5)
WBC: 9.8 10*3/uL (ref 4.0–10.5)

## 2014-04-18 LAB — GLUCOSE, CAPILLARY
GLUCOSE-CAPILLARY: 146 mg/dL — AB (ref 70–99)
GLUCOSE-CAPILLARY: 98 mg/dL (ref 70–99)
GLUCOSE-CAPILLARY: 90 mg/dL (ref 70–99)
Glucose-Capillary: 140 mg/dL — ABNORMAL HIGH (ref 70–99)
Glucose-Capillary: 171 mg/dL — ABNORMAL HIGH (ref 70–99)

## 2014-04-18 LAB — CREATININE, SERUM
CREATININE: 0.88 mg/dL (ref 0.50–1.10)
GFR calc non Af Amer: 68 mL/min — ABNORMAL LOW (ref >90)
GFR, EST AFRICAN AMERICAN: 79 mL/min — AB (ref >90)

## 2014-04-18 SURGERY — ENDARTERECTOMY, CAROTID
Anesthesia: General | Site: Neck | Laterality: Right

## 2014-04-18 MED ORDER — HYDROCODONE-ACETAMINOPHEN 5-325 MG PO TABS
1.0000 | ORAL_TABLET | Freq: Four times a day (QID) | ORAL | Status: DC | PRN
  Administered 2014-04-18: 1 via ORAL
  Filled 2014-04-18: qty 1

## 2014-04-18 MED ORDER — LACTATED RINGERS IV SOLN
INTRAVENOUS | Status: DC | PRN
  Administered 2014-04-18 (×2): via INTRAVENOUS

## 2014-04-18 MED ORDER — PROPOFOL 10 MG/ML IV BOLUS
INTRAVENOUS | Status: AC
  Filled 2014-04-18: qty 20

## 2014-04-18 MED ORDER — FENTANYL CITRATE 0.05 MG/ML IJ SOLN
INTRAMUSCULAR | Status: AC
  Filled 2014-04-18: qty 5

## 2014-04-18 MED ORDER — NITROGLYCERIN IN D5W 200-5 MCG/ML-% IV SOLN
0.0000 ug/min | INTRAVENOUS | Status: DC
  Filled 2014-04-18: qty 250

## 2014-04-18 MED ORDER — MORPHINE SULFATE 2 MG/ML IJ SOLN
2.0000 mg | INTRAMUSCULAR | Status: DC | PRN
  Administered 2014-04-18: 4 mg via INTRAVENOUS
  Filled 2014-04-18: qty 2

## 2014-04-18 MED ORDER — HYDROCHLOROTHIAZIDE 12.5 MG PO CAPS
12.5000 mg | ORAL_CAPSULE | Freq: Every day | ORAL | Status: DC
  Administered 2014-04-19: 12.5 mg via ORAL
  Filled 2014-04-18: qty 1

## 2014-04-18 MED ORDER — OXYCODONE HCL 5 MG PO TABS
5.0000 mg | ORAL_TABLET | Freq: Once | ORAL | Status: DC | PRN
Start: 2014-04-18 — End: 2014-04-18

## 2014-04-18 MED ORDER — DEXTROSE 5 % IV SOLN
1.5000 g | Freq: Two times a day (BID) | INTRAVENOUS | Status: AC
  Administered 2014-04-18 – 2014-04-19 (×2): 1.5 g via INTRAVENOUS
  Filled 2014-04-18 (×2): qty 1.5

## 2014-04-18 MED ORDER — POTASSIUM CHLORIDE CRYS ER 20 MEQ PO TBCR
20.0000 meq | EXTENDED_RELEASE_TABLET | Freq: Every day | ORAL | Status: DC | PRN
Start: 2014-04-18 — End: 2014-04-19

## 2014-04-18 MED ORDER — ALUM & MAG HYDROXIDE-SIMETH 200-200-20 MG/5ML PO SUSP: 15.0000 mL | ORAL | Status: DC | PRN

## 2014-04-18 MED ORDER — NITROGLYCERIN 0.4 MG SL SUBL: 0.4000 mg | SUBLINGUAL_TABLET | SUBLINGUAL | Status: DC | PRN

## 2014-04-18 MED ORDER — ALPRAZOLAM 0.25 MG PO TABS: 0.2500 mg | ORAL_TABLET | Freq: Every day | ORAL | Status: DC | PRN

## 2014-04-18 MED ORDER — PANTOPRAZOLE SODIUM 40 MG PO TBEC
40.0000 mg | DELAYED_RELEASE_TABLET | Freq: Every day | ORAL | Status: DC
  Administered 2014-04-19: 40 mg via ORAL
  Filled 2014-04-18: qty 1

## 2014-04-18 MED ORDER — ENOXAPARIN SODIUM 40 MG/0.4ML ~~LOC~~ SOLN
40.0000 mg | SUBCUTANEOUS | Status: DC
Start: 2014-04-19 — End: 2014-04-19
  Administered 2014-04-19: 40 mg via SUBCUTANEOUS
  Filled 2014-04-18: qty 0.4

## 2014-04-18 MED ORDER — PROMETHAZINE HCL 25 MG/ML IJ SOLN
6.2500 mg | INTRAMUSCULAR | Status: DC | PRN
  Administered 2014-04-18: 6.25 mg via INTRAVENOUS

## 2014-04-18 MED ORDER — DOPAMINE-DEXTROSE 3.2-5 MG/ML-% IV SOLN: 3.0000 ug/kg/min | INTRAVENOUS | Status: DC | PRN

## 2014-04-18 MED ORDER — NIACIN ER (ANTIHYPERLIPIDEMIC) 1000 MG PO TBCR: 1000.0000 mg | EXTENDED_RELEASE_TABLET | Freq: Every day | ORAL | Status: DC

## 2014-04-18 MED ORDER — PROTAMINE SULFATE 10 MG/ML IV SOLN
INTRAVENOUS | Status: DC | PRN
  Administered 2014-04-18: 60 mg via INTRAVENOUS

## 2014-04-18 MED ORDER — LISINOPRIL-HYDROCHLOROTHIAZIDE 20-12.5 MG PO TABS: 1.0000 | ORAL_TABLET | Freq: Two times a day (BID) | ORAL | Status: DC

## 2014-04-18 MED ORDER — LIDOCAINE HCL (CARDIAC) 20 MG/ML IV SOLN
INTRAVENOUS | Status: DC | PRN
  Administered 2014-04-18: 60 mg via INTRATRACHEAL

## 2014-04-18 MED ORDER — HYDROMORPHONE HCL 1 MG/ML IJ SOLN
0.2500 mg | INTRAMUSCULAR | Status: DC | PRN
  Administered 2014-04-18 (×2): 0.5 mg via INTRAVENOUS

## 2014-04-18 MED ORDER — MIDAZOLAM HCL 2 MG/2ML IJ SOLN
INTRAMUSCULAR | Status: AC
  Filled 2014-04-18: qty 2

## 2014-04-18 MED ORDER — METOPROLOL TARTRATE 1 MG/ML IV SOLN: 2.0000 mg | INTRAVENOUS | Status: DC | PRN

## 2014-04-18 MED ORDER — THROMBIN 20000 UNITS EX SOLR
CUTANEOUS | Status: AC
  Filled 2014-04-18: qty 20000

## 2014-04-18 MED ORDER — GLYCOPYRROLATE 0.2 MG/ML IJ SOLN
INTRAMUSCULAR | Status: AC
  Filled 2014-04-18: qty 2

## 2014-04-18 MED ORDER — ACETAMINOPHEN 325 MG PO TABS: 325.0000 mg | ORAL_TABLET | ORAL | Status: DC | PRN

## 2014-04-18 MED ORDER — LIDOCAINE HCL (CARDIAC) 20 MG/ML IV SOLN
INTRAVENOUS | Status: AC
  Filled 2014-04-18: qty 5

## 2014-04-18 MED ORDER — EPHEDRINE SULFATE 50 MG/ML IJ SOLN
INTRAMUSCULAR | Status: AC
  Filled 2014-04-18: qty 1

## 2014-04-18 MED ORDER — GUAIFENESIN-DM 100-10 MG/5ML PO SYRP: 15.0000 mL | ORAL_SOLUTION | ORAL | Status: DC | PRN

## 2014-04-18 MED ORDER — NIACIN ER 500 MG PO CPCR
1000.0000 mg | ORAL_CAPSULE | Freq: Every day | ORAL | Status: DC
  Administered 2014-04-18: 1000 mg via ORAL
  Filled 2014-04-18 (×2): qty 2

## 2014-04-18 MED ORDER — OXYCODONE HCL 5 MG/5ML PO SOLN: 5.0000 mg | Freq: Once | ORAL | Status: DC | PRN

## 2014-04-18 MED ORDER — SODIUM CHLORIDE 0.9 % IV SOLN: INTRAVENOUS | Status: DC

## 2014-04-18 MED ORDER — SODIUM CHLORIDE 0.9 % IV SOLN
500.0000 mL | Freq: Once | INTRAVENOUS | Status: AC | PRN
  Administered 2014-04-18: 500 mL via INTRAVENOUS

## 2014-04-18 MED ORDER — FENTANYL CITRATE 0.05 MG/ML IJ SOLN
INTRAMUSCULAR | Status: DC | PRN
  Administered 2014-04-18 (×4): 50 ug via INTRAVENOUS

## 2014-04-18 MED ORDER — LISINOPRIL 20 MG PO TABS
20.0000 mg | ORAL_TABLET | Freq: Every day | ORAL | Status: DC
  Administered 2014-04-19: 20 mg via ORAL
  Filled 2014-04-18: qty 1

## 2014-04-18 MED ORDER — PROTAMINE SULFATE 10 MG/ML IV SOLN
INTRAVENOUS | Status: AC
  Filled 2014-04-18: qty 10

## 2014-04-18 MED ORDER — HYDRALAZINE HCL 20 MG/ML IJ SOLN
5.0000 mg | INTRAMUSCULAR | Status: DC | PRN
  Filled 2014-04-18: qty 0.25

## 2014-04-18 MED ORDER — SODIUM CHLORIDE 0.9 % IJ SOLN
INTRAMUSCULAR | Status: AC
  Filled 2014-04-18: qty 10

## 2014-04-18 MED ORDER — GLYCOPYRROLATE 0.2 MG/ML IJ SOLN
INTRAMUSCULAR | Status: DC | PRN
  Administered 2014-04-18: 0.2 mg via INTRAVENOUS
  Administered 2014-04-18: 0.4 mg via INTRAVENOUS

## 2014-04-18 MED ORDER — PROPOFOL 10 MG/ML IV BOLUS
INTRAVENOUS | Status: DC | PRN
  Administered 2014-04-18: 150 mg via INTRAVENOUS

## 2014-04-18 MED ORDER — HEPARIN SODIUM (PORCINE) 1000 UNIT/ML IJ SOLN
INTRAMUSCULAR | Status: AC
  Filled 2014-04-18: qty 1

## 2014-04-18 MED ORDER — ONDANSETRON HCL 4 MG/2ML IJ SOLN
INTRAMUSCULAR | Status: DC | PRN
  Administered 2014-04-18: 4 mg via INTRAVENOUS

## 2014-04-18 MED ORDER — HEPARIN SODIUM (PORCINE) 5000 UNIT/ML IJ SOLN
INTRAMUSCULAR | Status: DC | PRN
  Administered 2014-04-18: 500 mL

## 2014-04-18 MED ORDER — HYDROMORPHONE HCL 1 MG/ML IJ SOLN
INTRAMUSCULAR | Status: AC
  Filled 2014-04-18: qty 1

## 2014-04-18 MED ORDER — AMLODIPINE BESYLATE 10 MG PO TABS
10.0000 mg | ORAL_TABLET | Freq: Every day | ORAL | Status: DC
  Administered 2014-04-19: 10 mg via ORAL
  Filled 2014-04-18: qty 1

## 2014-04-18 MED ORDER — LIDOCAINE HCL (PF) 1 % IJ SOLN
INTRAMUSCULAR | Status: AC
  Filled 2014-04-18: qty 30

## 2014-04-18 MED ORDER — BISACODYL 10 MG RE SUPP: 10.0000 mg | Freq: Every day | RECTAL | Status: DC | PRN

## 2014-04-18 MED ORDER — NITROGLYCERIN 0.2 MG/ML ON CALL CATH LAB
INTRAVENOUS | Status: DC | PRN
  Administered 2014-04-18 (×2): 80 ug via INTRAVENOUS
  Administered 2014-04-18: 40 ug via INTRAVENOUS

## 2014-04-18 MED ORDER — CLOPIDOGREL BISULFATE 75 MG PO TABS
75.0000 mg | ORAL_TABLET | Freq: Every day | ORAL | Status: DC
  Administered 2014-04-19: 75 mg via ORAL
  Filled 2014-04-18: qty 1

## 2014-04-18 MED ORDER — DOCUSATE SODIUM 100 MG PO CAPS
100.0000 mg | ORAL_CAPSULE | Freq: Every day | ORAL | Status: DC
  Administered 2014-04-19: 100 mg via ORAL
  Filled 2014-04-18: qty 1

## 2014-04-18 MED ORDER — ROCURONIUM BROMIDE 100 MG/10ML IV SOLN
INTRAVENOUS | Status: DC | PRN
  Administered 2014-04-18: 50 mg via INTRAVENOUS

## 2014-04-18 MED ORDER — ASPIRIN EC 325 MG PO TBEC
325.0000 mg | DELAYED_RELEASE_TABLET | Freq: Every day | ORAL | Status: DC
  Administered 2014-04-18 – 2014-04-19 (×2): 325 mg via ORAL
  Filled 2014-04-18 (×2): qty 1

## 2014-04-18 MED ORDER — CEFUROXIME SODIUM 1.5 G IJ SOLR
1.5000 g | INTRAMUSCULAR | Status: DC | PRN
  Administered 2014-04-18: 1.5 g via INTRAVENOUS

## 2014-04-18 MED ORDER — INSULIN ASPART 100 UNIT/ML ~~LOC~~ SOLN
0.0000 [IU] | Freq: Three times a day (TID) | SUBCUTANEOUS | Status: DC
  Administered 2014-04-19 (×2): 1 [IU] via SUBCUTANEOUS

## 2014-04-18 MED ORDER — 0.9 % SODIUM CHLORIDE (POUR BTL) OPTIME
TOPICAL | Status: DC | PRN
  Administered 2014-04-18: 2000 mL

## 2014-04-18 MED ORDER — METOPROLOL TARTRATE 50 MG PO TABS
50.0000 mg | ORAL_TABLET | Freq: Two times a day (BID) | ORAL | Status: DC
  Administered 2014-04-19: 50 mg via ORAL
  Filled 2014-04-18 (×3): qty 1

## 2014-04-18 MED ORDER — ONDANSETRON HCL 4 MG/2ML IJ SOLN
INTRAMUSCULAR | Status: AC
  Filled 2014-04-18: qty 2

## 2014-04-18 MED ORDER — ZOLPIDEM TARTRATE 5 MG PO TABS
5.0000 mg | ORAL_TABLET | Freq: Every evening | ORAL | Status: DC | PRN
  Administered 2014-04-18: 5 mg via ORAL
  Filled 2014-04-18: qty 1

## 2014-04-18 MED ORDER — METFORMIN HCL 500 MG PO TABS
500.0000 mg | ORAL_TABLET | Freq: Every day | ORAL | Status: DC
  Administered 2014-04-19: 500 mg via ORAL
  Filled 2014-04-18 (×2): qty 1

## 2014-04-18 MED ORDER — PRAVASTATIN SODIUM 40 MG PO TABS
40.0000 mg | ORAL_TABLET | Freq: Every day | ORAL | Status: DC
  Administered 2014-04-18: 40 mg via ORAL
  Filled 2014-04-18 (×2): qty 1

## 2014-04-18 MED ORDER — PROMETHAZINE HCL 25 MG/ML IJ SOLN
INTRAMUSCULAR | Status: AC
  Filled 2014-04-18: qty 1

## 2014-04-18 MED ORDER — PHENOL 1.4 % MT LIQD: 1.0000 | OROMUCOSAL | Status: DC | PRN

## 2014-04-18 MED ORDER — SUCCINYLCHOLINE CHLORIDE 20 MG/ML IJ SOLN
INTRAMUSCULAR | Status: AC
  Filled 2014-04-18: qty 1

## 2014-04-18 MED ORDER — ROCURONIUM BROMIDE 50 MG/5ML IV SOLN
INTRAVENOUS | Status: AC
  Filled 2014-04-18: qty 1

## 2014-04-18 MED ORDER — HEPARIN SODIUM (PORCINE) 1000 UNIT/ML IJ SOLN
INTRAMUSCULAR | Status: DC | PRN
  Administered 2014-04-18: 6000 [IU] via INTRAVENOUS

## 2014-04-18 MED ORDER — BOOST / RESOURCE BREEZE PO LIQD
1.0000 | Freq: Three times a day (TID) | ORAL | Status: DC
  Administered 2014-04-18 – 2014-04-19 (×3): 1 via ORAL

## 2014-04-18 MED ORDER — ONDANSETRON HCL 4 MG/2ML IJ SOLN: 4.0000 mg | Freq: Four times a day (QID) | INTRAMUSCULAR | Status: DC | PRN

## 2014-04-18 MED ORDER — LABETALOL HCL 5 MG/ML IV SOLN
10.0000 mg | INTRAVENOUS | Status: DC | PRN
  Filled 2014-04-18: qty 4

## 2014-04-18 MED ORDER — PHENYLEPHRINE HCL 10 MG/ML IJ SOLN
10.0000 mg | INTRAVENOUS | Status: DC | PRN
  Administered 2014-04-18: 20 ug/min via INTRAVENOUS

## 2014-04-18 MED ORDER — ACETAMINOPHEN 650 MG RE SUPP
325.0000 mg | RECTAL | Status: DC | PRN
Start: 2014-04-18 — End: 2014-04-19

## 2014-04-18 MED ORDER — NEOSTIGMINE METHYLSULFATE 10 MG/10ML IV SOLN
INTRAVENOUS | Status: DC | PRN
  Administered 2014-04-18: 3 mg via INTRAVENOUS

## 2014-04-18 MED ORDER — POTASSIUM CHLORIDE IN NACL 20-0.9 MEQ/L-% IV SOLN
INTRAVENOUS | Status: DC
  Administered 2014-04-18: 20:00:00 via INTRAVENOUS
  Filled 2014-04-18 (×3): qty 1000

## 2014-04-18 SURGICAL SUPPLY — 54 items
CANISTER SUCTION 2500CC (MISCELLANEOUS) ×2 IMPLANT
CANNULA VESSEL 3MM 2 BLNT TIP (CANNULA) ×3 IMPLANT
CATH ROBINSON RED A/P 18FR (CATHETERS) ×2 IMPLANT
CLIP TI MEDIUM 6 (CLIP) ×2 IMPLANT
CLIP TI WIDE RED SMALL 6 (CLIP) ×2 IMPLANT
CONT SPEC 4OZ CLIKSEAL STRL BL (MISCELLANEOUS) ×1 IMPLANT
COVER PROBE W GEL 5X96 (DRAPES) ×1 IMPLANT
CRADLE DONUT ADULT HEAD (MISCELLANEOUS) ×2 IMPLANT
DECANTER SPIKE VIAL GLASS SM (MISCELLANEOUS) IMPLANT
DRAIN HEMOVAC 1/8 X 5 (WOUND CARE) IMPLANT
ELECT REM PT RETURN 9FT ADLT (ELECTROSURGICAL) ×2
ELECTRODE REM PT RTRN 9FT ADLT (ELECTROSURGICAL) ×1 IMPLANT
EVACUATOR SILICONE 100CC (DRAIN) IMPLANT
GAUZE SPONGE 4X4 12PLY STRL (GAUZE/BANDAGES/DRESSINGS) ×1 IMPLANT
GLOVE BIO SURGEON STRL SZ 6.5 (GLOVE) ×1 IMPLANT
GLOVE BIO SURGEON STRL SZ7.5 (GLOVE) ×2 IMPLANT
GLOVE BIOGEL PI IND STRL 6.5 (GLOVE) IMPLANT
GLOVE BIOGEL PI IND STRL 7.0 (GLOVE) IMPLANT
GLOVE BIOGEL PI INDICATOR 6.5 (GLOVE) ×2
GLOVE BIOGEL PI INDICATOR 7.0 (GLOVE) ×2
GLOVE ECLIPSE 6.5 STRL STRAW (GLOVE) ×1 IMPLANT
GLOVE SURG SS PI 7.0 STRL IVOR (GLOVE) ×3 IMPLANT
GOWN STRL REUS W/ TWL LRG LVL3 (GOWN DISPOSABLE) ×3 IMPLANT
GOWN STRL REUS W/ TWL XL LVL3 (GOWN DISPOSABLE) IMPLANT
GOWN STRL REUS W/TWL LRG LVL3 (GOWN DISPOSABLE) ×4
GOWN STRL REUS W/TWL XL LVL3 (GOWN DISPOSABLE) ×6
KIT BASIN OR (CUSTOM PROCEDURE TRAY) ×2 IMPLANT
KIT ROOM TURNOVER OR (KITS) ×2 IMPLANT
LIQUID BAND (GAUZE/BANDAGES/DRESSINGS) ×2 IMPLANT
LOOP VESSEL MINI RED (MISCELLANEOUS) IMPLANT
NDL HYPO 25GX1X1/2 BEV (NEEDLE) IMPLANT
NEEDLE HYPO 25GX1X1/2 BEV (NEEDLE) IMPLANT
NS IRRIG 1000ML POUR BTL (IV SOLUTION) ×4 IMPLANT
PACK CAROTID (CUSTOM PROCEDURE TRAY) ×2 IMPLANT
PAD ARMBOARD 7.5X6 YLW CONV (MISCELLANEOUS) ×4 IMPLANT
PATCH HEMASHIELD 8X75 (Vascular Products) ×1 IMPLANT
PROBE PENCIL 8 MHZ STRL DISP (MISCELLANEOUS) IMPLANT
SHUNT CAROTID BYPASS 10 (VASCULAR PRODUCTS) ×1 IMPLANT
SHUNT CAROTID BYPASS 12FRX15.5 (VASCULAR PRODUCTS) IMPLANT
SPONGE INTESTINAL PEANUT (DISPOSABLE) ×1 IMPLANT
SPONGE SURGIFOAM ABS GEL 100 (HEMOSTASIS) IMPLANT
SUT ETHILON 3 0 PS 1 (SUTURE) IMPLANT
SUT PROLENE 6 0 CC (SUTURE) ×3 IMPLANT
SUT PROLENE 7 0 BV 1 (SUTURE) IMPLANT
SUT PROLENE 7 0 BV1 MDA (SUTURE) ×1 IMPLANT
SUT SILK 3 0 TIES 17X18 (SUTURE)
SUT SILK 3-0 18XBRD TIE BLK (SUTURE) IMPLANT
SUT VIC AB 3-0 SH 27 (SUTURE) ×2
SUT VIC AB 3-0 SH 27X BRD (SUTURE) ×1 IMPLANT
SUT VICRYL 4-0 PS2 18IN ABS (SUTURE) ×2 IMPLANT
SYR CONTROL 10ML LL (SYRINGE) IMPLANT
SYRINGE 10CC LL (SYRINGE) ×1 IMPLANT
TRAY FOLEY CATH 16FRSI W/METER (SET/KITS/TRAYS/PACK) ×1 IMPLANT
WATER STERILE IRR 1000ML POUR (IV SOLUTION) ×2 IMPLANT

## 2014-04-18 NOTE — Progress Notes (Signed)
Pt resting . Low pain level. Cuff SBP drifting to 90's.  500 cc NS at 967ml/hr IV as ordered.

## 2014-04-18 NOTE — Anesthesia Preprocedure Evaluation (Addendum)
Anesthesia Evaluation  Patient identified by MRN, date of birth, ID band Patient awake    Reviewed: Allergy & Precautions, NPO status , Patient's Chart, lab work & pertinent test results  History of Anesthesia Complications Negative for: history of anesthetic complications  Airway Mallampati: II  TM Distance: >3 FB Neck ROM: Full    Dental  (+) Edentulous Upper, Dental Advisory Given, Poor Dentition   Pulmonary former smoker,    Pulmonary exam normal       Cardiovascular hypertension, Pt. on medications and Pt. on home beta blockers + angina + CAD, + Past MI, + Cardiac Stents and + Peripheral Vascular Disease  Echo 2013: Study Conclusions - Left ventricle: The cavity size was normal. Wall thickness was increased in a pattern of mild LVH. Systolic functionwas normal. The estimated ejection fraction was in therange of 55% to 60%. - Mitral valve: Calcified annulus. Mildly thickened leaflets . Mild regurgitation.    Neuro/Psych PSYCHIATRIC DISORDERS Anxiety Depression CVA, No Residual Symptoms    GI/Hepatic Neg liver ROS, GERD-  ,  Endo/Other  diabetes  Renal/GU negative Renal ROS     Musculoskeletal   Abdominal   Peds  Hematology   Anesthesia Other Findings   Reproductive/Obstetrics                            Anesthesia Physical Anesthesia Plan  ASA: III  Anesthesia Plan: General   Post-op Pain Management:    Induction: Intravenous  Airway Management Planned: Oral ETT  Additional Equipment:   Intra-op Plan:   Post-operative Plan: Extubation in OR  Informed Consent: I have reviewed the patients History and Physical, chart, labs and discussed the procedure including the risks, benefits and alternatives for the proposed anesthesia with the patient or authorized representative who has indicated his/her understanding and acceptance.   Dental advisory given  Plan Discussed  with: CRNA, Anesthesiologist and Surgeon  Anesthesia Plan Comments:        Anesthesia Quick Evaluation

## 2014-04-18 NOTE — Anesthesia Postprocedure Evaluation (Signed)
Anesthesia Post Note  Patient: Joanne Morris  Procedure(s) Performed: Procedure(s) (LRB): ENDARTERECTOMY RIGHT CAROTID (Right) PATCH ANGIOPLASTY USING HEMASHIELD 0.8cmx 7.6cm PATCH (Right)  Anesthesia type: general  Patient location: PACU  Post pain: Pain level controlled  Post assessment: Patient's Cardiovascular Status Stable  Last Vitals:  Filed Vitals:   04/18/14 1230  BP:   Pulse:   Temp: 36.7 C  Resp:     Post vital signs: Reviewed and stable  Level of consciousness: sedated  Complications: No apparent anesthesia complications

## 2014-04-18 NOTE — Progress Notes (Signed)
Chaplain responded to spiritual care consult that pt requested prayer. Pt asked chaplain to pray for her health and her family. Chaplain provided prayer. Pt said she was uncomfortable in her bed and chaplain offered to communicate with nurse to come help pt. Chaplain informed Financial controller of pt's request for assistance.  Joanne Morris 04/18/2014 2:13 PM

## 2014-04-18 NOTE — OR Nursing (Signed)
Foley Catheter placed for procedure at patient request (due to patient diuretic usage) with Dr. [name] order.

## 2014-04-18 NOTE — Progress Notes (Addendum)
  Day of Surgery Note    Subjective:  Very uncomfortable in the bed; states her incision is a little sore  Filed Vitals:   04/18/14 1311  BP: 122/53  Pulse: 55  Temp:   Resp: 13    Incisions:   C/d/i without hematoma Extremities:  Equal strength in upper and lower extremities bilaterally Lungs:  Non labored Neuro:  In tact  Assessment/Plan:  This is a 64 y.o. female who is s/p right CEA  -pt is neurologically intact -pt is uncomfortable in bed-i have spoken to the RN to help adjust her -incision is c/d/i without hematoma -anticipate discharge tomorrow if she has a good night   Leontine Locket, PA-C 04/18/2014 2:59 PM  No hematoma Neuro at baseline [place], MD [facility] [place]: [phone] Pager: [phone]

## 2014-04-18 NOTE — Transfer of Care (Signed)
Immediate Anesthesia Transfer of Care Note  Patient: Joanne Morris  Procedure(s) Performed: Procedure(s): ENDARTERECTOMY RIGHT CAROTID (Right) PATCH ANGIOPLASTY USING HEMASHIELD 0.8cmx 7.6cm PATCH (Right)  Patient Location: PACU  Anesthesia Type:General  Level of Consciousness: awake, alert , oriented and patient cooperative  Airway & Oxygen Therapy: Patient Spontanous Breathing and Patient connected to nasal cannula oxygen  Post-op Assessment: Report given to RN, Post -op Vital signs reviewed and stable, Patient moving all extremities and Patient able to stick tongue midline  Post vital signs: Reviewed and stable  Last Vitals:  Filed Vitals:   04/18/14 0604  BP: 128/67  Pulse: 68  Temp: 36.7 C  Resp: 20    Complications: No apparent anesthesia complications

## 2014-04-18 NOTE — Anesthesia Procedure Notes (Signed)
Procedure Name: Intubation Date/Time: 04/18/2014 7:46 AM Performed by: Julian Reil Pre-anesthesia Checklist: Patient identified, Emergency Drugs available, Patient being monitored, Suction available and Timeout performed Patient Re-evaluated:Patient Re-evaluated prior to inductionOxygen Delivery Method: Circle system utilized Preoxygenation: Pre-oxygenation with 100% oxygen Intubation Type: IV induction Ventilation: Oral airway inserted - appropriate to patient size and Mask ventilation without difficulty Laryngoscope Size: Mac and 3 Grade View: Grade II Tube type: Oral Tube size: 7.0 mm Number of attempts: 1 Airway Equipment and Method: Stylet,  LTA kit utilized and Oral airway Placement Confirmation: breath sounds checked- equal and bilateral,  ETT inserted through vocal cords under direct vision,  positive ETCO2 and CO2 detector Secured at: 21 cm Tube secured with: Tape Dental Injury: Teeth and Oropharynx as per pre-operative assessment

## 2014-04-18 NOTE — H&P (View-Only) (Signed)
[facility] [place] HISTORY AND PHYSICAL    History of Present Illness:  patient is a 64 year old female who returns for follow-up today after recent left carotid endarterectomy. She also has a known greater than 70% right internal carotid artery stenosis. The patient denies any recent symptoms of TIA amaurosis or stroke. She states she did have a stroke 8 years ago and was treated at [facility]. She does not recall what symptoms she had at that time.    She states that she has previously had a coronary stent placed in [facility]. She also complains of pain in her right leg with walking.   She previously has had angioplasty of her right peroneal artery by Dr. [name].  She had relief of her leg symptoms on the right side for approximately one month but states they are now worse again. She also complains of pain in her right foot. Some of the symptoms may be related to peripheral arterial disease but some also sound like degenerative joint changes. Other medical problems include diabetes, hypertension, elevated cholesterol.  These are all currently stable.  She is a former smoker but quit several years ago.  She is on Plavix and aspirin.    Past Medical History    Diagnosis   Date    .   Hypertension       .   Coronary artery disease       .   Angina       .   Depression       .   Claudication       .   PAD (peripheral artery disease)       .   High cholesterol       .   Myocardial infarction   1990's          "1"    .   Type II diabetes mellitus       .   GERD (gastroesophageal reflux disease)       .   Stroke             "mini stroke 1st then regular stroke", denies residual on 07/20/2013    .   Arthritis             "knees" (07/20/2013)    .   Anxiety           Past Surgical History    Procedure   Laterality   Date    .   Abdominal hysterectomy          .   Tubal ligation          .   Esophagogastroduodenoscopy   N/A   10/13/2012          Procedure:  ESOPHAGOGASTRODUODENOSCOPY (EGD);  Surgeon: Gatha Mayer, MD;  Location: [facility] ENDOSCOPY;  Service: Endoscopy;  Laterality: N/A;    .   Lower extremity angiogram      07/20/2013          Unsuccessful attempt at crossing the CTO/notes 07/20/2013    .   Coronary angioplasty with stent placement                "1"    .   Balloon angioplasty, artery      10/05/2013          DR [name]    .   Atherectomy      10/05/2013      Social History History  Substance Use Topics    .   Smoking status:   Former Smoker -- 0.50 packs/day for 4 years          Types:   Cigarettes          Quit date:   05/30/2009    .   Smokeless tobacco:   Never Used    .   Alcohol Use:   No      Family History No family history on file.  Allergies  No Known Allergies     Current Outpatient Prescriptions on File Prior to Visit  Medication Sig Dispense Refill  . ALPRAZolam (XANAX) 0.25 MG tablet Take 0.25 mg by mouth daily as needed for anxiety.    Marland Kitchen amLODipine (NORVASC) 10 MG tablet Take 10 mg by mouth daily.    Marland Kitchen aspirin EC 81 MG tablet Take 81 mg by mouth daily.    . cilostazol (PLETAL) 100 MG tablet Take 100 mg by mouth 2 (two) times daily.     . clopidogrel (PLAVIX) 75 MG tablet Take 1 tablet (75 mg total) by mouth daily. 30 tablet 3  . HYDROcodone-acetaminophen (NORCO/VICODIN) 5-325 MG per tablet Take 1 tablet by mouth every 6 (six) hours as needed for moderate pain.     Marland Kitchen lisinopril-hydrochlorothiazide (PRINZIDE,ZESTORETIC) 20-12.5 MG per tablet Take 1 tablet by mouth 2 (two) times daily.     . meloxicam (MOBIC) 7.5 MG tablet Take 7.5 mg by mouth daily as needed for pain.     . metFORMIN (GLUCOPHAGE) 500 MG tablet Take 1 tablet (500 mg total) by mouth daily with breakfast.    . niacin (NIASPAN) 1000 MG CR tablet Take 1 tablet by mouth daily.  6  . nitroGLYCERIN (NITROSTAT) 0.4 MG SL tablet Place 0.4 mg under the tongue every 5 (five) minutes as needed. For chest pain    . omeprazole (PRILOSEC) 20 MG capsule Take  20 mg by mouth daily.    . pravastatin (PRAVACHOL) 40 MG tablet Take 40 mg by mouth daily.    . traMADol (ULTRAM) 50 MG tablet Take 1 tablet (50 mg total) by mouth every 6 (six) hours as needed for moderate pain. 15 tablet 0  . zolpidem (AMBIEN) 5 MG tablet Take 5 mg by mouth at bedtime as needed for sleep.     No current facility-administered medications on file prior to visit.    ROS:    General:  No weight loss, Fever, chills  HEENT: No recent headaches, no nasal bleeding, no visual changes, no sore throat  Neurologic: No dizziness, blackouts, seizures. No recent symptoms of stroke or mini- stroke. No recent episodes of slurred speech, or temporary blindness.  Cardiac: + recent episodes of chest pain/pressure, no shortness of breath at rest.  No shortness of breath with exertion.  Denies history of atrial fibrillation or irregular heartbeat  Vascular: No history of rest pain in feet.  +  history of claudication.  No history of non-healing ulcer, No history of DVT    Pulmonary: No home oxygen, no productive cough, no hemoptysis,  No asthma or wheezing  Musculoskeletal:  [ ]  Arthritis, [ ]  Low back pain,  [ x] Joint pain  Hematologic:No history of hypercoagulable state.  No history of easy bleeding.  No history of anemia  Gastrointestinal: No hematochezia or melena,  No gastroesophageal reflux, no trouble swallowing  Urinary: [ ]  chronic Kidney disease, [ ]  on HD - [ ]  MWF or [ ]  TTHS, [ ]  Burning with  urination, [ ]  Frequent urination, [ ]  Difficulty urinating;    Skin: No rashes  Psychological: No history of anxiety,  No history of depression   Physical Examination     Filed Vitals:   03/31/14 1135 03/31/14 1137  BP: 161/75 157/74  Pulse: 80   Height: 4\' 11"  (1.499 m)   Weight: 126 lb 12.8 oz (57.516 kg)   SpO2: 100%    General:  Alert and oriented, no acute distress HEENT: Normal Neck: right side soft carotid bruit no left carotid bruit, well-healed left neck  incision Pulmonary: Clear to auscultation bilaterally Cardiac: Regular Rate and Rhythm with grade 2 to 3/6 systolic murmur heard throughout precordium Abdomen: Soft, non-tender, non-distended, no mass Skin: No rash Extremity Pulses:  2+ radial, brachial, femoral,absent dorsalis pedis, posterior tibial pulses bilaterally Musculoskeletal: No deformity or edema     Neurologic: Upper and lower extremity motor 5/5 and symmetric  DATA:  Carotid duplex scan dated October 30 from [facility] is reviewed today. This shows a high-grade greater than 80% stenosis of the left internal carotid artery with velocities of 365/122. This showed a moderate right internal carotid artery stenosis. There was antegrade vertebral flow. She had a repeat carotid duplex scan in our office a few weeks ago. However this only showed a 40-60% stenosis with much lower velocities although potentially underestimated due to calcific plaque.  She had a CT angiogram of her neck recently and I reviewed these images. This shows a moderate to high-grade stenosis of the right carotid bifurcation probably 60-80%.  She had a carotid duplex exam today which shows again 60-80% right internal carotid artery stenosis with velocities of 297/80 with no left carotid stenosis post endarterectomy   ASSESSMENT:  #1. Doing well status post left carotid endarterectomy #2 She will need right carotid endarterectomy at this point for stroke prophylaxis. We will stop her Plavix and Pletal on February 20. She will continue her aspirin. Right carotid endarterectomy scheduled for 04/12/2014. Risks benefits possible complications and procedure details were trying to the patient today including but not limited to bleeding infection stroke risk of 1-2% cranial nerve injury risk of 10-15%. She understands and agrees to proceed.   #2 right neck and chest pain. I reassured the patient that carotid disease is not cause neck pain.  She was recently seen by Dr.  Colan Neptune to be reasonable from a cardiac standpoint for carotid endarterectomy.                               #3 right leg pain most likely multifactorial.I would not consider any intervention for claudication symptoms alone for tibial disease whether this be with angioplasty stenting or operation. I believe that the risk of recurrence is high and believe the benefit is low. I advised her that if she has further questions regarding the angioplasty the Dr. [name] previously performed on her she could ask at her appointment.   PLAN:  see above  Ruta Hinds, MD [facility] [place] Office: [phone] Pager: [phone]

## 2014-04-18 NOTE — Op Note (Signed)
Procedure: Right carotid endarterectomy  Preoperative diagnosis: High-grade asymptomatic right internal carotid artery stenosis 75%  Postoperative diagnosis: Same  Anesthesia General  Asst.: Silva Bandy, [facility]  Operative findings: #1 75% right internal carotid stenosis                                                             #2 Dacron patch           #3 10 Fr shunt  Operative details: After obtaining informed consent, the patient was taken to the operating room. The patient was placed in a supine position on the operating room table. After induction of general anesthesia, the patient's entire neck and chest was prepped and draped in the usual sterile fashion. An oblique incision was made on the right aspect of the patient's neck anterior to the border the right sternocleidomastoid muscle. The incision was carried into the subcutaneous tissues and through the platysma. The sternocleidomastoid muscle was identified and reflected laterally. The omohyoid muscle was identified and this was divided with cautery. The common carotid artery was then found at the base of the incision this was dissected free circumferentially. It was fairly soft on palpation.  The vagus nerve was identified and protected. Dissection was then carried up to the level carotid bifurcation.   The hyperglossal nerve was just above the primary area of dissection. Several tethering vein and arterial branches were ligated and divided between silk ties in order to mobilize the nerve slightly.  The internal carotid artery was dissected free circumferentially just below the level of the hypoglossal nerve and it was soft in character at this location and above any palpable disease. A vessel loop was placed around this. Next the external carotid and superior thyroid arteries were dissected free circumferentially and vessel loops were placed around these. The patient was given 6000 units of intravenous heparin.  After 2 minutes of circulation  time and raising the mean arterial pressure to 90 mm mercury, the distal internal carotid artery was controlled with small bulldog clamp. The external carotid and superior thyroid arteries were controlled with vessel loops. The common carotid artery was controlled with a peripheral DeBakey clamp. A longitudinal opening was made in the common carotid artery just below the bifurcation. The arteriotomy was extended distally up into the internal carotid with Potts scissors. There was a large calcified plaque with at least 75% stenosis in the internal carotid with some ulceration.  A 10 Fr shunt was brought onto the field and fashioned to fit the patient's artery.  This was threaded into the distal internal carotid artery and allowed to backbleed thoroughly.  There was good but not pulsatile backbleeding.  This was then threaded into the common carotid and secured with a Rummel tourniquet.   There was no air at this point and flow was restored to the brain.  Attention was then turned to the common carotid artery once again. A suitable endarterectomy plane was obtained and endarterectomy was begun in the common carotid artery and a good proximal endpoint was obtained. An eversion endarterectomy was performed on the external carotid artery and a good endpoint was obtained. The plaque was then elevated in the internal carotid artery and a nice feathered distal endpoint was also obtained.  The plaque was passed off the table. All  loose debris was then removed from the carotid bed and everything was thoroughly irrigated with heparinized saline. The posterior wall of the endpoint was lifting slightly so this was tacked with several interrupted 7 0 prolene sutures.  A Dacron patch was then brought on to the operative field and this was sewn on as a patch angioplasty using a running 6-0 Prolene suture. Prior to completion of the anastomosis the internal carotid artery was thoroughly backbled. This was then controlled again with  a fine bulldog clamp.  The common carotid was thoroughly flushed forward. The external carotid was also thoroughly backbled.  The remainder of the patch was completed and the anastomosis was secured. Flow was then restored first retrograde from the external carotid into the carotid bed then antegrade from the common carotid to the external carotid artery and after approximately 5 cardiac cycles to the internal carotid artery. Doppler was used to evaluate the external/internal and common carotid arteries and these all had good Doppler flow. Hemostasis was obtained with direct pressure and 60 mg of Protamine.      The platysma muscle was reapproximated using a running 3-0 Vicryl suture. The skin was closed with 4 0 Vicryl subcuticular stitch.  The patient was awakened in the operating room and was moving upper and lower extremities symmetrically and following commands.  The patient was stable on arrival to the PACU.  Ruta Hinds, MD [facility] [place] Office: [phone] Pager: [phone]

## 2014-04-18 NOTE — Interval H&P Note (Signed)
History and Physical Interval Note:  04/18/2014 7:29 AM  Joanne Morris  has presented today for surgery, with the diagnosis of Right Internal Carotid Artery Stenosis I65.21  The various methods of treatment have been discussed with the patient and family. After consideration of risks, benefits and other options for treatment, the patient has consented to  Procedure(s): ENDARTERECTOMY CAROTID (Right) as a surgical intervention .  The patient's history has been reviewed, patient examined, no change in status, stable for surgery.  I have reviewed the patient's chart and labs.  Questions were answered to the patient's satisfaction.     Chrystian Ressler E

## 2014-04-19 ENCOUNTER — Telehealth: Payer: Self-pay | Admitting: Vascular Surgery

## 2014-04-19 ENCOUNTER — Encounter (HOSPITAL_COMMUNITY): Payer: Self-pay | Admitting: Vascular Surgery

## 2014-04-19 LAB — BASIC METABOLIC PANEL
Anion gap: 4 — ABNORMAL LOW (ref 5–15)
BUN: 10 mg/dL (ref 6–23)
CALCIUM: 9.5 mg/dL (ref 8.4–10.5)
CO2: 34 mmol/L — ABNORMAL HIGH (ref 19–32)
CREATININE: 0.95 mg/dL (ref 0.50–1.10)
Chloride: 102 mmol/L (ref 96–112)
GFR calc Af Amer: 72 mL/min — ABNORMAL LOW (ref >90)
GFR calc non Af Amer: 62 mL/min — ABNORMAL LOW (ref >90)
GLUCOSE: 112 mg/dL — AB (ref 70–99)
Potassium: 3.9 mmol/L (ref 3.5–5.1)
Sodium: 140 mmol/L (ref 135–145)

## 2014-04-19 LAB — CBC
HEMATOCRIT: 29.3 % — AB (ref 36.0–46.0)
Hemoglobin: 9.4 g/dL — ABNORMAL LOW (ref 12.0–15.0)
MCH: 24.5 pg — ABNORMAL LOW (ref 26.0–34.0)
MCHC: 32.1 g/dL (ref 30.0–36.0)
MCV: 76.5 fL — AB (ref 78.0–100.0)
Platelets: 238 10*3/uL (ref 150–400)
RBC: 3.83 MIL/uL — AB (ref 3.87–5.11)
RDW: 15.5 % (ref 11.5–15.5)
WBC: 9.5 10*3/uL (ref 4.0–10.5)

## 2014-04-19 LAB — GLUCOSE, CAPILLARY
GLUCOSE-CAPILLARY: 139 mg/dL — AB (ref 70–99)
Glucose-Capillary: 149 mg/dL — ABNORMAL HIGH (ref 70–99)

## 2014-04-19 MED ORDER — HYDROCODONE-ACETAMINOPHEN 5-325 MG PO TABS: 1.0000 | ORAL_TABLET | Freq: Four times a day (QID) | ORAL | Status: DC | PRN

## 2014-04-19 MED ORDER — POLYETHYLENE GLYCOL 3350 17 G PO PACK
17.0000 g | PACK | Freq: Once | ORAL | Status: AC
  Administered 2014-04-19: 17 g via ORAL
  Filled 2014-04-19 (×2): qty 1

## 2014-04-19 NOTE — Telephone Encounter (Signed)
-----   Message from Denman George, RN sent at 04/18/2014 10:52 AM EST ----- Regarding: needs 2 wk. f/u with [facility]   ----- Message -----    From: Alvia Grove, PA-C    Sent: 04/18/2014   9:55 AM      To: Vvs Charge Pool  S/p right CEA 04/18/14  F/u with Dr. [name] in 2 weeks  Thanks Maudie Mercury

## 2014-04-19 NOTE — Progress Notes (Addendum)
  [facility] Progress Note  04/19/2014 7:26 AM 1 Day Post-Op  Subjective:  Had a hard time sleeping last night. Incision is sore. Feels tired. Intermittent pain on dorsal aspect of left foot.   Filed Vitals:   04/19/14 0330  BP: 122/70  Pulse: 64  Temp: 98.6 F (37 C)  Resp: 12     Physical Exam: Neuro:  Smile symmetric. Tongue midline. 5/5 strength upper and lower extremities b/l Incision:  Right incision clean, dry and intact. Very mild swelling. Right neck is soft. No hematoma. No erythema  CBC    Component Value Date/Time   WBC 9.5 04/19/2014 0416   RBC 3.83* 04/19/2014 0416   RBC 4.03 06/01/2011 1416   HGB 9.4* 04/19/2014 0416   HCT 29.3* 04/19/2014 0416   PLT 238 04/19/2014 0416   MCV 76.5* 04/19/2014 0416   MCH 24.5* 04/19/2014 0416   MCHC 32.1 04/19/2014 0416   RDW 15.5 04/19/2014 0416   LYMPHSABS 4.5* 10/02/2010 1054   MONOABS 0.5 10/02/2010 1054   EOSABS 0.1 10/02/2010 1054   BASOSABS 0.0 10/02/2010 1054    BMET    Component Value Date/Time   NA 140 04/19/2014 0416   K 3.9 04/19/2014 0416   CL 102 04/19/2014 0416   CO2 34* 04/19/2014 0416   GLUCOSE 112* 04/19/2014 0416   BUN 10 04/19/2014 0416   CREATININE 0.95 04/19/2014 0416   CALCIUM 9.5 04/19/2014 0416   GFRNONAA 62* 04/19/2014 0416   GFRAA 72* 04/19/2014 0416     Intake/Output Summary (Last 24 hours) at 04/19/14 0726 Last data filed at 04/19/14 [id number]  Gross per 24 hour  Intake   2290 ml  Output   2650 ml  Net   -360 ml      Assessment/Plan:  This is a 64 y.o. female who is s/p right CEA 1 Day Post-Op  -Neuro exam is intact -No hematoma right neck -Acute surgical blood loss anemia: Hgb stable -BP stable this am.  -Has not ambulated, mobilize.  -Has voided.  -Dispo: Discharge home today. On ASA and plavix. Will have her follow up with Dr. [name] about her peripheral vascular disease. Daughter will be available to pick her up after 1700.    Virgina Jock,  PA-C [facility] Office: [phone] Pager: [phone] 04/19/2014 7:26 AM

## 2014-04-19 NOTE — Telephone Encounter (Signed)
LM for pt re appt, dpm °

## 2014-04-19 NOTE — Progress Notes (Signed)
UR COMPLETED  

## 2014-04-19 NOTE — Progress Notes (Signed)
Reviewed patients discharge instructions answered all questions at this time.  Pt. VSS with no s/s of distress noted.  Pt. Stable at discharge.

## 2014-04-19 NOTE — Care Management Note (Unsigned)
    Page 1 of 1   04/19/2014     11:34:15 AM CARE MANAGEMENT NOTE 04/19/2014  Patient:  Joanne Morris, Joanne Morris   Account Number:  [id number]  Date Initiated:  04/19/2014  Documentation initiated by:  Whitman Hero  Subjective/Objective Assessment:   PTA from home, s/p  (r) carotid endarterctomy.     Action/Plan:   Return to home when medically stable.   Anticipated DC Date:  04/19/2014   Anticipated DC Plan:  HOME/SELF CARE         Choice offered to / List presented to:             Status of service:  Completed, signed off Medicare Important Message given?  NA - LOS <3 / Initial given by admissions (If response is "NO", the following Medicare IM given date fields will be blank) Date Medicare IM given:   Medicare IM given by:   Date Additional Medicare IM given:   Additional Medicare IM given by:    Discharge Disposition:  HOME/SELF CARE  Per UR Regulation:  Reviewed for med. necessity/level of care/duration of stay  If discussed at [place] of Stay Meetings, dates discussed:    Comments:

## 2014-04-20 NOTE — Discharge Summary (Signed)
[facility] Discharge Summary  Joanne Morris 1950/07/07 64 y.o. female  [id number]  Admission Date: 04/18/2014  Discharge Date: 04/19/2014  Physician: Ruta Hinds, MD  Admission Diagnosis: Right Internal Carotid Artery Stenosis I65.21  HPI:   This is a 64 y.o. female who returned for follow-up on 03/31/14 after recent left carotid endarterectomy. She also has a known greater than 70% right internal carotid artery stenosis. The patient denies any recent symptoms of TIA amaurosis or stroke. She states she did have a stroke 8 years ago and was treated at [facility]. She does not recall what symptoms she had at that time.    She states that she has previously had a coronary stent placed in [facility]. She also complains of pain in her right leg with walking.   She previously has had angioplasty of her right peroneal artery by Dr. [name]. She had relief of her leg symptoms on the right side for approximately one month but states they are now worse again. She also complains of pain in her right foot. Some of the symptoms may be related to peripheral arterial disease but some also sound like degenerative joint changes. Other medical problems include diabetes, hypertension, elevated cholesterol. These are all currently stable. She is a former smoker but quit several years ago. She is on Plavix and aspirin.  Hospital Course:  The patient was admitted to the hospital and taken to the operating room on 04/18/2014 and underwent right carotid endarterectomy.  The patient tolerated the procedure well and was transported to the PACU in stable condition.  By POD 1, the patient's neuro status was intact. Her neck incision was clean and intact without evidence of a hematoma. Her blood pressure was stable and acute blood loss anemia was stable. She was mobilizing and voiding without difficulty and pain adequately controlled. She did complain of intermittent pain in her left foot. Her  foot was warm without evidence of ischemic changes. She was told to follow up with Dr. [name] who has been seeing her for peripheral vascular disease. She was discharged home on POD 1 in good condition.     Recent Labs  04/19/14 0416  NA 140  K 3.9  CL 102  CO2 34*  GLUCOSE 112*  BUN 10  CALCIUM 9.5    Recent Labs  04/18/14 1553 04/19/14 0416  WBC 9.8 9.5  HGB 9.4* 9.4*  HCT 28.5* 29.3*  PLT 243 238   No results for input(s): INR in the last 72 hours.  Discharge Instructions:   The patient is discharged to home with extensive instructions on wound care and progressive ambulation.  They are instructed not to drive or perform any heavy lifting until returning to see the physician in his office.  Discharge Instructions    CAROTID Sugery: Call MD for difficulty swallowing or speaking; weakness in arms or legs that is a new symtom; severe headache.  If you have increased swelling in the neck and/or  are having difficulty breathing, CALL 911    Complete by:  As directed      Call MD for:  redness, tenderness, or signs of infection (pain, swelling, bleeding, redness, odor or green/yellow discharge around incision site)    Complete by:  As directed      Call MD for:  severe or increased pain, loss or decreased feeling  in affected limb(s)    Complete by:  As directed      Call MD for:  temperature >100.5  Complete by:  As directed      Discharge wound care:    Complete by:  As directed   May shower tomorrow. Then wash wound daily with soap and water and pat dry.     Driving Restrictions    Complete by:  As directed   No driving for 2 weeks     Increase activity slowly    Complete by:  As directed   Walk with assistance use walker or cane as needed     Lifting restrictions    Complete by:  As directed   No lifting for 2 weeks     Resume previous diet    Complete by:  As directed            Discharge Diagnosis:  Right Internal Carotid Artery Stenosis  I65.21  Secondary Diagnosis: Patient Active Problem List   Diagnosis Date Noted  . Carotid stenosis, asymptomatic 04/18/2014  . Carotid artery stenosis, asymptomatic 02/07/2014  . PAD (peripheral artery disease) 07/20/2013  . Peripheral arterial disease 06/08/2013  . Abdominal pain 10/11/2012  . Dyslipidemia 10/11/2012  . Diabetes mellitus 10/11/2012  . Depression 10/11/2012  . Radiculopathy of leg 10/11/2012  . Atypical chest pain 05/31/2011  . CAD (coronary artery disease) 05/31/2011  . Hypokalemia 05/31/2011  . HTN (hypertension) 05/31/2011  . Anemia 05/31/2011  . Leucocytosis 05/31/2011  . DM2 (diabetes mellitus, type 2) 05/31/2011  . Hyperlipemia 05/31/2011   Past Medical History  Diagnosis Date  . Hypertension   . Coronary artery disease   . Angina   . Depression   . Claudication   . PAD (peripheral artery disease)   . High cholesterol   . Myocardial infarction 1990's    "1"  . Type II diabetes mellitus   . GERD (gastroesophageal reflux disease)   . Stroke     "mini stroke 1st then regular stroke", denies residual on 07/20/2013  . Arthritis     "knees" (07/20/2013)  . Anxiety   . Carotid stenosis   . Carotid stenosis   . Wears glasses       Medication List    TAKE these medications        ALPRAZolam 0.25 MG tablet  Commonly known as:  XANAX  Take 0.25 mg by mouth daily as needed for anxiety.     amLODipine 10 MG tablet  Commonly known as:  NORVASC  Take 10 mg by mouth daily.     aspirin EC 81 MG tablet  Take 81 mg by mouth daily.     cilostazol 100 MG tablet  Commonly known as:  PLETAL  Take 100 mg by mouth 2 (two) times daily.     clopidogrel 75 MG tablet  Commonly known as:  PLAVIX  Take 1 tablet (75 mg total) by mouth daily.     HYDROcodone-acetaminophen 5-325 MG per tablet  Commonly known as:  NORCO/VICODIN  Take 1 tablet by mouth every 6 (six) hours as needed for moderate pain.     lisinopril-hydrochlorothiazide 20-12.5 MG per tablet   Commonly known as:  PRINZIDE,ZESTORETIC  Take 1 tablet by mouth 2 (two) times daily.     meloxicam 7.5 MG tablet  Commonly known as:  MOBIC  Take 7.5 mg by mouth daily as needed for pain.     metFORMIN 500 MG tablet  Commonly known as:  GLUCOPHAGE  Take 1 tablet (500 mg total) by mouth daily with breakfast.     metoprolol 50 MG tablet  Commonly known as:  LOPRESSOR  Take 50 mg by mouth 2 (two) times daily.     niacin 1000 MG CR tablet  Commonly known as:  NIASPAN  Take 1 tablet by mouth daily.     nitroGLYCERIN 0.4 MG SL tablet  Commonly known as:  NITROSTAT  Place 0.4 mg under the tongue every 5 (five) minutes as needed for chest pain.     omeprazole 20 MG capsule  Commonly known as:  PRILOSEC  Take 20 mg by mouth daily.     pravastatin 40 MG tablet  Commonly known as:  PRAVACHOL  Take 40 mg by mouth daily.     tiZANidine 4 MG tablet  Commonly known as:  ZANAFLEX  Take 4 mg by mouth at bedtime as needed for muscle spasms.     traMADol 50 MG tablet  Commonly known as:  ULTRAM  Take 1 tablet (50 mg total) by mouth every 6 (six) hours as needed for moderate pain.     zolpidem 5 MG tablet  Commonly known as:  AMBIEN  Take 5 mg by mouth at bedtime as needed for sleep.        Vicodin #20 No Refill  Disposition: Home  Patient's condition: is Good  Follow up: 1. Dr.  [name] in 2 weeks.   Virgina Jock, PA-C [facility] [phone]  --- For [facility] use --- Instructions: Press F2 to tab through selections.  Delete question if not applicable.   Modified Rankin score at D/C (0-6): 0  IV medication needed for:  1. Hypertension: No 2. Hypotension: No  Post-op Complications: No  1. Post-op CVA or TIA: No  2. CN injury: No  3. Myocardial infarction: No  4.  CHF: No  5.  Dysrhythmia (new): No  6. Wound infection: No  7. Reperfusion symptoms: No  8. Return to OR: No  Discharge medications: Statin use:  Yes If No: [ ]   For Medical reasons, [ ]  Non-compliant, [ ]  Not-indicated ASA use:  Yes  If No: [ ]  For Medical reasons, [ ]  Non-compliant, [ ]  Not-indicated Beta blocker use:  Yes If No: [ ]  For Medical reasons, [ ]  Non-compliant, [ ]  Not-indicated ACE-Inhibitor use:  Yes If No: [ ]  For Medical reasons, [ ]  Non-compliant, [ ]  Not-indicated P2Y12 Antagonist use: Yes, [x]  Plavix, [ ]  Plasugrel, [ ]  Ticlopinine, [ ]  Ticagrelor, [ ]  Other, [ ]  No for medical reason, [ ]  Non-compliant, [ ]  Not-indicated Anti-coagulant use:  No, [ ]  Warfarin, [ ]  Rivaroxaban, [ ]  Dabigatran, [ ]  Other, [ ]  No for medical reason, [ ]  Non-compliant, [x]  Not-indicated

## 2014-04-22 DIAGNOSIS — E119 Type 2 diabetes mellitus without complications: Secondary | ICD-10-CM | POA: Diagnosis not present

## 2014-04-23 ENCOUNTER — Encounter (HOSPITAL_COMMUNITY): Payer: Self-pay | Admitting: Emergency Medicine

## 2014-04-23 ENCOUNTER — Inpatient Hospital Stay (HOSPITAL_COMMUNITY)
Admission: EM | Admit: 2014-04-23 | Discharge: 2014-04-27 | DRG: 909 | Disposition: A | Discharge status: Home / Self Care | Payer: Medicare Other | Attending: Vascular Surgery | Admitting: Vascular Surgery

## 2014-04-23 DIAGNOSIS — I97618 Postprocedural hemorrhage and hematoma of a circulatory system organ or structure following other circulatory system procedure: Secondary | ICD-10-CM | POA: Diagnosis not present

## 2014-04-23 DIAGNOSIS — S1093XA Contusion of unspecified part of neck, initial encounter: Secondary | ICD-10-CM

## 2014-04-23 DIAGNOSIS — Z794 Long term (current) use of insulin: Secondary | ICD-10-CM | POA: Diagnosis not present

## 2014-04-23 DIAGNOSIS — E780 Pure hypercholesterolemia: Secondary | ICD-10-CM | POA: Diagnosis present

## 2014-04-23 DIAGNOSIS — M542 Cervicalgia: Secondary | ICD-10-CM | POA: Diagnosis not present

## 2014-04-23 DIAGNOSIS — Z90710 Acquired absence of both cervix and uterus: Secondary | ICD-10-CM

## 2014-04-23 DIAGNOSIS — R221 Localized swelling, mass and lump, neck: Secondary | ICD-10-CM | POA: Diagnosis not present

## 2014-04-23 DIAGNOSIS — I739 Peripheral vascular disease, unspecified: Secondary | ICD-10-CM | POA: Diagnosis present

## 2014-04-23 DIAGNOSIS — Z419 Encounter for procedure for purposes other than remedying health state, unspecified: Secondary | ICD-10-CM

## 2014-04-23 DIAGNOSIS — Z79899 Other long term (current) drug therapy: Secondary | ICD-10-CM | POA: Diagnosis not present

## 2014-04-23 DIAGNOSIS — I10 Essential (primary) hypertension: Secondary | ICD-10-CM | POA: Diagnosis not present

## 2014-04-23 DIAGNOSIS — Z7902 Long term (current) use of antithrombotics/antiplatelets: Secondary | ICD-10-CM | POA: Diagnosis not present

## 2014-04-23 DIAGNOSIS — M1990 Unspecified osteoarthritis, unspecified site: Secondary | ICD-10-CM | POA: Diagnosis present

## 2014-04-23 DIAGNOSIS — Z8673 Personal history of transient ischemic attack (TIA), and cerebral infarction without residual deficits: Secondary | ICD-10-CM

## 2014-04-23 DIAGNOSIS — I252 Old myocardial infarction: Secondary | ICD-10-CM | POA: Diagnosis not present

## 2014-04-23 DIAGNOSIS — T814XXA Infection following a procedure, initial encounter: Secondary | ICD-10-CM | POA: Diagnosis not present

## 2014-04-23 DIAGNOSIS — Z0389 Encounter for observation for other suspected diseases and conditions ruled out: Secondary | ICD-10-CM | POA: Diagnosis not present

## 2014-04-23 DIAGNOSIS — Z87891 Personal history of nicotine dependence: Secondary | ICD-10-CM

## 2014-04-23 DIAGNOSIS — K219 Gastro-esophageal reflux disease without esophagitis: Secondary | ICD-10-CM | POA: Diagnosis not present

## 2014-04-23 DIAGNOSIS — I9762 Postprocedural hemorrhage and hematoma of a circulatory system organ or structure following other procedure: Secondary | ICD-10-CM | POA: Diagnosis not present

## 2014-04-23 DIAGNOSIS — E119 Type 2 diabetes mellitus without complications: Secondary | ICD-10-CM | POA: Diagnosis not present

## 2014-04-23 DIAGNOSIS — I2510 Atherosclerotic heart disease of native coronary artery without angina pectoris: Secondary | ICD-10-CM | POA: Diagnosis present

## 2014-04-23 DIAGNOSIS — F419 Anxiety disorder, unspecified: Secondary | ICD-10-CM | POA: Diagnosis not present

## 2014-04-23 DIAGNOSIS — S1083XA Contusion of other specified part of neck, initial encounter: Secondary | ICD-10-CM | POA: Diagnosis not present

## 2014-04-23 DIAGNOSIS — R070 Pain in throat: Secondary | ICD-10-CM | POA: Diagnosis not present

## 2014-04-23 DIAGNOSIS — Z7982 Long term (current) use of aspirin: Secondary | ICD-10-CM

## 2014-04-23 DIAGNOSIS — L0211 Cutaneous abscess of neck: Secondary | ICD-10-CM | POA: Diagnosis not present

## 2014-04-23 HISTORY — DX: Contusion of unspecified part of neck, initial encounter: S10.93XA

## 2014-04-23 LAB — CBG MONITORING, ED: Glucose-Capillary: 145 mg/dL — ABNORMAL HIGH (ref 70–99)

## 2014-04-23 LAB — GLUCOSE, CAPILLARY: Glucose-Capillary: 180 mg/dL — ABNORMAL HIGH (ref 70–99)

## 2014-04-23 MED ORDER — ALPRAZOLAM 0.25 MG PO TABS: 0.2500 mg | ORAL_TABLET | Freq: Every day | ORAL | Status: DC | PRN

## 2014-04-23 MED ORDER — GUAIFENESIN-DM 100-10 MG/5ML PO SYRP: 15.0000 mL | ORAL_SOLUTION | ORAL | Status: DC | PRN

## 2014-04-23 MED ORDER — POTASSIUM CHLORIDE CRYS ER 20 MEQ PO TBCR
20.0000 meq | EXTENDED_RELEASE_TABLET | Freq: Once | ORAL | Status: DC
  Filled 2014-04-23: qty 2

## 2014-04-23 MED ORDER — ACETAMINOPHEN 650 MG RE SUPP
325.0000 mg | RECTAL | Status: DC | PRN
Start: 2014-04-23 — End: 2014-04-27

## 2014-04-23 MED ORDER — MORPHINE SULFATE 2 MG/ML IJ SOLN: 2.0000 mg | INTRAMUSCULAR | Status: DC | PRN

## 2014-04-23 MED ORDER — ZOLPIDEM TARTRATE 5 MG PO TABS
5.0000 mg | ORAL_TABLET | Freq: Every evening | ORAL | Status: DC | PRN
  Administered 2014-04-23 – 2014-04-26 (×4): 5 mg via ORAL
  Filled 2014-04-23 (×4): qty 1

## 2014-04-23 MED ORDER — PANTOPRAZOLE SODIUM 40 MG PO TBEC
40.0000 mg | DELAYED_RELEASE_TABLET | Freq: Every day | ORAL | Status: DC
  Administered 2014-04-23 – 2014-04-27 (×5): 40 mg via ORAL
  Filled 2014-04-23 (×4): qty 1

## 2014-04-23 MED ORDER — ALUM & MAG HYDROXIDE-SIMETH 200-200-20 MG/5ML PO SUSP: 15.0000 mL | ORAL | Status: DC | PRN

## 2014-04-23 MED ORDER — HYDRALAZINE HCL 20 MG/ML IJ SOLN: 5.0000 mg | INTRAMUSCULAR | Status: DC | PRN

## 2014-04-23 MED ORDER — MAGNESIUM HYDROXIDE 400 MG/5ML PO SUSP
30.0000 mL | Freq: Every day | ORAL | Status: DC | PRN
  Administered 2014-04-26: 30 mL via ORAL
  Filled 2014-04-23 (×2): qty 30

## 2014-04-23 MED ORDER — SODIUM CHLORIDE 0.9 % IV SOLN: 250.0000 mL | INTRAVENOUS | Status: DC | PRN

## 2014-04-23 MED ORDER — PHENOL 1.4 % MT LIQD: 1.0000 | OROMUCOSAL | Status: DC | PRN

## 2014-04-23 MED ORDER — LISINOPRIL 20 MG PO TABS
20.0000 mg | ORAL_TABLET | Freq: Two times a day (BID) | ORAL | Status: DC
  Administered 2014-04-24 – 2014-04-27 (×7): 20 mg via ORAL
  Filled 2014-04-23 (×8): qty 1

## 2014-04-23 MED ORDER — METOPROLOL TARTRATE 1 MG/ML IV SOLN: 2.0000 mg | INTRAVENOUS | Status: DC | PRN

## 2014-04-23 MED ORDER — BISACODYL 10 MG RE SUPP
10.0000 mg | Freq: Once | RECTAL | Status: AC
  Administered 2014-04-23: 10 mg via RECTAL
  Filled 2014-04-23: qty 1

## 2014-04-23 MED ORDER — NITROGLYCERIN 0.4 MG SL SUBL: 0.4000 mg | SUBLINGUAL_TABLET | SUBLINGUAL | Status: DC | PRN

## 2014-04-23 MED ORDER — ONDANSETRON HCL 4 MG/2ML IJ SOLN: 4.0000 mg | Freq: Four times a day (QID) | INTRAMUSCULAR | Status: DC | PRN

## 2014-04-23 MED ORDER — PRAVASTATIN SODIUM 40 MG PO TABS
40.0000 mg | ORAL_TABLET | Freq: Every day | ORAL | Status: DC
  Administered 2014-04-23 – 2014-04-27 (×5): 40 mg via ORAL
  Filled 2014-04-23 (×5): qty 1

## 2014-04-23 MED ORDER — CEFUROXIME SODIUM 1.5 G IJ SOLR
1.5000 g | INTRAMUSCULAR | Status: AC
  Filled 2014-04-23: qty 1.5

## 2014-04-23 MED ORDER — LISINOPRIL-HYDROCHLOROTHIAZIDE 20-12.5 MG PO TABS: 1.0000 | ORAL_TABLET | Freq: Two times a day (BID) | ORAL | Status: DC

## 2014-04-23 MED ORDER — METFORMIN HCL 500 MG PO TABS
500.0000 mg | ORAL_TABLET | Freq: Every day | ORAL | Status: DC
  Administered 2014-04-25 – 2014-04-27 (×3): 500 mg via ORAL
  Filled 2014-04-23 (×5): qty 1

## 2014-04-23 MED ORDER — SODIUM CHLORIDE 0.9 % IJ SOLN
3.0000 mL | Freq: Two times a day (BID) | INTRAMUSCULAR | Status: DC
  Administered 2014-04-23 – 2014-04-27 (×3): 3 mL via INTRAVENOUS

## 2014-04-23 MED ORDER — AMLODIPINE BESYLATE 10 MG PO TABS
10.0000 mg | ORAL_TABLET | Freq: Every day | ORAL | Status: DC
  Administered 2014-04-23 – 2014-04-27 (×5): 10 mg via ORAL
  Filled 2014-04-23 (×2): qty 1
  Filled 2014-04-23: qty 2
  Filled 2014-04-23 (×2): qty 1

## 2014-04-23 MED ORDER — ACETAMINOPHEN 325 MG PO TABS
325.0000 mg | ORAL_TABLET | ORAL | Status: DC | PRN
  Administered 2014-04-23 – 2014-04-24 (×2): 650 mg via ORAL
  Filled 2014-04-23 (×2): qty 2

## 2014-04-23 MED ORDER — LABETALOL HCL 5 MG/ML IV SOLN
10.0000 mg | INTRAVENOUS | Status: DC | PRN
  Filled 2014-04-23: qty 4

## 2014-04-23 MED ORDER — DOCUSATE SODIUM 100 MG PO CAPS
100.0000 mg | ORAL_CAPSULE | Freq: Two times a day (BID) | ORAL | Status: DC
  Administered 2014-04-23 – 2014-04-27 (×6): 100 mg via ORAL
  Filled 2014-04-23 (×11): qty 1

## 2014-04-23 MED ORDER — HYDROCHLOROTHIAZIDE 12.5 MG PO CAPS
12.5000 mg | ORAL_CAPSULE | Freq: Two times a day (BID) | ORAL | Status: DC
  Administered 2014-04-24 – 2014-04-27 (×7): 12.5 mg via ORAL
  Filled 2014-04-23 (×8): qty 1

## 2014-04-23 MED ORDER — SODIUM CHLORIDE 0.9 % IV BOLUS (SEPSIS)
1000.0000 mL | Freq: Once | INTRAVENOUS | Status: AC
  Administered 2014-04-23: 1000 mL via INTRAVENOUS

## 2014-04-23 MED ORDER — TIZANIDINE HCL 4 MG PO TABS
4.0000 mg | ORAL_TABLET | Freq: Every evening | ORAL | Status: DC | PRN
  Filled 2014-04-23: qty 1

## 2014-04-23 MED ORDER — INSULIN ASPART 100 UNIT/ML ~~LOC~~ SOLN
0.0000 [IU] | Freq: Three times a day (TID) | SUBCUTANEOUS | Status: DC
  Administered 2014-04-23 – 2014-04-24 (×2): 2 [IU] via SUBCUTANEOUS
  Administered 2014-04-24: 3 [IU] via SUBCUTANEOUS
  Administered 2014-04-26 (×2): 2 [IU] via SUBCUTANEOUS
  Filled 2014-04-23: qty 1

## 2014-04-23 MED ORDER — METOPROLOL TARTRATE 50 MG PO TABS
50.0000 mg | ORAL_TABLET | Freq: Two times a day (BID) | ORAL | Status: DC
  Administered 2014-04-23 – 2014-04-27 (×8): 50 mg via ORAL
  Filled 2014-04-23 (×10): qty 1

## 2014-04-23 MED ORDER — SODIUM CHLORIDE 0.9 % IJ SOLN
3.0000 mL | INTRAMUSCULAR | Status: DC | PRN
Start: 2014-04-23 — End: 2014-04-27
  Administered 2014-04-26: 3 mL via INTRAVENOUS
  Filled 2014-04-23: qty 3

## 2014-04-23 MED ORDER — OXYCODONE HCL 5 MG PO TABS
5.0000 mg | ORAL_TABLET | ORAL | Status: DC | PRN
  Administered 2014-04-24: 10 mg via ORAL
  Administered 2014-04-26: 5 mg via ORAL
  Administered 2014-04-26 (×2): 10 mg via ORAL
  Filled 2014-04-23: qty 1
  Filled 2014-04-23 (×3): qty 2

## 2014-04-23 MED ORDER — ASPIRIN EC 81 MG PO TBEC
81.0000 mg | DELAYED_RELEASE_TABLET | Freq: Every day | ORAL | Status: DC
  Administered 2014-04-23 – 2014-04-27 (×5): 81 mg via ORAL
  Filled 2014-04-23 (×5): qty 1

## 2014-04-23 NOTE — ED Notes (Signed)
Spoke with pharmacy unable to obtain medication in the pyxis. Stated pharmacy will send all three medication via tube system.

## 2014-04-23 NOTE — ED Notes (Signed)
Vascular Surgeon at bedside. 

## 2014-04-23 NOTE — ED Provider Notes (Signed)
CSN: [id number]     Arrival date & time 04/23/14  39 History   First MD Initiated Contact with Patient 04/23/14 1801     Chief Complaint  Patient presents with  . Abscess     (Consider location/radiation/quality/duration/timing/severity/associated sxs/prior Treatment) Patient is a 64 y.o. female presenting with abscess.  Abscess Location:  Head/neck Head/neck abscess location:  R neck Abscess quality: painful   Abscess quality: not draining   Abscess quality comment:  Swelling Duration:  3 days Progression:  Worsening Pain details:    Severity:  Moderate   Timing:  Constant   Progression:  Unchanged Context comment:  Recent right carotid endarterectomy Relieved by:  Nothing Worsened by:  Nothing tried Associated symptoms: no fever, no nausea and no vomiting     Past Medical History  Diagnosis Date  . Hypertension   . Coronary artery disease   . Angina   . Depression   . Claudication   . PAD (peripheral artery disease)   . High cholesterol   . Myocardial infarction 1990's    "1"  . Type II diabetes mellitus   . GERD (gastroesophageal reflux disease)   . Stroke     "mini stroke 1st then regular stroke", denies residual on 07/20/2013  . Arthritis     "knees" (07/20/2013)  . Anxiety   . Carotid stenosis   . Carotid stenosis   . Wears glasses    Past Surgical History  Procedure Laterality Date  . Abdominal hysterectomy    . Tubal ligation    . Esophagogastroduodenoscopy N/A 10/13/2012    Procedure: ESOPHAGOGASTRODUODENOSCOPY (EGD);  Surgeon: Gatha Mayer, MD;  Location: [facility] ENDOSCOPY;  Service: Endoscopy;  Laterality: N/A;  . Lower extremity angiogram  07/20/2013    Unsuccessful attempt at crossing the CTO/notes 07/20/2013  . Coronary angioplasty with stent placement      "1"  . Balloon angioplasty, artery  10/05/2013    DR [name]  . Atherectomy  10/05/2013  . Lower extremity angiogram N/A 07/06/2013    Procedure: LOWER EXTREMITY ANGIOGRAM;  Surgeon: Laverda Page, MD;  Location: [facility] CATH LAB;  Service: Cardiovascular;  Laterality: N/A;  . Lower extremity angiogram N/A 07/20/2013    Procedure: LOWER EXTREMITY ANGIOGRAM;  Surgeon: Laverda Page, MD;  Location: [facility] CATH LAB;  Service: Cardiovascular;  Laterality: N/A;  . Lower extremity angiogram N/A 10/05/2013    Procedure: LOWER EXTREMITY ANGIOGRAM;  Surgeon: Laverda Page, MD;  Location: [facility] CATH LAB;  Service: Cardiovascular;  Laterality: N/A;  . Endarterectomy Left 02/07/2014    Procedure: ENDARTERECTOMY CAROTID;  Surgeon: Elam Dutch, MD;  Location: [place];  Service: Vascular;  Laterality: Left;  . Patch angioplasty Left 02/07/2014    Procedure: PATCH ANGIOPLASTY Carotid;  Surgeon: Elam Dutch, MD;  Location: [facility] OR;  Service: Vascular;  Laterality: Left;  . Carotid endarterectomy    . Endarterectomy Right 04/18/2014    Procedure: ENDARTERECTOMY RIGHT CAROTID;  Surgeon: Elam Dutch, MD;  Location: [facility] OR;  Service: Vascular;  Laterality: Right;  . Patch angioplasty Right 04/18/2014    Procedure: PATCH ANGIOPLASTY USING HEMASHIELD 0.8cmx 7.6cm PATCH;  Surgeon: Elam Dutch, MD;  Location: [place];  Service: Vascular;  Laterality: Right;   No family history on file. History  Substance Use Topics  . Smoking status: Former Smoker -- 0.50 packs/day for 4 years    Types: Cigarettes    Quit date: 05/30/2009  . Smokeless tobacco: Never Used  . Alcohol Use:  No   OB History    No data available     Review of Systems  Constitutional: Negative for fever.  Gastrointestinal: Negative for nausea and vomiting.  All other systems reviewed and are negative.     Allergies  Review of patient's allergies indicates no known allergies.  Home Medications   Prior to Admission medications   Medication Sig Start Date End Date Taking? Authorizing Provider  ALPRAZolam [name]) 0.25 MG tablet Take 0.25 mg by mouth daily as needed for anxiety.    Historical Provider, MD  amLODipine (NORVASC)  10 MG tablet Take 10 mg by mouth daily.    Historical Provider, MD  aspirin EC 81 MG tablet Take 81 mg by mouth daily.    Historical Provider, MD  cilostazol (PLETAL) 100 MG tablet Take 100 mg by mouth 2 (two) times daily.  05/25/13   Historical Provider, MD  clopidogrel (PLAVIX) 75 MG tablet Take 1 tablet (75 mg total) by mouth daily. 10/06/13   [name], MD  HYDROcodone-acetaminophen (NORCO/VICODIN) 5-325 MG per tablet Take 1 tablet by mouth every 6 (six) hours as needed for moderate pain. 04/19/14   Alvia Grove, PA-C  lisinopril-hydrochlorothiazide (PRINZIDE,ZESTORETIC) 20-12.5 MG per tablet Take 1 tablet by mouth 2 (two) times daily.     Historical Provider, MD  meloxicam (MOBIC) 7.5 MG tablet Take 7.5 mg by mouth daily as needed for pain.  05/14/13   Historical Provider, MD  metFORMIN (GLUCOPHAGE) 500 MG tablet Take 1 tablet (500 mg total) by mouth daily with breakfast. 07/08/13   [name], MD  metoprolol (LOPRESSOR) 50 MG tablet Take 50 mg by mouth 2 (two) times daily.    Historical Provider, MD  niacin (NIASPAN) 1000 MG CR tablet Take 1 tablet by mouth daily. 12/21/13   Historical Provider, MD  nitroGLYCERIN (NITROSTAT) 0.4 MG SL tablet Place 0.4 mg under the tongue every 5 (five) minutes as needed for chest pain.     Historical Provider, MD  omeprazole (PRILOSEC) 20 MG capsule Take 20 mg by mouth daily.    Historical Provider, MD  pravastatin (PRAVACHOL) 40 MG tablet Take 40 mg by mouth daily.    Historical Provider, MD  tiZANidine (ZANAFLEX) 4 MG tablet Take 4 mg by mouth at bedtime as needed for muscle spasms.  03/11/14   Historical Provider, MD  traMADol (ULTRAM) 50 MG tablet Take 1 tablet (50 mg total) by mouth every 6 (six) hours as needed for moderate pain. 02/08/14   Alvia Grove, PA-C  zolpidem (AMBIEN) 5 MG tablet Take 5 mg by mouth at bedtime as needed for sleep.    Historical Provider, MD   BP 196/74 mmHg  Pulse 127  Temp(Src) 98.4 F (36.9 C) (Oral)  Resp 18  Ht 4\' 11"   (1.499 m)  Wt 127 lb (57.607 kg)  BMI 25.64 kg/m2  SpO2 98% Physical Exam  Constitutional: She is oriented to person, place, and time. She appears well-developed and well-nourished. No distress.  HENT:  Head: Normocephalic and atraumatic.  Mouth/Throat: Oropharynx is clear and moist.  Eyes: Conjunctivae are normal. Pupils are equal, round, and reactive to light. No scleral icterus.  Neck: Neck supple.  Moderate sized area of swelling underlying well healing surgical incision.  No drainage, mildly painful. No erythema.   Cardiovascular: Regular rhythm, normal heart sounds and intact distal pulses.  Tachycardia present.   No murmur heard. Pulmonary/Chest: Effort normal and breath sounds normal. No stridor. No respiratory distress. She has no rales.  Abdominal: Soft. Bowel sounds are normal. She exhibits no distension. There is no tenderness.  Musculoskeletal: Normal range of motion.  Neurological: She is alert and oriented to person, place, and time.  Skin: Skin is warm and dry. No rash noted.  Psychiatric: She has a normal mood and affect. Her behavior is normal.  Nursing note and vitals reviewed.   ED Course  Procedures (including critical care time) Labs Review Labs Reviewed - No data to display Outside hospital labs reviewed.  Outside hospital CT report reviewed, CT images viewed independently.  Imaging Review No results found.   EKG Interpretation   Date/Time:  Saturday April 23 2014 18:14:58 EST Ventricular Rate:  127 PR Interval:  152 QRS Duration: 80 QT Interval:  293 QTC Calculation: 426 R Axis:   47 Text Interpretation:  Sinus tachycardia Probable left atrial enlargement  Borderline T abnormalities, inferior leads compared to prior, rate  increased Confirmed by [facility]  MD, TREY ([id number]) on 04/23/2014 7:37:47 PM      MDM   Final diagnoses:  Hematoma of neck, initial encounter    Pt sent from [facility] secondary to right neck fluid collection felt to  be abscess by CT.  This appears to be associated with her recent right carotid endarterectomy.  However, doesn't have leukocytosis or erythema and neck is only mildly painful.  Vascular surgery has seen her and will take her to the OR.  They feel that is may be a hematoma.    Serita Grit, MD 04/23/14 2010

## 2014-04-23 NOTE — ED Notes (Signed)
EDP at bedside  

## 2014-04-23 NOTE — ED Notes (Signed)
CBG 145  

## 2014-04-23 NOTE — H&P (Addendum)
[facility] [place] HISTORY AND PHYSICAL   History of Present Illness:  Patient is a 64 y.o. year old female who presents for evaluation of right neck swelling.  Pt underwent right CEA earlier this week.  She has had progressive swelling right neck slowly over the last several days.  Pt really with no complaints but came to the ER at the suggestion of her daughter.  She is on Plavix Aspirin Pletal and Mobic.  She denies any fever chills or drainage.  She denies difficulty swallowing or trouble breathing.  She denies any symptoms of TIA amaurosis or stroke.  Other medical problems include CAD, PAD, hypertension, elevated cholesterol, diabetes all of which are stable.  Past Medical History  Diagnosis Date  . Hypertension   . Coronary artery disease   . Angina   . Depression   . Claudication   . PAD (peripheral artery disease)   . High cholesterol   . Myocardial infarction 1990's    "1"  . Type II diabetes mellitus   . GERD (gastroesophageal reflux disease)   . Stroke     "mini stroke 1st then regular stroke", denies residual on 07/20/2013  . Arthritis     "knees" (07/20/2013)  . Anxiety   . Carotid stenosis   . Carotid stenosis   . Wears glasses     Past Surgical History  Procedure Laterality Date  . Abdominal hysterectomy    . Tubal ligation    . Esophagogastroduodenoscopy N/A 10/13/2012    Procedure: ESOPHAGOGASTRODUODENOSCOPY (EGD);  Surgeon: Gatha Mayer, MD;  Location: [facility] ENDOSCOPY;  Service: Endoscopy;  Laterality: N/A;  . Lower extremity angiogram  07/20/2013    Unsuccessful attempt at crossing the CTO/notes 07/20/2013  . Coronary angioplasty with stent placement      "1"  . Balloon angioplasty, artery  10/05/2013    DR [name]  . Atherectomy  10/05/2013  . Lower extremity angiogram N/A 07/06/2013    Procedure: LOWER EXTREMITY ANGIOGRAM;  Surgeon: Laverda Page, MD;  Location: [facility] CATH LAB;  Service: Cardiovascular;  Laterality: N/A;  . Lower extremity  angiogram N/A 07/20/2013    Procedure: LOWER EXTREMITY ANGIOGRAM;  Surgeon: Laverda Page, MD;  Location: [facility] CATH LAB;  Service: Cardiovascular;  Laterality: N/A;  . Lower extremity angiogram N/A 10/05/2013    Procedure: LOWER EXTREMITY ANGIOGRAM;  Surgeon: Laverda Page, MD;  Location: [facility] CATH LAB;  Service: Cardiovascular;  Laterality: N/A;  . Endarterectomy Left 02/07/2014    Procedure: ENDARTERECTOMY CAROTID;  Surgeon: Elam Dutch, MD;  Location: [place];  Service: Vascular;  Laterality: Left;  . Patch angioplasty Left 02/07/2014    Procedure: PATCH ANGIOPLASTY Carotid;  Surgeon: Elam Dutch, MD;  Location: [facility] OR;  Service: Vascular;  Laterality: Left;  . Carotid endarterectomy    . Endarterectomy Right 04/18/2014    Procedure: ENDARTERECTOMY RIGHT CAROTID;  Surgeon: Elam Dutch, MD;  Location: [facility] OR;  Service: Vascular;  Laterality: Right;  . Patch angioplasty Right 04/18/2014    Procedure: PATCH ANGIOPLASTY USING HEMASHIELD 0.8cmx 7.6cm PATCH;  Surgeon: Elam Dutch, MD;  Location: [facility] OR;  Service: Vascular;  Laterality: Right;    Social History History  Substance Use Topics  . Smoking status: Former Smoker -- 0.50 packs/day for 4 years    Types: Cigarettes    Quit date: 05/30/2009  . Smokeless tobacco: Never Used  . Alcohol Use: No    Family History No family history on file.  Allergies  No Known Allergies   Current Facility-Administered Medications  Medication Dose Route Frequency Provider Last Rate Last Dose  . ALPRAZolam (XANAX) tablet 0.25 mg  0.25 mg Oral Daily PRN Elam Dutch, MD      . amLODipine (NORVASC) tablet 10 mg  10 mg Oral Daily Elam Dutch, MD      . aspirin EC tablet 81 mg  81 mg Oral Daily Elam Dutch, MD      . Derrill Memo ON 04/24/2014] insulin aspart (novoLOG) injection 0-15 Units  0-15 Units Subcutaneous TID WC Elam Dutch, MD      . lisinopril-hydrochlorothiazide (PRINZIDE,ZESTORETIC) 20-12.5 MG per tablet 1 tablet   1 tablet Oral BID Elam Dutch, MD      . Derrill Memo ON 04/24/2014] metFORMIN (GLUCOPHAGE) tablet 500 mg  500 mg Oral Q breakfast Elam Dutch, MD      . metoprolol tartrate (LOPRESSOR) tablet 50 mg  50 mg Oral BID Elam Dutch, MD      . nitroGLYCERIN (NITROSTAT) SL tablet 0.4 mg  0.4 mg Sublingual Q5 min PRN Elam Dutch, MD      . pravastatin (PRAVACHOL) tablet 40 mg  40 mg Oral Daily Elam Dutch, MD      . tiZANidine (ZANAFLEX) tablet 4 mg  4 mg Oral QHS PRN Elam Dutch, MD      . zolpidem (AMBIEN) tablet 5 mg  5 mg Oral QHS PRN Elam Dutch, MD       Current Outpatient Prescriptions  Medication Sig Dispense Refill  . ALPRAZolam (XANAX) 0.25 MG tablet Take 0.25 mg by mouth daily as needed for anxiety.    Marland Kitchen amLODipine (NORVASC) 10 MG tablet Take 10 mg by mouth daily.    Marland Kitchen aspirin EC 81 MG tablet Take 81 mg by mouth daily.    . cilostazol (PLETAL) 100 MG tablet Take 100 mg by mouth 2 (two) times daily.     . clopidogrel (PLAVIX) 75 MG tablet Take 1 tablet (75 mg total) by mouth daily. 30 tablet 3  . HYDROcodone-acetaminophen (NORCO/VICODIN) 5-325 MG per tablet Take 1 tablet by mouth every 6 (six) hours as needed for moderate pain. 30 tablet 0  . lisinopril-hydrochlorothiazide (PRINZIDE,ZESTORETIC) 20-12.5 MG per tablet Take 1 tablet by mouth 2 (two) times daily.     . meloxicam (MOBIC) 7.5 MG tablet Take 7.5 mg by mouth daily as needed for pain.     . metFORMIN (GLUCOPHAGE) 500 MG tablet Take 1 tablet (500 mg total) by mouth daily with breakfast.    . metoprolol (LOPRESSOR) 50 MG tablet Take 50 mg by mouth 2 (two) times daily.    . niacin (NIASPAN) 1000 MG CR tablet Take 1 tablet by mouth daily.  6  . nitroGLYCERIN (NITROSTAT) 0.4 MG SL tablet Place 0.4 mg under the tongue every 5 (five) minutes as needed for chest pain.     Marland Kitchen omeprazole (PRILOSEC) 20 MG capsule Take 20 mg by mouth daily.    . pravastatin (PRAVACHOL) 40 MG tablet Take 40 mg by mouth daily.    Marland Kitchen  tiZANidine (ZANAFLEX) 4 MG tablet Take 4 mg by mouth at bedtime as needed for muscle spasms.   2  . traMADol (ULTRAM) 50 MG tablet Take 1 tablet (50 mg total) by mouth every 6 (six) hours as needed for moderate pain. 15 tablet 0  . zolpidem (AMBIEN) 5 MG tablet Take 5 mg by mouth at bedtime as needed for sleep.  ROS:   General:  No weight loss, Fever, chills  HEENT: No recent headaches, no nasal bleeding, no visual changes, no sore throat  Neurologic: No dizziness, blackouts, seizures. No recent symptoms of stroke or mini- stroke. No recent episodes of slurred speech, or temporary blindness.  Cardiac: No recent episodes of chest pain/pressure, no shortness of breath at rest.  + shortness of breath with exertion.  Denies history of atrial fibrillation or irregular heartbeat  Vascular: No history of rest pain in feet.  + history of claudication.  No history of non-healing ulcer, No history of DVT   Pulmonary: No home oxygen, no productive cough, no hemoptysis,  No asthma or wheezing  Musculoskeletal:  [x ] Arthritis, [ ]  Low back pain,  [ ]  Joint pain  Hematologic:No history of hypercoagulable state.  No history of easy bleeding.  No history of anemia  Gastrointestinal: No hematochezia or melena,  No gastroesophageal reflux, no trouble swallowing  Urinary: [ ]  chronic Kidney disease, [ ]  on HD - [ ]  MWF or [ ]  TTHS, [ ]  Burning with urination, [ ]  Frequent urination, [ ]  Difficulty urinating;   Skin: No rashes  Psychological: + history of anxiety,  No history of depression   Physical Examination  Filed Vitals:   04/23/14 1805 04/23/14 1809 04/23/14 1900 04/23/14 1915  BP:  196/74    Pulse:  127 111 105  Temp:  98.4 F (36.9 C)    TempSrc:  Oral    Resp:  18 15 16   Height:  4\' 11"  (1.499 m)    Weight:  127 lb (57.607 kg)    SpO2: 97% 98% 98% 97%    Body mass index is 25.64 kg/(m^2).  General:  Alert and oriented, no acute distress HEENT: Normal Neck: No bruit or  JVD, 5 cm area of swelling right neck, no drainage no erythema, not really painful on palpation Pulmonary: Clear to auscultation bilaterally Cardiac: Regular Rate and Rhythm  Abdomen: Soft, non-tender, non-distended, no mass Skin: No rash Extremity Pulses:  2+ radial, brachial, femoral, absent dorsalis pedis, posterior tibial pulses bilaterally Musculoskeletal: No deformity or edema  Neurologic: Upper and lower extremity motor 5/5 and symmetric  ASSESSMENT:  Hematoma vs seroma right neck with no airway compromise, less likely infection given presenting symptoms   PLAN:  Admit to 2W.  To OR in am for evacuation of hematoma.  Plan discussed with pt and her daughter.  Risks benefits procedure discussed.  Hold plavix and pletal for now  Ruta Hinds, MD [facility] [place] Office: [phone] Pager: [phone]

## 2014-04-23 NOTE — ED Notes (Signed)
EKG completed given to EDP.  

## 2014-04-23 NOTE — ED Notes (Signed)
Patient had right carotid surgery 5 days ago and developed swelling right side of neck 2-3 days ago. Seen at another hospital for evaluation of swelling of right neck. Airway intact bilateral equal chest rise and fall.

## 2014-04-24 ENCOUNTER — Encounter (HOSPITAL_COMMUNITY)
Admission: EM | Disposition: A | Discharge status: Home / Self Care | Payer: Self-pay | Source: Home / Self Care | Attending: Vascular Surgery

## 2014-04-24 ENCOUNTER — Encounter (HOSPITAL_COMMUNITY): Payer: Self-pay | Admitting: Anesthesiology

## 2014-04-24 ENCOUNTER — Inpatient Hospital Stay (HOSPITAL_COMMUNITY): Payer: Medicare Other | Admitting: Anesthesiology

## 2014-04-24 DIAGNOSIS — I9762 Postprocedural hemorrhage and hematoma of a circulatory system organ or structure following other procedure: Secondary | ICD-10-CM

## 2014-04-24 HISTORY — PX: ENDARTERECTOMY: SHX5162

## 2014-04-24 LAB — BASIC METABOLIC PANEL
Anion gap: 10 (ref 5–15)
BUN: 22 mg/dL (ref 6–23)
CO2: 29 mmol/L (ref 19–32)
Calcium: 9.9 mg/dL (ref 8.4–10.5)
Chloride: 101 mmol/L (ref 96–112)
Creatinine, Ser: 1.48 mg/dL — ABNORMAL HIGH (ref 0.50–1.10)
GFR calc Af Amer: 42 mL/min — ABNORMAL LOW (ref >90)
GFR calc non Af Amer: 37 mL/min — ABNORMAL LOW (ref >90)
GLUCOSE: 123 mg/dL — AB (ref 70–99)
Potassium: 4.3 mmol/L (ref 3.5–5.1)
Sodium: 140 mmol/L (ref 135–145)

## 2014-04-24 LAB — GLUCOSE, CAPILLARY
GLUCOSE-CAPILLARY: 105 mg/dL — AB (ref 70–99)
GLUCOSE-CAPILLARY: 142 mg/dL — AB (ref 70–99)
Glucose-Capillary: 162 mg/dL — ABNORMAL HIGH (ref 70–99)
Glucose-Capillary: 176 mg/dL — ABNORMAL HIGH (ref 70–99)

## 2014-04-24 LAB — CBC
HEMATOCRIT: 28.8 % — AB (ref 36.0–46.0)
HEMOGLOBIN: 9.1 g/dL — AB (ref 12.0–15.0)
MCH: 24 pg — ABNORMAL LOW (ref 26.0–34.0)
MCHC: 31.6 g/dL (ref 30.0–36.0)
MCV: 76 fL — AB (ref 78.0–100.0)
Platelets: 304 10*3/uL (ref 150–400)
RBC: 3.79 MIL/uL — ABNORMAL LOW (ref 3.87–5.11)
RDW: 15.1 % (ref 11.5–15.5)
WBC: 7 10*3/uL (ref 4.0–10.5)

## 2014-04-24 LAB — SURGICAL PCR SCREEN
MRSA, PCR: NEGATIVE
MRSA, PCR: NEGATIVE
STAPHYLOCOCCUS AUREUS: NEGATIVE
STAPHYLOCOCCUS AUREUS: NEGATIVE

## 2014-04-24 SURGERY — IRRIGATION AND DEBRIDEMENT EXTREMITY
Anesthesia: General | Site: Neck | Laterality: Right

## 2014-04-24 SURGERY — ENDARTERECTOMY, CAROTID
Anesthesia: General

## 2014-04-24 MED ORDER — SUCCINYLCHOLINE CHLORIDE 20 MG/ML IJ SOLN
INTRAMUSCULAR | Status: AC
  Filled 2014-04-24: qty 1

## 2014-04-24 MED ORDER — ONDANSETRON HCL 4 MG/2ML IJ SOLN: 4.0000 mg | Freq: Once | INTRAMUSCULAR | Status: AC | PRN

## 2014-04-24 MED ORDER — PHENYLEPHRINE 40 MCG/ML (10ML) SYRINGE FOR IV PUSH (FOR BLOOD PRESSURE SUPPORT)
PREFILLED_SYRINGE | INTRAVENOUS | Status: AC
  Filled 2014-04-24: qty 10

## 2014-04-24 MED ORDER — HYDROMORPHONE HCL 1 MG/ML IJ SOLN
0.2500 mg | INTRAMUSCULAR | Status: DC | PRN
  Administered 2014-04-24: 0.5 mg via INTRAVENOUS

## 2014-04-24 MED ORDER — SODIUM CHLORIDE 0.9 % IJ SOLN
INTRAMUSCULAR | Status: AC
  Filled 2014-04-24: qty 10

## 2014-04-24 MED ORDER — MIDAZOLAM HCL 5 MG/5ML IJ SOLN
INTRAMUSCULAR | Status: DC | PRN
  Administered 2014-04-24: 2 mg via INTRAVENOUS

## 2014-04-24 MED ORDER — HYDROMORPHONE HCL 1 MG/ML IJ SOLN
INTRAMUSCULAR | Status: AC
  Filled 2014-04-24: qty 1

## 2014-04-24 MED ORDER — SUFENTANIL CITRATE 50 MCG/ML IV SOLN
INTRAVENOUS | Status: DC | PRN
  Administered 2014-04-24: 10 ug via INTRAVENOUS

## 2014-04-24 MED ORDER — CEFAZOLIN SODIUM-DEXTROSE 2-3 GM-% IV SOLR
INTRAVENOUS | Status: DC | PRN
  Administered 2014-04-24: 2 g via INTRAVENOUS

## 2014-04-24 MED ORDER — GLYCOPYRROLATE 0.2 MG/ML IJ SOLN
INTRAMUSCULAR | Status: DC | PRN
  Administered 2014-04-24: 0.2 mg via INTRAVENOUS

## 2014-04-24 MED ORDER — 0.9 % SODIUM CHLORIDE (POUR BTL) OPTIME
TOPICAL | Status: DC | PRN
  Administered 2014-04-24: 3000 mL

## 2014-04-24 MED ORDER — POTASSIUM CHLORIDE CRYS ER 20 MEQ PO TBCR: 20.0000 meq | EXTENDED_RELEASE_TABLET | Freq: Every day | ORAL | Status: DC | PRN

## 2014-04-24 MED ORDER — SODIUM CHLORIDE 0.9 % IJ SOLN
3.0000 mL | INTRAMUSCULAR | Status: DC | PRN
  Administered 2014-04-24: 3 mL via INTRAVENOUS
  Filled 2014-04-24: qty 3

## 2014-04-24 MED ORDER — PHENYLEPHRINE HCL 10 MG/ML IJ SOLN
INTRAMUSCULAR | Status: DC | PRN
  Administered 2014-04-24 (×4): 80 ug via INTRAVENOUS

## 2014-04-24 MED ORDER — SODIUM CHLORIDE 0.9 % IV SOLN: 250.0000 mL | INTRAVENOUS | Status: DC | PRN

## 2014-04-24 MED ORDER — LIDOCAINE HCL (CARDIAC) 20 MG/ML IV SOLN
INTRAVENOUS | Status: AC
  Filled 2014-04-24: qty 5

## 2014-04-24 MED ORDER — MAGNESIUM SULFATE 2 GM/50ML IV SOLN
2.0000 g | Freq: Every day | INTRAVENOUS | Status: DC | PRN
  Filled 2014-04-24: qty 50

## 2014-04-24 MED ORDER — DEXAMETHASONE SODIUM PHOSPHATE 4 MG/ML IJ SOLN
INTRAMUSCULAR | Status: DC | PRN
  Administered 2014-04-24: 4 mg via INTRAVENOUS

## 2014-04-24 MED ORDER — SODIUM CHLORIDE 0.9 % IJ SOLN
3.0000 mL | Freq: Two times a day (BID) | INTRAMUSCULAR | Status: DC
  Administered 2014-04-25 – 2014-04-27 (×4): 3 mL via INTRAVENOUS

## 2014-04-24 MED ORDER — PIPERACILLIN-TAZOBACTAM 3.375 G IVPB
3.3750 g | Freq: Four times a day (QID) | INTRAVENOUS | Status: AC
  Administered 2014-04-24: 3.375 g via INTRAVENOUS
  Filled 2014-04-24 (×3): qty 50

## 2014-04-24 MED ORDER — SUFENTANIL CITRATE 50 MCG/ML IV SOLN
INTRAVENOUS | Status: AC
  Filled 2014-04-24: qty 1

## 2014-04-24 MED ORDER — PROPOFOL 10 MG/ML IV BOLUS
INTRAVENOUS | Status: DC | PRN
  Administered 2014-04-24: 40 mg via INTRAVENOUS
  Administered 2014-04-24: 110 mg via INTRAVENOUS

## 2014-04-24 MED ORDER — DEXAMETHASONE SODIUM PHOSPHATE 4 MG/ML IJ SOLN
INTRAMUSCULAR | Status: AC
  Filled 2014-04-24: qty 1

## 2014-04-24 MED ORDER — PROPOFOL 10 MG/ML IV BOLUS
INTRAVENOUS | Status: AC
  Filled 2014-04-24: qty 20

## 2014-04-24 MED ORDER — ONDANSETRON HCL 4 MG/2ML IJ SOLN
INTRAMUSCULAR | Status: AC
  Filled 2014-04-24: qty 2

## 2014-04-24 MED ORDER — SUCCINYLCHOLINE CHLORIDE 20 MG/ML IJ SOLN
INTRAMUSCULAR | Status: DC | PRN
  Administered 2014-04-24: 80 mg via INTRAVENOUS

## 2014-04-24 MED ORDER — ONDANSETRON HCL 4 MG/2ML IJ SOLN
INTRAMUSCULAR | Status: DC | PRN
Start: 2014-04-24 — End: 2014-04-24
  Administered 2014-04-24: 4 mg via INTRAVENOUS

## 2014-04-24 MED ORDER — THROMBIN 20000 UNITS EX SOLR
CUTANEOUS | Status: AC
  Filled 2014-04-24: qty 20000

## 2014-04-24 MED ORDER — LACTATED RINGERS IV SOLN
INTRAVENOUS | Status: DC | PRN
  Administered 2014-04-24: 09:00:00 via INTRAVENOUS

## 2014-04-24 MED ORDER — MIDAZOLAM HCL 2 MG/2ML IJ SOLN
INTRAMUSCULAR | Status: AC
  Filled 2014-04-24: qty 2

## 2014-04-24 MED ORDER — LIDOCAINE HCL (CARDIAC) 20 MG/ML IV SOLN
INTRAVENOUS | Status: DC | PRN
  Administered 2014-04-24: 80 mg via INTRAVENOUS

## 2014-04-24 MED ORDER — VANCOMYCIN HCL IN DEXTROSE 750-5 MG/150ML-% IV SOLN
750.0000 mg | INTRAVENOUS | Status: DC
  Administered 2014-04-24 – 2014-04-26 (×3): 750 mg via INTRAVENOUS
  Filled 2014-04-24 (×4): qty 150

## 2014-04-24 MED ORDER — GLYCOPYRROLATE 0.2 MG/ML IJ SOLN
INTRAMUSCULAR | Status: AC
Start: 2014-04-24 — End: 2014-04-24
  Filled 2014-04-24: qty 1

## 2014-04-24 SURGICAL SUPPLY — 52 items
ADH SKN CLS APL DERMABOND .7 (GAUZE/BANDAGES/DRESSINGS) ×1
CANISTER SUCTION 2500CC (MISCELLANEOUS) ×2 IMPLANT
CANNULA VESSEL 3MM 2 BLNT TIP (CANNULA) ×1 IMPLANT
CATH ROBINSON RED A/P 18FR (CATHETERS) ×1 IMPLANT
CLIP TI MEDIUM 6 (CLIP) ×2 IMPLANT
CLIP TI WIDE RED SMALL 6 (CLIP) ×2 IMPLANT
CRADLE DONUT ADULT HEAD (MISCELLANEOUS) ×2 IMPLANT
DECANTER SPIKE VIAL GLASS SM (MISCELLANEOUS) IMPLANT
DERMABOND ADVANCED (GAUZE/BANDAGES/DRESSINGS) ×1
DERMABOND ADVANCED .7 DNX12 (GAUZE/BANDAGES/DRESSINGS) IMPLANT
DRAIN HEMOVAC 1/8 X 5 (WOUND CARE) ×1 IMPLANT
ELECT REM PT RETURN 9FT ADLT (ELECTROSURGICAL) ×2
ELECTRODE REM PT RTRN 9FT ADLT (ELECTROSURGICAL) ×1 IMPLANT
EVACUATOR SILICONE 100CC (DRAIN) ×1 IMPLANT
GAUZE SPONGE 2X2 8PLY STRL LF (GAUZE/BANDAGES/DRESSINGS) IMPLANT
GAUZE SPONGE 4X4 12PLY STRL (GAUZE/BANDAGES/DRESSINGS) ×2 IMPLANT
GEL ULTRASOUND 20GR AQUASONIC (MISCELLANEOUS) IMPLANT
GLOVE BIO SURGEON STRL SZ7.5 (GLOVE) ×2 IMPLANT
GLOVE BIOGEL PI IND STRL 6.5 (GLOVE) IMPLANT
GLOVE BIOGEL PI IND STRL 7.5 (GLOVE) IMPLANT
GLOVE BIOGEL PI INDICATOR 6.5 (GLOVE) ×1
GLOVE BIOGEL PI INDICATOR 7.5 (GLOVE) ×1
GLOVE SS BIOGEL STRL SZ 7.5 (GLOVE) IMPLANT
GLOVE SUPERSENSE BIOGEL SZ 7.5 (GLOVE) ×1
GOWN STRL REUS W/ TWL LRG LVL3 (GOWN DISPOSABLE) ×3 IMPLANT
GOWN STRL REUS W/TWL LRG LVL3 (GOWN DISPOSABLE) ×4
KIT BASIN OR (CUSTOM PROCEDURE TRAY) ×2 IMPLANT
KIT ROOM TURNOVER OR (KITS) ×2 IMPLANT
LOOP VESSEL MINI RED (MISCELLANEOUS) IMPLANT
NDL HYPO 25GX1X1/2 BEV (NEEDLE) IMPLANT
NEEDLE HYPO 25GX1X1/2 BEV (NEEDLE) IMPLANT
NS IRRIG 1000ML POUR BTL (IV SOLUTION) ×5 IMPLANT
PACK CAROTID (CUSTOM PROCEDURE TRAY) ×2 IMPLANT
PAD ARMBOARD 7.5X6 YLW CONV (MISCELLANEOUS) ×4 IMPLANT
SHUNT CAROTID BYPASS 10 (VASCULAR PRODUCTS) IMPLANT
SHUNT CAROTID BYPASS 12FRX15.5 (VASCULAR PRODUCTS) IMPLANT
SPONGE GAUZE 2X2 STER 10/PKG (GAUZE/BANDAGES/DRESSINGS) ×1
SPONGE GAUZE 4X4 12PLY STER LF (GAUZE/BANDAGES/DRESSINGS) ×1 IMPLANT
SPONGE INTESTINAL PEANUT (DISPOSABLE) ×2 IMPLANT
SPONGE SURGIFOAM ABS GEL 100 (HEMOSTASIS) IMPLANT
SUT ETHILON 3 0 PS 1 (SUTURE) ×1 IMPLANT
SUT PROLENE 6 0 CC (SUTURE) ×2 IMPLANT
SUT PROLENE 7 0 BV 1 (SUTURE) IMPLANT
SUT SILK 3 0 TIES 17X18 (SUTURE)
SUT SILK 3-0 18XBRD TIE BLK (SUTURE) IMPLANT
SUT VIC AB 3-0 SH 27 (SUTURE) ×2
SUT VIC AB 3-0 SH 27X BRD (SUTURE) ×1 IMPLANT
SUT VICRYL 4-0 PS2 18IN ABS (SUTURE) ×2 IMPLANT
SYR BULB IRRIGATION 50ML (SYRINGE) ×1 IMPLANT
SYR CONTROL 10ML LL (SYRINGE) IMPLANT
TAPE CLOTH SURG 4X10 WHT LF (GAUZE/BANDAGES/DRESSINGS) ×1 IMPLANT
WATER STERILE IRR 1000ML POUR (IV SOLUTION) ×2 IMPLANT

## 2014-04-24 NOTE — Anesthesia Procedure Notes (Signed)
Procedure Name: Intubation Date/Time: 04/24/2014 9:34 AM Performed by: Claris Che Pre-anesthesia Checklist: Patient identified, Emergency Drugs available, Suction available, Patient being monitored and Timeout performed Patient Re-evaluated:Patient Re-evaluated prior to inductionOxygen Delivery Method: Circle system utilized Preoxygenation: Pre-oxygenation with 100% oxygen Intubation Type: IV induction and Cricoid Pressure applied Ventilation: Mask ventilation without difficulty Laryngoscope Size: Mac and 4 Grade View: Grade I Tube type: Oral Tube size: 8.0 mm Number of attempts: 1 Airway Equipment and Method: Stylet and LTA kit utilized Placement Confirmation: ETT inserted through vocal cords under direct vision,  positive ETCO2 and breath sounds checked- equal and bilateral Secured at: 22 cm Tube secured with: Tape Dental Injury: Teeth and Oropharynx as per pre-operative assessment

## 2014-04-24 NOTE — Op Note (Signed)
Procedure: Incise and drain right neck the  Preoperative diagnosis: Hematoma right neck post carotid endarterectomy   Postoperative diagnosis: Seroma right neck  Anesthesia: Gen.  Indications: Patient is a 64 year old female who underwent right carotid endarterectomy several days ago. She has had slowly progressive fluid enlargement in her right neck over the last few days. She presents for drainage of this fluid collection.  Specimens: Culture right neck fluid  Operative details: After obtaining informed consent, the patient was taken to the operating room. The patient was placed in supine position on the operating table. After induction of general anesthesia and endotracheal intubation the patient's entire right neck and chest were prepped and draped usual sterile fashion. Next a pre-existing right neck incision was reopened sharply. There was some clear fluid in the subcutaneous tissues. Upon opening the deeper layer of sutures there was a fluid collection of approximately 30 cc of clear fluid with some particulate matter within it. This was very thin in character and did not appear to be purulent. There were some filmy areas of fibrinous exudate in these were all debrided away. The patch was not fully exposed. There was no active bleeding. Cultures were taken of the fluid from the right neck. The neck was then thoroughly irrigated with 3 L of normal saline solution. A separate stab incision was made at the base of the neck with a 15 blade and a 10 flat Jackson-Pratt drain brought out through this stab incision. The deep layers of the neck were then closed with running 3-0 Vicryl suture. The skin was closed with a 4 0 Vicryl subcuticular stitch. The patient tolerated the procedure well and there were no complications. The instrument sponge and needle counts were correct at the end of the case. The patient was taken to recovery in stable condition.  Ruta Hinds, MD [facility]  [facility] [place] Office: [phone] Pager: [phone]

## 2014-04-24 NOTE — Progress Notes (Signed)
ANTIBIOTIC CONSULT NOTE - INITIAL  Pharmacy Consult for Vancomycin Indication: surgical prophylaxis/coverage until neck culture returns for seroma  No Known Allergies  Patient Measurements: Height: 4\' 11"  (149.9 cm) Weight: 122 lb 5.7 oz (55.5 kg) IBW/kg (Calculated) : 43.2 Adjusted Body Weight: n/a  Vital Signs: Temp: 98.2 F (36.8 C) (03/13 1032) Temp Source: Oral (03/13 0817) BP: 139/58 mmHg (03/13 1145) Pulse Rate: 93 (03/13 1145) Intake/Output from previous day:   Intake/Output from this shift: Total I/O In: 900 [I.V.:900] Out: -   Labs:  Recent Labs  04/24/14 0510  WBC 7.0  HGB 9.1*  PLT 304  CREATININE 1.48*   Estimated Creatinine Clearance: 29.5 mL/min (by C-G formula based on Cr of 1.48). No results for input(s): VANCOTROUGH, VANCOPEAK, VANCORANDOM, GENTTROUGH, GENTPEAK, GENTRANDOM, TOBRATROUGH, TOBRAPEAK, TOBRARND, AMIKACINPEAK, AMIKACINTROU, AMIKACIN in the last 72 hours.   Microbiology: Recent Results (from the past 720 hour(s))  Surgical pcr screen     Status: None   Collection Time: 04/06/14  8:34 AM  Result Value Ref Range Status   MRSA, PCR NEGATIVE NEGATIVE Final   Staphylococcus aureus NEGATIVE NEGATIVE Final    Comment:        The Xpert SA Assay (FDA approved for NASAL specimens in patients over 26 years of age), is one component of a comprehensive surveillance program.  Test performance has been validated by [facility] for patients greater than or equal to 68 year old. It is not intended to diagnose infection nor to guide or monitor treatment.   Surgical pcr screen     Status: None   Collection Time: 04/23/14  9:06 PM  Result Value Ref Range Status   MRSA, PCR NEGATIVE NEGATIVE Final   Staphylococcus aureus NEGATIVE NEGATIVE Final    Comment:        The Xpert SA Assay (FDA approved for NASAL specimens in patients over 40 years of age), is one component of a comprehensive surveillance program.  Test performance has been  validated by [facility] for patients greater than or equal to 79 year old. It is not intended to diagnose infection nor to guide or monitor treatment.   Surgical pcr screen     Status: None   Collection Time: 04/24/14  4:43 AM  Result Value Ref Range Status   MRSA, PCR NEGATIVE NEGATIVE Final   Staphylococcus aureus NEGATIVE NEGATIVE Final    Comment:        The Xpert SA Assay (FDA approved for NASAL specimens in patients over 26 years of age), is one component of a comprehensive surveillance program.  Test performance has been validated by [facility] for patients greater than or equal to 17 year old. It is not intended to diagnose infection nor to guide or monitor treatment.     Medical History: Past Medical History  Diagnosis Date  . Hypertension   . Coronary artery disease   . Angina   . Depression   . Claudication   . PAD (peripheral artery disease)   . High cholesterol   . Myocardial infarction 1990's    "1"  . Type II diabetes mellitus   . GERD (gastroesophageal reflux disease)   . Stroke     "mini stroke 1st then regular stroke", denies residual on 07/20/2013  . Arthritis     "knees" (07/20/2013)  . Anxiety   . Carotid stenosis   . Carotid stenosis   . Wears glasses     Medications:  Scheduled:  . amLODipine  10 mg Oral  Daily  . aspirin EC  81 mg Oral Daily  . cefUROXime (ZINACEF)  IV  1.5 g Intravenous On Call to OR  . docusate sodium  100 mg Oral BID  . lisinopril  20 mg Oral BID   And  . hydrochlorothiazide  12.5 mg Oral BID  . HYDROmorphone      . insulin aspart  0-15 Units Subcutaneous TID WC  . metFORMIN  500 mg Oral Q breakfast  . metoprolol  50 mg Oral BID  . pantoprazole  40 mg Oral Daily  . piperacillin-tazobactam (ZOSYN)  IV  3.375 g Intravenous Q6H  . potassium chloride  20-40 mEq Oral Once  . pravastatin  40 mg Oral Daily  . sodium chloride  3 mL Intravenous Q12H  . sodium chloride  3 mL Intravenous Q12H   Assessment: 64 yo  female admitted with R neck swelling, had R CEA earlier this week.  S/p OR this AM for drainage of fluid collection.  Pharmacy asked to begin empiric vancomycin until fluid cultures are available.  Scr 1.48, est CrCl ~ 30 ml/min.  Goal of Therapy:  Vancomycin trough level 15-20 mcg/ml  Plan:  1. Vancomycin 750 mg IV q 24 hrs. 2. F/u cultures. 3. Vancomycin trough at steady state as needed.  Uvaldo Rising, BCPS  Clinical Pharmacist Pager [phone]  04/24/2014 12:39 PM

## 2014-04-24 NOTE — Anesthesia Preprocedure Evaluation (Addendum)
Anesthesia Evaluation  Patient identified by MRN, date of birth, ID band Patient awake    Reviewed: Allergy & Precautions, NPO status   Airway        Dental   Pulmonary former smoker,          Cardiovascular hypertension, + CAD, + Past MI and + Peripheral Vascular Disease     Neuro/Psych  Neuromuscular disease CVA    GI/Hepatic GERD-  ,  Endo/Other  diabetes, Type 2, Oral Hypoglycemic Agents  Renal/GU      Musculoskeletal  (+) Arthritis -,   Abdominal   Peds  Hematology  (+) anemia ,   Anesthesia Other Findings Infection/ hematoma R neck postop  Reproductive/Obstetrics                            Anesthesia Physical Anesthesia Plan  ASA: III  Anesthesia Plan: General   Post-op Pain Management:    Induction: Intravenous  Airway Management Planned: Oral ETT  Additional Equipment:   Intra-op Plan:   Post-operative Plan: Extubation in OR  Informed Consent: I have reviewed the patients History and Physical, chart, labs and discussed the procedure including the risks, benefits and alternatives for the proposed anesthesia with the patient or authorized representative who has indicated his/her understanding and acceptance.     Plan Discussed with: CRNA, Anesthesiologist and Surgeon  Anesthesia Plan Comments:         Anesthesia Quick Evaluation

## 2014-04-24 NOTE — Anesthesia Postprocedure Evaluation (Signed)
  Anesthesia Post-op Note  Patient: Joanne Morris  Procedure(s) Performed: Procedure(s): IRRIGATION AND DEBRIDEMENT OF RIGHT NECK  (N/A)  Patient Location: PACU  Anesthesia Type:General  Level of Consciousness: awake, oriented, sedated and patient cooperative  Airway and Oxygen Therapy: Patient Spontanous Breathing  Post-op Pain: mild  Post-op Assessment: Post-op Vital signs reviewed, Patient's Cardiovascular Status Stable, Respiratory Function Stable, Patent Airway, No signs of Nausea or vomiting and Pain level controlled  Post-op Vital Signs: stable  Last Vitals:  Filed Vitals:   04/24/14 0817  BP: 119/43  Pulse: 93  Temp: 36.9 C  Resp: 16    Complications: No apparent anesthesia complications

## 2014-04-24 NOTE — Transfer of Care (Signed)
Immediate Anesthesia Transfer of Care Note  Patient: Joanne Morris  Procedure(s) Performed: Procedure(s): IRRIGATION AND DEBRIDEMENT OF RIGHT NECK  (N/A)  Patient Location: PACU  Anesthesia Type:General  Level of Consciousness: alert , oriented, sedated, patient cooperative and responds to stimulation  Airway & Oxygen Therapy: Patient Spontanous Breathing and Patient connected to nasal cannula oxygen  Post-op Assessment: Report given to RN, Post -op Vital signs reviewed and stable and Patient moving all extremities X 4  Post vital signs: Reviewed and stable  Last Vitals:  Filed Vitals:   04/24/14 0817  BP: 119/43  Pulse: 93  Temp: 36.9 C  Resp: 16    Complications: No apparent anesthesia complications

## 2014-04-25 LAB — GLUCOSE, CAPILLARY
GLUCOSE-CAPILLARY: 137 mg/dL — AB (ref 70–99)
Glucose-Capillary: 144 mg/dL — ABNORMAL HIGH (ref 70–99)
Glucose-Capillary: 107 mg/dL — ABNORMAL HIGH (ref 70–99)
Glucose-Capillary: 111 mg/dL — ABNORMAL HIGH (ref 70–99)
Glucose-Capillary: 107 mg/dL — ABNORMAL HIGH (ref 70–99)

## 2014-04-25 LAB — CBC
HEMATOCRIT: 27.3 % — AB (ref 36.0–46.0)
HEMOGLOBIN: 8.9 g/dL — AB (ref 12.0–15.0)
MCH: 24.8 pg — ABNORMAL LOW (ref 26.0–34.0)
MCHC: 32.6 g/dL (ref 30.0–36.0)
MCV: 76 fL — ABNORMAL LOW (ref 78.0–100.0)
Platelets: 304 10*3/uL (ref 150–400)
RBC: 3.59 MIL/uL — ABNORMAL LOW (ref 3.87–5.11)
RDW: 14.9 % (ref 11.5–15.5)
WBC: 9.6 10*3/uL (ref 4.0–10.5)

## 2014-04-25 MED ORDER — CILOSTAZOL 100 MG PO TABS
100.0000 mg | ORAL_TABLET | Freq: Two times a day (BID) | ORAL | Status: DC
  Administered 2014-04-25 – 2014-04-27 (×5): 100 mg via ORAL
  Filled 2014-04-25 (×6): qty 1

## 2014-04-25 MED ORDER — CLOPIDOGREL BISULFATE 75 MG PO TABS
75.0000 mg | ORAL_TABLET | Freq: Every day | ORAL | Status: DC
  Administered 2014-04-25 – 2014-04-27 (×3): 75 mg via ORAL
  Filled 2014-04-25 (×3): qty 1

## 2014-04-25 NOTE — Progress Notes (Signed)
Utilization review completed.  

## 2014-04-25 NOTE — Progress Notes (Addendum)
  [facility] Progress Note  04/25/2014 8:08 AM 1 Day Post-Op  Subjective:  Neck sore.   Filed Vitals:   04/25/14 0549  BP: 110/48  Pulse: 67  Temp: 98.4 F (36.9 C)  Resp: 18    Physical Exam: Incisions:  Right neck incision without swelling. Incision clean and intact. No erythema. Drain with minimal serous output Neuro: exam intact.   CBC    Component Value Date/Time   WBC 9.6 04/25/2014 0525   RBC 3.59* 04/25/2014 0525   RBC 4.03 06/01/2011 1416   HGB 8.9* 04/25/2014 0525   HCT 27.3* 04/25/2014 0525   PLT 304 04/25/2014 0525   MCV 76.0* 04/25/2014 0525   MCH 24.8* 04/25/2014 0525   MCHC 32.6 04/25/2014 0525   RDW 14.9 04/25/2014 0525   LYMPHSABS 4.5* 10/02/2010 1054   MONOABS 0.5 10/02/2010 1054   EOSABS 0.1 10/02/2010 1054   BASOSABS 0.0 10/02/2010 1054    BMET    Component Value Date/Time   NA 140 04/24/2014 0510   K 4.3 04/24/2014 0510   CL 101 04/24/2014 0510   CO2 29 04/24/2014 0510   GLUCOSE 123* 04/24/2014 0510   BUN 22 04/24/2014 0510   CREATININE 1.48* 04/24/2014 0510   CALCIUM 9.9 04/24/2014 0510   GFRNONAA 37* 04/24/2014 0510   GFRAA 42* 04/24/2014 0510    INR    Component Value Date/Time   INR 0.97 04/06/2014 0834     Intake/Output Summary (Last 24 hours) at 04/25/14 0808 Last data filed at 04/25/14 0704  Gross per 24 hour  Intake   1380 ml  Output     38 ml  Net   1342 ml     Assessment:  64 y.o. female is s/p: I & D right neck  s/p carotid endartectomy 1 Day Post-Op  Plan: -Drain with minimal output. Incision does not appear infected. Serous drainage. Likely lympathic.  -Continue drain and abx until culture results come back. -Restart plavix and pletal today. -D/c when cx results are back. Likely next 1-2 days.   Virgina Jock, PA-C [facility] Office: [phone] Pager: [phone] 04/25/2014 8:08 AM   Agree with above Keep drain one more day Follow up cultures Continue  empiric antibiotics Most likely d/c tomorrow if culture final  Ruta Hinds, MD [facility] [place]: [phone] Pager: [phone]

## 2014-04-25 NOTE — Progress Notes (Signed)
Nutrition Brief Note  Patient identified on the Malnutrition Screening Tool (MST) Report.   Wt Readings from Last 15 Encounters:  04/23/14 122 lb 5.7 oz (55.5 kg)  04/18/14 124 lb (56.246 kg)  03/31/14 126 lb 12.8 oz (57.516 kg)  02/24/14 121 lb 9.6 oz (55.157 kg)  02/07/14 122 lb (55.339 kg)  02/02/14 122 lb 5.7 oz (55.5 kg)  01/19/14 124 lb (56.246 kg)  01/18/14 124 lb (56.246 kg)  01/13/14 124 lb (56.246 kg)  12/29/13 123 lb (55.792 kg)  10/06/13 128 lb 4.9 oz (58.2 kg)  07/21/13 132 lb 0.9 oz (59.9 kg)  07/06/13 137 lb (62.143 kg)  06/08/13 132 lb (59.875 kg)  10/11/12 127 lb 11.2 oz (57.924 kg)    No significant weight loss noted.  Body mass index is 24.7 kg/(m^2). Patient meets criteria for normal weight based on current BMI.   Current diet order is heart healthy, CHO modified, patient is consuming approximately 60% of meals at this time. Labs and medications reviewed.   No nutrition interventions warranted at this time. If nutrition issues arise, please consult RD.   Molli Barrows, RD, LDN, [place] Pager [phone] After Hours Pager [phone]

## 2014-04-26 ENCOUNTER — Encounter (HOSPITAL_COMMUNITY): Payer: Self-pay | Admitting: Vascular Surgery

## 2014-04-26 LAB — GLUCOSE, CAPILLARY
GLUCOSE-CAPILLARY: 99 mg/dL (ref 70–99)
GLUCOSE-CAPILLARY: 129 mg/dL — AB (ref 70–99)
GLUCOSE-CAPILLARY: 125 mg/dL — AB (ref 70–99)
Glucose-Capillary: 105 mg/dL — ABNORMAL HIGH (ref 70–99)

## 2014-04-26 NOTE — Progress Notes (Signed)
JP drain removed per MD order and protocol. Pt tolerated procedure well. Pt resting in bed. Will continue to monitor.

## 2014-04-26 NOTE — Progress Notes (Signed)
[facility] [place]  Subjective  - wants to go home   Objective 115/60 78 98.7 F (37.1 C) (Oral) 18 99%  Intake/Output Summary (Last 24 hours) at 04/26/14 0752 Last data filed at 04/25/14 2231  Gross per 24 hour  Intake    123 ml  Output      2 ml  Net    121 ml   Right neck drain serosanguinous Right neck incision no drainage  Assessment/Planning: Overall well.  No recurrent swelling Cultures negative to date Continue antibiotics for now Recheck cultures later today if remain negative will d/c home today on Keflex. D/c drain  Jacqulynn Shappell E 04/26/2014 7:52 AM --  Laboratory Lab Results:  Recent Labs  04/24/14 0510 04/25/14 0525  WBC 7.0 9.6  HGB 9.1* 8.9*  HCT 28.8* 27.3*  PLT 304 304   BMET  Recent Labs  04/24/14 0510  NA 140  K 4.3  CL 101  CO2 29  GLUCOSE 123*  BUN 22  CREATININE 1.48*  CALCIUM 9.9    COAG Lab Results  Component Value Date   INR 0.97 04/06/2014   INR 1.00 02/02/2014   INR 0.95 01/18/2014   No results found for: PTT

## 2014-04-26 NOTE — Progress Notes (Signed)
PA paged and aware. Pt's right side of neck swelling. MD to come see pt. Will continue to monitor.

## 2014-04-26 NOTE — Progress Notes (Signed)
Medicare Important Message given? YES  (If response is "NO", the following Medicare IM given date fields will be blank)  Date Medicare IM given: 04/26/14 Medicare IM given by:  Dahlia Client [employer]

## 2014-04-26 NOTE — Progress Notes (Signed)
Cultures reviewed again still no growth Will d/c home today on [place], MD [facility] [place] Office: [phone] Pager: [phone]

## 2014-04-27 LAB — GLUCOSE, CAPILLARY: Glucose-Capillary: 117 mg/dL — ABNORMAL HIGH (ref 70–99)

## 2014-04-27 MED ORDER — CEPHALEXIN 500 MG PO CAPS: 500.0000 mg | ORAL_CAPSULE | Freq: Two times a day (BID) | ORAL | Status: DC

## 2014-04-27 NOTE — Progress Notes (Signed)
Pt discharged home via husband. Volunteer transported pt via wheelchair. Pt education complete and care plans resolved. IV removed and pt tolerated well. Pt alert and oriented with no signs of respiratory distress. No c/o pain. Pt educated about importance of follow up visit. Leanne Chang, RN

## 2014-04-27 NOTE — Progress Notes (Addendum)
  [facility] Progress Note  04/27/2014 8:45 AM 3 Days Post-Op  Subjective:  Denies pain in neck. Denies shortness of breath or difficulty swallowing.    Filed Vitals:   04/27/14 0635  BP: 126/48  Pulse: 88  Temp: 98.2 F (36.8 C)  Resp: 18    Physical Exam: Right neck incision dry. Mild firmness around incision. Likely scar tissue forming.   CBC    Component Value Date/Time   WBC 9.6 04/25/2014 0525   RBC 3.59* 04/25/2014 0525   RBC 4.03 06/01/2011 1416   HGB 8.9* 04/25/2014 0525   HCT 27.3* 04/25/2014 0525   PLT 304 04/25/2014 0525   MCV 76.0* 04/25/2014 0525   MCH 24.8* 04/25/2014 0525   MCHC 32.6 04/25/2014 0525   RDW 14.9 04/25/2014 0525   LYMPHSABS 4.5* 10/02/2010 1054   MONOABS 0.5 10/02/2010 1054   EOSABS 0.1 10/02/2010 1054   BASOSABS 0.0 10/02/2010 1054    BMET    Component Value Date/Time   NA 140 04/24/2014 0510   K 4.3 04/24/2014 0510   CL 101 04/24/2014 0510   CO2 29 04/24/2014 0510   GLUCOSE 123* 04/24/2014 0510   BUN 22 04/24/2014 0510   CREATININE 1.48* 04/24/2014 0510   CALCIUM 9.9 04/24/2014 0510   GFRNONAA 37* 04/24/2014 0510   GFRAA 42* 04/24/2014 0510    INR    Component Value Date/Time   INR 0.97 04/06/2014 0834     Intake/Output Summary (Last 24 hours) at 04/27/14 0845 Last data filed at 04/26/14 1754  Gross per 24 hour  Intake    120 ml  Output      1 ml  Net    119 ml     Assessment:  64 y.o. female is s/p: Right CEA with I & D of right seroma 3 Days Post-Op  Plan: Mild swelling around incision. Do not suspect any active bleeding. Patient asymptomatic. Advised patient to monitor neck closely at home.  Cultures still negative to date. Discharge home today with keflex.    Joanne Jock, PA-C [facility] Office: [phone] Pager: [phone] 04/27/2014 8:45 AM   Some recurrent swelling but not as much as before Cultures remain negative Will d/c home on keflex Follow  up 2 weeks.  Ruta Hinds, MD [facility] [place] Office: [phone] Pager: [phone]

## 2014-04-27 NOTE — Discharge Summary (Signed)
[facility] Discharge Summary  Joanne Morris [birth year] 64 y.o. female  [id number]  Admission Date: 04/23/2014  Discharge Date: 04/27/2014  Physician: Ruta Hinds, MD  Admission Diagnosis: Hematoma of neck, initial encounter [S10.93XA]  HPI:   This is a 64 y.o. female who presented for evaluation of right neck swelling. Pt underwent right CEA earlier this week. She has had progressive swelling right neck slowly over the last several days. Pt really with no complaints but came to the ER at the suggestion of her daughter. She is on Plavix Aspirin Pletal and Mobic. She denies any fever chills or drainage. She denies difficulty swallowing or trouble breathing. She denies any symptoms of TIA amaurosis or stroke. Other medical problems include CAD, PAD, hypertension, elevated cholesterol, diabetes all of which are stable.  Hospital Course:  The patient was admitted to the hospital and taken to the operating room on 04/24/14 and underwent: incision and drainage of right neck. This revealed clear drainage and was not a hematoma. A fluid culture was taken.   The patient tolerated the procedure well and was transported to the PACU in stable condition.   The patient did well on PODs 1-3. She had minimal drainage from her JP drain and it was discontinued on POD 2. Her plavix and pletal were reordered on POD 2. On POD 3, she had some recurrent swelling of her right neck but was not as much as before. She denied any pain or issues with breathing or swallowing. She was advised to closely monitor her neck at home. Her fluid culture had not grown anything but were still pending. She was discharged home on POD 3 in good condition. She was sent home on 2 weeks of keflex.    CBC    Component Value Date/Time   WBC 9.6 04/25/2014 0525   RBC 3.59* 04/25/2014 0525   RBC 4.03 06/01/2011 1416   HGB 8.9* 04/25/2014 0525   HCT 27.3* 04/25/2014 0525   PLT 304 04/25/2014 0525   MCV  76.0* 04/25/2014 0525   MCH 24.8* 04/25/2014 0525   MCHC 32.6 04/25/2014 0525   RDW 14.9 04/25/2014 0525   LYMPHSABS 4.5* 10/02/2010 1054   MONOABS 0.5 10/02/2010 1054   EOSABS 0.1 10/02/2010 1054   BASOSABS 0.0 10/02/2010 1054    BMET    Component Value Date/Time   NA 140 04/24/2014 0510   K 4.3 04/24/2014 0510   CL 101 04/24/2014 0510   CO2 29 04/24/2014 0510   GLUCOSE 123* 04/24/2014 0510   BUN 22 04/24/2014 0510   CREATININE 1.48* 04/24/2014 0510   CALCIUM 9.9 04/24/2014 0510   GFRNONAA 37* 04/24/2014 0510   GFRAA 42* 04/24/2014 0510     Discharge Instructions:   The patient is discharged to home with extensive instructions on wound care and progressive ambulation.  They are instructed not to drive or perform any heavy lifting until returning to see the physician in his office.  Discharge Instructions    CAROTID Sugery: Call MD for difficulty swallowing or speaking; weakness in arms or legs that is a new symtom; severe headache.  If you have increased swelling in the neck and/or  are having difficulty breathing, CALL 911    Complete by:  As directed      Call MD for:  redness, tenderness, or signs of infection (pain, swelling, bleeding, redness, odor or green/yellow discharge around incision site)    Complete by:  As directed      Call MD for:  severe or increased pain, loss or decreased feeling  in affected limb(s)    Complete by:  As directed      Call MD for:  temperature >100.5    Complete by:  As directed      Discharge wound care:    Complete by:  As directed   Wash neck wound daily with soap and water and pat dry.     Driving Restrictions    Complete by:  As directed   No driving for 1 week and while on pain medication     Increase activity slowly    Complete by:  As directed   Walk with assistance use walker or cane as needed     Lifting restrictions    Complete by:  As directed   No lifting for 1 week     Resume previous diet    Complete by:  As directed             Discharge Diagnosis:  Hematoma of neck, initial encounter [S10.93XA]  Secondary Diagnosis: Patient Active Problem List   Diagnosis Date Noted  . Hematoma of neck 04/23/2014  . Carotid stenosis, asymptomatic 04/18/2014  . Carotid artery stenosis, asymptomatic 02/07/2014  . PAD (peripheral artery disease) 07/20/2013  . Peripheral arterial disease 06/08/2013  . Abdominal pain 10/11/2012  . Dyslipidemia 10/11/2012  . Diabetes mellitus 10/11/2012  . Depression 10/11/2012  . Radiculopathy of leg 10/11/2012  . Atypical chest pain 05/31/2011  . CAD (coronary artery disease) 05/31/2011  . Hypokalemia 05/31/2011  . HTN (hypertension) 05/31/2011  . Anemia 05/31/2011  . Leucocytosis 05/31/2011  . DM2 (diabetes mellitus, type 2) 05/31/2011  . Hyperlipemia 05/31/2011   Past Medical History  Diagnosis Date  . Hypertension   . Coronary artery disease   . Angina   . Depression   . Claudication   . PAD (peripheral artery disease)   . High cholesterol   . Myocardial infarction 1990's    "1"  . Type II diabetes mellitus   . GERD (gastroesophageal reflux disease)   . Stroke     "mini stroke 1st then regular stroke", denies residual on 07/20/2013  . Arthritis     "knees" (07/20/2013)  . Anxiety   . Carotid stenosis   . Carotid stenosis   . Wears glasses        Medication List    TAKE these medications        ALPRAZolam 0.25 MG tablet  Commonly known as:  XANAX  Take 0.25 mg by mouth daily as needed for anxiety.     amLODipine 10 MG tablet  Commonly known as:  NORVASC  Take 10 mg by mouth daily.     aspirin EC 81 MG tablet  Take 81 mg by mouth daily.     cephALEXin 500 MG capsule  Commonly known as:  KEFLEX  Take 1 capsule (500 mg total) by mouth 2 (two) times daily.     cilostazol 100 MG tablet  Commonly known as:  PLETAL  Take 100 mg by mouth 2 (two) times daily.     clopidogrel 75 MG tablet  Commonly known as:  PLAVIX  Take 1 tablet (75 mg total) by  mouth daily.     HYDROcodone-acetaminophen 5-325 MG per tablet  Commonly known as:  NORCO/VICODIN  Take 1 tablet by mouth every 6 (six) hours as needed for moderate pain.     lisinopril-hydrochlorothiazide 20-12.5 MG per tablet  Commonly known as:  PRINZIDE,ZESTORETIC  Take 1  tablet by mouth 2 (two) times daily.     meloxicam 7.5 MG tablet  Commonly known as:  MOBIC  Take 7.5 mg by mouth daily as needed for pain.     metFORMIN 500 MG tablet  Commonly known as:  GLUCOPHAGE  Take 1 tablet (500 mg total) by mouth daily with breakfast.     metoprolol 50 MG tablet  Commonly known as:  LOPRESSOR  Take 50 mg by mouth 2 (two) times daily.     niacin 1000 MG CR tablet  Commonly known as:  NIASPAN  Take 1 tablet by mouth daily.     nitroGLYCERIN 0.4 MG SL tablet  Commonly known as:  NITROSTAT  Place 0.4 mg under the tongue every 5 (five) minutes as needed for chest pain.     omeprazole 20 MG capsule  Commonly known as:  PRILOSEC  Take 20 mg by mouth daily.     pravastatin 40 MG tablet  Commonly known as:  PRAVACHOL  Take 40 mg by mouth daily.     tiZANidine 4 MG tablet  Commonly known as:  ZANAFLEX  Take 4 mg by mouth at bedtime as needed for muscle spasms.     traMADol 50 MG tablet  Commonly known as:  ULTRAM  Take 1 tablet (50 mg total) by mouth every 6 (six) hours as needed for moderate pain.     zolpidem 5 MG tablet  Commonly known as:  AMBIEN  Take 5 mg by mouth at bedtime as needed for sleep.        Disposition: Home  Patient's condition: is Good  Follow up: 1. Dr. [name] in 2 weeks   Virgina Jock, PA-C [facility] [phone] 04/27/2014  1:15 PM

## 2014-04-28 LAB — BODY FLUID CULTURE: Culture: NO GROWTH

## 2014-05-04 DIAGNOSIS — I10 Essential (primary) hypertension: Secondary | ICD-10-CM | POA: Diagnosis not present

## 2014-05-04 DIAGNOSIS — I6523 Occlusion and stenosis of bilateral carotid arteries: Secondary | ICD-10-CM | POA: Diagnosis not present

## 2014-05-04 DIAGNOSIS — Z9889 Other specified postprocedural states: Secondary | ICD-10-CM | POA: Diagnosis not present

## 2014-05-04 DIAGNOSIS — I739 Peripheral vascular disease, unspecified: Secondary | ICD-10-CM | POA: Diagnosis not present

## 2014-05-10 ENCOUNTER — Encounter: Payer: Self-pay | Admitting: Vascular Surgery

## 2014-05-11 ENCOUNTER — Ambulatory Visit (INDEPENDENT_AMBULATORY_CARE_PROVIDER_SITE_OTHER): Payer: Self-pay | Admitting: Vascular Surgery

## 2014-05-11 ENCOUNTER — Encounter: Payer: Self-pay | Admitting: Vascular Surgery

## 2014-05-11 VITALS — BP 173/83 | HR 93 | Resp 16 | Ht 59.0 in | Wt 124.0 lb

## 2014-05-11 DIAGNOSIS — I6523 Occlusion and stenosis of bilateral carotid arteries: Secondary | ICD-10-CM

## 2014-05-11 NOTE — Progress Notes (Signed)
Patient is a 64 year old female who returns for postoperative follow-up today. She underwent left carotid endarterectomy several months ago. She subsequently underwent right carotid endarterectomy in early March. This was complicated by a postop seroma. Cultures were taken of this which were negative. She was placed on Keflex for 2 weeks. She has completed her course of antibiotics. She denies any further drainage or increased swelling in the right neck.  Physical exam:  Filed Vitals:   05/11/14 1544 05/11/14 1546 05/11/14 1547 05/11/14 1548  BP: 177/91 187/72 186/86 173/83  Pulse: 94 93 96 93  Resp: 16     Height: 4\' 11"  (1.499 m)     Weight: 124 lb (56.246 kg)       Neck: Left neck incision well-healed, right neck incision well-healed no drainage no erythema slightly more edematous than left side not fluctuant  Neuro: Symmetric upper extremity and lower extremity motor strength 5 over 5 no difficulty swallowing tongue midline  Assessment: Doing well status post bilateral carotid endarterectomies. The patient will follow-up with carotid duplex scan in 6 months.  She was hypertensive in our office today. She states she has follow-up scheduled with Dr. Alain Marion next week to further titrate her blood pressure medications. I encouraged her to check her blood pressure at home and right down a number once daily. That way Dr. [name] can have some data to look out for her home blood pressure  Ruta Hinds, MD [facility] [place]: [phone] Pager: [phone]

## 2014-05-11 NOTE — Progress Notes (Signed)
Filed Vitals:   05/11/14 1544 05/11/14 1546 05/11/14 1547 05/11/14 1548  BP: 177/91 187/72 186/86 173/83  Pulse: 94 93 96 93  Resp: 16     Height: 4\' 11"  (1.499 m)     Weight: 124 lb (56.246 kg)      Body mass index is 25.03 kg/(m^2).

## 2014-05-12 DIAGNOSIS — I6523 Occlusion and stenosis of bilateral carotid arteries: Secondary | ICD-10-CM | POA: Diagnosis not present

## 2014-05-12 DIAGNOSIS — E784 Other hyperlipidemia: Secondary | ICD-10-CM | POA: Diagnosis not present

## 2014-05-12 DIAGNOSIS — I2510 Atherosclerotic heart disease of native coronary artery without angina pectoris: Secondary | ICD-10-CM | POA: Diagnosis not present

## 2014-05-12 DIAGNOSIS — I10 Essential (primary) hypertension: Secondary | ICD-10-CM | POA: Diagnosis not present

## 2014-05-12 DIAGNOSIS — E119 Type 2 diabetes mellitus without complications: Secondary | ICD-10-CM | POA: Diagnosis not present

## 2014-05-16 DIAGNOSIS — I10 Essential (primary) hypertension: Secondary | ICD-10-CM | POA: Diagnosis not present

## 2014-05-21 DIAGNOSIS — F419 Anxiety disorder, unspecified: Secondary | ICD-10-CM | POA: Diagnosis not present

## 2014-05-22 DIAGNOSIS — F419 Anxiety disorder, unspecified: Secondary | ICD-10-CM | POA: Diagnosis not present

## 2014-05-23 DIAGNOSIS — F419 Anxiety disorder, unspecified: Secondary | ICD-10-CM | POA: Diagnosis not present

## 2014-05-24 DIAGNOSIS — F419 Anxiety disorder, unspecified: Secondary | ICD-10-CM | POA: Diagnosis not present

## 2014-05-25 DIAGNOSIS — F419 Anxiety disorder, unspecified: Secondary | ICD-10-CM | POA: Diagnosis not present

## 2014-05-26 DIAGNOSIS — F419 Anxiety disorder, unspecified: Secondary | ICD-10-CM | POA: Diagnosis not present

## 2014-05-27 DIAGNOSIS — F419 Anxiety disorder, unspecified: Secondary | ICD-10-CM | POA: Diagnosis not present

## 2014-05-28 DIAGNOSIS — F419 Anxiety disorder, unspecified: Secondary | ICD-10-CM | POA: Diagnosis not present

## 2014-05-29 DIAGNOSIS — F419 Anxiety disorder, unspecified: Secondary | ICD-10-CM | POA: Diagnosis not present

## 2014-05-30 DIAGNOSIS — F419 Anxiety disorder, unspecified: Secondary | ICD-10-CM | POA: Diagnosis not present

## 2014-05-31 DIAGNOSIS — F419 Anxiety disorder, unspecified: Secondary | ICD-10-CM | POA: Diagnosis not present

## 2014-06-01 DIAGNOSIS — F419 Anxiety disorder, unspecified: Secondary | ICD-10-CM | POA: Diagnosis not present

## 2014-06-02 DIAGNOSIS — F419 Anxiety disorder, unspecified: Secondary | ICD-10-CM | POA: Diagnosis not present

## 2014-06-03 DIAGNOSIS — F419 Anxiety disorder, unspecified: Secondary | ICD-10-CM | POA: Diagnosis not present

## 2014-06-03 DIAGNOSIS — M179 Osteoarthritis of knee, unspecified: Secondary | ICD-10-CM | POA: Diagnosis not present

## 2014-06-03 DIAGNOSIS — E119 Type 2 diabetes mellitus without complications: Secondary | ICD-10-CM | POA: Diagnosis not present

## 2014-06-03 DIAGNOSIS — K5900 Constipation, unspecified: Secondary | ICD-10-CM | POA: Diagnosis not present

## 2014-06-03 DIAGNOSIS — I6523 Occlusion and stenosis of bilateral carotid arteries: Secondary | ICD-10-CM | POA: Diagnosis not present

## 2014-06-03 DIAGNOSIS — I10 Essential (primary) hypertension: Secondary | ICD-10-CM | POA: Diagnosis not present

## 2014-06-04 DIAGNOSIS — F419 Anxiety disorder, unspecified: Secondary | ICD-10-CM | POA: Diagnosis not present

## 2014-06-05 DIAGNOSIS — F419 Anxiety disorder, unspecified: Secondary | ICD-10-CM | POA: Diagnosis not present

## 2014-06-06 DIAGNOSIS — F419 Anxiety disorder, unspecified: Secondary | ICD-10-CM | POA: Diagnosis not present

## 2014-06-07 DIAGNOSIS — I70213 Atherosclerosis of native arteries of extremities with intermittent claudication, bilateral legs: Secondary | ICD-10-CM | POA: Diagnosis not present

## 2014-06-07 DIAGNOSIS — I739 Peripheral vascular disease, unspecified: Secondary | ICD-10-CM | POA: Diagnosis not present

## 2014-06-07 DIAGNOSIS — F419 Anxiety disorder, unspecified: Secondary | ICD-10-CM | POA: Diagnosis not present

## 2014-06-08 ENCOUNTER — Telehealth: Payer: Self-pay

## 2014-06-08 DIAGNOSIS — F419 Anxiety disorder, unspecified: Secondary | ICD-10-CM | POA: Diagnosis not present

## 2014-06-08 NOTE — Telephone Encounter (Addendum)
Phone call from pt. with request for refill on Hydrocodone/ Acetaminophen.  Reported pain at 3/10.  Denied redness, swelling, or drainage from right neck incision.  Denied fever/ chills.  Questioned if she has tried any OTC analgesics?  Stated that nothing works as well as the Hydrocodone.  Reported noticing a white string hanging out of the incision.  Advised since she is 6 wks. Post op from her last procedure, she would need to be reevaluated in the office re: refill of Hydrocodone.  Appt. offered for 06/09/14 @ 2:00 PM; stated unable to arrange transportation.  Appt. given for 06/16/14 @ 10:00 AM with NP. Pt. agreed.

## 2014-06-09 DIAGNOSIS — F419 Anxiety disorder, unspecified: Secondary | ICD-10-CM | POA: Diagnosis not present

## 2014-06-10 DIAGNOSIS — F419 Anxiety disorder, unspecified: Secondary | ICD-10-CM | POA: Diagnosis not present

## 2014-06-14 DIAGNOSIS — G5751 Tarsal tunnel syndrome, right lower limb: Secondary | ICD-10-CM | POA: Diagnosis not present

## 2014-06-14 DIAGNOSIS — M792 Neuralgia and neuritis, unspecified: Secondary | ICD-10-CM | POA: Diagnosis not present

## 2014-06-14 DIAGNOSIS — M722 Plantar fascial fibromatosis: Secondary | ICD-10-CM | POA: Diagnosis not present

## 2014-06-15 ENCOUNTER — Encounter: Payer: Self-pay | Admitting: Family

## 2014-06-16 ENCOUNTER — Ambulatory Visit: Payer: Medicare Other | Admitting: Family

## 2014-06-18 DIAGNOSIS — F419 Anxiety disorder, unspecified: Secondary | ICD-10-CM | POA: Diagnosis not present

## 2014-06-20 DIAGNOSIS — F419 Anxiety disorder, unspecified: Secondary | ICD-10-CM | POA: Diagnosis not present

## 2014-06-21 DIAGNOSIS — F419 Anxiety disorder, unspecified: Secondary | ICD-10-CM | POA: Diagnosis not present

## 2014-06-22 DIAGNOSIS — F419 Anxiety disorder, unspecified: Secondary | ICD-10-CM | POA: Diagnosis not present

## 2014-06-23 DIAGNOSIS — F419 Anxiety disorder, unspecified: Secondary | ICD-10-CM | POA: Diagnosis not present

## 2014-06-24 DIAGNOSIS — F419 Anxiety disorder, unspecified: Secondary | ICD-10-CM | POA: Diagnosis not present

## 2014-06-24 DIAGNOSIS — I2510 Atherosclerotic heart disease of native coronary artery without angina pectoris: Secondary | ICD-10-CM | POA: Diagnosis not present

## 2014-06-24 DIAGNOSIS — Z9889 Other specified postprocedural states: Secondary | ICD-10-CM | POA: Diagnosis not present

## 2014-06-24 DIAGNOSIS — I739 Peripheral vascular disease, unspecified: Secondary | ICD-10-CM | POA: Diagnosis not present

## 2014-06-24 DIAGNOSIS — E782 Mixed hyperlipidemia: Secondary | ICD-10-CM | POA: Diagnosis not present

## 2014-06-29 DIAGNOSIS — I739 Peripheral vascular disease, unspecified: Secondary | ICD-10-CM | POA: Diagnosis not present

## 2014-06-29 DIAGNOSIS — Z01810 Encounter for preprocedural cardiovascular examination: Secondary | ICD-10-CM | POA: Diagnosis not present

## 2014-07-03 NOTE — H&P (Signed)
OFFICE VISIT NOTES COPIED TO EPIC FOR DOCUMENTATION   Joanne Morris 06/24/2014 8:54 AM Location: [facility] Patient #: [id number] DOB: Mar 18, 1950 Separated / Language: Cleophus Molt / Race: Black or African American Female    History of Present Illness(Jagadeesh Carlynn Herald, MD; 06/24/2014 6:34 PM) Patient words: 3 week lab F/U  The patient is a 64 year old female who presents for a follow-up for Leg claudication. Ms. Brilee Port is a 64 year old African-American female with history of hypertension, hyperlipidemia, diabetes mellitus, CAD and h/o bilateral carotid endarterectomy by dr. [name] left in Dec 2015 and right March 2016.Marland Kitchen She has known coronary artery disease and states that she has had angioplasty and stent implantation about 7 years ago at [facility], she has not had any recurrence of chest pain. She denies any symptoms to suggest TIA, no recent weight changes.  She had undergone peripheral arteriogram on 10/05/2013 and had successfull atherectomy of the occluded right peroneal artery, with excellent results. However over the past month, has developed worsening symptoms of claudication of her right calf and feet. She states that the pain is so severe that she has started to see a podiatrist and has started to take oxycodone. She underwent lower extremity arterial duplex. I'd seen about one month ago. She now presents here for follow-up.  Has quit smoking and has remained abstinant. Her BP has been elevated since CEA.     Problem List/Past Medical(Charavina Reader; 06/24/2014 1:40 PM) Arthritis (M19.90) Diabetes type 2, controlled (E11.9) Diabetes mellitus type 2 with peripheral artery disease (E11.51) Preop cardiovascular exam (Z01.810). Scheduled for left ICA endarterectomy if stenosis confirmed by Dr. Ruta Hinds. Myalgia (M79.1). Labs 06/16/2013: Serum glucose 139 mg, BUN 16, serum creatinine 1.01 with eGFR 69 mL. Potassium  was 3.7. CMP was otherwise normal except minimally elevated serum calcium at 10.5. Serum CPK was normal at 107. Labs do not suggest rhabdomyolysis labs are stable. Microcytic anemia (D50.9). Labs 05/16/2014: HB 10.2/HCT 30.6 with microcytic indices  Labs 09/30/2013: HB 10.3/HCT 30.4. MCV reduced at 74, other indicis normal. RDW 15.0.  Labs 06/03/205: HB 10.3/HCT 30.3. Platelet count 390. Tachycardia (R00.0) GERD (gastroesophageal reflux disease) (K21.9) Anxiety (F41.9) History of CVA (cerebrovascular accident) without residual deficits (Z86.73). 09/28/1990 Atherosclerosis of native coronary artery of native heart without angina pectoris (I25.10). 05/15/2009 Coronary angiogram 02/01/2010: RCA stent patent with mild in-stent restenosis(40%), mild diffuse coronary artery disease, moderate disease in the LAD. Mild inferobasal hypokinesis. PAD (peripheral artery disease) (I73.9). 05/25/2013 Arteriogram 10/05/2013: CSI diamondback atherectomy of the right peroneal artery with excellent result. I vessel run off to the ankle. One vessel runoff in the left lower extremity below the knee in the form of peroneal artery. Mild disease bilateral femoral arteries.  Lower extremity arterial duplex 11/16/2013: No hemodynamically significant stenoses are identified in the right lower extremity arterial system. This exam reveals mildly decreased perfusion of the right lower extremity, noted at the post tibial artery level. Right peroneal artery atherectomy site appears to be patent as evidenced by improved flow and improved ABI at the level of the ankle, ABI improved from 0.76 to 0.90 when compared to 06/21/2013.  LE arterial duplex 06/21/2013: No hemodynamically significant stenosis is identified on either side. This exam reveals moderately decreased perfusion of both the right lower extremities, noted at the post tibial artery level. Bilateral ABI 0.71. Study suggests diffuse bilateral below knee small vessel  disease. Benign essential hypertension (I10). 05/25/2013 Labs 05/16/2014: serum glucose 161, creatinine 0.98, CMP otherwise normal  Labs 04/25/2014: HB 8.9/HCT 27.3 with microcytic indicis. He 04/24/2014: BUN 22, serum creatinine 1.4 rate, eGFR 42 mL. Compared to 04/19/2014, eGFR reduced from 72. Serum creatinine was 0.95.  Labs 09/30/2013: HB 10.3/HCT 30.4. Microcytic indicis. Serum glucose 135 mg, elevated. BMP otherwise normal with a serum creatinine of 0.93, eGFR 76 mL.  Labs 06/16/2013: HB 10.9/HCT 32.5, microcytic indicis with MCV 75, MCH 25.2. CMP reveals elevated blood sugar 146, nonfasting. BUN 16, serum creatinine 0.88. Hypercholesterolemia with hypertriglyceridemia (E78.2). 05/25/2013 Labs 05/16/2014: Total cholesterol 176, triglycerides 79, HDL 90, LDL 70, LDL particle # 639, LP-IR score 25, TSH 0.737  Labs 01/20/2013: Total cholesterol 233, triglycerides 504, HDL 48, LDL 70 mg. Lp(a) normal at 6 mg. CMP was normal, uric acid elevated at 8.5. TSH normal. History of bilateral carotid endarterectomy (Z98.89). 01/19/2014 s/p left carotid endarterectomy (01/19/2014) and right 04/20/2014- Dr. Ruta Hinds.    Allergies(Charavina Reader; 07-01-2014 1:40 PM) No Known Drug Allergies. 05/25/2013    Family History(Charavina Reader; 2014/07/01 1:40 PM) Father. Deceased. no known history Siblings. 4, no known heart problems Mother. Deceased. not sure Pt was a baby, died from cancer    Social History(Charavina Reader; 07-01-14 1:40 PM) Non Drinker/No Alcohol Use Living Situation. Lives alone. Current tobacco use. Former smoker. stop smoking 3 years ago Marital status. Separated. Number of Children. 3 gave up one for adoption    Past Surgical History(Charavina Reader; Jul 01, 2014 1:40 PM) Hysterectomy; Total. in her 20's stent. 2010 Endarterectomy. 04/20/2014 Right. Left-02/07/2014    Medication History(Jagadeesh R Orlander Norwood, MD; Jul 01, 2014 2:44 PM) Niacin  ER (Antihyperlipidemic) (1000MG  Tablet ER, 1 (one) Oral daily in the evening 30 minutes after aspirin, Taken starting 04/19/2014) Active. Aspirin (81MG  Tablet, 1 Oral daily in the evening 30 minutes before Niaspan, Taken starting 05/25/2013) Active. Cilostazol (100MG  Tablet, 1 (one) Tablet Oral two times daily, Taken starting 03/11/2014) Active. Pravastatin Sodium (40MG  Tablet, 1 Oral daily in the evening., Taken starting 06/15/2013) Active. Clopidogrel Bisulfate (75MG  Tablet, 1 Oral daily, Taken starting 05/25/2014) Active. (Pt started while in the hospital. P/A from [facility] through 10/07/2013.) Metoprolol Tartrate (50MG  Tablet, 1 (one) Tablet Oral two times daily, Taken starting 05/04/2014) Active. TraMADol HCl (50MG  Tablet, 1 Oral every 6 hours as needed for pain, Taken starting 05/10/2014) Active. (Medication Review 05/06/2014: Multiple Rx for pain meds. But none in March.) MetFORMIN HCl (500MG  Tablet, 1 Oral daily, Taken starting 02/25/2012) Active. Nitrostat (0.4MG  Tab Sublingual, 1 Sublingual daily) Active: CAD. Omeprazole (20MG  Tablet DR, 1 Oral daily) Active. Lisinopril-Hydrochlorothiazide (20-12.5MG  Tablet, 1 Oral two times daily) Active. ALPRAZolam (0.25MG  Tablet Disperse, 1 Oral daily) Active. AmLODIPine Besylate (10MG  Tablet, 1 Oral daily) Active. Diclofenac Sodium (75MG  Tablet DR, 1 Oral two times daily after meals) Active. Zolpidem Tartrate (10MG  Tablet, 1 Oral at bedtime) Active. Hydrocodone-Acetaminophen (5-325MG  Tablet, 1 Oral every 6 hours as needed) Active. Cephalexin (500MG  Capsule, 1 Oral two times daily, Stopped taking Jul 01, 2014) Discontinued: completed course. Ibuprofen (600MG  Tablet, 1 Oral as needed, Stopped taking 04/12/2014) Discontinued: efficacy. TiZANidine HCl (4MG  Tablet, 1 Oral as needed) Active. Medications Reconciled.    Diagnostic Studies History(Charavina Reader; 07/01/14 1:40 PM) Lower Extremity Dopplers. 06/07/2014 No hemodynamically significant  stenoses are identified in the bilateral lower extremity arterial system. This exam reveals mildly decreased perfusion of the lower extremity bilaterally. Bilateral ABI 0.91. There is diffuse fibrocalcific disease. Treadmill stress test. not sure of date Endoscopy. she was young not sure of date Coronary Angiogram. 2010 1 stent placed. 07/2013, 06/2013: Patent stents Colonoscopy. 2015 removed polyps Lower Extremity  Angiogram. 11/16/2013 CSI diamondback atherectomy of the right peroneal artery with excellent result. I vessel run off to the ankle. One vessel runoff in the left lower extremity below the knee in the form of peroneal artery. Mild disease bilateral femoral arteries. LE arterial duplex: . 11/16/2013 No hemodynamically significant stenoses are identified in the right lower extremity arterial system. This exam reveals mildly decreased perfusion of the right lower extremity, noted at the post tibial artery level. Right peroneal artery atherectomy site appears to be patent as evidenced by improved flow and improved ABI at the level of the ankle, ABI improved from 0.76-0.90.    Review of Systems(Jagadeesh Carlynn Herald, MD; 06/24/2014 6:35 PM) General:Not Present- Fever and Night Sweats. Skin:Not Present- Itching and Rash. HEENT:Not Present- Headache. Respiratory:Not Present- Difficulty Breathing. Cardiovascular:Present- Claudications(right calf and feet), Elevated Blood Pressure and Rapid Heart Rate. Not Present- Chest Pain, Edema, Fainting, Orthopnea and Swelling of Extremities. Gastrointestinal:Not Present- Abdominal Pain, Constipation, Diarrhea, Nausea and Vomiting. Musculoskeletal:Present- Backache (neck pain) and Joint Pain. Not Present- Joint Swelling. Neurological:Not Present- Headaches. Hematology:Not Present- Blood Clots, Easy Bruising and Nose Bleed. All other systems negative    Vitals(Jagadeesh Carlynn Herald, MD; 06/24/2014 2:23 PM) 06/24/2014 1:41 PM Weight: 126 lb  Height: 61 in Body Surface Area: 1.57 m Body Mass Index: 23.81 kg/m Pulse: 78 (Regular) P.OX: 98% (Room air) BP: 182/80 (Sitting, Left Arm, Standard)     Physical Exam(Jagadeesh Carlynn Herald, MD; 06/24/2014 2:37 PM) The physical exam findings are as follows:   General Mental Status- Alert. General Appearance- Cooperative and Appears stated age. Not in acute distress. Orientation- Oriented X3. Build & Nutrition- Well nourished and Moderately built.   Head and Neck Thyroid Gland Characteristics- no palpable nodules. no palpable enlargement.   Chest and Lung Exam Palpation:Tender- No chest wall tenderness. Auscultation: Breath sounds:- Clear.   Cardiovascular Inspection:Jugular vein- Right- No Distention. Auscultation:Heart Sounds- S1 WNL, S2 WNL and No gallop present. Murmurs & Other Heart Sounds: Murmur:- No murmur.   Abdomen Palpation/Percussion:Palpation and Percussion of the abdomen reveal - Non Tender and No hepatosplenomegaly. Auscultation:Auscultation of the abdomen reveals - Bowel sounds normal.   Peripheral Vascular Lower Extremity:Inspection- Left- No Pigmentation or Varicose veins. Right- No Pigmentation or Varicose veins. Palpation:Edema- Bilateral- 1+ Pitting edema(at the ankle). Femoral pulse- Bilateral- 2+. Popliteal pulse- Left- 2+. Right- Absent. Dorsalis pedis pulse- Left- Feeble. Right- 1+. Posterior tibial pulse- Left- Feeble. Right- 1+. Carotid arteries- Bilateral- No Carotid bruit(bilateral CEA scars noted). Abdomen- No prominent abdominal aortic pulsation or epigastric bruit.   Neurologic Motor:- Grossly intact without any focal deficits.   Musculoskeletal Global Assessment Left Lower Extremity - normal range of motion without pain. Right Lower Extremity- normal range of motion without pain.   Assessment & Plan(Jagadeesh Carlynn Herald, MD; 06/24/2014 6:36 PM) PAD (peripheral artery  disease) (I73.9) Story: Arteriogram 10/05/2013: CSI diamondback atherectomy of the right peroneal artery with excellent result. I vessel run off to the ankle. One vessel runoff in the left lower extremity below the knee in the form of peroneal artery without significant disease. Mild disease bilateral femoral arteries.  Lower extremity arterial duplex 06/07/2014: No hemodynamically significant stenoses are identified in the bilateral lower extremity arterial system. This exam reveals mildly decreased perfusion of the lower extremity bilaterally. Bilateral ABI 0.91. There is diffuse fibrocalcific disease. Right peroneal artery atherectomy site appears to be patent No significant change from 11/16/2013.  Atherosclerosis of native coronary artery of native heart without angina pectoris (I25.10) Story: Coronary angiogram 02/01/2010: RCA stent patent  with mild in-stent restenosis(40%), mild diffuse coronary artery disease, moderate disease in the LAD. Mild inferobasal hypokinesis. Impression: EKG 05/25/2013: Normal sinus rhythm at rate of 74 bpm, left atrial abnormality. Borderline criteria for LVH with repolarization abnormality, cannot exclude inferior and lateral ischemia. There is minimal downsloping ST segment depression in the inferior leads with T-wave inversion in inferior and lateral leads.  History of bilateral carotid endarterectomy (M21.11) Story: s/p left carotid endarterectomy (01/19/2014) and right 04/20/2014- Dr. Ruta Hinds.  Hypercholesterolemia with hypertriglyceridemia (E78.2) Story: Labs 05/16/2014: Total cholesterol 176, triglycerides 79, HDL 90, LDL 70, LDL particle # 639, LP-IR score 25, TSH 0.737  Labs 01/20/2013: Total cholesterol 233, triglycerides 504, HDL 48, LDL 70 mg. Lp(a) normal at 6 mg. CMP was normal, uric acid elevated at 8.5. TSH normal.  Diabetes type 2, controlled (E11.9)  Benign essential hypertension (I10) Story: Labs 05/16/2014: serum glucose 161, creatinine  0.98, CMP otherwise normal  Labs 04/25/2014: HB 8.9/HCT 27.3 with microcytic indicis. He 04/24/2014: BUN 22, serum creatinine 1.4 rate, eGFR 42 mL. Compared to 04/19/2014, eGFR reduced from 72. Serum creatinine was 0.95.  Labs 09/30/2013: HB 10.3/HCT 30.4. Microcytic indicis. Serum glucose 135 mg, elevated. BMP otherwise normal with a serum creatinine of 0.93, eGFR 76 mL.  Labs 06/16/2013: HB 10.9/HCT 32.5, microcytic indicis with MCV 75, MCH 25.2. CMP reveals elevated blood sugar 146, nonfasting. BUN 16, serum creatinine 0.88. Current Plans l Discontinued Lisinopril-Hydrochlorothiazide 20-12.5MG  (efficacy). l Started Valsartan-Hydrochlorothiazide 320-25MG , 1 (one) Tablet every morning, #30, 06/24/2014, Ref. x2. Discontinue Lisinopril l Mechanism of underlying disease process and action of medications discussed with the patient. I discussed primary/secondary prevention and also dietary counceling was done. Patient presents to me for follow-up of peripheral arterial disease. Over the past 4-6 weeks she has noticed worsening symptoms of claudication that is similar to her medications prior to angioplasty of the right lower. Physical examination does reveal decreased right popliteal pulse. Although ABI and lower extremity arterial duplex performed recently showed stable results, due to her symptoms, patient states that even doing minimal household chores she has severe pain and cramping, in fact has gone to see Dr. and has been obtaining oxycodone tablets for pain is unusual for her. Hence I'll set her up for peripheral arteriogram to reevaluate her anatomy. I will see her back after the procedure. Blood pressure markedly elevated. Discontinue lisinopril and switched her to valsartan HCT 320/25 mg 1 by mouth every morning. Needs to schedule for peripheral arteriogram and possible angioplasty given symptoms. Patient understands the risks, benefits, alternatives including medical therapy, CT  angiography. Patient understands <1-2% risk of death, embolic complications, bleeding, infection, renal failure, urgent surgical revascularization, but not limited to these and wants to proceed.     Signed electronically by Laverda Page, MD (06/24/2014 6:37 PM)

## 2014-07-05 ENCOUNTER — Encounter (HOSPITAL_COMMUNITY): Payer: Self-pay | Admitting: Cardiology

## 2014-07-05 ENCOUNTER — Encounter (HOSPITAL_COMMUNITY)
Admission: RE | Disposition: A | Discharge status: Home / Self Care | Payer: Medicare Other | Source: Ambulatory Visit | Attending: Cardiology

## 2014-07-05 ENCOUNTER — Ambulatory Visit (HOSPITAL_COMMUNITY)
Admission: RE | Admit: 2014-07-05 | Discharge: 2014-07-05 | Disposition: A | Discharge status: Home / Self Care | Payer: Medicare Other | Source: Ambulatory Visit | Attending: Cardiology | Admitting: Cardiology

## 2014-07-05 DIAGNOSIS — I739 Peripheral vascular disease, unspecified: Secondary | ICD-10-CM | POA: Diagnosis not present

## 2014-07-05 DIAGNOSIS — E1151 Type 2 diabetes mellitus with diabetic peripheral angiopathy without gangrene: Secondary | ICD-10-CM | POA: Diagnosis not present

## 2014-07-05 DIAGNOSIS — E119 Type 2 diabetes mellitus without complications: Secondary | ICD-10-CM | POA: Insufficient documentation

## 2014-07-05 DIAGNOSIS — E782 Mixed hyperlipidemia: Secondary | ICD-10-CM | POA: Insufficient documentation

## 2014-07-05 DIAGNOSIS — I2510 Atherosclerotic heart disease of native coronary artery without angina pectoris: Secondary | ICD-10-CM | POA: Diagnosis not present

## 2014-07-05 DIAGNOSIS — M1990 Unspecified osteoarthritis, unspecified site: Secondary | ICD-10-CM | POA: Insufficient documentation

## 2014-07-05 DIAGNOSIS — E785 Hyperlipidemia, unspecified: Secondary | ICD-10-CM | POA: Diagnosis not present

## 2014-07-05 DIAGNOSIS — I10 Essential (primary) hypertension: Secondary | ICD-10-CM | POA: Diagnosis not present

## 2014-07-05 HISTORY — PX: PERIPHERAL VASCULAR CATHETERIZATION: SHX172C

## 2014-07-05 LAB — BASIC METABOLIC PANEL
ANION GAP: 12 (ref 5–15)
BUN: 29 mg/dL — AB (ref 6–20)
CALCIUM: 10.5 mg/dL — AB (ref 8.9–10.3)
CHLORIDE: 101 mmol/L (ref 101–111)
CO2: 27 mmol/L (ref 22–32)
CREATININE: 1.38 mg/dL — AB (ref 0.44–1.00)
GFR calc Af Amer: 46 mL/min — ABNORMAL LOW (ref >60)
GFR calc non Af Amer: 40 mL/min — ABNORMAL LOW (ref >60)
GLUCOSE: 173 mg/dL — AB (ref 65–99)
Potassium: 4.1 mmol/L (ref 3.5–5.1)
SODIUM: 140 mmol/L (ref 135–145)

## 2014-07-05 LAB — GLUCOSE, CAPILLARY
GLUCOSE-CAPILLARY: 173 mg/dL — AB (ref 65–99)
Glucose-Capillary: 166 mg/dL — ABNORMAL HIGH (ref 65–99)

## 2014-07-05 SURGERY — LOWER EXTREMITY ANGIOGRAPHY
Anesthesia: LOCAL

## 2014-07-05 MED ORDER — LIDOCAINE HCL (PF) 1 % IJ SOLN
INTRAMUSCULAR | Status: AC
  Filled 2014-07-05: qty 30

## 2014-07-05 MED ORDER — METFORMIN HCL 500 MG PO TABS
500.0000 mg | ORAL_TABLET | Freq: Every day | ORAL | Status: DC
Start: 2014-07-05 — End: 2018-08-07

## 2014-07-05 MED ORDER — MIDAZOLAM HCL 2 MG/2ML IJ SOLN
INTRAMUSCULAR | Status: DC | PRN
  Administered 2014-07-05: 2 mg via INTRAVENOUS

## 2014-07-05 MED ORDER — SODIUM CHLORIDE 0.9 % IV SOLN: 1.0000 mL/kg/h | INTRAVENOUS | Status: DC

## 2014-07-05 MED ORDER — FENTANYL CITRATE (PF) 100 MCG/2ML IJ SOLN
INTRAMUSCULAR | Status: AC
  Filled 2014-07-05: qty 2

## 2014-07-05 MED ORDER — SODIUM CHLORIDE 0.9 % IV SOLN
INTRAVENOUS | Status: DC
  Administered 2014-07-05: 08:00:00 via INTRAVENOUS

## 2014-07-05 MED ORDER — HEPARIN (PORCINE) IN NACL 2-0.9 UNIT/ML-% IJ SOLN
INTRAMUSCULAR | Status: AC
  Filled 2014-07-05: qty 1000

## 2014-07-05 MED ORDER — MIDAZOLAM HCL 2 MG/2ML IJ SOLN
INTRAMUSCULAR | Status: AC
  Filled 2014-07-05: qty 2

## 2014-07-05 MED ORDER — IODIXANOL 320 MG/ML IV SOLN
INTRAVENOUS | Status: DC | PRN
  Administered 2014-07-05: 65 mL via INTRA_ARTERIAL

## 2014-07-05 MED ORDER — FENTANYL CITRATE (PF) 100 MCG/2ML IJ SOLN
INTRAMUSCULAR | Status: DC | PRN
  Administered 2014-07-05: 50 ug
  Administered 2014-07-05: 50 ug via INTRAVENOUS

## 2014-07-05 MED ORDER — FENTANYL CITRATE (PF) 100 MCG/2ML IJ SOLN: 50.0000 ug | INTRAMUSCULAR | Status: DC | PRN

## 2014-07-05 SURGICAL SUPPLY — 10 items
CATH OMNI FLUSH 5F 65CM (CATHETERS) ×1 IMPLANT
HAND CONTROLLER AVANTA (MISCELLANEOUS) IMPLANT
KIT PV (KITS) ×2 IMPLANT
SET AVANTA MULTI PATIENT (MISCELLANEOUS) IMPLANT
SET AVANTA SINGLE PATIENT (MISCELLANEOUS) IMPLANT
SHEATH AVANTA HAND CONTROLLER (MISCELLANEOUS) IMPLANT
SHEATH PINNACLE 5F 10CM (SHEATH) ×1 IMPLANT
TRANSDUCER W/STOPCOCK (MISCELLANEOUS) ×2 IMPLANT
TRAY PV CATH (CUSTOM PROCEDURE TRAY) ×2 IMPLANT
WIRE HITORQ VERSACORE ST 145CM (WIRE) ×1 IMPLANT

## 2014-07-05 NOTE — Progress Notes (Signed)
41fr sheath aspirated and removed from left femoral artery. Manual pressure applied for 20 minutes, groin level 0. No S+S of hematoma, tegaderm dressing applied. Bedrest instructions given.  Bilateral DP pulses present with doppler.  Bedrest begins at 10:20:00

## 2014-07-05 NOTE — Interval H&P Note (Signed)
History and Physical Interval Note:  07/05/2014 9:11 AM  Joanne Morris  has presented today for surgery, with the diagnosis of pvd  The various methods of treatment have been discussed with the patient and family. After consideration of risks, benefits and other options for treatment, the patient has consented to  Procedure(s): Lower Extremity Angiography (N/A) and possible PTA  as a surgical intervention .  The patient's history has been reviewed, patient examined, no change in status, stable for surgery.  I have reviewed the patient's chart and labs.  Questions were answered to the patient's satisfaction.     [name]

## 2014-07-05 NOTE — Discharge Instructions (Signed)
Angiogram, Care After Refer to this sheet in the next few weeks. These instructions provide you with information on caring for yourself after your procedure. Your health care provider may also give you more specific instructions. Your treatment has been planned according to current medical practices, but problems sometimes occur. Call your health care provider if you have any problems or questions after your procedure.  WHAT TO EXPECT AFTER THE PROCEDURE After your procedure, it is typical to have the following sensations:  Minor discomfort or tenderness and a small bump at the catheter insertion site. The bump should usually decrease in size and tenderness within 1 to 2 weeks.  Any bruising will usually fade within 2 to 4 weeks. HOME CARE INSTRUCTIONS   You may need to keep taking blood thinners if they were prescribed for you. Take medicines only as directed by your health care provider.  Do not apply powder or lotion to the site.  Do not take baths, swim, or use a hot tub until your health care provider approves.  You may shower 24 hours after the procedure. Remove the bandage (dressing) and gently wash the site with plain soap and water. Gently pat the site dry.  Inspect the site at least twice daily.  Limit your activity for the first 48 hours. Do not bend, squat, or lift anything over 20 lb (9 kg) or as directed by your health care provider.  Plan to have someone take you home after the procedure. Follow instructions about when you can drive or return to work. SEEK MEDICAL CARE IF:  You get light-headed when standing up.  You have drainage (other than a small amount of blood on the dressing).  You have chills.  You have a fever.  You have redness, warmth, swelling, or pain at the insertion site. SEEK IMMEDIATE MEDICAL CARE IF:   You develop chest pain or shortness of breath, feel faint, or pass out.  You have bleeding, swelling larger than a walnut, or drainage from the  catheter insertion site.  You develop pain, discoloration, coldness, or severe bruising in the leg or arm that held the catheter.  You develop bleeding from any other place, such as the bowels. You may see bright red blood in your urine or stools, or your stools may appear black and tarry.  You have heavy bleeding from the site. If this happens, hold pressure on the site and call 911. MAKE SURE YOU:  Understand these instructions.  Will watch your condition.  Will get help right away if you are not doing well or get worse. Document Released: 08/16/2004 Document Revised: 06/14/2013 Document Reviewed: 06/22/2012 [facility] Patient Information 2015 [place], [place]. This information is not intended to replace advice given to you by your health care provider. Make sure you discuss any questions you have with your health care provider.   NO METFORMIN FOR 2 DAYS

## 2014-07-06 MED FILL — Lidocaine HCl Local Preservative Free (PF) Inj 1%: INTRAMUSCULAR | Qty: 30 | Status: AC

## 2014-07-06 MED FILL — Heparin Sodium (Porcine) 2 Unit/ML in Sodium Chloride 0.9%: INTRAMUSCULAR | Qty: 1000 | Status: AC

## 2014-07-12 DIAGNOSIS — M7751 Other enthesopathy of right foot: Secondary | ICD-10-CM | POA: Diagnosis not present

## 2014-07-12 DIAGNOSIS — M792 Neuralgia and neuritis, unspecified: Secondary | ICD-10-CM | POA: Diagnosis not present

## 2014-07-12 DIAGNOSIS — G5751 Tarsal tunnel syndrome, right lower limb: Secondary | ICD-10-CM | POA: Diagnosis not present

## 2014-07-13 DIAGNOSIS — I10 Essential (primary) hypertension: Secondary | ICD-10-CM | POA: Diagnosis not present

## 2014-07-13 DIAGNOSIS — E1151 Type 2 diabetes mellitus with diabetic peripheral angiopathy without gangrene: Secondary | ICD-10-CM | POA: Diagnosis not present

## 2014-07-13 DIAGNOSIS — I2510 Atherosclerotic heart disease of native coronary artery without angina pectoris: Secondary | ICD-10-CM | POA: Diagnosis not present

## 2014-07-13 DIAGNOSIS — I739 Peripheral vascular disease, unspecified: Secondary | ICD-10-CM | POA: Diagnosis not present

## 2014-07-21 DIAGNOSIS — I517 Cardiomegaly: Secondary | ICD-10-CM | POA: Diagnosis not present

## 2014-07-21 DIAGNOSIS — R109 Unspecified abdominal pain: Secondary | ICD-10-CM | POA: Diagnosis not present

## 2014-07-21 DIAGNOSIS — R0989 Other specified symptoms and signs involving the circulatory and respiratory systems: Secondary | ICD-10-CM | POA: Diagnosis not present

## 2014-07-21 DIAGNOSIS — N3289 Other specified disorders of bladder: Secondary | ICD-10-CM | POA: Diagnosis not present

## 2014-07-21 DIAGNOSIS — K5900 Constipation, unspecified: Secondary | ICD-10-CM | POA: Diagnosis not present

## 2014-07-21 DIAGNOSIS — R1084 Generalized abdominal pain: Secondary | ICD-10-CM | POA: Diagnosis not present

## 2014-08-08 DIAGNOSIS — I2510 Atherosclerotic heart disease of native coronary artery without angina pectoris: Secondary | ICD-10-CM | POA: Diagnosis not present

## 2014-08-08 DIAGNOSIS — I10 Essential (primary) hypertension: Secondary | ICD-10-CM | POA: Diagnosis not present

## 2014-08-08 DIAGNOSIS — E1159 Type 2 diabetes mellitus with other circulatory complications: Secondary | ICD-10-CM | POA: Diagnosis not present

## 2014-08-08 DIAGNOSIS — I6523 Occlusion and stenosis of bilateral carotid arteries: Secondary | ICD-10-CM | POA: Diagnosis not present

## 2014-08-08 DIAGNOSIS — D638 Anemia in other chronic diseases classified elsewhere: Secondary | ICD-10-CM | POA: Diagnosis not present

## 2014-08-12 DIAGNOSIS — I10 Essential (primary) hypertension: Secondary | ICD-10-CM | POA: Diagnosis not present

## 2014-08-25 DIAGNOSIS — R0602 Shortness of breath: Secondary | ICD-10-CM | POA: Diagnosis not present

## 2014-08-25 DIAGNOSIS — E780 Pure hypercholesterolemia: Secondary | ICD-10-CM | POA: Diagnosis not present

## 2014-08-25 DIAGNOSIS — R531 Weakness: Secondary | ICD-10-CM | POA: Diagnosis not present

## 2014-08-25 DIAGNOSIS — I252 Old myocardial infarction: Secondary | ICD-10-CM | POA: Diagnosis not present

## 2014-08-25 DIAGNOSIS — E119 Type 2 diabetes mellitus without complications: Secondary | ICD-10-CM | POA: Diagnosis not present

## 2014-08-25 DIAGNOSIS — I10 Essential (primary) hypertension: Secondary | ICD-10-CM | POA: Diagnosis not present

## 2014-08-25 DIAGNOSIS — Z79899 Other long term (current) drug therapy: Secondary | ICD-10-CM | POA: Diagnosis not present

## 2014-08-25 DIAGNOSIS — Z7982 Long term (current) use of aspirin: Secondary | ICD-10-CM | POA: Diagnosis not present

## 2014-09-01 DIAGNOSIS — I10 Essential (primary) hypertension: Secondary | ICD-10-CM | POA: Diagnosis not present

## 2014-09-01 DIAGNOSIS — I739 Peripheral vascular disease, unspecified: Secondary | ICD-10-CM | POA: Diagnosis not present

## 2014-09-01 DIAGNOSIS — N183 Chronic kidney disease, stage 3 (moderate): Secondary | ICD-10-CM | POA: Diagnosis not present

## 2014-09-01 DIAGNOSIS — E1122 Type 2 diabetes mellitus with diabetic chronic kidney disease: Secondary | ICD-10-CM | POA: Diagnosis not present

## 2014-09-05 DIAGNOSIS — F418 Other specified anxiety disorders: Secondary | ICD-10-CM | POA: Diagnosis not present

## 2014-09-05 DIAGNOSIS — Z79899 Other long term (current) drug therapy: Secondary | ICD-10-CM | POA: Diagnosis not present

## 2014-09-05 DIAGNOSIS — R531 Weakness: Secondary | ICD-10-CM | POA: Diagnosis not present

## 2014-09-05 DIAGNOSIS — I10 Essential (primary) hypertension: Secondary | ICD-10-CM | POA: Diagnosis not present

## 2014-09-05 DIAGNOSIS — E785 Hyperlipidemia, unspecified: Secondary | ICD-10-CM | POA: Diagnosis not present

## 2014-09-05 DIAGNOSIS — I2510 Atherosclerotic heart disease of native coronary artery without angina pectoris: Secondary | ICD-10-CM | POA: Diagnosis not present

## 2014-09-05 DIAGNOSIS — I249 Acute ischemic heart disease, unspecified: Secondary | ICD-10-CM | POA: Diagnosis not present

## 2014-09-05 DIAGNOSIS — R079 Chest pain, unspecified: Secondary | ICD-10-CM | POA: Diagnosis not present

## 2014-09-05 DIAGNOSIS — M1990 Unspecified osteoarthritis, unspecified site: Secondary | ICD-10-CM | POA: Diagnosis not present

## 2014-09-05 DIAGNOSIS — E119 Type 2 diabetes mellitus without complications: Secondary | ICD-10-CM | POA: Diagnosis not present

## 2014-09-05 DIAGNOSIS — Z8673 Personal history of transient ischemic attack (TIA), and cerebral infarction without residual deficits: Secondary | ICD-10-CM | POA: Diagnosis not present

## 2014-09-05 DIAGNOSIS — Z955 Presence of coronary angioplasty implant and graft: Secondary | ICD-10-CM | POA: Diagnosis not present

## 2014-09-06 DIAGNOSIS — R0789 Other chest pain: Secondary | ICD-10-CM | POA: Diagnosis not present

## 2014-09-06 DIAGNOSIS — M542 Cervicalgia: Secondary | ICD-10-CM | POA: Diagnosis not present

## 2014-09-08 DIAGNOSIS — R7989 Other specified abnormal findings of blood chemistry: Secondary | ICD-10-CM | POA: Diagnosis not present

## 2014-09-14 DIAGNOSIS — I10 Essential (primary) hypertension: Secondary | ICD-10-CM | POA: Diagnosis not present

## 2014-09-14 DIAGNOSIS — G4700 Insomnia, unspecified: Secondary | ICD-10-CM | POA: Diagnosis not present

## 2014-09-14 DIAGNOSIS — G894 Chronic pain syndrome: Secondary | ICD-10-CM | POA: Diagnosis not present

## 2014-09-14 DIAGNOSIS — I2510 Atherosclerotic heart disease of native coronary artery without angina pectoris: Secondary | ICD-10-CM | POA: Diagnosis not present

## 2014-09-14 DIAGNOSIS — E119 Type 2 diabetes mellitus without complications: Secondary | ICD-10-CM | POA: Diagnosis not present

## 2014-09-22 ENCOUNTER — Ambulatory Visit: Payer: Medicare Other | Admitting: Vascular Surgery

## 2014-09-27 ENCOUNTER — Emergency Department (HOSPITAL_COMMUNITY)
Admission: EM | Admit: 2014-09-27 | Discharge: 2014-09-27 | Disposition: A | Discharge status: Home / Self Care | Payer: Medicare Other | Attending: Emergency Medicine | Admitting: Emergency Medicine

## 2014-09-27 ENCOUNTER — Emergency Department (HOSPITAL_BASED_OUTPATIENT_CLINIC_OR_DEPARTMENT_OTHER): Payer: Medicare Other

## 2014-09-27 ENCOUNTER — Encounter (HOSPITAL_COMMUNITY): Payer: Self-pay | Admitting: Family Medicine

## 2014-09-27 ENCOUNTER — Ambulatory Visit: Payer: Medicare Other | Admitting: Family

## 2014-09-27 DIAGNOSIS — Z7982 Long term (current) use of aspirin: Secondary | ICD-10-CM | POA: Diagnosis not present

## 2014-09-27 DIAGNOSIS — Z79899 Other long term (current) drug therapy: Secondary | ICD-10-CM | POA: Diagnosis not present

## 2014-09-27 DIAGNOSIS — F329 Major depressive disorder, single episode, unspecified: Secondary | ICD-10-CM | POA: Diagnosis not present

## 2014-09-27 DIAGNOSIS — M542 Cervicalgia: Secondary | ICD-10-CM

## 2014-09-27 DIAGNOSIS — I25119 Atherosclerotic heart disease of native coronary artery with unspecified angina pectoris: Secondary | ICD-10-CM | POA: Diagnosis not present

## 2014-09-27 DIAGNOSIS — E119 Type 2 diabetes mellitus without complications: Secondary | ICD-10-CM | POA: Diagnosis not present

## 2014-09-27 DIAGNOSIS — Z8673 Personal history of transient ischemic attack (TIA), and cerebral infarction without residual deficits: Secondary | ICD-10-CM | POA: Insufficient documentation

## 2014-09-27 DIAGNOSIS — R079 Chest pain, unspecified: Secondary | ICD-10-CM | POA: Diagnosis not present

## 2014-09-27 DIAGNOSIS — Z87891 Personal history of nicotine dependence: Secondary | ICD-10-CM | POA: Diagnosis not present

## 2014-09-27 DIAGNOSIS — M170 Bilateral primary osteoarthritis of knee: Secondary | ICD-10-CM | POA: Diagnosis not present

## 2014-09-27 DIAGNOSIS — R52 Pain, unspecified: Secondary | ICD-10-CM | POA: Diagnosis not present

## 2014-09-27 DIAGNOSIS — F419 Anxiety disorder, unspecified: Secondary | ICD-10-CM | POA: Insufficient documentation

## 2014-09-27 DIAGNOSIS — I10 Essential (primary) hypertension: Secondary | ICD-10-CM | POA: Diagnosis not present

## 2014-09-27 DIAGNOSIS — I252 Old myocardial infarction: Secondary | ICD-10-CM | POA: Diagnosis not present

## 2014-09-27 DIAGNOSIS — Z7902 Long term (current) use of antithrombotics/antiplatelets: Secondary | ICD-10-CM | POA: Diagnosis not present

## 2014-09-27 DIAGNOSIS — K219 Gastro-esophageal reflux disease without esophagitis: Secondary | ICD-10-CM | POA: Insufficient documentation

## 2014-09-27 DIAGNOSIS — E780 Pure hypercholesterolemia: Secondary | ICD-10-CM | POA: Diagnosis not present

## 2014-09-27 DIAGNOSIS — Z9861 Coronary angioplasty status: Secondary | ICD-10-CM | POA: Insufficient documentation

## 2014-09-27 DIAGNOSIS — R072 Precordial pain: Secondary | ICD-10-CM | POA: Diagnosis not present

## 2014-09-27 LAB — BASIC METABOLIC PANEL
ANION GAP: 8 (ref 5–15)
BUN: 17 mg/dL (ref 6–20)
CHLORIDE: 106 mmol/L (ref 101–111)
CO2: 26 mmol/L (ref 22–32)
Calcium: 9.7 mg/dL (ref 8.9–10.3)
Creatinine, Ser: 1.51 mg/dL — ABNORMAL HIGH (ref 0.44–1.00)
GFR calc Af Amer: 41 mL/min — ABNORMAL LOW (ref >60)
GFR calc non Af Amer: 36 mL/min — ABNORMAL LOW (ref >60)
GLUCOSE: 108 mg/dL — AB (ref 65–99)
POTASSIUM: 4.5 mmol/L (ref 3.5–5.1)
Sodium: 140 mmol/L (ref 135–145)

## 2014-09-27 LAB — CBC
HEMATOCRIT: 26.2 % — AB (ref 36.0–46.0)
HEMOGLOBIN: 8.6 g/dL — AB (ref 12.0–15.0)
MCH: 25.8 pg — AB (ref 26.0–34.0)
MCHC: 32.8 g/dL (ref 30.0–36.0)
MCV: 78.7 fL (ref 78.0–100.0)
Platelets: 240 10*3/uL (ref 150–400)
RBC: 3.33 MIL/uL — ABNORMAL LOW (ref 3.87–5.11)
RDW: 16 % — ABNORMAL HIGH (ref 11.5–15.5)
WBC: 5.4 10*3/uL (ref 4.0–10.5)

## 2014-09-27 LAB — TROPONIN I: Troponin I: <0.03 ng/mL (ref <0.031)

## 2014-09-27 LAB — I-STAT TROPONIN, ED: Troponin i, poc: 0 ng/mL (ref 0.00–0.08)

## 2014-09-27 MED ORDER — OXYCODONE-ACETAMINOPHEN 5-325 MG PO TABS
1.0000 | ORAL_TABLET | Freq: Once | ORAL | Status: AC
  Administered 2014-09-27: 1 via ORAL
  Filled 2014-09-27: qty 1

## 2014-09-27 NOTE — ED Notes (Signed)
Patient transported to Ultrasound 

## 2014-09-27 NOTE — ED Provider Notes (Signed)
CSN: [id number]     Arrival date & time 09/27/14  [id number] History   First MD Initiated Contact with Patient 09/27/14 [phone]     Chief Complaint  Patient presents with  . Chest Pain  . Neck Pain   HPI   64 year old female presents today with complaints of neck pain and chest pain. She is a 64 year old African-American female with a history of hypertension, hyperlipidemia, diabetes, coronary artery disease and history of bilateral carotid endarterectomy by Dr. [name] in December 2015 on the left, and March 2016 on the right. She also has a history of angioplasty and stent placement approximately 7 years ago in [facility]. Patient has also had peripheral arteriogram on 10/05/2013.   Upon my initial evaluation the first thing patient noted was that she needs [place] to fax information into the Court as she had a court hearing today at 16 and would not be going. She was adamant that we do this. Patient is a very poor historian with her story and timeline changing dramatically throughout my evaluation. Patient's first is complaint was about her neck, reporting that since March 2016 she's had right neck pain following her procedure. She reports it is a soreness that is made worse with head movements or any physical activity. When asked about shortness of breath, dizziness, headache patient reports that she has these intermittently, they're not associated with the neck pain. Patient reports this is been present since the surgery, but reports that when she saw the surgeon who performed the operation she was not currently having neck pain and did not mention it. She reports that over the last several weeks it has been more consistent, and reports that she's had "dizzy spells". Patient denies any swelling or signs of infection to the neck. Patient secondly notes that she's had intermittent chest "soreness" for the past several months, she changed this to the last several weeks, and now is reporting that  it's only been there for the last several days. She describes it as soreness, with no radiation, made worse by palpation, not made worse by deep inspiration. This chest pain is not associated with nausea, vomiting, dizziness, shortness of breath. She denies any lower extremity swelling or edema, recent surgery, malignancy, estrogen use, history of DVT or PE. Patient denies fever, chills, cough, upper respiratory symptoms, nausea, vomiting, abdominal pain, lower extremity swelling or edema.    Past Medical History  Diagnosis Date  . Hypertension   . Coronary artery disease   . Angina   . Depression   . Claudication   . PAD (peripheral artery disease)   . High cholesterol   . Myocardial infarction 1990's    "1"  . Type II diabetes mellitus   . GERD (gastroesophageal reflux disease)   . Stroke     "mini stroke 1st then regular stroke", denies residual on 07/20/2013  . Arthritis     "knees" (07/20/2013)  . Anxiety   . Carotid stenosis   . Carotid stenosis   . Wears glasses    Past Surgical History  Procedure Laterality Date  . Abdominal hysterectomy    . Tubal ligation    . Esophagogastroduodenoscopy N/A 10/13/2012    Procedure: ESOPHAGOGASTRODUODENOSCOPY (EGD);  Surgeon: Gatha Mayer, MD;  Location: [facility] ENDOSCOPY;  Service: Endoscopy;  Laterality: N/A;  . Lower extremity angiogram  07/20/2013    Unsuccessful attempt at crossing the CTO/notes 07/20/2013  . Coronary angioplasty with stent placement      "1"  .  Balloon angioplasty, artery  10/05/2013    DR [name]  . Atherectomy  10/05/2013  . Lower extremity angiogram N/A 07/06/2013    Procedure: LOWER EXTREMITY ANGIOGRAM;  Surgeon: Laverda Page, MD;  Location: [facility] CATH LAB;  Service: Cardiovascular;  Laterality: N/A;  . Lower extremity angiogram N/A 07/20/2013    Procedure: LOWER EXTREMITY ANGIOGRAM;  Surgeon: Laverda Page, MD;  Location: [facility] CATH LAB;  Service: Cardiovascular;  Laterality: N/A;  . Lower extremity angiogram N/A  10/05/2013    Procedure: LOWER EXTREMITY ANGIOGRAM;  Surgeon: Laverda Page, MD;  Location: [facility] CATH LAB;  Service: Cardiovascular;  Laterality: N/A;  . Endarterectomy Left 02/07/2014    Procedure: ENDARTERECTOMY CAROTID;  Surgeon: Elam Dutch, MD;  Location: [facility];  Service: Vascular;  Laterality: Left;  . Patch angioplasty Left 02/07/2014    Procedure: PATCH ANGIOPLASTY Carotid;  Surgeon: Elam Dutch, MD;  Location: [facility] OR;  Service: Vascular;  Laterality: Left;  . Carotid endarterectomy    . Endarterectomy Right 04/18/2014    Procedure: ENDARTERECTOMY RIGHT CAROTID;  Surgeon: Elam Dutch, MD;  Location: [facility] OR;  Service: Vascular;  Laterality: Right;  . Patch angioplasty Right 04/18/2014    Procedure: PATCH ANGIOPLASTY USING HEMASHIELD 0.8cmx 7.6cm PATCH;  Surgeon: Elam Dutch, MD;  Location: [facility] OR;  Service: Vascular;  Laterality: Right;  . Endarterectomy N/A 04/24/2014    Procedure: IRRIGATION AND DEBRIDEMENT OF RIGHT NECK ;  Surgeon: Elam Dutch, MD;  Location: [place];  Service: Vascular;  Laterality: N/A;  . Peripheral vascular catheterization N/A 07/05/2014    Procedure: Lower Extremity Angiography;  Surgeon: [name], MD;  Location: [place] CV LAB;  Service: Cardiovascular;  Laterality: N/A;   No family history on file. Social History  Substance Use Topics  . Smoking status: Former Smoker -- 0.50 packs/day for 4 years    Types: Cigarettes    Quit date: 05/30/2009  . Smokeless tobacco: Never Used  . Alcohol Use: No   OB History    No data available     Review of Systems  All other systems reviewed and are negative.   Allergies  Review of patient's allergies indicates no known allergies.  Home Medications   Prior to Admission medications   Medication Sig Start Date End Date Taking? Authorizing Provider  ALPRAZolam [name]) 0.25 MG tablet Take 0.25 mg by mouth daily as needed for anxiety.   Yes Historical Provider, MD  amLODipine (NORVASC) 10 MG  tablet Take 10 mg by mouth daily. 09/22/14  Yes Historical Provider, MD  aspirin EC 81 MG tablet Take 81 mg by mouth daily.   Yes Historical Provider, MD  cilostazol (PLETAL) 100 MG tablet Take 100 mg by mouth 2 (two) times daily.  05/25/13  Yes Historical Provider, MD  clopidogrel (PLAVIX) 75 MG tablet Take 1 tablet (75 mg total) by mouth daily. 10/06/13  Yes [name], MD  hydrALAZINE (APRESOLINE) 50 MG tablet Take 50 mg by mouth 3 (three) times daily. 09/22/14  Yes Historical Provider, MD  HYDROcodone-acetaminophen (NORCO/VICODIN) 5-325 MG per tablet Take 1 tablet by mouth every 6 (six) hours as needed for moderate pain. 04/19/14  Yes Kimberly A Trinh, PA-C  Iron-FA-B Cmp-C-Biot-Probiotic (FUSION PLUS) CAPS Take 1 capsule by mouth daily.   Yes Historical Provider, MD  meloxicam (MOBIC) 7.5 MG tablet Take 7.5 mg by mouth daily as needed for pain.  05/14/13  Yes Historical Provider, MD  metFORMIN (GLUCOPHAGE) 500 MG tablet Take 1 tablet (  500 mg total) by mouth daily with breakfast. 07/05/14  Yes [name], MD  metoprolol (LOPRESSOR) 50 MG tablet Take 50 mg by mouth 2 (two) times daily.   Yes Historical Provider, MD  niacin (NIASPAN) 1000 MG CR tablet Take 1,000 mg by mouth daily.  12/21/13  Yes Historical Provider, MD  nitroGLYCERIN (NITROSTAT) 0.4 MG SL tablet Place 0.4 mg under the tongue every 5 (five) minutes as needed for chest pain.    Yes Historical Provider, MD  omeprazole (PRILOSEC) 20 MG capsule Take 20 mg by mouth daily.   Yes Historical Provider, MD  pravastatin (PRAVACHOL) 40 MG tablet Take 40 mg by mouth daily.   Yes Historical Provider, MD  tiZANidine (ZANAFLEX) 4 MG tablet Take 4 mg by mouth at bedtime as needed for muscle spasms.  03/11/14  Yes Historical Provider, MD  valsartan-hydrochlorothiazide (DIOVAN-HCT) 320-25 MG per tablet Take 1 tablet by mouth every morning. 06/24/14  Yes Historical Provider, MD  zolpidem (AMBIEN) 10 MG tablet Take 10 mg by mouth at bedtime as needed for sleep.   06/17/14  Yes Historical Provider, MD   BP 115/48 mmHg  Pulse 79  Temp(Src) 97.4 F (36.3 C) (Oral)  Resp 16  Ht 4\' 11"  (1.499 m)  Wt 118 lb (53.524 kg)  BMI 23.82 kg/m2  SpO2 97% Physical Exam  Constitutional: She is oriented to person, place, and time. She appears well-developed and well-nourished.  HENT:  Head: Normocephalic and atraumatic.  Eyes: Conjunctivae are normal. Pupils are equal, round, and reactive to light. Right eye exhibits no discharge. Left eye exhibits no discharge. No scleral icterus.  Neck: Normal range of motion. No JVD present. No tracheal deviation present.  Well-healed surgical scar to the right and left neck, no signs of infection, right-sided tender to palpation. No carotid bruits noted. Neck is supple with full active range of motion.  Cardiovascular: Normal rate, regular rhythm, normal heart sounds and intact distal pulses.  Exam reveals no gallop and no friction rub.   No murmur heard. Pulmonary/Chest: Effort normal. No stridor. No respiratory distress. She has no wheezes. She has no rales. She exhibits no tenderness.  Patient minimally tender to palpation of the sternum.  Musculoskeletal:  No lower extremity swelling or edema.  Neurological: She is alert and oriented to person, place, and time. Coordination normal.  Skin: Skin is warm and dry.  Psychiatric: She has a normal mood and affect. Her behavior is normal. Judgment and thought content normal.  Nursing note and vitals reviewed.   ED Course  Procedures (including critical care time) Labs Review Labs Reviewed  CBC WITH DIFFERENTIAL/PLATELET  Randolm Idol, ED    Imaging Review No results found. I, Rylinn Linzy Todd, personally reviewed and evaluated these images and lab results as part of my medical decision-making.   EKG Interpretation None      MDM   Final diagnoses:  Pain  Neck pain    Labs: I-STAT troponin, CBC, BMP  Imaging: DG chest  Consults:  Therapeutics:  Prior to arrival patient received aspirin  Discharge Meds:   Assessment/Plan: Due to delays in ordering tests patient spent significant time here in the emergency room. Throughout that time in the emergency room she was asymptomatic of her chest pain, she had a normal troponin, and no significant findings on EKG. Patient was tender to palpation of the anterior chest wall, this is highly unlikely to be ACS. Due to patient's significant vascular history and pain about the side of her previous endarterectomy  vascular studies were ordered for further evaluation. Patient remained stable throughout her stay here in the ED, at the time shift change she was signed out to the following provider with instructions to follow-up on vascular study. If no significant findings were noted, patient may be safely discharged home with instructions to follow-up with her primary care provider and surgeon who performed the operation. She is instructed to do this within the next 2 days. No signs of infection, no carotid bruits noted.  After discussing the results with patient and patient informed me that she would need Percocet for the pain for home. She reports that she had been seen several weeks ago in the [facility] emergency room for this complaint, noting that they gave her Percocet and that helped with the pain. This was new information to me, patient denied having seen anyone in the emergency room for this previously. She reports that she was instructed to follow up with her vascular surgeon for this, but reports that he was not able to see her until September 8. I informed the patient that I would not be giving her narcotic pain medication for this ongoing complaint and that she needs to follow-up immediately with her vascular surgeon for further evaluation and management. I see no signs or findings on laboratory or diagnostic imaging today that would necessitate emergent consult to vascular surgeon or hospital management.  Patient is instructed use ibuprofen or Tylenol as needed for pain, monitor for new or worsening signs or symptoms, call vascular surgeon first thing tomorrow morning and schedule follow-up evaluation. She's also encouraged to follow up with her primary care provider inform them of the visit as well.         Okey Regal, PA-C 09/27/14 2110  Deno Etienne, DO 09/28/14 1249

## 2014-09-27 NOTE — Progress Notes (Signed)
VASCULAR LAB PRELIMINARY  PRELIMINARY  PRELIMINARY  PRELIMINARY  Carotid duplex completed.    Preliminary report:  Right : 1% to 39% ICA stenosis. Left :  40-59% internal carotid artery stenosis.  Bilateral ECA stenosis. Bilateral - Vertebral artery flow is antegrade.  [name], RVS 09/27/2014, 7:08 PM

## 2014-09-27 NOTE — ED Notes (Signed)
Pt presents from home with c/o chest pain x3 days and neck pain 3 months.  Pt reports had surgery in December to "clear some blockages in my neck".

## 2014-09-27 NOTE — Discharge Instructions (Signed)
Please contact her vascular surgeon tomorrow and inform them of your visit today and all relevant data. Please follow-up with them immediately for further evaluation and management. Please contact her primary care provider inform them of your visit as well. If new or worsening symptoms present follow-up immediately.

## 2014-10-03 ENCOUNTER — Encounter: Payer: Self-pay | Admitting: Family

## 2014-10-04 ENCOUNTER — Encounter (HOSPITAL_COMMUNITY): Payer: Medicare Other

## 2014-10-04 ENCOUNTER — Encounter: Payer: Self-pay | Admitting: Family

## 2014-10-04 ENCOUNTER — Ambulatory Visit (INDEPENDENT_AMBULATORY_CARE_PROVIDER_SITE_OTHER): Payer: Medicare Other | Admitting: Family

## 2014-10-04 VITALS — BP 164/68 | HR 90 | Temp 97.2°F | Resp 18 | Ht 59.0 in | Wt 120.0 lb

## 2014-10-04 DIAGNOSIS — Z9889 Other specified postprocedural states: Secondary | ICD-10-CM | POA: Diagnosis not present

## 2014-10-04 DIAGNOSIS — M542 Cervicalgia: Secondary | ICD-10-CM | POA: Diagnosis not present

## 2014-10-04 HISTORY — DX: Cervicalgia: M54.2

## 2014-10-04 NOTE — Progress Notes (Signed)
Filed Vitals:   10/04/14 0931 10/04/14 0937 10/04/14 0943 10/04/14 0945  BP: 169/70 161/72 164/72 164/68  Pulse: 90 89 93 90  Temp:  97.2 F (36.2 C)    TempSrc:  Oral    Resp:  18    Height:  4\' 11"  (1.499 m)    Weight:  120 lb (54.432 kg)    SpO2:  100%

## 2014-10-04 NOTE — Progress Notes (Signed)
Established CEA   History of Present Illness  Joanne Morris is a 64 y.o. female patient of Dr. [name] who is s/p left carotid endarterectomy on 02/07/14. She subsequently underwent right carotid endarterectomy on April 18, 2014. This was complicated by a postop seroma which was evacuated. Cultures were taken of this which were negative. She was placed on Keflex for 2 weeks. She has completed her course of antibiotics. Pt last saw Dr. [name] on 05/11/14. At that time she was doing well status post bilateral carotid endarterectomies and was to follow-up with carotid duplex scan in 6 months. She was hypertensive in the office that day and was to follow up with Dr. [name] for medication titration.   She was seen at [facility] ED on 09/27/14 for chest pain. She returns today on advice of ED provider and for intermittent right side of neck and back of neck pain since May 2016; bending over or sweeping with a broom worsens this pain. She was told that testing from [facility] ED did not show any reason for the pain.  She denies swelling in her neck, denies dysphagia.  Pt states her temperature last Monday was close to 100 when taken by home health nurse who visits once/month per pt, states an aid visits her daily; is having chills also but she states she has chills from taking a blood thinner. Noted that WBC on 09/27/14 ED visit was normal.   ED provider note from 09/27/14: [ Due to delays in ordering tests patient spent significant time here in the emergency room. Throughout that time in the emergency room she was asymptomatic of her chest pain, she had a normal troponin, and no significant findings on EKG. Patient was tender to palpation of the anterior chest wall, this is highly unlikely to be ACS. Due to patient's significant vascular history and pain about the side of her previous endarterectomy vascular studies were ordered for further evaluation. Patient remained stable throughout her stay here in the ED, at the  time shift change she was signed out to the following provider with instructions to follow-up on vascular study. If no significant findings were noted, patient may be safely discharged home with instructions to follow-up with her primary care provider and surgeon who performed the operation. She is instructed to do this within the next 2 days. No signs of infection, no carotid bruits noted. After discussing the results with patient and patient informed me that she would need Percocet for the pain for home. She reports that she had been seen several weeks ago in the [facility] emergency room for this complaint, noting that they gave her Percocet and that helped with the pain. This was new information to me, patient denied having seen anyone in the emergency room for this previously. She reports that she was instructed to follow up with her vascular surgeon for this, but reports that he was not able to see her until September 8. I informed the patient that I would not be giving her narcotic pain medication for this ongoing complaint and that she needs to follow-up immediately with her vascular surgeon for further evaluation and management. I see no signs or findings on laboratory or diagnostic imaging today that would necessitate emergent consult to vascular surgeon or hospital management. Patient is instructed use ibuprofen or Tylenol as needed for pain, monitor for new or worsening signs or symptoms, call vascular surgeon first thing tomorrow morning and schedule follow-up evaluation. She's also encouraged to follow up with her  primary care provider inform them of the visit as well.]  She uses a cane to help with ambulation as she states she is a little unsteady, this is not worsening however.  She had a stroke in her late 5's or 74's ,states she was hypertensive, states her symptoms were feeling generally weak and dizzy, but states she has trouble remembering what her symptoms were. She denies any residual  hemiparesis, denies any speech difficulties, denies any monocular visual problems   The patient's neck incision is well healed.  The patient has had no stroke or TIA symptoms since the bilateral CEA's.   Past Medical History  Diagnosis Date  . Hypertension   . Coronary artery disease   . Angina   . Depression   . Claudication   . PAD (peripheral artery disease)   . High cholesterol   . Myocardial infarction 1990's    "1"  . Type II diabetes mellitus   . GERD (gastroesophageal reflux disease)   . Stroke     "mini stroke 1st then regular stroke", denies residual on 07/20/2013  . Arthritis     "knees" (07/20/2013)  . Anxiety   . Carotid stenosis   . Carotid stenosis   . Wears glasses    Past Medical History  Diagnosis Date  . Hypertension   . Coronary artery disease   . Angina   . Depression   . Claudication   . PAD (peripheral artery disease)   . High cholesterol   . Myocardial infarction 1990's    "1"  . Type II diabetes mellitus   . GERD (gastroesophageal reflux disease)   . Stroke     "mini stroke 1st then regular stroke", denies residual on 07/20/2013  . Arthritis     "knees" (07/20/2013)  . Anxiety   . Carotid stenosis   . Carotid stenosis   . Wears glasses    Past Surgical History  Procedure Laterality Date  . Abdominal hysterectomy    . Tubal ligation    . Esophagogastroduodenoscopy N/A 10/13/2012    Procedure: ESOPHAGOGASTRODUODENOSCOPY (EGD);  Surgeon: Gatha Mayer, MD;  Location: [facility] ENDOSCOPY;  Service: Endoscopy;  Laterality: N/A;  . Lower extremity angiogram  07/20/2013    Unsuccessful attempt at crossing the CTO/notes 07/20/2013  . Coronary angioplasty with stent placement      "1"  . Balloon angioplasty, artery  10/05/2013    DR [name]  . Atherectomy  10/05/2013  . Lower extremity angiogram N/A 07/06/2013    Procedure: LOWER EXTREMITY ANGIOGRAM;  Surgeon: Laverda Page, MD;  Location: [facility] CATH LAB;  Service: Cardiovascular;  Laterality: N/A;  . Lower  extremity angiogram N/A 07/20/2013    Procedure: LOWER EXTREMITY ANGIOGRAM;  Surgeon: Laverda Page, MD;  Location: [facility] CATH LAB;  Service: Cardiovascular;  Laterality: N/A;  . Lower extremity angiogram N/A 10/05/2013    Procedure: LOWER EXTREMITY ANGIOGRAM;  Surgeon: Laverda Page, MD;  Location: [facility] CATH LAB;  Service: Cardiovascular;  Laterality: N/A;  . Endarterectomy Left 02/07/2014    Procedure: ENDARTERECTOMY CAROTID;  Surgeon: Elam Dutch, MD;  Location: [place];  Service: Vascular;  Laterality: Left;  . Patch angioplasty Left 02/07/2014    Procedure: PATCH ANGIOPLASTY Carotid;  Surgeon: Elam Dutch, MD;  Location: [facility] OR;  Service: Vascular;  Laterality: Left;  . Carotid endarterectomy    . Endarterectomy Right 04/18/2014    Procedure: ENDARTERECTOMY RIGHT CAROTID;  Surgeon: Elam Dutch, MD;  Location: [place];  Service: Vascular;  Laterality: Right;  . Patch angioplasty Right 04/18/2014    Procedure: PATCH ANGIOPLASTY USING HEMASHIELD 0.8cmx 7.6cm PATCH;  Surgeon: Elam Dutch, MD;  Location: [facility] OR;  Service: Vascular;  Laterality: Right;  . Endarterectomy N/A 04/24/2014    Procedure: IRRIGATION AND DEBRIDEMENT OF RIGHT NECK ;  Surgeon: Elam Dutch, MD;  Location: [place];  Service: Vascular;  Laterality: N/A;  . Peripheral vascular catheterization N/A 07/05/2014    Procedure: Lower Extremity Angiography;  Surgeon: [name], MD;  Location: [place] CV LAB;  Service: Cardiovascular;  Laterality: N/A;    History reviewed. No pertinent family history.  No Known Allergies   Social History   Social History  . Marital Status: Legally Separated    Spouse Name: N/A  . Number of Children: N/A  . Years of Education: N/A   Occupational History  . Not on file.   Social History Main Topics  . Smoking status: Former Smoker -- 0.50 packs/day for 4 years    Types: Cigarettes    Quit date: 05/30/2009  . Smokeless tobacco: Never Used  . Alcohol Use: No  . Drug Use:  No  . Sexual Activity: Yes    Birth Control/ Protection: Post-menopausal   Other Topics Concern  . Not on file   Social History Narrative    Current Outpatient Prescriptions on File Prior to Visit  Medication Sig Dispense Refill  . ALPRAZolam (XANAX) 0.25 MG tablet Take 0.25 mg by mouth daily as needed for anxiety.    Marland Kitchen amLODipine (NORVASC) 10 MG tablet Take 10 mg by mouth daily.  5  . aspirin EC 81 MG tablet Take 81 mg by mouth daily.    . cilostazol (PLETAL) 100 MG tablet Take 100 mg by mouth 2 (two) times daily.     . clopidogrel (PLAVIX) 75 MG tablet Take 1 tablet (75 mg total) by mouth daily. 30 tablet 3  . hydrALAZINE (APRESOLINE) 50 MG tablet Take 50 mg by mouth 3 (three) times daily.  0  . HYDROcodone-acetaminophen (NORCO/VICODIN) 5-325 MG per tablet Take 1 tablet by mouth every 6 (six) hours as needed for moderate pain. 30 tablet 0  . Iron-FA-B Cmp-C-Biot-Probiotic (FUSION PLUS) CAPS Take 1 capsule by mouth daily.    . meloxicam (MOBIC) 7.5 MG tablet Take 7.5 mg by mouth daily as needed for pain.     . metFORMIN (GLUCOPHAGE) 500 MG tablet Take 1 tablet (500 mg total) by mouth daily with breakfast.    . metoprolol (LOPRESSOR) 50 MG tablet Take 50 mg by mouth 2 (two) times daily.    . niacin (NIASPAN) 1000 MG CR tablet Take 1,000 mg by mouth daily.   6  . nitroGLYCERIN (NITROSTAT) 0.4 MG SL tablet Place 0.4 mg under the tongue every 5 (five) minutes as needed for chest pain.     Marland Kitchen omeprazole (PRILOSEC) 20 MG capsule Take 20 mg by mouth daily.    . pravastatin (PRAVACHOL) 40 MG tablet Take 40 mg by mouth daily.    Marland Kitchen tiZANidine (ZANAFLEX) 4 MG tablet Take 4 mg by mouth at bedtime as needed for muscle spasms.   2  . valsartan-hydrochlorothiazide (DIOVAN-HCT) 320-25 MG per tablet Take 1 tablet by mouth every morning.  2  . zolpidem (AMBIEN) 10 MG tablet Take 10 mg by mouth at bedtime as needed for sleep.   2   No current facility-administered medications on file prior to visit.     Review of Systems : See HPI for pertinent  positives and negatives.   Physical Examination  Filed Vitals:   10/04/14 0931 10/04/14 0937 10/04/14 0943 10/04/14 0945  BP: 169/70 161/72 164/72 164/68  Pulse: 90 89 93 90  Temp:  97.2 F (36.2 C)    TempSrc:  Oral    Resp:  18    Height:  4\' 11"  (1.499 m)    Weight:  120 lb (54.432 kg)    SpO2:  100%     Body mass index is 24.22 kg/(m^2).  General: WDWN female in NAD GAIT: slow and deliberate, using cane Eyes: PERRLA Pulmonary:  Non-labored, CTAB, no rales, no rhonchi, & no wheezing.  Cardiac: regular rhythm,  no detected murmur.  VASCULAR EXAM Carotid Bruits Right Left   Negative Negative    Aorta is not palpable. Radial pulses are 2+ palpable and equal.                                                                                                                            LE Pulses Right Left       POPLITEAL  not palpable   not palpable       POSTERIOR TIBIAL  not palpable   not palpable        DORSALIS PEDIS      ANTERIOR TIBIAL faintly palpable  faintly palpable     Gastrointestinal: soft, nontender, BS WNL, no r/g,  no palpable masses.  Musculoskeletal: generalized mild muscle atrophy/wasting. M/S 3/5 throughout, extremities without ischemic changes.  Neurologic: A&O X 3; Appropriate Affect, Speech is normal CN 2-12 intact except left facial droop with smile, pain and light touch intact in extremities, Motor exam as listed above.    Bilateral neck: incision are well healed. No swelling or erythema at well healed bilateral carotid endarterectomy sites, five and eight months after the surgeries.   Medical Decision Making  Yunuen Morefield is a 64 y.o. female who presents s/p left carotid endarterectomy on 02/07/14. She subsequently underwent right carotid endarterectomy on April 18, 2014. Marland Kitchen Her right side of neck and posterior neck pain that is exacerbated by bending over and sweeping  Discussed pt HPI, ED  visit a week ago, and exam results with Dr. Kellie Simmering. It is unlikely that her right side of neck pain and posterior neck pain would be related to her carotid endarterectomies that were five and eight months ago. Pt cannot stay for next available opening for non invasive vascular lab to perform carotid US, will therefore keep scheduled appointment for carotid Duplex and see Dr. [name] in about 6 weeks, October 2016. Pt knows to return sooner if she develops swelling on the right side of her neck or dysphagia.   The patient's neck incision is healed with no stroke symptoms. I discussed in depth with the patient the nature of atherosclerosis, and emphasized the importance of maximal medical management including strict control of blood pressure, blood glucose, and lipid levels, obtaining regular exercise, anti-platelet use and cessation  of smoking.   The patient is currently on an antiplatelet: ASA and Plavix. The patient is currently on a statin: pravastatin 40 mg.   The patient is aware that without maximal medical management the underlying atherosclerotic disease process will progress, limiting the benefit of any interventions. The patient's surveillance will included routine carotid duplex studies which will be completed in: on 11/24/14 as already scheduled with Dr. [name], at which time the patient will be re-evaluated.   I emphasized the importance of routine surveillance of the carotid arteries as recurrence of stenosis is possible, especially with proper management of underlying atherosclerotic disease. The patient agrees to participate in their maximal medical care and routine surveillance.  Thank you for allowing [place] to participate in this patient's care.  NICKEL, Sharmon Leyden, RN, MSN, FNP-C [facility] Office: [phone]  10/04/2014, 9:57 AM  Clinic MD: Kellie Simmering

## 2014-10-13 DIAGNOSIS — I6789 Other cerebrovascular disease: Secondary | ICD-10-CM | POA: Diagnosis not present

## 2014-10-13 DIAGNOSIS — E784 Other hyperlipidemia: Secondary | ICD-10-CM | POA: Diagnosis not present

## 2014-10-13 DIAGNOSIS — I10 Essential (primary) hypertension: Secondary | ICD-10-CM | POA: Diagnosis not present

## 2014-10-13 DIAGNOSIS — E1159 Type 2 diabetes mellitus with other circulatory complications: Secondary | ICD-10-CM | POA: Diagnosis not present

## 2014-10-13 DIAGNOSIS — I6523 Occlusion and stenosis of bilateral carotid arteries: Secondary | ICD-10-CM | POA: Diagnosis not present

## 2014-10-20 ENCOUNTER — Ambulatory Visit: Payer: Medicare Other | Admitting: Family

## 2014-11-22 ENCOUNTER — Encounter: Payer: Self-pay | Admitting: Vascular Surgery

## 2014-11-24 ENCOUNTER — Ambulatory Visit (INDEPENDENT_AMBULATORY_CARE_PROVIDER_SITE_OTHER): Payer: Medicare Other | Admitting: Family

## 2014-11-24 ENCOUNTER — Encounter: Payer: Self-pay | Admitting: Family

## 2014-11-24 ENCOUNTER — Ambulatory Visit (HOSPITAL_COMMUNITY)
Admission: RE | Admit: 2014-11-24 | Discharge: 2014-11-24 | Disposition: A | Discharge status: Home / Self Care | Payer: Medicare Other | Source: Ambulatory Visit | Attending: Vascular Surgery | Admitting: Vascular Surgery

## 2014-11-24 ENCOUNTER — Encounter: Payer: Self-pay | Admitting: Vascular Surgery

## 2014-11-24 VITALS — BP 166/78 | HR 84 | Temp 97.6°F | Resp 20 | Ht 61.5 in | Wt 121.0 lb

## 2014-11-24 DIAGNOSIS — E119 Type 2 diabetes mellitus without complications: Secondary | ICD-10-CM | POA: Insufficient documentation

## 2014-11-24 DIAGNOSIS — Z87891 Personal history of nicotine dependence: Secondary | ICD-10-CM

## 2014-11-24 DIAGNOSIS — Z9889 Other specified postprocedural states: Secondary | ICD-10-CM | POA: Diagnosis not present

## 2014-11-24 DIAGNOSIS — Z01810 Encounter for preprocedural cardiovascular examination: Secondary | ICD-10-CM | POA: Insufficient documentation

## 2014-11-24 DIAGNOSIS — Z48812 Encounter for surgical aftercare following surgery on the circulatory system: Secondary | ICD-10-CM

## 2014-11-24 DIAGNOSIS — I10 Essential (primary) hypertension: Secondary | ICD-10-CM | POA: Insufficient documentation

## 2014-11-24 DIAGNOSIS — I6529 Occlusion and stenosis of unspecified carotid artery: Secondary | ICD-10-CM | POA: Insufficient documentation

## 2014-11-24 DIAGNOSIS — I6522 Occlusion and stenosis of left carotid artery: Secondary | ICD-10-CM | POA: Insufficient documentation

## 2014-11-24 NOTE — Progress Notes (Signed)
Established Carotid Patient   History of Present Illness  Joanne Morris is a 64 y.o. female patient of Dr. [name] who is s/p left carotid endarterectomy on 02/07/14. She subsequently underwent right carotid endarterectomy on April 18, 2014. This was complicated by a postop seroma which was evacuated. Cultures were taken of this which were negative. She was placed on Keflex for 2 weeks. She has completed her course of antibiotics. Pt last saw Dr. [name] on 05/11/14. At that time she was doing well status post bilateral carotid endarterectomies and was to follow-up with carotid duplex scan in 6 months. She was hypertensive in the office that day and was to follow up with Dr. [name] for medication titration.   She uses a cane to help with ambulation as she states she is a little unsteady, this is not worsening however.  She had a stroke in her late 12's or 8's, states she was hypertensive, states her symptoms were feeling generally weak and dizzy, but states she has trouble remembering what her symptoms were. She denies any residual hemiparesis, denies any speech difficulties, denies any monocular visual problems. She denies any subsequent strokes or TIA's.  The patient's neck incision is well healed. Pt reports that she still has some right CEA peri-incisional discomfort, but that this is improving.    Review of records: Dr. [name] performed LE arteriogram in May 2016, unable to find documentation whether intervention was performed, pt does not recall if intervention was performed.    Pt Diabetic: yes, states in good control Pt smoker: former smoker, quit December 2015, rare smoking prior to [place], took 2-3 weeks to smoke a pack, started in her late 49's  Pt meds include: Statin : yes ASA: yes Other anticoagulants/antiplatelets: clopidogrel, cilostazol   Past Medical History  Diagnosis Date  . Hypertension   . Coronary artery disease   . Angina   . Depression   . Claudication ([place])   .  PAD (peripheral artery disease) ([place])   . High cholesterol   . Myocardial infarction ([place]) 1990's    "1"  . Type II diabetes mellitus ([place])   . GERD (gastroesophageal reflux disease)   . Stroke [facility])     "mini stroke 1st then regular stroke", denies residual on 07/20/2013  . Arthritis     "knees" (07/20/2013)  . Anxiety   . Carotid stenosis   . Carotid stenosis   . Wears glasses     Social History Social History  Substance Use Topics  . Smoking status: Former Smoker -- 0.50 packs/day for 4 years    Types: Cigarettes    Quit date: 05/30/2009  . Smokeless tobacco: Never Used  . Alcohol Use: No    Family History No family history on file.  Surgical History Past Surgical History  Procedure Laterality Date  . Abdominal hysterectomy    . Tubal ligation    . Esophagogastroduodenoscopy N/A 10/13/2012    Procedure: ESOPHAGOGASTRODUODENOSCOPY (EGD);  Surgeon: Gatha Mayer, MD;  Location: [facility] ENDOSCOPY;  Service: Endoscopy;  Laterality: N/A;  . Lower extremity angiogram  07/20/2013    Unsuccessful attempt at crossing the CTO/notes 07/20/2013  . Coronary angioplasty with stent placement      "1"  . Balloon angioplasty, artery  10/05/2013    DR [name]  . Atherectomy  10/05/2013  . Lower extremity angiogram N/A 07/06/2013    Procedure: LOWER EXTREMITY ANGIOGRAM;  Surgeon: Laverda Page, MD;  Location: [facility] CATH LAB;  Service: Cardiovascular;  Laterality: N/A;  .  Lower extremity angiogram N/A 07/20/2013    Procedure: LOWER EXTREMITY ANGIOGRAM;  Surgeon: Laverda Page, MD;  Location: [facility] CATH LAB;  Service: Cardiovascular;  Laterality: N/A;  . Lower extremity angiogram N/A 10/05/2013    Procedure: LOWER EXTREMITY ANGIOGRAM;  Surgeon: Laverda Page, MD;  Location: [facility] CATH LAB;  Service: Cardiovascular;  Laterality: N/A;  . Endarterectomy Left 02/07/2014    Procedure: ENDARTERECTOMY CAROTID;  Surgeon: Elam Dutch, MD;  Location: [place];  Service: Vascular;  Laterality: Left;  . Patch  angioplasty Left 02/07/2014    Procedure: PATCH ANGIOPLASTY Carotid;  Surgeon: Elam Dutch, MD;  Location: [facility] OR;  Service: Vascular;  Laterality: Left;  . Carotid endarterectomy    . Endarterectomy Right 04/18/2014    Procedure: ENDARTERECTOMY RIGHT CAROTID;  Surgeon: Elam Dutch, MD;  Location: [facility] OR;  Service: Vascular;  Laterality: Right;  . Patch angioplasty Right 04/18/2014    Procedure: PATCH ANGIOPLASTY USING HEMASHIELD 0.8cmx 7.6cm PATCH;  Surgeon: Elam Dutch, MD;  Location: [facility] OR;  Service: Vascular;  Laterality: Right;  . Endarterectomy N/A 04/24/2014    Procedure: IRRIGATION AND DEBRIDEMENT OF RIGHT NECK ;  Surgeon: Elam Dutch, MD;  Location: [place];  Service: Vascular;  Laterality: N/A;  . Peripheral vascular catheterization N/A 07/05/2014    Procedure: Lower Extremity Angiography;  Surgeon: [name], MD;  Location: [place] CV LAB;  Service: Cardiovascular;  Laterality: N/A;    No Known Allergies  Current Outpatient Prescriptions  Medication Sig Dispense Refill  . ALPRAZolam (XANAX) 0.25 MG tablet Take 0.25 mg by mouth daily as needed for anxiety.    Marland Kitchen amLODipine (NORVASC) 10 MG tablet Take 10 mg by mouth daily.  5  . aspirin EC 81 MG tablet Take 81 mg by mouth daily.    . cilostazol (PLETAL) 100 MG tablet Take 100 mg by mouth 2 (two) times daily.     . clopidogrel (PLAVIX) 75 MG tablet Take 1 tablet (75 mg total) by mouth daily. 30 tablet 3  . hydrALAZINE (APRESOLINE) 50 MG tablet Take 50 mg by mouth 3 (three) times daily.  0  . HYDROcodone-acetaminophen (NORCO/VICODIN) 5-325 MG per tablet Take 1 tablet by mouth every 6 (six) hours as needed for moderate pain. 30 tablet 0  . Iron-FA-B Cmp-C-Biot-Probiotic (FUSION PLUS) CAPS Take 1 capsule by mouth daily.    . meloxicam (MOBIC) 7.5 MG tablet Take 7.5 mg by mouth daily as needed for pain.     . metFORMIN (GLUCOPHAGE) 500 MG tablet Take 1 tablet (500 mg total) by mouth daily with breakfast.    . metoprolol  (LOPRESSOR) 50 MG tablet Take 50 mg by mouth 2 (two) times daily.    . niacin (NIASPAN) 1000 MG CR tablet Take 1,000 mg by mouth daily.   6  . nitroGLYCERIN (NITROSTAT) 0.4 MG SL tablet Place 0.4 mg under the tongue every 5 (five) minutes as needed for chest pain.     Marland Kitchen omeprazole (PRILOSEC) 20 MG capsule Take 20 mg by mouth daily.    . pravastatin (PRAVACHOL) 40 MG tablet Take 40 mg by mouth daily.    Marland Kitchen tiZANidine (ZANAFLEX) 4 MG tablet Take 4 mg by mouth at bedtime as needed for muscle spasms.   2  . valsartan-hydrochlorothiazide (DIOVAN-HCT) 320-25 MG per tablet Take 1 tablet by mouth every morning.  2  . zolpidem (AMBIEN) 10 MG tablet Take 10 mg by mouth at bedtime as needed for sleep.   2  No current facility-administered medications for this visit.    Review of Systems : See HPI for pertinent positives and negatives.  Physical Examination  Filed Vitals:   11/24/14 1233 11/24/14 1234 11/24/14 1238  BP: 162/71 165/72 166/78  Pulse: 84    Temp: 97.6 F (36.4 C)    TempSrc: Oral    Resp: 20    Height: 5' 1.5" (1.562 m)    Weight: 121 lb (54.885 kg)     Body mass index is 22.5 kg/(m^2).  General: WDWN female in NAD GAIT: slow and deliberate, using cane Eyes: PERRLA Pulmonary: Non-labored, CTAB, no rales, no rhonchi, & no wheezing.  Cardiac: regular rhythm, no detected murmur.  VASCULAR EXAM Carotid Bruits Right Left   Positive, soft Positive, soft   Aorta is not palpable. Radial pulses are 2+ palpable and equal.      LE Pulses Right Left   POPLITEAL not palpable  not palpable   POSTERIOR TIBIAL 1+ palpable  1+ palpable    DORSALIS PEDIS  ANTERIOR TIBIAL 1+ palpable  not palpable     Gastrointestinal: soft, nontender, BS WNL, no r/g, no palpable masses.  Musculoskeletal: generalized mild  muscle atrophy/wasting. M/S 4/5 throughout, extremities without ischemic changes.  Neurologic: A&O X 3; Appropriate Affect, Speech is normal CN 2-12 intact except mild left facial droop with smile, pain and light touch intact in extremities, Motor exam as listed above.            Non-Invasive Vascular Imaging CAROTID DUPLEX 11/24/2014   CEREBROVASCULAR DUPLEX EVALUATION    INDICATION: Carotid artery disease     PREVIOUS INTERVENTION(S): Right carotid endarterectomy March 2016 Left carotid endarterectomy 02/07/2014    DUPLEX EXAM:     RIGHT  LEFT  Peak Systolic Velocities (cm/s) End Diastolic Velocities (cm/s) Plaque LOCATION Peak Systolic Velocities (cm/s) End Diastolic Velocities (cm/s) Plaque  77 19  CCA PROXIMAL 106 26   80 15  CCA MID 125 27   89 21  CCA DISTAL 113 27   301 22  ECA 176 19   91 20 HM ICA PROXIMAL 108 14   110 38  ICA MID 97 39   116 39  ICA DISTAL 60 23     NA ICA / CCA Ratio (PSV) NA  Antegrade  Vertebral Flow Antegrade    Brachial Systolic Pressure (mmHg)   Multiphasic (Subclavian artery) Brachial Artery Waveforms Multiphasic (Subclavian artery)    Plaque Morphology:  HM = Homogeneous, HT = Heterogeneous, CP = Calcific Plaque, SP = Smooth Plaque, IP = Irregular Plaque  ADDITIONAL FINDINGS:     IMPRESSION: Patent right carotid endarterectomy site with evidence of mild hyperplasia in the surgical bulb. Patent left carotid endarterectomy site with no evidence of hyperplasia or restenosis.      Compared to the previous exam:  This is the first post-operative exam following right carotid intervention.      Assessment: Donnia Jemmott is a 64 y.o. female who is s/p left carotid endarterectomy on 02/07/14. She subsequently underwent right carotid endarterectomy on April 18, 2014.  She had a stroke in her late 30's or 1's, states she was hypertensive, states her symptoms were feeling generally weak and dizzy,  she has trouble remembering what her  symptoms were. She denies any residual hemiparesis, denies any speech difficulties, denies any monocular visual problems. She denies any subsequent strokes or TIA's. Today's carotid duplex suggests a patent right carotid endarterectomy site with evidence of mild hyperplasia in the surgical bulb. Patent  left carotid endarterectomy site with no evidence of hyperplasia or restenosis. This is the first post-operative exam following right carotid intervention.   Pt advised to see her PCP ASAP to address her elevated blood pressure; states she has not been checking her blood pressure lately at home.   Plan: Follow-up in 6 months with Carotid Duplex scan.   I discussed in depth with the patient the nature of atherosclerosis, and emphasized the importance of maximal medical management including strict control of blood pressure, blood glucose, and lipid levels, obtaining regular exercise, and continued cessation of smoking.  The patient is aware that without maximal medical management the underlying atherosclerotic disease process will progress, limiting the benefit of any interventions. The patient was given information about stroke prevention and what symptoms should prompt the patient to seek immediate medical care. Thank you for allowing [place] to participate in this patient's care.  Clemon Chambers, RN, MSN, FNP-C [facility] Office: [phone]  Clinic Physician: [name]  11/24/2014 12:45 PM

## 2014-11-24 NOTE — Progress Notes (Signed)
Filed Vitals:   11/24/14 1233 11/24/14 1234 11/24/14 1238  BP: 162/71 165/72 166/78  Pulse: 84    Temp: 97.6 F (36.4 C)    TempSrc: Oral    Resp: 20    Height: 5' 1.5" (1.562 m)    Weight: 121 lb (54.885 kg)

## 2014-11-24 NOTE — Patient Instructions (Signed)
Stroke Prevention Some medical conditions and behaviors are associated with an increased chance of having a stroke. You may prevent a stroke by making healthy choices and managing medical conditions. HOW CAN I REDUCE MY RISK OF HAVING A STROKE?   Stay physically active. Get at least 30 minutes of activity on most or all days.  Do not smoke. It may also be helpful to avoid exposure to secondhand smoke.  Limit alcohol use. Moderate alcohol use is considered to be:  No more than 2 drinks per day for men.  No more than 1 drink per day for nonpregnant women.  Eat healthy foods. This involves:  Eating 5 or more servings of fruits and vegetables a day.  Making dietary changes that address high blood pressure (hypertension), high cholesterol, diabetes, or obesity.  Manage your cholesterol levels.  Making food choices that are high in fiber and low in saturated fat, trans fat, and cholesterol may control cholesterol levels.  Take any prescribed medicines to control cholesterol as directed by your health care provider.  Manage your diabetes.  Controlling your carbohydrate and sugar intake is recommended to manage diabetes.  Take any prescribed medicines to control diabetes as directed by your health care provider.  Control your hypertension.  Making food choices that are low in salt (sodium), saturated fat, trans fat, and cholesterol is recommended to manage hypertension.  Ask your health care provider if you need treatment to lower your blood pressure. Take any prescribed medicines to control hypertension as directed by your health care provider.  If you are 18-39 years of age, have your blood pressure checked every 3-5 years. If you are 40 years of age or older, have your blood pressure checked every year.  Maintain a healthy weight.  Reducing calorie intake and making food choices that are low in sodium, saturated fat, trans fat, and cholesterol are recommended to manage  weight.  Stop drug abuse.  Avoid taking birth control pills.  Talk to your health care provider about the risks of taking birth control pills if you are over 35 years old, smoke, get migraines, or have ever had a blood clot.  Get evaluated for sleep disorders (sleep apnea).  Talk to your health care provider about getting a sleep evaluation if you snore a lot or have excessive sleepiness.  Take medicines only as directed by your health care provider.  For some people, aspirin or blood thinners (anticoagulants) are helpful in reducing the risk of forming abnormal blood clots that can lead to stroke. If you have the irregular heart rhythm of atrial fibrillation, you should be on a blood thinner unless there is a good reason you cannot take them.  Understand all your medicine instructions.  Make sure that other conditions (such as anemia or atherosclerosis) are addressed. SEEK IMMEDIATE MEDICAL CARE IF:   You have sudden weakness or numbness of the face, arm, or leg, especially on one side of the body.  Your face or eyelid droops to one side.  You have sudden confusion.  You have trouble speaking (aphasia) or understanding.  You have sudden trouble seeing in one or both eyes.  You have sudden trouble walking.  You have dizziness.  You have a loss of balance or coordination.  You have a sudden, severe headache with no known cause.  You have new chest pain or an irregular heartbeat. Any of these symptoms may represent a serious problem that is an emergency. Do not wait to see if the symptoms will   go away. Get medical help at once. Call your local emergency services (911 in [place] Do not drive yourself to the hospital.   This information is not intended to replace advice given to you by your health care provider. Make sure you discuss any questions you have with your health care provider.   Document Released: 03/07/2004 Document Revised: 02/18/2014 Document Reviewed:  07/31/2012 Elsevier Interactive Patient Education 2016 [employer]  

## 2014-11-25 NOTE — Addendum Note (Signed)
Addended by: Mena Goes on: 11/25/2014 02:57 PM   Modules accepted: Orders

## 2014-11-27 DIAGNOSIS — I10 Essential (primary) hypertension: Secondary | ICD-10-CM | POA: Diagnosis not present

## 2014-11-27 DIAGNOSIS — H1131 Conjunctival hemorrhage, right eye: Secondary | ICD-10-CM | POA: Diagnosis not present

## 2014-12-08 ENCOUNTER — Other Ambulatory Visit: Payer: Self-pay | Admitting: Cardiology

## 2014-12-08 DIAGNOSIS — Z9889 Other specified postprocedural states: Secondary | ICD-10-CM | POA: Diagnosis not present

## 2014-12-08 DIAGNOSIS — I10 Essential (primary) hypertension: Secondary | ICD-10-CM | POA: Diagnosis not present

## 2014-12-08 DIAGNOSIS — I150 Renovascular hypertension: Secondary | ICD-10-CM | POA: Diagnosis not present

## 2014-12-08 DIAGNOSIS — I739 Peripheral vascular disease, unspecified: Secondary | ICD-10-CM | POA: Diagnosis not present

## 2014-12-12 ENCOUNTER — Other Ambulatory Visit: Payer: Medicare Other

## 2014-12-12 ENCOUNTER — Other Ambulatory Visit: Payer: Self-pay | Admitting: Cardiology

## 2014-12-12 DIAGNOSIS — I150 Renovascular hypertension: Secondary | ICD-10-CM

## 2014-12-16 ENCOUNTER — Ambulatory Visit
Admission: RE | Admit: 2014-12-16 | Discharge: 2014-12-16 | Disposition: A | Discharge status: Home / Self Care | Payer: Medicare Other | Source: Ambulatory Visit | Attending: Cardiology | Admitting: Cardiology

## 2014-12-16 DIAGNOSIS — I150 Renovascular hypertension: Secondary | ICD-10-CM

## 2014-12-28 DIAGNOSIS — Z9889 Other specified postprocedural states: Secondary | ICD-10-CM | POA: Diagnosis not present

## 2014-12-28 DIAGNOSIS — E1122 Type 2 diabetes mellitus with diabetic chronic kidney disease: Secondary | ICD-10-CM | POA: Diagnosis not present

## 2014-12-28 DIAGNOSIS — I10 Essential (primary) hypertension: Secondary | ICD-10-CM | POA: Diagnosis not present

## 2014-12-28 DIAGNOSIS — I739 Peripheral vascular disease, unspecified: Secondary | ICD-10-CM | POA: Diagnosis not present

## 2015-01-10 DIAGNOSIS — Z1231 Encounter for screening mammogram for malignant neoplasm of breast: Secondary | ICD-10-CM | POA: Diagnosis not present

## 2015-01-12 DIAGNOSIS — E784 Other hyperlipidemia: Secondary | ICD-10-CM | POA: Diagnosis not present

## 2015-01-12 DIAGNOSIS — I10 Essential (primary) hypertension: Secondary | ICD-10-CM | POA: Diagnosis not present

## 2015-01-12 DIAGNOSIS — I2510 Atherosclerotic heart disease of native coronary artery without angina pectoris: Secondary | ICD-10-CM | POA: Diagnosis not present

## 2015-01-12 DIAGNOSIS — I6789 Other cerebrovascular disease: Secondary | ICD-10-CM | POA: Diagnosis not present

## 2015-01-12 DIAGNOSIS — E1159 Type 2 diabetes mellitus with other circulatory complications: Secondary | ICD-10-CM | POA: Diagnosis not present

## 2015-05-24 ENCOUNTER — Encounter: Payer: Self-pay | Admitting: Family

## 2015-06-01 ENCOUNTER — Ambulatory Visit (INDEPENDENT_AMBULATORY_CARE_PROVIDER_SITE_OTHER): Payer: Medicare Other | Admitting: Family

## 2015-06-01 ENCOUNTER — Encounter: Payer: Self-pay | Admitting: Family

## 2015-06-01 ENCOUNTER — Ambulatory Visit (HOSPITAL_COMMUNITY)
Admission: RE | Admit: 2015-06-01 | Discharge: 2015-06-01 | Disposition: A | Discharge status: Home / Self Care | Payer: Medicare Other | Source: Ambulatory Visit | Attending: Family | Admitting: Family

## 2015-06-01 VITALS — BP 168/80 | HR 77 | Ht 61.5 in | Wt 131.3 lb

## 2015-06-01 DIAGNOSIS — I6521 Occlusion and stenosis of right carotid artery: Secondary | ICD-10-CM | POA: Diagnosis not present

## 2015-06-01 DIAGNOSIS — E119 Type 2 diabetes mellitus without complications: Secondary | ICD-10-CM | POA: Diagnosis not present

## 2015-06-01 DIAGNOSIS — K219 Gastro-esophageal reflux disease without esophagitis: Secondary | ICD-10-CM | POA: Diagnosis not present

## 2015-06-01 DIAGNOSIS — I779 Disorder of arteries and arterioles, unspecified: Secondary | ICD-10-CM | POA: Diagnosis not present

## 2015-06-01 DIAGNOSIS — Z48812 Encounter for surgical aftercare following surgery on the circulatory system: Secondary | ICD-10-CM | POA: Insufficient documentation

## 2015-06-01 DIAGNOSIS — Z87891 Personal history of nicotine dependence: Secondary | ICD-10-CM | POA: Insufficient documentation

## 2015-06-01 DIAGNOSIS — I6523 Occlusion and stenosis of bilateral carotid arteries: Secondary | ICD-10-CM

## 2015-06-01 DIAGNOSIS — R030 Elevated blood-pressure reading, without diagnosis of hypertension: Secondary | ICD-10-CM

## 2015-06-01 DIAGNOSIS — I2510 Atherosclerotic heart disease of native coronary artery without angina pectoris: Secondary | ICD-10-CM | POA: Diagnosis not present

## 2015-06-01 DIAGNOSIS — I739 Peripheral vascular disease, unspecified: Secondary | ICD-10-CM | POA: Diagnosis not present

## 2015-06-01 DIAGNOSIS — Z9889 Other specified postprocedural states: Secondary | ICD-10-CM | POA: Insufficient documentation

## 2015-06-01 DIAGNOSIS — IMO0001 Reserved for inherently not codable concepts without codable children: Secondary | ICD-10-CM

## 2015-06-01 DIAGNOSIS — F329 Major depressive disorder, single episode, unspecified: Secondary | ICD-10-CM | POA: Insufficient documentation

## 2015-06-01 DIAGNOSIS — I10 Essential (primary) hypertension: Secondary | ICD-10-CM | POA: Insufficient documentation

## 2015-06-01 DIAGNOSIS — F419 Anxiety disorder, unspecified: Secondary | ICD-10-CM | POA: Insufficient documentation

## 2015-06-01 NOTE — Patient Instructions (Signed)
Stroke Prevention Some medical conditions and behaviors are associated with an increased chance of having a stroke. You may prevent a stroke by making healthy choices and managing medical conditions. HOW CAN I REDUCE MY RISK OF HAVING A STROKE?   Stay physically active. Get at least 30 minutes of activity on most or all days.  Do not smoke. It may also be helpful to avoid exposure to secondhand smoke.  Limit alcohol use. Moderate alcohol use is considered to be:  No more than 2 drinks per day for men.  No more than 1 drink per day for nonpregnant women.  Eat healthy foods. This involves:  Eating 5 or more servings of fruits and vegetables a day.  Making dietary changes that address high blood pressure (hypertension), high cholesterol, diabetes, or obesity.  Manage your cholesterol levels.  Making food choices that are high in fiber and low in saturated fat, trans fat, and cholesterol may control cholesterol levels.  Take any prescribed medicines to control cholesterol as directed by your health care provider.  Manage your diabetes.  Controlling your carbohydrate and sugar intake is recommended to manage diabetes.  Take any prescribed medicines to control diabetes as directed by your health care provider.  Control your hypertension.  Making food choices that are low in salt (sodium), saturated fat, trans fat, and cholesterol is recommended to manage hypertension.  Ask your health care provider if you need treatment to lower your blood pressure. Take any prescribed medicines to control hypertension as directed by your health care provider.  If you are 18-39 years of age, have your blood pressure checked every 3-5 years. If you are 40 years of age or older, have your blood pressure checked every year.  Maintain a healthy weight.  Reducing calorie intake and making food choices that are low in sodium, saturated fat, trans fat, and cholesterol are recommended to manage  weight.  Stop drug abuse.  Avoid taking birth control pills.  Talk to your health care provider about the risks of taking birth control pills if you are over 35 years old, smoke, get migraines, or have ever had a blood clot.  Get evaluated for sleep disorders (sleep apnea).  Talk to your health care provider about getting a sleep evaluation if you snore a lot or have excessive sleepiness.  Take medicines only as directed by your health care provider.  For some people, aspirin or blood thinners (anticoagulants) are helpful in reducing the risk of forming abnormal blood clots that can lead to stroke. If you have the irregular heart rhythm of atrial fibrillation, you should be on a blood thinner unless there is a good reason you cannot take them.  Understand all your medicine instructions.  Make sure that other conditions (such as anemia or atherosclerosis) are addressed. SEEK IMMEDIATE MEDICAL CARE IF:   You have sudden weakness or numbness of the face, arm, or leg, especially on one side of the body.  Your face or eyelid droops to one side.  You have sudden confusion.  You have trouble speaking (aphasia) or understanding.  You have sudden trouble seeing in one or both eyes.  You have sudden trouble walking.  You have dizziness.  You have a loss of balance or coordination.  You have a sudden, severe headache with no known cause.  You have new chest pain or an irregular heartbeat. Any of these symptoms may represent a serious problem that is an emergency. Do not wait to see if the symptoms will   go away. Get medical help at once. Call your local emergency services (911 in [place] Do not drive yourself to the hospital.   This information is not intended to replace advice given to you by your health care provider. Make sure you discuss any questions you have with your health care provider.   Document Released: 03/07/2004 Document Revised: 02/18/2014 Document Reviewed:  07/31/2012 Elsevier Interactive Patient Education 2016 [employer]  

## 2015-06-01 NOTE — Progress Notes (Signed)
[facility] [place]  Patient name: Joanne Morris MRN: [id number] DOB: 1950-09-07 Sex: female  REASON FOR VISIT: follow up extracranial carotid artery stenosis   HPI: Joanne Morris is a 65 y.o. female patient of Dr. [name] who is s/p left carotid endarterectomy on 02/07/14. She subsequently underwent right carotid endarterectomy on April 18, 2014. This was complicated by a postop seroma which was evacuated. Cultures were taken of this which were negative. She was placed on Keflex for 2 weeks. She has completed her course of antibiotics. Pt last saw Dr. [name] on 05/11/14. At that time she was doing well status post bilateral carotid endarterectomies and was to follow-up with carotid duplex scan in 6 months. She was hypertensive in the office that day and was to follow up with Dr. [name] for medication titration.   She uses a cane to help with ambulation as she states she is a little unsteady, this is not worsening however.  She had a stroke in her late 61's or 13's, states she was hypertensive, states her symptoms were feeling generally weak and dizzy, but states she has trouble remembering what her symptoms were. She denies any residual hemiparesis, denies any speech difficulties, denies any monocular visual problems. She denies any subsequent strokes or TIA's.  Review of records: Dr. [name] performed LE arteriogram in May 2016, unable to find documentation whether intervention was performed, pt does not recall if intervention was performed. Pt states Dr. [name] is continuing to monitor the circulation in her legs.   Pt takes 5 antihypertensive mediations.   12/16/14 renal artery duplex requested by Dr. [name]: 1. No evidence of renal artery stenosis or other vascular lesion to suggest any etiology of renovascular hypertension. If there is continued clinical concern, renal MRA (lower radiation risk, can be performed noncontrast in the setting of renal dysfunction) and CTA  ( higher spatial resolution) represent more accurate studies, which are additionally more sensitive to the detection of duplicated renal Arteries.   Pt Diabetic: yes, states in good control Pt smoker: former smoker, quit December 2015, rare smoking prior to [place], took 2-3 weeks to smoke a pack, started in her late 96's  Pt meds include: Statin : yes ASA: yes Other anticoagulants/antiplatelets: clopidogrel, cilostazol   Past Medical History  Diagnosis Date  . Hypertension   . Coronary artery disease   . Angina   . Depression   . Claudication ([place])   . PAD (peripheral artery disease) ([place])   . High cholesterol   . Myocardial infarction ([place]) 1990's    "1"  . Type II diabetes mellitus ([place])   . GERD (gastroesophageal reflux disease)   . Stroke [facility])     "mini stroke 1st then regular stroke", denies residual on 07/20/2013  . Arthritis     "knees" (07/20/2013)  . Anxiety   . Carotid stenosis   . Carotid stenosis   . Wears glasses     No family history on file.  SOCIAL HISTORY: Social History  Substance Use Topics  . Smoking status: Former Smoker -- 0.50 packs/day for 4 years    Types: Cigarettes    Quit date: 05/30/2009  . Smokeless tobacco: Never Used  . Alcohol Use: No    No Known Allergies  Current Outpatient Prescriptions  Medication Sig Dispense Refill  . ALPRAZolam (XANAX) 0.25 MG tablet Take 0.25 mg by mouth daily as needed for anxiety.    Marland Kitchen amLODipine (NORVASC) 10 MG tablet Take 10 mg by mouth daily.  5  . aspirin EC 81 MG tablet Take 81 mg by mouth daily.    . cilostazol (PLETAL) 100 MG tablet Take 100 mg by mouth 2 (two) times daily.     . clopidogrel (PLAVIX) 75 MG tablet Take 1 tablet (75 mg total) by mouth daily. 30 tablet 3  . HYDROcodone-acetaminophen (NORCO/VICODIN) 5-325 MG per tablet Take 1 tablet by mouth every 6 (six) hours as needed for moderate pain. 30 tablet 0  . Iron-FA-B Cmp-C-Biot-Probiotic (FUSION PLUS) CAPS Take 1 capsule by mouth  daily.    . meloxicam (MOBIC) 7.5 MG tablet Take 7.5 mg by mouth daily as needed for pain.     . metFORMIN (GLUCOPHAGE) 500 MG tablet Take 1 tablet (500 mg total) by mouth daily with breakfast.    . niacin (NIASPAN) 1000 MG CR tablet Take 1,000 mg by mouth daily.   6  . nitroGLYCERIN (NITROSTAT) 0.4 MG SL tablet Place 0.4 mg under the tongue every 5 (five) minutes as needed for chest pain.     Marland Kitchen omeprazole (PRILOSEC) 20 MG capsule Take 20 mg by mouth daily.    . pravastatin (PRAVACHOL) 40 MG tablet Take 40 mg by mouth daily.    Marland Kitchen tiZANidine (ZANAFLEX) 4 MG tablet Take 4 mg by mouth at bedtime as needed for muscle spasms.   2  . zolpidem (AMBIEN) 10 MG tablet Take 10 mg by mouth at bedtime as needed for sleep.   2  . hydrALAZINE (APRESOLINE) 50 MG tablet Take 50 mg by mouth 3 (three) times daily.  0  . metoprolol (LOPRESSOR) 50 MG tablet Take 50 mg by mouth 2 (two) times daily.    . valsartan-hydrochlorothiazide (DIOVAN-HCT) 320-25 MG per tablet Take 1 tablet by mouth every morning.  2   No current facility-administered medications for this visit.    REVIEW OF SYSTEMS:  [X]  denotes positive finding, [ ]  denotes negative finding Cardiac  Comments:  Chest pain or chest pressure: no   Shortness of breath upon exertion:    Short of breath when lying flat:    Irregular heart rhythm:        Vascular    Pain in calf, thigh, or hip brought on by ambulation:    Pain in feet at night that wakes you up from your sleep:     Blood clot in your veins:    Leg swelling:         Pulmonary    Oxygen at home:    Productive cough:     Wheezing:         Neurologic    Sudden weakness in arms or legs:     Sudden numbness in arms or legs:     Sudden onset of difficulty speaking or slurred speech:    Temporary loss of vision in one eye:     Problems with dizziness:         Gastrointestinal    Blood in stool:     Vomited blood:         Genitourinary    Burning when urinating:     Blood in urine:         Psychiatric    Major depression:         Hematologic    Bleeding problems:    Problems with blood clotting too easily:        Skin    Rashes or ulcers:        Constitutional    Fever or  chills:      PHYSICAL EXAM: Filed Vitals:   06/01/15 1442 06/01/15 1443  BP: 162/78 168/80  Pulse: 77   Height: 5' 1.5" (1.562 m)   Weight: 131 lb 4.8 oz (59.557 kg)   SpO2: 100%   Body mass index is 24.41 kg/(m^2).   GENERAL: The patient is a well-nourished female, in no acute distress. The vital signs are documented above. CARDIAC: There is a regular rate and rhythm.  VASCULAR:  + right carotid bruit,right pedal pulses are 2+ palpable, left are faintly palpable.  PULMONARY: There is good air exchange bilaterally without wheezing or rales. ABDOMEN: Soft and non-tender with normal pitched bowel sounds.  MUSCULOSKELETAL: There are no major deformities or cyanosis. NEUROLOGIC: No focal weakness or paresthesias are detected. CN 2-12 intact except mild left facial droop with smile. SKIN: There are no ulcers or rashes noted. PSYCHIATRIC: The patient has a normal affect.  DATA:   Carotid duplex: Bilateral carotid endarterectomy with no restenosis. >50% stenosis of the right ECA. Mild diffuse disease throughout the left CCA. Bilateral vertebral artery is antegrade. No significant change compared to prior exam.   MEDICAL ISSUES: Joanne Morris is a 65 y.o. female patient of Dr. [name] who is s/p left carotid endarterectomy on 02/07/14. She subsequently underwent right carotid endarterectomy on April 18, 2014.  She had a stroke in her late 18's or 72's, states she was hypertensive; no subsequent stroke or TIA.  Dr. [name] monitors pt's lower extremity PAD, pt denies claudication sx's with walking, has no signs of ischemia in her feet/legs.  Today's carotid duplex: Bilateral carotid endarterectomy with no restenosis. >50% stenosis of the right ECA. Mild diffuse disease throughout the  left CCA. Bilateral vertebral artery is antegrade. No significant change compared to prior exam.  I advised to pt to see her PCP re her elevated blood pressure.   Pt advised to return in a year for carotid duplex.   I discussed in depth with the patient the nature of atherosclerosis, and emphasized the importance of maximal medical management including strict control of blood pressure, blood glucose, and lipid levels, obtaining regular exercise, and continued cessation of smoking. The patient is aware that without maximal medical management the underlying atherosclerotic disease process will progress, limiting the benefit of any interventions. The patient was given information about stroke prevention and what symptoms should prompt the patient to seek immediate medical care.   Joanne Morris, Sharmon Leyden, RN, MSN, FNP-C [facility]

## 2015-07-07 NOTE — Addendum Note (Signed)
Addended by: Mena Goes on: 07/07/2015 01:28 PM   Modules accepted: Orders

## 2015-08-14 ENCOUNTER — Encounter: Payer: Self-pay | Admitting: Cardiology

## 2015-09-04 ENCOUNTER — Telehealth: Payer: Self-pay

## 2015-09-04 NOTE — Telephone Encounter (Signed)
Rec'd phone call from pt.  Reported she has bilateral neck soreness.  Stated she has had this for "months", and "it comes and goes."   Denied any signs of redness, swelling, or skin breakdown at CEA incision sites.  Denied fever/ chills.  Denied any injury to the neck.  Reported has had a massage and that it helped. Stated she thinks she may need to get a different pillow.  Advised she should make an appt. with her PCP to examine, and determine if she is having musculoskeletal pain.  Advised that if her PCP felt she should be evaluated by the vascular surgeon, then we can schedule an appt.   Pt. verb. understanding and agreed to call the PCP.

## 2016-03-12 ENCOUNTER — Encounter: Payer: Self-pay | Admitting: Cardiology

## 2016-03-12 ENCOUNTER — Telehealth: Payer: Self-pay

## 2016-03-12 NOTE — Telephone Encounter (Signed)
rec'd call from pt. with request to move her f/u appt. To an earlier date.  C/o having soreness in both sides of her neck, "where my surgery was."  Stated that the incisions are healed; denied redness or warmth.  Denied fever, but stated "I stay cold all the time."  Stated the incisional areas are "somewhat tender."  Described the incisional areas as being "a little thicker".  Denied any problems with swallowing.  Stated she saw her PCP, and was informed that it may be related to scar tissue, and that he could refer her to a Dermatologist, for further eval. The pt. stated she wants to have Dr. [name] look at it, and decide what needs to be done.  Stated she doesn't want to wait until April to have it checked.  Advised will have a Scheduler contact her with options for an earlier appt.  Pt. Agreed.

## 2016-03-21 ENCOUNTER — Encounter: Payer: Self-pay | Admitting: Vascular Surgery

## 2016-03-22 ENCOUNTER — Ambulatory Visit (HOSPITAL_COMMUNITY)
Admission: RE | Admit: 2016-03-22 | Discharge: 2016-03-22 | Disposition: A | Discharge status: Home / Self Care | Payer: Medicare Other | Source: Ambulatory Visit | Attending: Family | Admitting: Family

## 2016-03-22 DIAGNOSIS — Z87891 Personal history of nicotine dependence: Secondary | ICD-10-CM | POA: Diagnosis not present

## 2016-03-22 DIAGNOSIS — Z48812 Encounter for surgical aftercare following surgery on the circulatory system: Secondary | ICD-10-CM | POA: Diagnosis not present

## 2016-03-22 DIAGNOSIS — I6523 Occlusion and stenosis of bilateral carotid arteries: Secondary | ICD-10-CM | POA: Diagnosis present

## 2016-03-22 DIAGNOSIS — Z9889 Other specified postprocedural states: Secondary | ICD-10-CM

## 2016-03-22 DIAGNOSIS — R030 Elevated blood-pressure reading, without diagnosis of hypertension: Secondary | ICD-10-CM

## 2016-03-22 DIAGNOSIS — IMO0001 Reserved for inherently not codable concepts without codable children: Secondary | ICD-10-CM

## 2016-03-22 LAB — VAS US CAROTID
LCCADDIAS: -26 cm/s
LCCADSYS: -123 cm/s
LCCAPDIAS: 22 cm/s
LCCAPSYS: 117 cm/s
LEFT ECA DIAS: -8 cm/s
LEFT VERTEBRAL DIAS: 11 cm/s
LICAPSYS: 114 cm/s
Left ICA dist dias: -42 cm/s
Left ICA dist sys: -121 cm/s
Left ICA prox dias: 22 cm/s
RCCAPSYS: 73 cm/s
RIGHT CCA MID DIAS: 16 cm/s
RIGHT ECA DIAS: -26 cm/s
RIGHT VERTEBRAL DIAS: 9 cm/s
Right CCA prox dias: 16 cm/s
Right cca dist sys: -96 cm/s

## 2016-03-28 ENCOUNTER — Encounter: Payer: Self-pay | Admitting: Vascular Surgery

## 2016-03-28 ENCOUNTER — Ambulatory Visit (INDEPENDENT_AMBULATORY_CARE_PROVIDER_SITE_OTHER): Payer: Medicare Other | Admitting: Vascular Surgery

## 2016-03-28 VITALS — BP 153/81 | HR 72 | Temp 98.1°F | Resp 18 | Ht 61.5 in | Wt 134.4 lb

## 2016-03-28 DIAGNOSIS — I6523 Occlusion and stenosis of bilateral carotid arteries: Secondary | ICD-10-CM

## 2016-03-28 NOTE — Progress Notes (Signed)
Patient is a 66 year old female who returns for follow-up today. She underwent left carotid endarterectomy 2015. She subsequently underwent right carotid endarterectomy 2016. This was complicated by a postop seroma. Cultures were taken of this which were negative. She was placed on Keflex for 2 weeks. She has completed her course of antibiotics. She denies any further drainage or increased swelling in the right neck.  She denies any symptoms of TIA amaurosis or stroke. She primarily complains of thickening of the scar of both carotid incisions. Other chronic medical problems include hypertension elevated cholesterol diabetes all of which are currently stable.  Past Medical History:  Diagnosis Date  . Angina   . Anxiety   . Arthritis    "knees" (07/20/2013)  . Carotid stenosis   . Carotid stenosis   . Claudication ([place])   . Coronary artery disease   . Depression   . GERD (gastroesophageal reflux disease)   . High cholesterol   . Hypertension   . Myocardial infarction 1990's   "1"  . PAD (peripheral artery disease) ([place])   . Stroke [facility])    "mini stroke 1st then regular stroke", denies residual on 07/20/2013  . Type II diabetes mellitus ([place])   . Wears glasses    Past Surgical History:  Procedure Laterality Date  . ABDOMINAL HYSTERECTOMY    . ATHERECTOMY  10/05/2013  . BALLOON ANGIOPLASTY, ARTERY  10/05/2013   DR [name]  . CAROTID ENDARTERECTOMY    . CORONARY ANGIOPLASTY WITH STENT PLACEMENT     "1"  . ENDARTERECTOMY Left 02/07/2014   Procedure: ENDARTERECTOMY CAROTID;  Surgeon: Elam Dutch, MD;  Location: [place];  Service: Vascular;  Laterality: Left;  . ENDARTERECTOMY Right 04/18/2014   Procedure: ENDARTERECTOMY RIGHT CAROTID;  Surgeon: Elam Dutch, MD;  Location: [place];  Service: Vascular;  Laterality: Right;  . ENDARTERECTOMY N/A 04/24/2014   Procedure: IRRIGATION AND DEBRIDEMENT OF RIGHT NECK ;  Surgeon: Elam Dutch, MD;  Location: [place];  Service: Vascular;  Laterality:  N/A;  . ESOPHAGOGASTRODUODENOSCOPY N/A 10/13/2012   Procedure: ESOPHAGOGASTRODUODENOSCOPY (EGD);  Surgeon: Gatha Mayer, MD;  Location: [facility] ENDOSCOPY;  Service: Endoscopy;  Laterality: N/A;  . LOWER EXTREMITY ANGIOGRAM  07/20/2013   Unsuccessful attempt at crossing the CTO/notes 07/20/2013  . LOWER EXTREMITY ANGIOGRAM N/A 07/06/2013   Procedure: LOWER EXTREMITY ANGIOGRAM;  Surgeon: Laverda Page, MD;  Location: [facility] CATH LAB;  Service: Cardiovascular;  Laterality: N/A;  . LOWER EXTREMITY ANGIOGRAM N/A 07/20/2013   Procedure: LOWER EXTREMITY ANGIOGRAM;  Surgeon: Laverda Page, MD;  Location: [facility] CATH LAB;  Service: Cardiovascular;  Laterality: N/A;  . LOWER EXTREMITY ANGIOGRAM N/A 10/05/2013   Procedure: LOWER EXTREMITY ANGIOGRAM;  Surgeon: Laverda Page, MD;  Location: [facility] CATH LAB;  Service: Cardiovascular;  Laterality: N/A;  . PATCH ANGIOPLASTY Left 02/07/2014   Procedure: PATCH ANGIOPLASTY Carotid;  Surgeon: Elam Dutch, MD;  Location: [place];  Service: Vascular;  Laterality: Left;  . PATCH ANGIOPLASTY Right 04/18/2014   Procedure: PATCH ANGIOPLASTY USING HEMASHIELD 0.8cmx 7.6cm PATCH;  Surgeon: Elam Dutch, MD;  Location: [place];  Service: Vascular;  Laterality: Right;  . PERIPHERAL VASCULAR CATHETERIZATION N/A 07/05/2014   Procedure: Lower Extremity Angiography;  Surgeon: [name], MD;  Location: [place] CV LAB;  Service: Cardiovascular;  Laterality: N/A;  . TUBAL LIGATION       Physical exam:    Vitals:   03/28/16 1019 03/28/16 1021  BP: (!) 155/83 (!) 153/81  Pulse: 72  Resp: 18   Temp: 98.1 F (36.7 C)   TempSrc: Oral   SpO2: 98%   Weight: 134 lb 6.4 oz (61 kg)   Height: 5' 1.5" (1.562 m)      Neck: Left neck incision well-healed, right neck incision well-healed Thickened scar central portion of both incisions fairly symmetric left and right no erythema no drainage   Neuro: Symmetric upper extremity and lower extremity motor strength 5 over 5 no difficulty  swallowing tongue midline   Assessment: Doing well status post bilateral carotid endarterectomies. The patient will follow-up with carotid duplex scan in 1 year   I believe she has developed keloids in both incisions. I had a discussion with Dr. Migdalia Dk from plastic surgery today. These also could represent hypertrophic scar. Dr. Migdalia Dk has kindly offered to evaluate the patient and we will schedule an appointment with her in the next few weeks.      Ruta Hinds, MD [facility] [place] Office: [phone] Pager: [phone]

## 2016-04-04 NOTE — Addendum Note (Signed)
Addended by: Lianne Cure A on: 04/04/2016 04:01 PM   Modules accepted: Orders

## 2016-04-15 ENCOUNTER — Encounter (HOSPITAL_COMMUNITY): Admission: RE | Payer: Self-pay | Source: Ambulatory Visit

## 2016-04-15 ENCOUNTER — Ambulatory Visit (HOSPITAL_COMMUNITY): Admission: RE | Admit: 2016-04-15 | Payer: Medicare Other | Source: Ambulatory Visit | Admitting: Plastic Surgery

## 2016-04-15 SURGERY — EXCISION MASS
Anesthesia: Choice | Laterality: Left

## 2016-04-29 ENCOUNTER — Ambulatory Visit: Admit: 2016-04-29 | Payer: Medicare Other | Admitting: Plastic Surgery

## 2016-04-29 SURGERY — EXCISION MASS
Anesthesia: Choice | Laterality: Left

## 2016-06-06 ENCOUNTER — Ambulatory Visit: Payer: Medicare Other | Admitting: Family

## 2016-06-06 ENCOUNTER — Encounter (HOSPITAL_COMMUNITY): Payer: Medicare Other

## 2016-07-29 ENCOUNTER — Ambulatory Visit: Payer: Self-pay | Admitting: Plastic Surgery

## 2016-07-30 ENCOUNTER — Ambulatory Visit: Payer: Self-pay | Admitting: Plastic Surgery

## 2016-07-30 DIAGNOSIS — L910 Hypertrophic scar: Secondary | ICD-10-CM

## 2016-08-15 ENCOUNTER — Encounter (HOSPITAL_BASED_OUTPATIENT_CLINIC_OR_DEPARTMENT_OTHER): Payer: Self-pay | Admitting: *Deleted

## 2016-08-22 ENCOUNTER — Ambulatory Visit (HOSPITAL_BASED_OUTPATIENT_CLINIC_OR_DEPARTMENT_OTHER): Payer: Medicare Other | Admitting: Anesthesiology

## 2016-08-22 ENCOUNTER — Encounter (HOSPITAL_BASED_OUTPATIENT_CLINIC_OR_DEPARTMENT_OTHER)
Admission: RE | Disposition: A | Discharge status: Home / Self Care | Payer: Self-pay | Source: Ambulatory Visit | Attending: Plastic Surgery

## 2016-08-22 ENCOUNTER — Ambulatory Visit (HOSPITAL_BASED_OUTPATIENT_CLINIC_OR_DEPARTMENT_OTHER)
Admission: RE | Admit: 2016-08-22 | Discharge: 2016-08-22 | Disposition: A | Discharge status: Home / Self Care | Payer: Medicare Other | Source: Ambulatory Visit | Attending: Plastic Surgery | Admitting: Plastic Surgery

## 2016-08-22 ENCOUNTER — Encounter (HOSPITAL_BASED_OUTPATIENT_CLINIC_OR_DEPARTMENT_OTHER): Payer: Self-pay

## 2016-08-22 DIAGNOSIS — I6529 Occlusion and stenosis of unspecified carotid artery: Secondary | ICD-10-CM | POA: Insufficient documentation

## 2016-08-22 DIAGNOSIS — E7800 Pure hypercholesterolemia, unspecified: Secondary | ICD-10-CM | POA: Insufficient documentation

## 2016-08-22 DIAGNOSIS — I252 Old myocardial infarction: Secondary | ICD-10-CM | POA: Diagnosis present

## 2016-08-22 DIAGNOSIS — K219 Gastro-esophageal reflux disease without esophagitis: Secondary | ICD-10-CM | POA: Insufficient documentation

## 2016-08-22 DIAGNOSIS — Z8673 Personal history of transient ischemic attack (TIA), and cerebral infarction without residual deficits: Secondary | ICD-10-CM | POA: Diagnosis not present

## 2016-08-22 DIAGNOSIS — I10 Essential (primary) hypertension: Secondary | ICD-10-CM | POA: Diagnosis not present

## 2016-08-22 DIAGNOSIS — Z7982 Long term (current) use of aspirin: Secondary | ICD-10-CM | POA: Diagnosis not present

## 2016-08-22 DIAGNOSIS — F419 Anxiety disorder, unspecified: Secondary | ICD-10-CM | POA: Insufficient documentation

## 2016-08-22 DIAGNOSIS — M1990 Unspecified osteoarthritis, unspecified site: Secondary | ICD-10-CM | POA: Diagnosis not present

## 2016-08-22 DIAGNOSIS — F329 Major depressive disorder, single episode, unspecified: Secondary | ICD-10-CM | POA: Diagnosis not present

## 2016-08-22 DIAGNOSIS — Z7984 Long term (current) use of oral hypoglycemic drugs: Secondary | ICD-10-CM | POA: Diagnosis not present

## 2016-08-22 DIAGNOSIS — Z955 Presence of coronary angioplasty implant and graft: Secondary | ICD-10-CM | POA: Insufficient documentation

## 2016-08-22 DIAGNOSIS — Z79899 Other long term (current) drug therapy: Secondary | ICD-10-CM | POA: Insufficient documentation

## 2016-08-22 DIAGNOSIS — D649 Anemia, unspecified: Secondary | ICD-10-CM | POA: Diagnosis not present

## 2016-08-22 DIAGNOSIS — Z87891 Personal history of nicotine dependence: Secondary | ICD-10-CM | POA: Diagnosis not present

## 2016-08-22 DIAGNOSIS — L910 Hypertrophic scar: Secondary | ICD-10-CM | POA: Diagnosis present

## 2016-08-22 DIAGNOSIS — I739 Peripheral vascular disease, unspecified: Secondary | ICD-10-CM | POA: Diagnosis not present

## 2016-08-22 DIAGNOSIS — E119 Type 2 diabetes mellitus without complications: Secondary | ICD-10-CM | POA: Diagnosis not present

## 2016-08-22 DIAGNOSIS — I2510 Atherosclerotic heart disease of native coronary artery without angina pectoris: Secondary | ICD-10-CM | POA: Insufficient documentation

## 2016-08-22 HISTORY — PX: KENALOG INJECTION: SHX5298

## 2016-08-22 HISTORY — PX: MASS EXCISION: SHX2000

## 2016-08-22 LAB — GLUCOSE, CAPILLARY
GLUCOSE-CAPILLARY: 135 mg/dL — AB (ref 65–99)
GLUCOSE-CAPILLARY: 136 mg/dL — AB (ref 65–99)

## 2016-08-22 SURGERY — EXCISION MASS
Anesthesia: General | Site: Neck | Laterality: Right

## 2016-08-22 MED ORDER — LACTATED RINGERS IV SOLN
INTRAVENOUS | Status: DC
  Administered 2016-08-22: 08:00:00 via INTRAVENOUS

## 2016-08-22 MED ORDER — FENTANYL CITRATE (PF) 100 MCG/2ML IJ SOLN: 25.0000 ug | INTRAMUSCULAR | Status: DC | PRN

## 2016-08-22 MED ORDER — ONDANSETRON HCL 4 MG/2ML IJ SOLN
INTRAMUSCULAR | Status: DC | PRN
  Administered 2016-08-22: 4 mg via INTRAVENOUS

## 2016-08-22 MED ORDER — CEFAZOLIN SODIUM-DEXTROSE 2-4 GM/100ML-% IV SOLN
INTRAVENOUS | Status: AC
  Filled 2016-08-22: qty 100

## 2016-08-22 MED ORDER — SODIUM CHLORIDE 0.9 % IV SOLN: 250.0000 mL | INTRAVENOUS | Status: DC | PRN

## 2016-08-22 MED ORDER — TRIAMCINOLONE ACETONIDE 40 MG/ML IJ SUSP
INTRAMUSCULAR | Status: DC | PRN
  Administered 2016-08-22: 3 mL via INTRAMUSCULAR

## 2016-08-22 MED ORDER — FENTANYL CITRATE (PF) 100 MCG/2ML IJ SOLN
50.0000 ug | INTRAMUSCULAR | Status: DC | PRN
  Administered 2016-08-22: 50 ug via INTRAVENOUS

## 2016-08-22 MED ORDER — PHENYLEPHRINE HCL 10 MG/ML IJ SOLN
INTRAMUSCULAR | Status: DC | PRN
  Administered 2016-08-22: 80 ug via INTRAVENOUS

## 2016-08-22 MED ORDER — SCOPOLAMINE 1 MG/3DAYS TD PT72: 1.0000 | MEDICATED_PATCH | Freq: Once | TRANSDERMAL | Status: DC | PRN

## 2016-08-22 MED ORDER — FENTANYL CITRATE (PF) 100 MCG/2ML IJ SOLN
INTRAMUSCULAR | Status: AC
  Filled 2016-08-22: qty 2

## 2016-08-22 MED ORDER — OXYCODONE HCL 5 MG PO TABS: 5.0000 mg | ORAL_TABLET | ORAL | Status: DC | PRN

## 2016-08-22 MED ORDER — TRIAMCINOLONE ACETONIDE 40 MG/ML IJ SUSP
INTRAMUSCULAR | Status: AC
  Filled 2016-08-22: qty 5

## 2016-08-22 MED ORDER — MIDAZOLAM HCL 2 MG/2ML IJ SOLN
INTRAMUSCULAR | Status: AC
  Filled 2016-08-22: qty 2

## 2016-08-22 MED ORDER — LIDOCAINE HCL (CARDIAC) 20 MG/ML IV SOLN
INTRAVENOUS | Status: DC | PRN
  Administered 2016-08-22: 50 mg via INTRAVENOUS

## 2016-08-22 MED ORDER — EPHEDRINE SULFATE 50 MG/ML IJ SOLN
INTRAMUSCULAR | Status: DC | PRN
  Administered 2016-08-22: 15 mg via INTRAVENOUS

## 2016-08-22 MED ORDER — DEXAMETHASONE SODIUM PHOSPHATE 4 MG/ML IJ SOLN
INTRAMUSCULAR | Status: DC | PRN
  Administered 2016-08-22: 8 mg via INTRAVENOUS

## 2016-08-22 MED ORDER — CEFAZOLIN SODIUM-DEXTROSE 2-4 GM/100ML-% IV SOLN
2.0000 g | INTRAVENOUS | Status: AC
  Administered 2016-08-22: 2 g via INTRAVENOUS

## 2016-08-22 MED ORDER — MIDAZOLAM HCL 2 MG/2ML IJ SOLN: 1.0000 mg | INTRAMUSCULAR | Status: DC | PRN

## 2016-08-22 MED ORDER — ACETAMINOPHEN 650 MG RE SUPP: 650.0000 mg | RECTAL | Status: DC | PRN

## 2016-08-22 MED ORDER — PROPOFOL 10 MG/ML IV BOLUS
INTRAVENOUS | Status: DC | PRN
  Administered 2016-08-22: 150 mg via INTRAVENOUS

## 2016-08-22 MED ORDER — LIDOCAINE-EPINEPHRINE 1 %-1:100000 IJ SOLN
INTRAMUSCULAR | Status: DC | PRN
  Administered 2016-08-22: 4 mL

## 2016-08-22 MED ORDER — SODIUM CHLORIDE 0.9% FLUSH: 3.0000 mL | Freq: Two times a day (BID) | INTRAVENOUS | Status: DC

## 2016-08-22 MED ORDER — ACETAMINOPHEN 325 MG PO TABS: 650.0000 mg | ORAL_TABLET | ORAL | Status: DC | PRN

## 2016-08-22 MED ORDER — SODIUM CHLORIDE 0.9% FLUSH: 3.0000 mL | INTRAVENOUS | Status: DC | PRN

## 2016-08-22 SURGICAL SUPPLY — 68 items
ADH SKN CLS APL DERMABOND .7 (GAUZE/BANDAGES/DRESSINGS) ×2
APL SKNCLS STERI-STRIP NONHPOA (GAUZE/BANDAGES/DRESSINGS)
BENZOIN TINCTURE PRP APPL 2/3 (GAUZE/BANDAGES/DRESSINGS) IMPLANT
BLADE CLIPPER SURG (BLADE) IMPLANT
BLADE SURG 15 STRL LF DISP TIS (BLADE) ×2 IMPLANT
BLADE SURG 15 STRL SS (BLADE) ×3
BNDG CONFORM 2 STRL LF (GAUZE/BANDAGES/DRESSINGS) IMPLANT
BNDG ELASTIC 2X5.8 VLCR STR LF (GAUZE/BANDAGES/DRESSINGS) IMPLANT
CANISTER SUCT 1200ML W/VALVE (MISCELLANEOUS) IMPLANT
CHLORAPREP W/TINT 26ML (MISCELLANEOUS) ×1 IMPLANT
CLEANER CAUTERY TIP 5X5 PAD (MISCELLANEOUS) IMPLANT
CORD BIPOLAR FORCEPS 12FT (ELECTRODE) IMPLANT
COVER BACK TABLE 60X90IN (DRAPES) ×3 IMPLANT
COVER MAYO STAND STRL (DRAPES) ×3 IMPLANT
DECANTER SPIKE VIAL GLASS SM (MISCELLANEOUS) IMPLANT
DERMABOND ADVANCED (GAUZE/BANDAGES/DRESSINGS) ×1
DERMABOND ADVANCED .7 DNX12 (GAUZE/BANDAGES/DRESSINGS) IMPLANT
DRAPE LAPAROTOMY 100X72 PEDS (DRAPES) IMPLANT
DRAPE U-SHAPE 76X120 STRL (DRAPES) ×3 IMPLANT
DRSG TEGADERM 2-3/8X2-3/4 SM (GAUZE/BANDAGES/DRESSINGS) IMPLANT
DRSG TEGADERM 4X4.75 (GAUZE/BANDAGES/DRESSINGS) IMPLANT
ELECT COATED BLADE 2.86 ST (ELECTRODE) IMPLANT
ELECT NDL BLADE 2-5/6 (NEEDLE) IMPLANT
ELECT NEEDLE BLADE 2-5/6 (NEEDLE) ×3 IMPLANT
ELECT REM PT RETURN 9FT ADLT (ELECTROSURGICAL) ×3
ELECT REM PT RETURN 9FT PED (ELECTROSURGICAL)
ELECTRODE REM PT RETRN 9FT PED (ELECTROSURGICAL) IMPLANT
ELECTRODE REM PT RTRN 9FT ADLT (ELECTROSURGICAL) IMPLANT
GAUZE SPONGE 4X4 12PLY STRL LF (GAUZE/BANDAGES/DRESSINGS) IMPLANT
GLOVE BIO SURGEON STRL SZ 6.5 (GLOVE) ×6 IMPLANT
GLOVE BIO SURGEON STRL SZ7 (GLOVE) ×1 IMPLANT
GOWN STRL REUS W/ TWL LRG LVL3 (GOWN DISPOSABLE) ×4 IMPLANT
GOWN STRL REUS W/TWL LRG LVL3 (GOWN DISPOSABLE) ×9
NDL HYPO 25X1 1.5 SAFETY (NEEDLE) IMPLANT
NDL HYPO 30GX1 BEV (NEEDLE) IMPLANT
NDL PRECISIONGLIDE 27X1.5 (NEEDLE) ×2 IMPLANT
NEEDLE HYPO 25X1 1.5 SAFETY (NEEDLE) ×3 IMPLANT
NEEDLE HYPO 30GX1 BEV (NEEDLE) ×3 IMPLANT
NEEDLE PRECISIONGLIDE 27X1.5 (NEEDLE) IMPLANT
NS IRRIG 1000ML POUR BTL (IV SOLUTION) IMPLANT
PACK BASIN DAY SURGERY FS (CUSTOM PROCEDURE TRAY) ×3 IMPLANT
PAD CLEANER CAUTERY TIP 5X5 (MISCELLANEOUS)
PENCIL BUTTON HOLSTER BLD 10FT (ELECTRODE) ×1 IMPLANT
RUBBERBAND STERILE (MISCELLANEOUS) IMPLANT
SHEET MEDIUM DRAPE 40X70 STRL (DRAPES) IMPLANT
SLEEVE SCD COMPRESS KNEE MED (MISCELLANEOUS) ×1 IMPLANT
SPONGE GAUZE 2X2 8PLY STRL LF (GAUZE/BANDAGES/DRESSINGS) IMPLANT
STRIP CLOSURE SKIN 1/2X4 (GAUZE/BANDAGES/DRESSINGS) IMPLANT
SUCTION FRAZIER HANDLE 10FR (MISCELLANEOUS)
SUCTION TUBE FRAZIER 10FR DISP (MISCELLANEOUS) IMPLANT
SUT MNCRL 6-0 UNDY P1 1X18 (SUTURE) IMPLANT
SUT MNCRL AB 3-0 PS2 18 (SUTURE) IMPLANT
SUT MNCRL AB 4-0 PS2 18 (SUTURE) ×1 IMPLANT
SUT MON AB 5-0 P3 18 (SUTURE) IMPLANT
SUT MON AB 5-0 PS2 18 (SUTURE) ×1 IMPLANT
SUT MONOCRYL 6-0 P1 1X18 (SUTURE)
SUT PROLENE 5 0 P 3 (SUTURE) IMPLANT
SUT PROLENE 5 0 PS 2 (SUTURE) IMPLANT
SUT PROLENE 6 0 P 1 18 (SUTURE) IMPLANT
SUT VIC AB 5-0 P-3 18X BRD (SUTURE) IMPLANT
SUT VIC AB 5-0 P3 18 (SUTURE)
SUT VIC AB 5-0 PS2 18 (SUTURE) IMPLANT
SUT VICRYL 4-0 PS2 18IN ABS (SUTURE) IMPLANT
SYR BULB 3OZ (MISCELLANEOUS) IMPLANT
SYR CONTROL 10ML LL (SYRINGE) ×3 IMPLANT
TOWEL OR 17X24 6PK STRL BLUE (TOWEL DISPOSABLE) ×3 IMPLANT
TRAY DSU PREP LF (CUSTOM PROCEDURE TRAY) ×2 IMPLANT
TUBE CONNECTING 20X1/4 (TUBING) IMPLANT

## 2016-08-22 NOTE — Anesthesia Preprocedure Evaluation (Signed)
Anesthesia Evaluation  Patient identified by MRN, date of birth, ID band Patient awake    Reviewed: Allergy & Precautions, NPO status , reviewed documented beta blocker date and time   Airway Mallampati: II  TM Distance: >3 FB Neck ROM: Full    Dental  (+) Dental Advisory Given   Pulmonary former smoker,    breath sounds clear to auscultation       Cardiovascular hypertension, Pt. on medications and Pt. on home beta blockers + CAD, + Past MI, + Cardiac Stents and + Peripheral Vascular Disease   Rhythm:Regular Rate:Normal     Neuro/Psych Anxiety Depression  Neuromuscular disease CVA    GI/Hepatic GERD  ,  Endo/Other  diabetes, Type 2, Oral Hypoglycemic Agents  Renal/GU      Musculoskeletal  (+) Arthritis ,   Abdominal   Peds  Hematology  (+) anemia ,   Anesthesia Other Findings Infection/ hematoma R neck postop  Reproductive/Obstetrics                             Anesthesia Physical  Anesthesia Plan  ASA: III  Anesthesia Plan: General   Post-op Pain Management:    Induction: Intravenous  PONV Risk Score and Plan: 3 and Ondansetron, Dexamethasone, Propofol and Treatment may vary due to age or medical condition  Airway Management Planned: Oral ETT and LMA  Additional Equipment: None  Intra-op Plan:   Post-operative Plan: Extubation in OR  Informed Consent: I have reviewed the patients History and Physical, chart, labs and discussed the procedure including the risks, benefits and alternatives for the proposed anesthesia with the patient or authorized representative who has indicated his/her understanding and acceptance.     Plan Discussed with: CRNA, Anesthesiologist and Surgeon  Anesthesia Plan Comments:         Anesthesia Quick Evaluation

## 2016-08-22 NOTE — Anesthesia Postprocedure Evaluation (Signed)
Anesthesia Post Note  Patient: Charnette Younkin  Procedure(s) Performed: Procedure(s) (LRB): EXCISION RIGHT NECK KELOID (Right) KENALOG INJECTION BILATERAL NECK (Bilateral)     Patient location during evaluation: PACU Anesthesia Type: General Level of consciousness: awake and alert Pain management: pain level controlled Vital Signs Assessment: post-procedure vital signs reviewed and stable Respiratory status: spontaneous breathing, nonlabored ventilation, respiratory function stable and patient connected to nasal cannula oxygen Cardiovascular status: blood pressure returned to baseline and stable Postop Assessment: no signs of nausea or vomiting Anesthetic complications: no    Last Vitals:  Vitals:   08/22/16 0930 08/22/16 1010  BP: (!) 122/57 134/65  Pulse: 76 72  Resp: 13 18  Temp:  36.4 C    Last Pain:  Vitals:   08/22/16 1010  TempSrc:   PainSc: 0-No pain                 Tiajuana Amass

## 2016-08-22 NOTE — H&P (Signed)
Joanne Morris is an 66 y.o. female.   Chief Complaint: keloids HPI: The patient is a 66 y.o. yrs old bf here for bilateral neck keloids.  She underwent bilateral carotic endarterectomies.  Within a year she developed keloids of the scars.  She was sent by Dr. Scot Dock for evaluation of the areas.  She is starting to notice them getting worse and tight.  The inferior portion of the scars is spared for 1-2 cm.  There was no history given of infection or difficulty healing.  The length of the keloids is ~ 6 cm with a thick area of the keloid.  She was scheduled for surgery several months ago and cancelled.  She is now back and wants to undergo the surgery.  There is no sign of infection of the area.  Past Medical History:  Diagnosis Date  . Angina   . Anxiety   . Arthritis    "knees" (07/20/2013)  . Carotid stenosis   . Carotid stenosis   . Claudication ([place])   . Coronary artery disease   . Depression   . GERD (gastroesophageal reflux disease)   . High cholesterol   . Hypertension   . Myocardial infarction ([place]) 1990's   "1"  . PAD (peripheral artery disease) ([place])   . Stroke [facility])    "mini stroke 1st then regular stroke", denies residual on 07/20/2013  . Type II diabetes mellitus ([place])   . Wears glasses     Past Surgical History:  Procedure Laterality Date  . ABDOMINAL HYSTERECTOMY    . ATHERECTOMY  10/05/2013  . BALLOON ANGIOPLASTY, ARTERY  10/05/2013   DR [name]  . CAROTID ENDARTERECTOMY    . CORONARY ANGIOPLASTY WITH STENT PLACEMENT     "1"  . ENDARTERECTOMY Left 02/07/2014   Procedure: ENDARTERECTOMY CAROTID;  Surgeon: Elam Dutch, MD;  Location: [place];  Service: Vascular;  Laterality: Left;  . ENDARTERECTOMY Right 04/18/2014   Procedure: ENDARTERECTOMY RIGHT CAROTID;  Surgeon: Elam Dutch, MD;  Location: [place];  Service: Vascular;  Laterality: Right;  . ENDARTERECTOMY N/A 04/24/2014   Procedure: IRRIGATION AND DEBRIDEMENT OF RIGHT NECK ;  Surgeon: Elam Dutch, MD;   Location: [place];  Service: Vascular;  Laterality: N/A;  . ESOPHAGOGASTRODUODENOSCOPY N/A 10/13/2012   Procedure: ESOPHAGOGASTRODUODENOSCOPY (EGD);  Surgeon: Gatha Mayer, MD;  Location: [facility] ENDOSCOPY;  Service: Endoscopy;  Laterality: N/A;  . LOWER EXTREMITY ANGIOGRAM  07/20/2013   Unsuccessful attempt at crossing the CTO/notes 07/20/2013  . LOWER EXTREMITY ANGIOGRAM N/A 07/06/2013   Procedure: LOWER EXTREMITY ANGIOGRAM;  Surgeon: Laverda Page, MD;  Location: [facility] CATH LAB;  Service: Cardiovascular;  Laterality: N/A;  . LOWER EXTREMITY ANGIOGRAM N/A 07/20/2013   Procedure: LOWER EXTREMITY ANGIOGRAM;  Surgeon: Laverda Page, MD;  Location: [facility] CATH LAB;  Service: Cardiovascular;  Laterality: N/A;  . LOWER EXTREMITY ANGIOGRAM N/A 10/05/2013   Procedure: LOWER EXTREMITY ANGIOGRAM;  Surgeon: Laverda Page, MD;  Location: [facility] CATH LAB;  Service: Cardiovascular;  Laterality: N/A;  . PATCH ANGIOPLASTY Left 02/07/2014   Procedure: PATCH ANGIOPLASTY Carotid;  Surgeon: Elam Dutch, MD;  Location: [place];  Service: Vascular;  Laterality: Left;  . PATCH ANGIOPLASTY Right 04/18/2014   Procedure: PATCH ANGIOPLASTY USING HEMASHIELD 0.8cmx 7.6cm PATCH;  Surgeon: Elam Dutch, MD;  Location: [place];  Service: Vascular;  Laterality: Right;  . PERIPHERAL VASCULAR CATHETERIZATION N/A 07/05/2014   Procedure: Lower Extremity Angiography;  Surgeon: [name], MD;  Location: [place] CV LAB;  Service: Cardiovascular;  Laterality: N/A;  . TUBAL LIGATION      History reviewed. No pertinent family history. Social History:  reports that she quit smoking about 7 years ago. Her smoking use included Cigarettes. She has a 2.00 pack-year smoking history. She has never used smokeless tobacco. She reports that she does not drink alcohol or use drugs.  Allergies:  Allergies  Allergen Reactions  . No Known Allergies     Medications Prior to Admission  Medication Sig Dispense Refill  . ALPRAZolam (XANAX) 0.25 MG  tablet Take 0.25 mg by mouth daily as needed for anxiety.    Marland Kitchen amLODipine (NORVASC) 10 MG tablet Take 10 mg by mouth daily.  5  . aspirin EC 81 MG tablet Take 81 mg by mouth daily.    . hydrALAZINE (APRESOLINE) 50 MG tablet Take 50 mg by mouth 3 (three) times daily.  0  . HYDROcodone-acetaminophen (NORCO/VICODIN) 5-325 MG per tablet Take 1 tablet by mouth every 6 (six) hours as needed for moderate pain. 30 tablet 0  . meloxicam (MOBIC) 7.5 MG tablet Take 7.5 mg by mouth daily as needed for pain.     . metFORMIN (GLUCOPHAGE) 500 MG tablet Take 1 tablet (500 mg total) by mouth daily with breakfast.    . metoprolol (LOPRESSOR) 100 MG tablet Take 100 mg by mouth 2 (two) times daily.    . niacin (NIASPAN) 1000 MG CR tablet Take 1,000 mg by mouth daily.   6  . omeprazole (PRILOSEC) 20 MG capsule Take 20 mg by mouth daily.    . pravastatin (PRAVACHOL) 40 MG tablet Take 40 mg by mouth daily.    Marland Kitchen tiZANidine (ZANAFLEX) 4 MG tablet Take 4 mg by mouth at bedtime as needed for muscle spasms.   2  . valsartan-hydrochlorothiazide (DIOVAN-HCT) 320-25 MG per tablet Take 1 tablet by mouth daily.   2  . zolpidem (AMBIEN) 10 MG tablet Take 10 mg by mouth at bedtime.   2  . ferrous sulfate 325 (65 FE) MG EC tablet Take 325 mg by mouth daily with breakfast.    . nitroGLYCERIN (NITROSTAT) 0.4 MG SL tablet Place 0.4 mg under the tongue every 5 (five) minutes as needed for chest pain.       No results found for this or any previous visit (from the past 48 hour(s)). No results found.  Review of Systems  Constitutional: Negative.   HENT: Negative.   Eyes: Negative.   Respiratory: Negative.   Cardiovascular: Negative.   Gastrointestinal: Negative.   Genitourinary: Negative.   Musculoskeletal: Negative.   Skin: Negative.   Neurological: Negative.   Psychiatric/Behavioral: Negative.     Height 5' 1.5" (1.562 m), weight 59.4 kg (131 lb). Physical Exam  Constitutional: She is oriented to person, place, and  time. She appears well-developed and well-nourished.  HENT:  Head: Normocephalic and atraumatic.  Eyes: Pupils are equal, round, and reactive to light. EOM are normal.  Cardiovascular: Normal rate.   Respiratory: Effort normal and breath sounds normal.  GI: Soft.  Musculoskeletal: Normal range of motion.  Neurological: She is alert and oriented to person, place, and time.  Skin: Skin is warm. No rash noted. No erythema. No pallor.  Psychiatric: She has a normal mood and affect. Her behavior is normal. Judgment and thought content normal.     Assessment/Plan Recommend staged procedure as we discussed last visit.  Excision of the keloids and kenalog injection.  She will need to get silicone sheets for postoperative.  We will plan a kenalog injection one month later.  Patient given the information for the silicone again.  Wallace Going, DO 08/22/2016, 7:11 AM

## 2016-08-22 NOTE — Interval H&P Note (Signed)
History and Physical Interval Note:  08/22/2016 8:18 AM  Joanne Morris  has presented today for surgery, with the diagnosis of BILATERAL NECK KELOID  The various methods of treatment have been discussed with the patient and family. After consideration of risks, benefits and other options for treatment, the patient has consented to  Procedure(s): EXCISION BILATERAL NECK KELOIDS (Bilateral) KENALOG INJECTION (Bilateral) as a surgical intervention .  The patient's history has been reviewed, patient examined, no change in status, stable for surgery.  I have reviewed the patient's chart and labs.  Questions were answered to the patient's satisfaction.     Wallace Going

## 2016-08-22 NOTE — Op Note (Signed)
DATE OF OPERATION: 08/22/2016  LOCATION: [facility]  PREOPERATIVE DIAGNOSIS: Right neck keloid  POSTOPERATIVE DIAGNOSIS: Same  PROCEDURE: Excision of right neck keloid 2 x 7 cm with primary closure  SURGEON: Claire Sanger Dillingham, DO  ASSISTANT: Shawn Rayburn, PA  EBL: 5 cc  CONDITION: Stable  COMPLICATIONS: None  INDICATION: The patient, Joanne Morris, is a 66 y.o. female born on 07-Oct-1950, is here for treatment of a keloid of the right neck that occurred after her carotidendarterectomy.   PROCEDURE DETAILS:  The patient was seen prior to surgery and marked.  The IV antibiotics were given. The patient was taken to the operating room and given a general anesthetic. A standard time out was performed and all information was confirmed by those in the room. SCDs were placed.  The local was injected in the subcutaneous area.  After waiting for the local to take effect the #15 blade was used to make an incision and excise the 2 x 7 cm keloid.  The bovie was used to obtain hemostasis.  The skin was undermined for 1 - 2 cm.  The deep layers were closed with 4-0 Monocryl.  The skin was closed with a running 5-0 Monocryl.  Dermabond and steri strips were applied.  The specimen was sent to pathology. The patient was allowed to wake up and taken to recovery room in stable condition at the end of the case. The family was notified at the end of the case.

## 2016-08-22 NOTE — Transfer of Care (Signed)
Immediate Anesthesia Transfer of Care Note  Patient: Joanne Morris  Procedure(s) Performed: Procedure(s): EXCISION RIGHT NECK KELOID (Right) KENALOG INJECTION BILATERAL NECK (Bilateral)  Patient Location: PACU  Anesthesia Type:General  Level of Consciousness: awake and drowsy  Airway & Oxygen Therapy: Patient Spontanous Breathing and Patient connected to face mask oxygen  Post-op Assessment: Report given to RN and Post -op Vital signs reviewed and stable  Post vital signs: Reviewed and stable  Last Vitals:  Vitals:   08/22/16 0748  BP: 134/64  Pulse: 70  Resp: 20  Temp: 36.9 C    Last Pain:  Vitals:   08/22/16 0748  TempSrc: Oral         Complications: No apparent anesthesia complications

## 2016-08-22 NOTE — Discharge Instructions (Signed)
May shower in two days Keep steri strips in place   Post Anesthesia Home Care Instructions  Activity: Get plenty of rest for the remainder of the day. A responsible individual must stay with you for 24 hours following the procedure.  For the next 24 hours, DO NOT: -Drive a car -Paediatric nurse -Drink alcoholic beverages -Take any medication unless instructed by your physician -Make any legal decisions or sign important papers.  Meals: Start with liquid foods such as gelatin or soup. Progress to regular foods as tolerated. Avoid greasy, spicy, heavy foods. If nausea and/or vomiting occur, drink only clear liquids until the nausea and/or vomiting subsides. Call your physician if vomiting continues.  Special Instructions/Symptoms: Your throat may feel dry or sore from the anesthesia or the breathing tube placed in your throat during surgery. If this causes discomfort, gargle with warm salt water. The discomfort should disappear within 24 hours.  If you had a scopolamine patch placed behind your ear for the management of post- operative nausea and/or vomiting:  1. The medication in the patch is effective for 72 hours, after which it should be removed.  Wrap patch in a tissue and discard in the trash. Wash hands thoroughly with soap and water. 2. You may remove the patch earlier than 72 hours if you experience unpleasant side effects which may include dry mouth, dizziness or visual disturbances. 3. Avoid touching the patch. Wash your hands with soap and water after contact with the patch.

## 2016-08-22 NOTE — Anesthesia Procedure Notes (Signed)
Procedure Name: LMA Insertion Date/Time: 08/22/2016 8:36 AM Performed by: Melynda Ripple D Pre-anesthesia Checklist: Patient identified, Emergency Drugs available, Suction available and Patient being monitored Patient Re-evaluated:Patient Re-evaluated prior to induction Oxygen Delivery Method: Circle system utilized Preoxygenation: Pre-oxygenation with 100% oxygen Induction Type: IV induction Ventilation: Mask ventilation without difficulty LMA: LMA inserted LMA Size: 4.0 Number of attempts: 1 Airway Equipment and Method: Bite block Placement Confirmation: positive ETCO2 Tube secured with: Tape Dental Injury: Teeth and Oropharynx as per pre-operative assessment

## 2016-08-23 ENCOUNTER — Encounter (HOSPITAL_BASED_OUTPATIENT_CLINIC_OR_DEPARTMENT_OTHER): Payer: Self-pay | Admitting: Plastic Surgery

## 2017-04-03 ENCOUNTER — Ambulatory Visit: Payer: Medicare Other | Admitting: Family

## 2017-04-03 ENCOUNTER — Encounter (HOSPITAL_COMMUNITY): Payer: Medicare Other

## 2017-06-09 ENCOUNTER — Encounter (HOSPITAL_COMMUNITY): Payer: Medicare Other

## 2017-06-09 ENCOUNTER — Ambulatory Visit: Payer: Medicare Other | Admitting: Family

## 2017-08-11 ENCOUNTER — Ambulatory Visit: Payer: Medicare Other | Admitting: Family

## 2017-08-11 ENCOUNTER — Encounter (HOSPITAL_COMMUNITY): Payer: Medicare Other

## 2017-09-01 ENCOUNTER — Other Ambulatory Visit: Payer: Self-pay

## 2017-09-01 DIAGNOSIS — I6523 Occlusion and stenosis of bilateral carotid arteries: Secondary | ICD-10-CM

## 2017-09-19 ENCOUNTER — Ambulatory Visit (HOSPITAL_COMMUNITY): Payer: Medicare Other

## 2017-09-19 ENCOUNTER — Ambulatory Visit: Payer: Medicare Other | Admitting: Family

## 2017-11-03 ENCOUNTER — Ambulatory Visit: Payer: Medicare Other | Admitting: Family

## 2017-11-03 ENCOUNTER — Ambulatory Visit (HOSPITAL_COMMUNITY): Payer: Medicare Other | Attending: Vascular Surgery

## 2017-11-06 ENCOUNTER — Other Ambulatory Visit: Payer: Self-pay

## 2017-11-06 ENCOUNTER — Encounter (HOSPITAL_COMMUNITY): Payer: Self-pay | Admitting: Emergency Medicine

## 2017-11-06 ENCOUNTER — Emergency Department (HOSPITAL_COMMUNITY)
Admission: EM | Admit: 2017-11-06 | Discharge: 2017-11-07 | Disposition: A | Discharge status: Home / Self Care | Payer: Medicare Other | Attending: Emergency Medicine | Admitting: Emergency Medicine

## 2017-11-06 DIAGNOSIS — Z955 Presence of coronary angioplasty implant and graft: Secondary | ICD-10-CM | POA: Insufficient documentation

## 2017-11-06 DIAGNOSIS — F419 Anxiety disorder, unspecified: Secondary | ICD-10-CM | POA: Diagnosis not present

## 2017-11-06 DIAGNOSIS — I252 Old myocardial infarction: Secondary | ICD-10-CM | POA: Diagnosis not present

## 2017-11-06 DIAGNOSIS — Z87891 Personal history of nicotine dependence: Secondary | ICD-10-CM | POA: Diagnosis not present

## 2017-11-06 DIAGNOSIS — E119 Type 2 diabetes mellitus without complications: Secondary | ICD-10-CM | POA: Diagnosis not present

## 2017-11-06 DIAGNOSIS — Z7982 Long term (current) use of aspirin: Secondary | ICD-10-CM | POA: Diagnosis not present

## 2017-11-06 DIAGNOSIS — I10 Essential (primary) hypertension: Secondary | ICD-10-CM | POA: Insufficient documentation

## 2017-11-06 DIAGNOSIS — I2510 Atherosclerotic heart disease of native coronary artery without angina pectoris: Secondary | ICD-10-CM | POA: Insufficient documentation

## 2017-11-06 DIAGNOSIS — M5431 Sciatica, right side: Secondary | ICD-10-CM | POA: Diagnosis not present

## 2017-11-06 DIAGNOSIS — F329 Major depressive disorder, single episode, unspecified: Secondary | ICD-10-CM | POA: Insufficient documentation

## 2017-11-06 DIAGNOSIS — M25551 Pain in right hip: Secondary | ICD-10-CM | POA: Diagnosis present

## 2017-11-06 DIAGNOSIS — Z7984 Long term (current) use of oral hypoglycemic drugs: Secondary | ICD-10-CM | POA: Diagnosis not present

## 2017-11-06 DIAGNOSIS — Z79899 Other long term (current) drug therapy: Secondary | ICD-10-CM | POA: Insufficient documentation

## 2017-11-06 LAB — CBC WITH DIFFERENTIAL/PLATELET
Abs Immature Granulocytes: 0 10*3/uL (ref 0.0–0.1)
Basophils Absolute: 0 10*3/uL (ref 0.0–0.1)
Basophils Relative: 1 %
EOS PCT: 2 %
Eosinophils Absolute: 0.1 10*3/uL (ref 0.0–0.7)
HEMATOCRIT: 31.3 % — AB (ref 36.0–46.0)
HEMOGLOBIN: 9.7 g/dL — AB (ref 12.0–15.0)
Immature Granulocytes: 1 %
LYMPHS ABS: 3.2 10*3/uL (ref 0.7–4.0)
LYMPHS PCT: 38 %
MCH: 24.8 pg — AB (ref 26.0–34.0)
MCHC: 31 g/dL (ref 30.0–36.0)
MCV: 80.1 fL (ref 78.0–100.0)
MONO ABS: 0.7 10*3/uL (ref 0.1–1.0)
Monocytes Relative: 9 %
Neutro Abs: 4.2 10*3/uL (ref 1.7–7.7)
Neutrophils Relative %: 49 %
Platelets: 304 10*3/uL (ref 150–400)
RBC: 3.91 MIL/uL (ref 3.87–5.11)
RDW: 15.9 % — ABNORMAL HIGH (ref 11.5–15.5)
WBC: 8.3 10*3/uL (ref 4.0–10.5)

## 2017-11-06 LAB — BASIC METABOLIC PANEL
Anion gap: 13 (ref 5–15)
BUN: 33 mg/dL — ABNORMAL HIGH (ref 8–23)
CHLORIDE: 103 mmol/L (ref 98–111)
CO2: 25 mmol/L (ref 22–32)
CREATININE: 2.45 mg/dL — AB (ref 0.44–1.00)
Calcium: 9.7 mg/dL (ref 8.9–10.3)
GFR calc non Af Amer: 19 mL/min — ABNORMAL LOW (ref >60)
GFR, EST AFRICAN AMERICAN: 22 mL/min — AB (ref >60)
Glucose, Bld: 116 mg/dL — ABNORMAL HIGH (ref 70–99)
Potassium: 4.6 mmol/L (ref 3.5–5.1)
Sodium: 141 mmol/L (ref 135–145)

## 2017-11-06 LAB — CBG MONITORING, ED: Glucose-Capillary: 115 mg/dL — ABNORMAL HIGH (ref 70–99)

## 2017-11-06 LAB — I-STAT TROPONIN, ED: Troponin i, poc: 0 ng/mL (ref 0.00–0.08)

## 2017-11-06 NOTE — ED Triage Notes (Signed)
Patient reports right shoulder pain / right lateral neck pain for >1 week with fatigue and generalized body aches and legs pain . Denies injury or fall .

## 2017-11-07 ENCOUNTER — Emergency Department (HOSPITAL_COMMUNITY): Payer: Medicare Other

## 2017-11-07 DIAGNOSIS — M5431 Sciatica, right side: Secondary | ICD-10-CM | POA: Diagnosis not present

## 2017-11-07 MED ORDER — PREDNISONE 10 MG PO TABS: 20.0000 mg | ORAL_TABLET | Freq: Two times a day (BID) | ORAL | 0 refills | Status: DC

## 2017-11-07 MED ORDER — OXYCODONE-ACETAMINOPHEN 5-325 MG PO TABS
2.0000 | ORAL_TABLET | Freq: Once | ORAL | Status: DC
  Filled 2017-11-07: qty 2

## 2017-11-07 MED ORDER — HYDROCODONE-ACETAMINOPHEN 5-325 MG PO TABS: 1.0000 | ORAL_TABLET | Freq: Four times a day (QID) | ORAL | 0 refills | Status: DC | PRN

## 2017-11-07 NOTE — ED Notes (Signed)
PT states understanding of care given, follow up care, and medication prescribed. PT ambulated from ED to car with a steady gait. 

## 2017-11-07 NOTE — Discharge Instructions (Addendum)
Prednisone as prescribed.  Hydrocodone as prescribed as needed for pain.  Follow up with your primary doctor if not improving in the next week, or if symptoms worsen or changed.

## 2017-11-07 NOTE — ED Notes (Signed)
Pt states the pain starts in the hip and goes down both legs as if they are cramping

## 2017-11-07 NOTE — ED Provider Notes (Signed)
[facility] EMERGENCY DEPARTMENT Provider Note   CSN: [id number] Arrival date & time: 11/06/17  1807     History   Chief Complaint Chief Complaint  Patient presents with  . Shoulder Pain / Body Aches    Fatigue    HPI Joanne Morris is a 67 y.o. female.  Patient is a 67 year old female with past medical history of peripheral artery disease status post carotid endarterectomy in the past, diabetes, arthritis.  She presents today with complaints of pain in her right hip and buttock radiating down her leg.  This is been ongoing for the past week and began in the absence of any injury or trauma.  Her pain is worse when she attempts to ambulate and is relieved with rest.  She denies any bowel or bladder complaints.  She denies any weakness of the extremities.  She does report generalized aches and pains, most notably in her shoulder.  The history is provided by the patient.    Past Medical History:  Diagnosis Date  . Angina   . Anxiety   . Arthritis    "knees" (07/20/2013)  . Carotid stenosis   . Carotid stenosis   . Claudication ([place])   . Coronary artery disease   . Depression   . GERD (gastroesophageal reflux disease)   . High cholesterol   . Hypertension   . Myocardial infarction ([place]) 1990's   "1"  . PAD (peripheral artery disease) ([place])   . Stroke [facility])    "mini stroke 1st then regular stroke", denies residual on 07/20/2013  . Type II diabetes mellitus ([place])   . Wears glasses     Patient Active Problem List   Diagnosis Date Noted  . Carotid stenosis 11/24/2014  . Neck pain on right side 10/04/2014  . Hematoma of neck 04/23/2014  . Carotid stenosis, asymptomatic 04/18/2014  . Carotid artery stenosis, asymptomatic 02/07/2014  . PAD (peripheral artery disease) ([place]) 07/20/2013  . Peripheral arterial disease ([place]) 06/08/2013  . Abdominal pain 10/11/2012  . Dyslipidemia 10/11/2012  . Diabetes mellitus ([place]) 10/11/2012  . Depression 10/11/2012  .  Radiculopathy of leg 10/11/2012  . Atypical chest pain 05/31/2011  . CAD (coronary artery disease) 05/31/2011  . Hypokalemia 05/31/2011  . HTN (hypertension) 05/31/2011  . Anemia 05/31/2011  . Leucocytosis 05/31/2011  . DM2 (diabetes mellitus, type 2) ([place]) 05/31/2011  . Hyperlipemia 05/31/2011    Past Surgical History:  Procedure Laterality Date  . ABDOMINAL HYSTERECTOMY    . ATHERECTOMY  10/05/2013  . BALLOON ANGIOPLASTY, ARTERY  10/05/2013   DR [name]  . CAROTID ENDARTERECTOMY    . CORONARY ANGIOPLASTY WITH STENT PLACEMENT     "1"  . ENDARTERECTOMY Left 02/07/2014   Procedure: ENDARTERECTOMY CAROTID;  Surgeon: Elam Dutch, MD;  Location: [place];  Service: Vascular;  Laterality: Left;  . ENDARTERECTOMY Right 04/18/2014   Procedure: ENDARTERECTOMY RIGHT CAROTID;  Surgeon: Elam Dutch, MD;  Location: [place];  Service: Vascular;  Laterality: Right;  . ENDARTERECTOMY N/A 04/24/2014   Procedure: IRRIGATION AND DEBRIDEMENT OF RIGHT NECK ;  Surgeon: Elam Dutch, MD;  Location: [place];  Service: Vascular;  Laterality: N/A;  . ESOPHAGOGASTRODUODENOSCOPY N/A 10/13/2012   Procedure: ESOPHAGOGASTRODUODENOSCOPY (EGD);  Surgeon: Gatha Mayer, MD;  Location: [facility] ENDOSCOPY;  Service: Endoscopy;  Laterality: N/A;  . KENALOG INJECTION Bilateral 08/22/2016   Procedure: KENALOG INJECTION BILATERAL NECK;  Surgeon: Wallace Going, DO;  Location: [place];  Service: Plastics;  Laterality: Bilateral;  .  LOWER EXTREMITY ANGIOGRAM  07/20/2013   Unsuccessful attempt at crossing the CTO/notes 07/20/2013  . LOWER EXTREMITY ANGIOGRAM N/A 07/06/2013   Procedure: LOWER EXTREMITY ANGIOGRAM;  Surgeon: Laverda Page, MD;  Location: [facility] CATH LAB;  Service: Cardiovascular;  Laterality: N/A;  . LOWER EXTREMITY ANGIOGRAM N/A 07/20/2013   Procedure: LOWER EXTREMITY ANGIOGRAM;  Surgeon: Laverda Page, MD;  Location: [facility] CATH LAB;  Service: Cardiovascular;  Laterality: N/A;  . LOWER EXTREMITY  ANGIOGRAM N/A 10/05/2013   Procedure: LOWER EXTREMITY ANGIOGRAM;  Surgeon: Laverda Page, MD;  Location: [facility] CATH LAB;  Service: Cardiovascular;  Laterality: N/A;  . MASS EXCISION Right 08/22/2016   Procedure: EXCISION RIGHT NECK KELOID;  Surgeon: Wallace Going, DO;  Location: [place];  Service: Plastics;  Laterality: Right;  . PATCH ANGIOPLASTY Left 02/07/2014   Procedure: PATCH ANGIOPLASTY Carotid;  Surgeon: Elam Dutch, MD;  Location: [place];  Service: Vascular;  Laterality: Left;  . PATCH ANGIOPLASTY Right 04/18/2014   Procedure: PATCH ANGIOPLASTY USING HEMASHIELD 0.8cmx 7.6cm PATCH;  Surgeon: Elam Dutch, MD;  Location: [place];  Service: Vascular;  Laterality: Right;  . PERIPHERAL VASCULAR CATHETERIZATION N/A 07/05/2014   Procedure: Lower Extremity Angiography;  Surgeon: [name], MD;  Location: [place] CV LAB;  Service: Cardiovascular;  Laterality: N/A;  . TUBAL LIGATION       OB History   None      Home Medications    Prior to Admission medications   Medication Sig Start Date End Date Taking? Authorizing Provider  ALPRAZolam [name]) 0.25 MG tablet Take 0.25 mg by mouth daily as needed for anxiety.    [provider]  amLODipine (NORVASC) 10 MG tablet Take 10 mg by mouth daily. 09/22/14   [provider]  aspirin EC 81 MG tablet Take 81 mg by mouth daily.    [provider]  ferrous sulfate 325 (65 FE) MG EC tablet Take 325 mg by mouth daily with breakfast.    [provider]  hydrALAZINE (APRESOLINE) 50 MG tablet Take 50 mg by mouth 3 (three) times daily. 09/22/14   [provider]  HYDROcodone-acetaminophen (NORCO/VICODIN) 5-325 MG per tablet Take 1 tablet by mouth every 6 (six) hours as needed for moderate pain. 04/19/14   Alvia Grove, PA-C  meloxicam (MOBIC) 7.5 MG tablet Take 7.5 mg by mouth daily as needed for pain.  05/14/13   [provider]  metFORMIN (GLUCOPHAGE) 500 MG tablet Take  1 tablet (500 mg total) by mouth daily with breakfast. 07/05/14   [name], MD  metoprolol (LOPRESSOR) 100 MG tablet Take 100 mg by mouth 2 (two) times daily. 03/06/16   [provider]  niacin (NIASPAN) 1000 MG CR tablet Take 1,000 mg by mouth daily.  12/21/13   [provider]  nitroGLYCERIN (NITROSTAT) 0.4 MG SL tablet Place 0.4 mg under the tongue every 5 (five) minutes as needed for chest pain.     [provider]  omeprazole (PRILOSEC) 20 MG capsule Take 20 mg by mouth daily.    [provider]  pravastatin (PRAVACHOL) 40 MG tablet Take 40 mg by mouth daily.    [provider]  tiZANidine (ZANAFLEX) 4 MG tablet Take 4 mg by mouth at bedtime as needed for muscle spasms.  03/11/14   [provider]  valsartan-hydrochlorothiazide (DIOVAN-HCT) 320-25 MG per tablet Take 1 tablet by mouth daily.  06/24/14   [provider]  zolpidem (AMBIEN) 10 MG tablet  Take 10 mg by mouth at bedtime.  06/17/14   [provider]    Family History No family history on file.  Social History Social History   Tobacco Use  . Smoking status: Former Smoker    Packs/day: 0.50    Years: 4.00    Pack years: 2.00    Types: Cigarettes    Last attempt to quit: 05/30/2009    Years since quitting: 8.4  . Smokeless tobacco: Never Used  Substance Use Topics  . Alcohol use: No    Alcohol/week: 0.0 standard drinks  . Drug use: No     Allergies   No known allergies   Review of Systems Review of Systems  All other systems reviewed and are negative.    Physical Exam Updated Vital Signs BP 135/62   Pulse 76   Temp 98.2 F (36.8 C) (Oral)   Resp 18   SpO2 100%   Physical Exam  Constitutional: She is oriented to person, place, and time. She appears well-developed and well-nourished. No distress.  HENT:  Head: Normocephalic and atraumatic.  Neck: Normal range of motion. Neck supple.  Cardiovascular: Normal rate and regular rhythm. Exam  reveals no gallop and no friction rub.  No murmur heard. Pulmonary/Chest: Effort normal and breath sounds normal. No respiratory distress. She has no wheezes.  Abdominal: Soft. Bowel sounds are normal. She exhibits no distension. There is no tenderness.  Musculoskeletal: Normal range of motion.  There is tenderness to palpation to the right buttock and right lateral hip.  Neurological: She is alert and oriented to person, place, and time. She displays normal reflexes.  Patellar and Achilles tendons DTRs are 2+ and symmetrical.  Strength is 5 out of 5 in both lower extremities.  Skin: Skin is warm and dry. She is not diaphoretic.  Nursing note and vitals reviewed.    ED Treatments / Results  Labs (all labs ordered are listed, but only abnormal results are displayed) Labs Reviewed  CBC WITH DIFFERENTIAL/PLATELET - Abnormal; Notable for the following components:      Result Value   Hemoglobin 9.7 (*)    HCT 31.3 (*)    MCH 24.8 (*)    RDW 15.9 (*)    All other components within normal limits  BASIC METABOLIC PANEL - Abnormal; Notable for the following components:   Glucose, Bld 116 (*)    BUN 33 (*)    Creatinine, Ser 2.45 (*)    GFR calc non Af Amer 19 (*)    GFR calc Af Amer 22 (*)    All other components within normal limits  CBG MONITORING, ED - Abnormal; Notable for the following components:   Glucose-Capillary 115 (*)    All other components within normal limits  I-STAT TROPONIN, ED    EKG None  Radiology No results found.  Procedures Procedures (including critical care time)  Medications Ordered in ED Medications  oxyCODONE-acetaminophen (PERCOCET/ROXICET) 5-325 MG per tablet 2 tablet (has no administration in time range)     Initial Impression / Assessment and Plan / ED Course  I have reviewed the triage vital signs and the nursing notes.  Pertinent labs & imaging results that were available during my care of the patient were reviewed by me and considered  in my medical decision making (see chart for details).  Patient presents with pain in her hip and buttock that I strongly believe is sciatica.  She has no bowel or bladder complaints and no back pain.  Her  x-rays of the hip and back show only minimal osteoarthritis.  She will be treated with prednisone, pain medicine and is to follow-up with her primary doctor if not improving for physical therapy or possibly imaging studies.  Final Clinical Impressions(s) / ED Diagnoses   Final diagnoses:  None    ED Discharge Orders    None       Veryl Speak, MD 11/07/17 [phone]

## 2017-11-14 ENCOUNTER — Encounter (HOSPITAL_COMMUNITY): Payer: Medicare Other

## 2017-11-14 ENCOUNTER — Ambulatory Visit: Payer: Medicare Other | Admitting: Family

## 2017-11-17 DIAGNOSIS — E119 Type 2 diabetes mellitus without complications: Secondary | ICD-10-CM | POA: Diagnosis not present

## 2017-11-17 DIAGNOSIS — F419 Anxiety disorder, unspecified: Secondary | ICD-10-CM | POA: Diagnosis not present

## 2017-11-17 DIAGNOSIS — Q245 Malformation of coronary vessels: Secondary | ICD-10-CM | POA: Diagnosis not present

## 2017-11-18 DIAGNOSIS — E119 Type 2 diabetes mellitus without complications: Secondary | ICD-10-CM | POA: Diagnosis not present

## 2017-11-18 DIAGNOSIS — F419 Anxiety disorder, unspecified: Secondary | ICD-10-CM | POA: Diagnosis not present

## 2017-11-18 DIAGNOSIS — Q245 Malformation of coronary vessels: Secondary | ICD-10-CM | POA: Diagnosis not present

## 2017-11-19 DIAGNOSIS — E119 Type 2 diabetes mellitus without complications: Secondary | ICD-10-CM | POA: Diagnosis not present

## 2017-11-19 DIAGNOSIS — F419 Anxiety disorder, unspecified: Secondary | ICD-10-CM | POA: Diagnosis not present

## 2017-11-19 DIAGNOSIS — M79605 Pain in left leg: Secondary | ICD-10-CM | POA: Diagnosis not present

## 2017-11-19 DIAGNOSIS — Q245 Malformation of coronary vessels: Secondary | ICD-10-CM | POA: Diagnosis not present

## 2017-11-19 DIAGNOSIS — M79604 Pain in right leg: Secondary | ICD-10-CM | POA: Diagnosis not present

## 2017-11-20 DIAGNOSIS — E119 Type 2 diabetes mellitus without complications: Secondary | ICD-10-CM | POA: Diagnosis not present

## 2017-11-20 DIAGNOSIS — Q245 Malformation of coronary vessels: Secondary | ICD-10-CM | POA: Diagnosis not present

## 2017-11-20 DIAGNOSIS — F419 Anxiety disorder, unspecified: Secondary | ICD-10-CM | POA: Diagnosis not present

## 2017-11-21 DIAGNOSIS — Q245 Malformation of coronary vessels: Secondary | ICD-10-CM | POA: Diagnosis not present

## 2017-11-21 DIAGNOSIS — E119 Type 2 diabetes mellitus without complications: Secondary | ICD-10-CM | POA: Diagnosis not present

## 2017-11-21 DIAGNOSIS — F419 Anxiety disorder, unspecified: Secondary | ICD-10-CM | POA: Diagnosis not present

## 2017-11-22 DIAGNOSIS — F419 Anxiety disorder, unspecified: Secondary | ICD-10-CM | POA: Diagnosis not present

## 2017-11-22 DIAGNOSIS — Q245 Malformation of coronary vessels: Secondary | ICD-10-CM | POA: Diagnosis not present

## 2017-11-22 DIAGNOSIS — E119 Type 2 diabetes mellitus without complications: Secondary | ICD-10-CM | POA: Diagnosis not present

## 2017-11-23 DIAGNOSIS — Q245 Malformation of coronary vessels: Secondary | ICD-10-CM | POA: Diagnosis not present

## 2017-11-23 DIAGNOSIS — F419 Anxiety disorder, unspecified: Secondary | ICD-10-CM | POA: Diagnosis not present

## 2017-11-23 DIAGNOSIS — E119 Type 2 diabetes mellitus without complications: Secondary | ICD-10-CM | POA: Diagnosis not present

## 2017-11-24 DIAGNOSIS — Q245 Malformation of coronary vessels: Secondary | ICD-10-CM | POA: Diagnosis not present

## 2017-11-24 DIAGNOSIS — E119 Type 2 diabetes mellitus without complications: Secondary | ICD-10-CM | POA: Diagnosis not present

## 2017-11-24 DIAGNOSIS — F419 Anxiety disorder, unspecified: Secondary | ICD-10-CM | POA: Diagnosis not present

## 2017-11-25 DIAGNOSIS — Q245 Malformation of coronary vessels: Secondary | ICD-10-CM | POA: Diagnosis not present

## 2017-11-25 DIAGNOSIS — F419 Anxiety disorder, unspecified: Secondary | ICD-10-CM | POA: Diagnosis not present

## 2017-11-25 DIAGNOSIS — E119 Type 2 diabetes mellitus without complications: Secondary | ICD-10-CM | POA: Diagnosis not present

## 2017-11-26 DIAGNOSIS — F419 Anxiety disorder, unspecified: Secondary | ICD-10-CM | POA: Diagnosis not present

## 2017-11-26 DIAGNOSIS — E119 Type 2 diabetes mellitus without complications: Secondary | ICD-10-CM | POA: Diagnosis not present

## 2017-11-26 DIAGNOSIS — Q245 Malformation of coronary vessels: Secondary | ICD-10-CM | POA: Diagnosis not present

## 2017-11-27 DIAGNOSIS — F419 Anxiety disorder, unspecified: Secondary | ICD-10-CM | POA: Diagnosis not present

## 2017-11-27 DIAGNOSIS — Q245 Malformation of coronary vessels: Secondary | ICD-10-CM | POA: Diagnosis not present

## 2017-11-27 DIAGNOSIS — E119 Type 2 diabetes mellitus without complications: Secondary | ICD-10-CM | POA: Diagnosis not present

## 2017-11-28 DIAGNOSIS — Z1239 Encounter for other screening for malignant neoplasm of breast: Secondary | ICD-10-CM | POA: Diagnosis not present

## 2017-11-28 DIAGNOSIS — F419 Anxiety disorder, unspecified: Secondary | ICD-10-CM | POA: Diagnosis not present

## 2017-11-28 DIAGNOSIS — Z139 Encounter for screening, unspecified: Secondary | ICD-10-CM | POA: Diagnosis not present

## 2017-11-28 DIAGNOSIS — Z1339 Encounter for screening examination for other mental health and behavioral disorders: Secondary | ICD-10-CM | POA: Diagnosis not present

## 2017-11-28 DIAGNOSIS — Z9181 History of falling: Secondary | ICD-10-CM | POA: Diagnosis not present

## 2017-11-28 DIAGNOSIS — Z0000 Encounter for general adult medical examination without abnormal findings: Secondary | ICD-10-CM | POA: Diagnosis not present

## 2017-11-28 DIAGNOSIS — E119 Type 2 diabetes mellitus without complications: Secondary | ICD-10-CM | POA: Diagnosis not present

## 2017-11-28 DIAGNOSIS — Z1211 Encounter for screening for malignant neoplasm of colon: Secondary | ICD-10-CM | POA: Diagnosis not present

## 2017-11-28 DIAGNOSIS — E785 Hyperlipidemia, unspecified: Secondary | ICD-10-CM | POA: Diagnosis not present

## 2017-11-28 DIAGNOSIS — N959 Unspecified menopausal and perimenopausal disorder: Secondary | ICD-10-CM | POA: Diagnosis not present

## 2017-11-28 DIAGNOSIS — Q245 Malformation of coronary vessels: Secondary | ICD-10-CM | POA: Diagnosis not present

## 2017-11-29 DIAGNOSIS — F419 Anxiety disorder, unspecified: Secondary | ICD-10-CM | POA: Diagnosis not present

## 2017-11-29 DIAGNOSIS — Q245 Malformation of coronary vessels: Secondary | ICD-10-CM | POA: Diagnosis not present

## 2017-11-29 DIAGNOSIS — E119 Type 2 diabetes mellitus without complications: Secondary | ICD-10-CM | POA: Diagnosis not present

## 2017-11-30 DIAGNOSIS — F419 Anxiety disorder, unspecified: Secondary | ICD-10-CM | POA: Diagnosis not present

## 2017-11-30 DIAGNOSIS — Q245 Malformation of coronary vessels: Secondary | ICD-10-CM | POA: Diagnosis not present

## 2017-11-30 DIAGNOSIS — E119 Type 2 diabetes mellitus without complications: Secondary | ICD-10-CM | POA: Diagnosis not present

## 2017-12-01 DIAGNOSIS — Q245 Malformation of coronary vessels: Secondary | ICD-10-CM | POA: Diagnosis not present

## 2017-12-01 DIAGNOSIS — E119 Type 2 diabetes mellitus without complications: Secondary | ICD-10-CM | POA: Diagnosis not present

## 2017-12-01 DIAGNOSIS — F419 Anxiety disorder, unspecified: Secondary | ICD-10-CM | POA: Diagnosis not present

## 2017-12-02 DIAGNOSIS — E119 Type 2 diabetes mellitus without complications: Secondary | ICD-10-CM | POA: Diagnosis not present

## 2017-12-02 DIAGNOSIS — E663 Overweight: Secondary | ICD-10-CM | POA: Diagnosis not present

## 2017-12-02 DIAGNOSIS — D509 Iron deficiency anemia, unspecified: Secondary | ICD-10-CM | POA: Diagnosis not present

## 2017-12-02 DIAGNOSIS — E782 Mixed hyperlipidemia: Secondary | ICD-10-CM | POA: Diagnosis not present

## 2017-12-02 DIAGNOSIS — I10 Essential (primary) hypertension: Secondary | ICD-10-CM | POA: Diagnosis not present

## 2017-12-02 DIAGNOSIS — I2510 Atherosclerotic heart disease of native coronary artery without angina pectoris: Secondary | ICD-10-CM | POA: Diagnosis not present

## 2017-12-02 DIAGNOSIS — I739 Peripheral vascular disease, unspecified: Secondary | ICD-10-CM | POA: Diagnosis not present

## 2017-12-02 DIAGNOSIS — Q245 Malformation of coronary vessels: Secondary | ICD-10-CM | POA: Diagnosis not present

## 2017-12-02 DIAGNOSIS — F419 Anxiety disorder, unspecified: Secondary | ICD-10-CM | POA: Diagnosis not present

## 2017-12-03 DIAGNOSIS — Q245 Malformation of coronary vessels: Secondary | ICD-10-CM | POA: Diagnosis not present

## 2017-12-03 DIAGNOSIS — E119 Type 2 diabetes mellitus without complications: Secondary | ICD-10-CM | POA: Diagnosis not present

## 2017-12-03 DIAGNOSIS — F419 Anxiety disorder, unspecified: Secondary | ICD-10-CM | POA: Diagnosis not present

## 2017-12-04 DIAGNOSIS — Q245 Malformation of coronary vessels: Secondary | ICD-10-CM | POA: Diagnosis not present

## 2017-12-04 DIAGNOSIS — E119 Type 2 diabetes mellitus without complications: Secondary | ICD-10-CM | POA: Diagnosis not present

## 2017-12-04 DIAGNOSIS — F419 Anxiety disorder, unspecified: Secondary | ICD-10-CM | POA: Diagnosis not present

## 2017-12-05 DIAGNOSIS — E119 Type 2 diabetes mellitus without complications: Secondary | ICD-10-CM | POA: Diagnosis not present

## 2017-12-05 DIAGNOSIS — Q245 Malformation of coronary vessels: Secondary | ICD-10-CM | POA: Diagnosis not present

## 2017-12-05 DIAGNOSIS — F419 Anxiety disorder, unspecified: Secondary | ICD-10-CM | POA: Diagnosis not present

## 2017-12-06 DIAGNOSIS — F419 Anxiety disorder, unspecified: Secondary | ICD-10-CM | POA: Diagnosis not present

## 2017-12-06 DIAGNOSIS — Q245 Malformation of coronary vessels: Secondary | ICD-10-CM | POA: Diagnosis not present

## 2017-12-06 DIAGNOSIS — E119 Type 2 diabetes mellitus without complications: Secondary | ICD-10-CM | POA: Diagnosis not present

## 2017-12-07 DIAGNOSIS — Q245 Malformation of coronary vessels: Secondary | ICD-10-CM | POA: Diagnosis not present

## 2017-12-07 DIAGNOSIS — E119 Type 2 diabetes mellitus without complications: Secondary | ICD-10-CM | POA: Diagnosis not present

## 2017-12-07 DIAGNOSIS — F419 Anxiety disorder, unspecified: Secondary | ICD-10-CM | POA: Diagnosis not present

## 2017-12-08 DIAGNOSIS — E119 Type 2 diabetes mellitus without complications: Secondary | ICD-10-CM | POA: Diagnosis not present

## 2017-12-08 DIAGNOSIS — Q245 Malformation of coronary vessels: Secondary | ICD-10-CM | POA: Diagnosis not present

## 2017-12-08 DIAGNOSIS — F419 Anxiety disorder, unspecified: Secondary | ICD-10-CM | POA: Diagnosis not present

## 2017-12-09 DIAGNOSIS — E119 Type 2 diabetes mellitus without complications: Secondary | ICD-10-CM | POA: Diagnosis not present

## 2017-12-09 DIAGNOSIS — Q245 Malformation of coronary vessels: Secondary | ICD-10-CM | POA: Diagnosis not present

## 2017-12-09 DIAGNOSIS — F419 Anxiety disorder, unspecified: Secondary | ICD-10-CM | POA: Diagnosis not present

## 2017-12-10 DIAGNOSIS — I252 Old myocardial infarction: Secondary | ICD-10-CM | POA: Diagnosis not present

## 2017-12-10 DIAGNOSIS — Z9889 Other specified postprocedural states: Secondary | ICD-10-CM | POA: Insufficient documentation

## 2017-12-10 DIAGNOSIS — I739 Peripheral vascular disease, unspecified: Secondary | ICD-10-CM | POA: Diagnosis not present

## 2017-12-10 DIAGNOSIS — I2510 Atherosclerotic heart disease of native coronary artery without angina pectoris: Secondary | ICD-10-CM | POA: Diagnosis not present

## 2017-12-10 DIAGNOSIS — E785 Hyperlipidemia, unspecified: Secondary | ICD-10-CM | POA: Diagnosis not present

## 2017-12-10 DIAGNOSIS — I6523 Occlusion and stenosis of bilateral carotid arteries: Secondary | ICD-10-CM | POA: Diagnosis not present

## 2017-12-10 DIAGNOSIS — F419 Anxiety disorder, unspecified: Secondary | ICD-10-CM | POA: Diagnosis not present

## 2017-12-10 DIAGNOSIS — Q245 Malformation of coronary vessels: Secondary | ICD-10-CM | POA: Diagnosis not present

## 2017-12-10 DIAGNOSIS — E119 Type 2 diabetes mellitus without complications: Secondary | ICD-10-CM | POA: Diagnosis not present

## 2017-12-11 DIAGNOSIS — E119 Type 2 diabetes mellitus without complications: Secondary | ICD-10-CM | POA: Diagnosis not present

## 2017-12-11 DIAGNOSIS — Q245 Malformation of coronary vessels: Secondary | ICD-10-CM | POA: Diagnosis not present

## 2017-12-11 DIAGNOSIS — F419 Anxiety disorder, unspecified: Secondary | ICD-10-CM | POA: Diagnosis not present

## 2017-12-12 DIAGNOSIS — E119 Type 2 diabetes mellitus without complications: Secondary | ICD-10-CM | POA: Diagnosis not present

## 2017-12-12 DIAGNOSIS — F419 Anxiety disorder, unspecified: Secondary | ICD-10-CM | POA: Diagnosis not present

## 2017-12-12 DIAGNOSIS — Q245 Malformation of coronary vessels: Secondary | ICD-10-CM | POA: Diagnosis not present

## 2017-12-13 DIAGNOSIS — F419 Anxiety disorder, unspecified: Secondary | ICD-10-CM | POA: Diagnosis not present

## 2017-12-13 DIAGNOSIS — Q245 Malformation of coronary vessels: Secondary | ICD-10-CM | POA: Diagnosis not present

## 2017-12-13 DIAGNOSIS — E119 Type 2 diabetes mellitus without complications: Secondary | ICD-10-CM | POA: Diagnosis not present

## 2017-12-14 DIAGNOSIS — E119 Type 2 diabetes mellitus without complications: Secondary | ICD-10-CM | POA: Diagnosis not present

## 2017-12-14 DIAGNOSIS — F419 Anxiety disorder, unspecified: Secondary | ICD-10-CM | POA: Diagnosis not present

## 2017-12-14 DIAGNOSIS — Q245 Malformation of coronary vessels: Secondary | ICD-10-CM | POA: Diagnosis not present

## 2017-12-15 DIAGNOSIS — Q245 Malformation of coronary vessels: Secondary | ICD-10-CM | POA: Diagnosis not present

## 2017-12-15 DIAGNOSIS — F419 Anxiety disorder, unspecified: Secondary | ICD-10-CM | POA: Diagnosis not present

## 2017-12-15 DIAGNOSIS — E119 Type 2 diabetes mellitus without complications: Secondary | ICD-10-CM | POA: Diagnosis not present

## 2017-12-16 DIAGNOSIS — H5200 Hypermetropia, unspecified eye: Secondary | ICD-10-CM | POA: Diagnosis not present

## 2017-12-16 DIAGNOSIS — H524 Presbyopia: Secondary | ICD-10-CM | POA: Diagnosis not present

## 2017-12-16 DIAGNOSIS — Q245 Malformation of coronary vessels: Secondary | ICD-10-CM | POA: Diagnosis not present

## 2017-12-16 DIAGNOSIS — F419 Anxiety disorder, unspecified: Secondary | ICD-10-CM | POA: Diagnosis not present

## 2017-12-16 DIAGNOSIS — H52209 Unspecified astigmatism, unspecified eye: Secondary | ICD-10-CM | POA: Diagnosis not present

## 2017-12-16 DIAGNOSIS — H5203 Hypermetropia, bilateral: Secondary | ICD-10-CM | POA: Diagnosis not present

## 2017-12-16 DIAGNOSIS — E119 Type 2 diabetes mellitus without complications: Secondary | ICD-10-CM | POA: Diagnosis not present

## 2017-12-17 DIAGNOSIS — F419 Anxiety disorder, unspecified: Secondary | ICD-10-CM | POA: Diagnosis not present

## 2017-12-17 DIAGNOSIS — I2510 Atherosclerotic heart disease of native coronary artery without angina pectoris: Secondary | ICD-10-CM | POA: Diagnosis not present

## 2017-12-17 DIAGNOSIS — Q245 Malformation of coronary vessels: Secondary | ICD-10-CM | POA: Diagnosis not present

## 2017-12-17 DIAGNOSIS — E119 Type 2 diabetes mellitus without complications: Secondary | ICD-10-CM | POA: Diagnosis not present

## 2017-12-18 DIAGNOSIS — Q245 Malformation of coronary vessels: Secondary | ICD-10-CM | POA: Diagnosis not present

## 2017-12-18 DIAGNOSIS — E119 Type 2 diabetes mellitus without complications: Secondary | ICD-10-CM | POA: Diagnosis not present

## 2017-12-18 DIAGNOSIS — F419 Anxiety disorder, unspecified: Secondary | ICD-10-CM | POA: Diagnosis not present

## 2017-12-19 DIAGNOSIS — Q245 Malformation of coronary vessels: Secondary | ICD-10-CM | POA: Diagnosis not present

## 2017-12-19 DIAGNOSIS — E119 Type 2 diabetes mellitus without complications: Secondary | ICD-10-CM | POA: Diagnosis not present

## 2017-12-19 DIAGNOSIS — F419 Anxiety disorder, unspecified: Secondary | ICD-10-CM | POA: Diagnosis not present

## 2017-12-20 DIAGNOSIS — E119 Type 2 diabetes mellitus without complications: Secondary | ICD-10-CM | POA: Diagnosis not present

## 2017-12-20 DIAGNOSIS — F419 Anxiety disorder, unspecified: Secondary | ICD-10-CM | POA: Diagnosis not present

## 2017-12-20 DIAGNOSIS — Q245 Malformation of coronary vessels: Secondary | ICD-10-CM | POA: Diagnosis not present

## 2017-12-21 DIAGNOSIS — Q245 Malformation of coronary vessels: Secondary | ICD-10-CM | POA: Diagnosis not present

## 2017-12-21 DIAGNOSIS — F419 Anxiety disorder, unspecified: Secondary | ICD-10-CM | POA: Diagnosis not present

## 2017-12-21 DIAGNOSIS — E119 Type 2 diabetes mellitus without complications: Secondary | ICD-10-CM | POA: Diagnosis not present

## 2017-12-22 DIAGNOSIS — F419 Anxiety disorder, unspecified: Secondary | ICD-10-CM | POA: Diagnosis not present

## 2017-12-22 DIAGNOSIS — E119 Type 2 diabetes mellitus without complications: Secondary | ICD-10-CM | POA: Diagnosis not present

## 2017-12-22 DIAGNOSIS — Q245 Malformation of coronary vessels: Secondary | ICD-10-CM | POA: Diagnosis not present

## 2017-12-23 DIAGNOSIS — F419 Anxiety disorder, unspecified: Secondary | ICD-10-CM | POA: Diagnosis not present

## 2017-12-23 DIAGNOSIS — E119 Type 2 diabetes mellitus without complications: Secondary | ICD-10-CM | POA: Diagnosis not present

## 2017-12-23 DIAGNOSIS — Q245 Malformation of coronary vessels: Secondary | ICD-10-CM | POA: Diagnosis not present

## 2017-12-24 DIAGNOSIS — E119 Type 2 diabetes mellitus without complications: Secondary | ICD-10-CM | POA: Diagnosis not present

## 2017-12-24 DIAGNOSIS — Q245 Malformation of coronary vessels: Secondary | ICD-10-CM | POA: Diagnosis not present

## 2017-12-24 DIAGNOSIS — F419 Anxiety disorder, unspecified: Secondary | ICD-10-CM | POA: Diagnosis not present

## 2017-12-25 DIAGNOSIS — E119 Type 2 diabetes mellitus without complications: Secondary | ICD-10-CM | POA: Diagnosis not present

## 2017-12-25 DIAGNOSIS — Q245 Malformation of coronary vessels: Secondary | ICD-10-CM | POA: Diagnosis not present

## 2017-12-25 DIAGNOSIS — F419 Anxiety disorder, unspecified: Secondary | ICD-10-CM | POA: Diagnosis not present

## 2017-12-26 DIAGNOSIS — F419 Anxiety disorder, unspecified: Secondary | ICD-10-CM | POA: Diagnosis not present

## 2017-12-26 DIAGNOSIS — E119 Type 2 diabetes mellitus without complications: Secondary | ICD-10-CM | POA: Diagnosis not present

## 2017-12-26 DIAGNOSIS — Q245 Malformation of coronary vessels: Secondary | ICD-10-CM | POA: Diagnosis not present

## 2017-12-27 DIAGNOSIS — Q245 Malformation of coronary vessels: Secondary | ICD-10-CM | POA: Diagnosis not present

## 2017-12-27 DIAGNOSIS — E119 Type 2 diabetes mellitus without complications: Secondary | ICD-10-CM | POA: Diagnosis not present

## 2017-12-27 DIAGNOSIS — F419 Anxiety disorder, unspecified: Secondary | ICD-10-CM | POA: Diagnosis not present

## 2017-12-28 DIAGNOSIS — Q245 Malformation of coronary vessels: Secondary | ICD-10-CM | POA: Diagnosis not present

## 2017-12-28 DIAGNOSIS — F419 Anxiety disorder, unspecified: Secondary | ICD-10-CM | POA: Diagnosis not present

## 2017-12-28 DIAGNOSIS — E119 Type 2 diabetes mellitus without complications: Secondary | ICD-10-CM | POA: Diagnosis not present

## 2017-12-29 DIAGNOSIS — Q245 Malformation of coronary vessels: Secondary | ICD-10-CM | POA: Diagnosis not present

## 2017-12-29 DIAGNOSIS — F419 Anxiety disorder, unspecified: Secondary | ICD-10-CM | POA: Diagnosis not present

## 2017-12-29 DIAGNOSIS — E119 Type 2 diabetes mellitus without complications: Secondary | ICD-10-CM | POA: Diagnosis not present

## 2017-12-30 DIAGNOSIS — Q245 Malformation of coronary vessels: Secondary | ICD-10-CM | POA: Diagnosis not present

## 2017-12-30 DIAGNOSIS — E119 Type 2 diabetes mellitus without complications: Secondary | ICD-10-CM | POA: Diagnosis not present

## 2017-12-30 DIAGNOSIS — F419 Anxiety disorder, unspecified: Secondary | ICD-10-CM | POA: Diagnosis not present

## 2017-12-31 DIAGNOSIS — F419 Anxiety disorder, unspecified: Secondary | ICD-10-CM | POA: Diagnosis not present

## 2017-12-31 DIAGNOSIS — Q245 Malformation of coronary vessels: Secondary | ICD-10-CM | POA: Diagnosis not present

## 2017-12-31 DIAGNOSIS — E119 Type 2 diabetes mellitus without complications: Secondary | ICD-10-CM | POA: Diagnosis not present

## 2018-01-01 DIAGNOSIS — E119 Type 2 diabetes mellitus without complications: Secondary | ICD-10-CM | POA: Diagnosis not present

## 2018-01-01 DIAGNOSIS — Q245 Malformation of coronary vessels: Secondary | ICD-10-CM | POA: Diagnosis not present

## 2018-01-01 DIAGNOSIS — F419 Anxiety disorder, unspecified: Secondary | ICD-10-CM | POA: Diagnosis not present

## 2018-01-02 DIAGNOSIS — F419 Anxiety disorder, unspecified: Secondary | ICD-10-CM | POA: Diagnosis not present

## 2018-01-02 DIAGNOSIS — E119 Type 2 diabetes mellitus without complications: Secondary | ICD-10-CM | POA: Diagnosis not present

## 2018-01-02 DIAGNOSIS — Q245 Malformation of coronary vessels: Secondary | ICD-10-CM | POA: Diagnosis not present

## 2018-01-03 DIAGNOSIS — F419 Anxiety disorder, unspecified: Secondary | ICD-10-CM | POA: Diagnosis not present

## 2018-01-03 DIAGNOSIS — Q245 Malformation of coronary vessels: Secondary | ICD-10-CM | POA: Diagnosis not present

## 2018-01-03 DIAGNOSIS — E119 Type 2 diabetes mellitus without complications: Secondary | ICD-10-CM | POA: Diagnosis not present

## 2018-01-04 DIAGNOSIS — Q245 Malformation of coronary vessels: Secondary | ICD-10-CM | POA: Diagnosis not present

## 2018-01-04 DIAGNOSIS — E119 Type 2 diabetes mellitus without complications: Secondary | ICD-10-CM | POA: Diagnosis not present

## 2018-01-04 DIAGNOSIS — F419 Anxiety disorder, unspecified: Secondary | ICD-10-CM | POA: Diagnosis not present

## 2018-01-05 ENCOUNTER — Encounter (HOSPITAL_COMMUNITY): Payer: Medicare Other

## 2018-01-05 ENCOUNTER — Ambulatory Visit: Payer: Medicare Other | Admitting: Family

## 2018-01-19 DIAGNOSIS — F419 Anxiety disorder, unspecified: Secondary | ICD-10-CM | POA: Diagnosis not present

## 2018-01-19 DIAGNOSIS — Q245 Malformation of coronary vessels: Secondary | ICD-10-CM | POA: Diagnosis not present

## 2018-01-19 DIAGNOSIS — E119 Type 2 diabetes mellitus without complications: Secondary | ICD-10-CM | POA: Diagnosis not present

## 2018-01-20 DIAGNOSIS — E119 Type 2 diabetes mellitus without complications: Secondary | ICD-10-CM | POA: Diagnosis not present

## 2018-01-20 DIAGNOSIS — Q245 Malformation of coronary vessels: Secondary | ICD-10-CM | POA: Diagnosis not present

## 2018-01-20 DIAGNOSIS — F419 Anxiety disorder, unspecified: Secondary | ICD-10-CM | POA: Diagnosis not present

## 2018-01-21 DIAGNOSIS — E119 Type 2 diabetes mellitus without complications: Secondary | ICD-10-CM | POA: Diagnosis not present

## 2018-01-21 DIAGNOSIS — F419 Anxiety disorder, unspecified: Secondary | ICD-10-CM | POA: Diagnosis not present

## 2018-01-21 DIAGNOSIS — Q245 Malformation of coronary vessels: Secondary | ICD-10-CM | POA: Diagnosis not present

## 2018-01-22 DIAGNOSIS — Q245 Malformation of coronary vessels: Secondary | ICD-10-CM | POA: Diagnosis not present

## 2018-01-22 DIAGNOSIS — E119 Type 2 diabetes mellitus without complications: Secondary | ICD-10-CM | POA: Diagnosis not present

## 2018-01-22 DIAGNOSIS — F419 Anxiety disorder, unspecified: Secondary | ICD-10-CM | POA: Diagnosis not present

## 2018-01-23 DIAGNOSIS — F419 Anxiety disorder, unspecified: Secondary | ICD-10-CM | POA: Diagnosis not present

## 2018-01-23 DIAGNOSIS — E119 Type 2 diabetes mellitus without complications: Secondary | ICD-10-CM | POA: Diagnosis not present

## 2018-01-23 DIAGNOSIS — Q245 Malformation of coronary vessels: Secondary | ICD-10-CM | POA: Diagnosis not present

## 2018-01-24 DIAGNOSIS — F419 Anxiety disorder, unspecified: Secondary | ICD-10-CM | POA: Diagnosis not present

## 2018-01-24 DIAGNOSIS — Q245 Malformation of coronary vessels: Secondary | ICD-10-CM | POA: Diagnosis not present

## 2018-01-24 DIAGNOSIS — E119 Type 2 diabetes mellitus without complications: Secondary | ICD-10-CM | POA: Diagnosis not present

## 2018-01-25 DIAGNOSIS — E119 Type 2 diabetes mellitus without complications: Secondary | ICD-10-CM | POA: Diagnosis not present

## 2018-01-25 DIAGNOSIS — F419 Anxiety disorder, unspecified: Secondary | ICD-10-CM | POA: Diagnosis not present

## 2018-01-25 DIAGNOSIS — Q245 Malformation of coronary vessels: Secondary | ICD-10-CM | POA: Diagnosis not present

## 2018-01-26 DIAGNOSIS — Q245 Malformation of coronary vessels: Secondary | ICD-10-CM | POA: Diagnosis not present

## 2018-01-26 DIAGNOSIS — E119 Type 2 diabetes mellitus without complications: Secondary | ICD-10-CM | POA: Diagnosis not present

## 2018-01-26 DIAGNOSIS — F419 Anxiety disorder, unspecified: Secondary | ICD-10-CM | POA: Diagnosis not present

## 2018-01-27 DIAGNOSIS — F419 Anxiety disorder, unspecified: Secondary | ICD-10-CM | POA: Diagnosis not present

## 2018-01-27 DIAGNOSIS — E119 Type 2 diabetes mellitus without complications: Secondary | ICD-10-CM | POA: Diagnosis not present

## 2018-01-27 DIAGNOSIS — Q245 Malformation of coronary vessels: Secondary | ICD-10-CM | POA: Diagnosis not present

## 2018-01-28 DIAGNOSIS — F419 Anxiety disorder, unspecified: Secondary | ICD-10-CM | POA: Diagnosis not present

## 2018-01-28 DIAGNOSIS — Q245 Malformation of coronary vessels: Secondary | ICD-10-CM | POA: Diagnosis not present

## 2018-01-28 DIAGNOSIS — E119 Type 2 diabetes mellitus without complications: Secondary | ICD-10-CM | POA: Diagnosis not present

## 2018-01-29 DIAGNOSIS — E119 Type 2 diabetes mellitus without complications: Secondary | ICD-10-CM | POA: Diagnosis not present

## 2018-01-29 DIAGNOSIS — F419 Anxiety disorder, unspecified: Secondary | ICD-10-CM | POA: Diagnosis not present

## 2018-01-29 DIAGNOSIS — Q245 Malformation of coronary vessels: Secondary | ICD-10-CM | POA: Diagnosis not present

## 2018-01-30 DIAGNOSIS — E119 Type 2 diabetes mellitus without complications: Secondary | ICD-10-CM | POA: Diagnosis not present

## 2018-01-30 DIAGNOSIS — Q245 Malformation of coronary vessels: Secondary | ICD-10-CM | POA: Diagnosis not present

## 2018-01-30 DIAGNOSIS — F419 Anxiety disorder, unspecified: Secondary | ICD-10-CM | POA: Diagnosis not present

## 2018-01-31 DIAGNOSIS — Q245 Malformation of coronary vessels: Secondary | ICD-10-CM | POA: Diagnosis not present

## 2018-01-31 DIAGNOSIS — E119 Type 2 diabetes mellitus without complications: Secondary | ICD-10-CM | POA: Diagnosis not present

## 2018-01-31 DIAGNOSIS — F419 Anxiety disorder, unspecified: Secondary | ICD-10-CM | POA: Diagnosis not present

## 2018-02-01 DIAGNOSIS — F419 Anxiety disorder, unspecified: Secondary | ICD-10-CM | POA: Diagnosis not present

## 2018-02-01 DIAGNOSIS — E119 Type 2 diabetes mellitus without complications: Secondary | ICD-10-CM | POA: Diagnosis not present

## 2018-02-01 DIAGNOSIS — Q245 Malformation of coronary vessels: Secondary | ICD-10-CM | POA: Diagnosis not present

## 2018-02-02 DIAGNOSIS — E119 Type 2 diabetes mellitus without complications: Secondary | ICD-10-CM | POA: Diagnosis not present

## 2018-02-02 DIAGNOSIS — Q245 Malformation of coronary vessels: Secondary | ICD-10-CM | POA: Diagnosis not present

## 2018-02-02 DIAGNOSIS — F419 Anxiety disorder, unspecified: Secondary | ICD-10-CM | POA: Diagnosis not present

## 2018-02-03 DIAGNOSIS — Q245 Malformation of coronary vessels: Secondary | ICD-10-CM | POA: Diagnosis not present

## 2018-02-03 DIAGNOSIS — E119 Type 2 diabetes mellitus without complications: Secondary | ICD-10-CM | POA: Diagnosis not present

## 2018-02-03 DIAGNOSIS — F419 Anxiety disorder, unspecified: Secondary | ICD-10-CM | POA: Diagnosis not present

## 2018-02-04 DIAGNOSIS — Q245 Malformation of coronary vessels: Secondary | ICD-10-CM | POA: Diagnosis not present

## 2018-02-04 DIAGNOSIS — F419 Anxiety disorder, unspecified: Secondary | ICD-10-CM | POA: Diagnosis not present

## 2018-02-04 DIAGNOSIS — E119 Type 2 diabetes mellitus without complications: Secondary | ICD-10-CM | POA: Diagnosis not present

## 2018-02-05 DIAGNOSIS — F419 Anxiety disorder, unspecified: Secondary | ICD-10-CM | POA: Diagnosis not present

## 2018-02-05 DIAGNOSIS — Q245 Malformation of coronary vessels: Secondary | ICD-10-CM | POA: Diagnosis not present

## 2018-02-05 DIAGNOSIS — E119 Type 2 diabetes mellitus without complications: Secondary | ICD-10-CM | POA: Diagnosis not present

## 2018-02-06 DIAGNOSIS — F419 Anxiety disorder, unspecified: Secondary | ICD-10-CM | POA: Diagnosis not present

## 2018-02-06 DIAGNOSIS — Q245 Malformation of coronary vessels: Secondary | ICD-10-CM | POA: Diagnosis not present

## 2018-02-06 DIAGNOSIS — E119 Type 2 diabetes mellitus without complications: Secondary | ICD-10-CM | POA: Diagnosis not present

## 2018-02-07 DIAGNOSIS — E119 Type 2 diabetes mellitus without complications: Secondary | ICD-10-CM | POA: Diagnosis not present

## 2018-02-07 DIAGNOSIS — Q245 Malformation of coronary vessels: Secondary | ICD-10-CM | POA: Diagnosis not present

## 2018-02-07 DIAGNOSIS — F419 Anxiety disorder, unspecified: Secondary | ICD-10-CM | POA: Diagnosis not present

## 2018-02-08 DIAGNOSIS — F419 Anxiety disorder, unspecified: Secondary | ICD-10-CM | POA: Diagnosis not present

## 2018-02-08 DIAGNOSIS — E119 Type 2 diabetes mellitus without complications: Secondary | ICD-10-CM | POA: Diagnosis not present

## 2018-02-08 DIAGNOSIS — Q245 Malformation of coronary vessels: Secondary | ICD-10-CM | POA: Diagnosis not present

## 2018-02-09 DIAGNOSIS — F419 Anxiety disorder, unspecified: Secondary | ICD-10-CM | POA: Diagnosis not present

## 2018-02-09 DIAGNOSIS — E119 Type 2 diabetes mellitus without complications: Secondary | ICD-10-CM | POA: Diagnosis not present

## 2018-02-09 DIAGNOSIS — Q245 Malformation of coronary vessels: Secondary | ICD-10-CM | POA: Diagnosis not present

## 2018-02-10 DIAGNOSIS — E119 Type 2 diabetes mellitus without complications: Secondary | ICD-10-CM | POA: Diagnosis not present

## 2018-02-10 DIAGNOSIS — Q245 Malformation of coronary vessels: Secondary | ICD-10-CM | POA: Diagnosis not present

## 2018-02-10 DIAGNOSIS — F419 Anxiety disorder, unspecified: Secondary | ICD-10-CM | POA: Diagnosis not present

## 2018-02-11 DIAGNOSIS — F419 Anxiety disorder, unspecified: Secondary | ICD-10-CM | POA: Diagnosis not present

## 2018-02-11 DIAGNOSIS — E119 Type 2 diabetes mellitus without complications: Secondary | ICD-10-CM | POA: Diagnosis not present

## 2018-02-11 DIAGNOSIS — Q245 Malformation of coronary vessels: Secondary | ICD-10-CM | POA: Diagnosis not present

## 2018-02-12 DIAGNOSIS — E119 Type 2 diabetes mellitus without complications: Secondary | ICD-10-CM | POA: Diagnosis not present

## 2018-02-12 DIAGNOSIS — Q245 Malformation of coronary vessels: Secondary | ICD-10-CM | POA: Diagnosis not present

## 2018-02-12 DIAGNOSIS — F419 Anxiety disorder, unspecified: Secondary | ICD-10-CM | POA: Diagnosis not present

## 2018-02-12 DIAGNOSIS — I2510 Atherosclerotic heart disease of native coronary artery without angina pectoris: Secondary | ICD-10-CM | POA: Diagnosis not present

## 2018-02-12 DIAGNOSIS — I739 Peripheral vascular disease, unspecified: Secondary | ICD-10-CM | POA: Diagnosis not present

## 2018-02-13 DIAGNOSIS — F419 Anxiety disorder, unspecified: Secondary | ICD-10-CM | POA: Diagnosis not present

## 2018-02-13 DIAGNOSIS — Q245 Malformation of coronary vessels: Secondary | ICD-10-CM | POA: Diagnosis not present

## 2018-02-13 DIAGNOSIS — E119 Type 2 diabetes mellitus without complications: Secondary | ICD-10-CM | POA: Diagnosis not present

## 2018-02-14 DIAGNOSIS — E119 Type 2 diabetes mellitus without complications: Secondary | ICD-10-CM | POA: Diagnosis not present

## 2018-02-14 DIAGNOSIS — Q245 Malformation of coronary vessels: Secondary | ICD-10-CM | POA: Diagnosis not present

## 2018-02-14 DIAGNOSIS — F419 Anxiety disorder, unspecified: Secondary | ICD-10-CM | POA: Diagnosis not present

## 2018-02-15 DIAGNOSIS — Q245 Malformation of coronary vessels: Secondary | ICD-10-CM | POA: Diagnosis not present

## 2018-02-15 DIAGNOSIS — F419 Anxiety disorder, unspecified: Secondary | ICD-10-CM | POA: Diagnosis not present

## 2018-02-15 DIAGNOSIS — E119 Type 2 diabetes mellitus without complications: Secondary | ICD-10-CM | POA: Diagnosis not present

## 2018-03-02 DIAGNOSIS — Q245 Malformation of coronary vessels: Secondary | ICD-10-CM | POA: Diagnosis not present

## 2018-03-02 DIAGNOSIS — I2510 Atherosclerotic heart disease of native coronary artery without angina pectoris: Secondary | ICD-10-CM | POA: Diagnosis not present

## 2018-03-02 DIAGNOSIS — E559 Vitamin D deficiency, unspecified: Secondary | ICD-10-CM | POA: Diagnosis not present

## 2018-03-02 DIAGNOSIS — I10 Essential (primary) hypertension: Secondary | ICD-10-CM | POA: Diagnosis not present

## 2018-03-02 DIAGNOSIS — N183 Chronic kidney disease, stage 3 (moderate): Secondary | ICD-10-CM | POA: Diagnosis not present

## 2018-03-02 DIAGNOSIS — E119 Type 2 diabetes mellitus without complications: Secondary | ICD-10-CM | POA: Diagnosis not present

## 2018-03-02 DIAGNOSIS — I739 Peripheral vascular disease, unspecified: Secondary | ICD-10-CM | POA: Diagnosis not present

## 2018-03-02 DIAGNOSIS — F419 Anxiety disorder, unspecified: Secondary | ICD-10-CM | POA: Diagnosis not present

## 2018-03-02 DIAGNOSIS — D509 Iron deficiency anemia, unspecified: Secondary | ICD-10-CM | POA: Diagnosis not present

## 2018-03-02 DIAGNOSIS — E782 Mixed hyperlipidemia: Secondary | ICD-10-CM | POA: Diagnosis not present

## 2018-03-02 DIAGNOSIS — Z1231 Encounter for screening mammogram for malignant neoplasm of breast: Secondary | ICD-10-CM | POA: Diagnosis not present

## 2018-03-02 DIAGNOSIS — E663 Overweight: Secondary | ICD-10-CM | POA: Diagnosis not present

## 2018-03-03 DIAGNOSIS — F419 Anxiety disorder, unspecified: Secondary | ICD-10-CM | POA: Diagnosis not present

## 2018-03-03 DIAGNOSIS — E119 Type 2 diabetes mellitus without complications: Secondary | ICD-10-CM | POA: Diagnosis not present

## 2018-03-03 DIAGNOSIS — Q245 Malformation of coronary vessels: Secondary | ICD-10-CM | POA: Diagnosis not present

## 2018-03-04 DIAGNOSIS — Q245 Malformation of coronary vessels: Secondary | ICD-10-CM | POA: Diagnosis not present

## 2018-03-04 DIAGNOSIS — F419 Anxiety disorder, unspecified: Secondary | ICD-10-CM | POA: Diagnosis not present

## 2018-03-04 DIAGNOSIS — E119 Type 2 diabetes mellitus without complications: Secondary | ICD-10-CM | POA: Diagnosis not present

## 2018-03-05 DIAGNOSIS — E119 Type 2 diabetes mellitus without complications: Secondary | ICD-10-CM | POA: Diagnosis not present

## 2018-03-05 DIAGNOSIS — Q245 Malformation of coronary vessels: Secondary | ICD-10-CM | POA: Diagnosis not present

## 2018-03-05 DIAGNOSIS — F419 Anxiety disorder, unspecified: Secondary | ICD-10-CM | POA: Diagnosis not present

## 2018-03-06 DIAGNOSIS — E119 Type 2 diabetes mellitus without complications: Secondary | ICD-10-CM | POA: Diagnosis not present

## 2018-03-06 DIAGNOSIS — Q245 Malformation of coronary vessels: Secondary | ICD-10-CM | POA: Diagnosis not present

## 2018-03-06 DIAGNOSIS — F419 Anxiety disorder, unspecified: Secondary | ICD-10-CM | POA: Diagnosis not present

## 2018-03-07 DIAGNOSIS — Q245 Malformation of coronary vessels: Secondary | ICD-10-CM | POA: Diagnosis not present

## 2018-03-07 DIAGNOSIS — F419 Anxiety disorder, unspecified: Secondary | ICD-10-CM | POA: Diagnosis not present

## 2018-03-07 DIAGNOSIS — E119 Type 2 diabetes mellitus without complications: Secondary | ICD-10-CM | POA: Diagnosis not present

## 2018-03-08 DIAGNOSIS — E119 Type 2 diabetes mellitus without complications: Secondary | ICD-10-CM | POA: Diagnosis not present

## 2018-03-08 DIAGNOSIS — Q245 Malformation of coronary vessels: Secondary | ICD-10-CM | POA: Diagnosis not present

## 2018-03-08 DIAGNOSIS — F419 Anxiety disorder, unspecified: Secondary | ICD-10-CM | POA: Diagnosis not present

## 2018-03-09 DIAGNOSIS — Q245 Malformation of coronary vessels: Secondary | ICD-10-CM | POA: Diagnosis not present

## 2018-03-09 DIAGNOSIS — F419 Anxiety disorder, unspecified: Secondary | ICD-10-CM | POA: Diagnosis not present

## 2018-03-09 DIAGNOSIS — E119 Type 2 diabetes mellitus without complications: Secondary | ICD-10-CM | POA: Diagnosis not present

## 2018-03-10 DIAGNOSIS — F419 Anxiety disorder, unspecified: Secondary | ICD-10-CM | POA: Diagnosis not present

## 2018-03-10 DIAGNOSIS — Q245 Malformation of coronary vessels: Secondary | ICD-10-CM | POA: Diagnosis not present

## 2018-03-10 DIAGNOSIS — E119 Type 2 diabetes mellitus without complications: Secondary | ICD-10-CM | POA: Diagnosis not present

## 2018-03-11 DIAGNOSIS — Q245 Malformation of coronary vessels: Secondary | ICD-10-CM | POA: Diagnosis not present

## 2018-03-11 DIAGNOSIS — F419 Anxiety disorder, unspecified: Secondary | ICD-10-CM | POA: Diagnosis not present

## 2018-03-11 DIAGNOSIS — E119 Type 2 diabetes mellitus without complications: Secondary | ICD-10-CM | POA: Diagnosis not present

## 2018-03-12 DIAGNOSIS — E119 Type 2 diabetes mellitus without complications: Secondary | ICD-10-CM | POA: Diagnosis not present

## 2018-03-12 DIAGNOSIS — F419 Anxiety disorder, unspecified: Secondary | ICD-10-CM | POA: Diagnosis not present

## 2018-03-12 DIAGNOSIS — Q245 Malformation of coronary vessels: Secondary | ICD-10-CM | POA: Diagnosis not present

## 2018-03-13 DIAGNOSIS — E119 Type 2 diabetes mellitus without complications: Secondary | ICD-10-CM | POA: Diagnosis not present

## 2018-03-13 DIAGNOSIS — Q245 Malformation of coronary vessels: Secondary | ICD-10-CM | POA: Diagnosis not present

## 2018-03-13 DIAGNOSIS — F419 Anxiety disorder, unspecified: Secondary | ICD-10-CM | POA: Diagnosis not present

## 2018-03-14 DIAGNOSIS — E119 Type 2 diabetes mellitus without complications: Secondary | ICD-10-CM | POA: Diagnosis not present

## 2018-03-14 DIAGNOSIS — Q245 Malformation of coronary vessels: Secondary | ICD-10-CM | POA: Diagnosis not present

## 2018-03-14 DIAGNOSIS — F419 Anxiety disorder, unspecified: Secondary | ICD-10-CM | POA: Diagnosis not present

## 2018-03-15 DIAGNOSIS — Q245 Malformation of coronary vessels: Secondary | ICD-10-CM | POA: Diagnosis not present

## 2018-03-15 DIAGNOSIS — E119 Type 2 diabetes mellitus without complications: Secondary | ICD-10-CM | POA: Diagnosis not present

## 2018-03-15 DIAGNOSIS — F419 Anxiety disorder, unspecified: Secondary | ICD-10-CM | POA: Diagnosis not present

## 2018-03-16 DIAGNOSIS — Q245 Malformation of coronary vessels: Secondary | ICD-10-CM | POA: Diagnosis not present

## 2018-03-16 DIAGNOSIS — E119 Type 2 diabetes mellitus without complications: Secondary | ICD-10-CM | POA: Diagnosis not present

## 2018-03-16 DIAGNOSIS — F419 Anxiety disorder, unspecified: Secondary | ICD-10-CM | POA: Diagnosis not present

## 2018-03-17 DIAGNOSIS — Q245 Malformation of coronary vessels: Secondary | ICD-10-CM | POA: Diagnosis not present

## 2018-03-17 DIAGNOSIS — F419 Anxiety disorder, unspecified: Secondary | ICD-10-CM | POA: Diagnosis not present

## 2018-03-17 DIAGNOSIS — E119 Type 2 diabetes mellitus without complications: Secondary | ICD-10-CM | POA: Diagnosis not present

## 2018-03-18 DIAGNOSIS — E119 Type 2 diabetes mellitus without complications: Secondary | ICD-10-CM | POA: Diagnosis not present

## 2018-03-18 DIAGNOSIS — F419 Anxiety disorder, unspecified: Secondary | ICD-10-CM | POA: Diagnosis not present

## 2018-03-18 DIAGNOSIS — Q245 Malformation of coronary vessels: Secondary | ICD-10-CM | POA: Diagnosis not present

## 2018-03-19 DIAGNOSIS — F419 Anxiety disorder, unspecified: Secondary | ICD-10-CM | POA: Diagnosis not present

## 2018-03-19 DIAGNOSIS — E119 Type 2 diabetes mellitus without complications: Secondary | ICD-10-CM | POA: Diagnosis not present

## 2018-03-19 DIAGNOSIS — Q245 Malformation of coronary vessels: Secondary | ICD-10-CM | POA: Diagnosis not present

## 2018-03-20 DIAGNOSIS — F419 Anxiety disorder, unspecified: Secondary | ICD-10-CM | POA: Diagnosis not present

## 2018-03-20 DIAGNOSIS — E119 Type 2 diabetes mellitus without complications: Secondary | ICD-10-CM | POA: Diagnosis not present

## 2018-03-20 DIAGNOSIS — Q245 Malformation of coronary vessels: Secondary | ICD-10-CM | POA: Diagnosis not present

## 2018-03-21 DIAGNOSIS — F419 Anxiety disorder, unspecified: Secondary | ICD-10-CM | POA: Diagnosis not present

## 2018-03-21 DIAGNOSIS — E119 Type 2 diabetes mellitus without complications: Secondary | ICD-10-CM | POA: Diagnosis not present

## 2018-03-21 DIAGNOSIS — Q245 Malformation of coronary vessels: Secondary | ICD-10-CM | POA: Diagnosis not present

## 2018-03-22 DIAGNOSIS — E119 Type 2 diabetes mellitus without complications: Secondary | ICD-10-CM | POA: Diagnosis not present

## 2018-03-22 DIAGNOSIS — F419 Anxiety disorder, unspecified: Secondary | ICD-10-CM | POA: Diagnosis not present

## 2018-03-22 DIAGNOSIS — Q245 Malformation of coronary vessels: Secondary | ICD-10-CM | POA: Diagnosis not present

## 2018-03-23 DIAGNOSIS — E119 Type 2 diabetes mellitus without complications: Secondary | ICD-10-CM | POA: Diagnosis not present

## 2018-03-23 DIAGNOSIS — F419 Anxiety disorder, unspecified: Secondary | ICD-10-CM | POA: Diagnosis not present

## 2018-03-23 DIAGNOSIS — Q245 Malformation of coronary vessels: Secondary | ICD-10-CM | POA: Diagnosis not present

## 2018-03-24 DIAGNOSIS — Q245 Malformation of coronary vessels: Secondary | ICD-10-CM | POA: Diagnosis not present

## 2018-03-24 DIAGNOSIS — E119 Type 2 diabetes mellitus without complications: Secondary | ICD-10-CM | POA: Diagnosis not present

## 2018-03-24 DIAGNOSIS — F419 Anxiety disorder, unspecified: Secondary | ICD-10-CM | POA: Diagnosis not present

## 2018-03-25 DIAGNOSIS — E119 Type 2 diabetes mellitus without complications: Secondary | ICD-10-CM | POA: Diagnosis not present

## 2018-03-25 DIAGNOSIS — Q245 Malformation of coronary vessels: Secondary | ICD-10-CM | POA: Diagnosis not present

## 2018-03-25 DIAGNOSIS — F419 Anxiety disorder, unspecified: Secondary | ICD-10-CM | POA: Diagnosis not present

## 2018-03-26 DIAGNOSIS — F419 Anxiety disorder, unspecified: Secondary | ICD-10-CM | POA: Diagnosis not present

## 2018-03-26 DIAGNOSIS — E119 Type 2 diabetes mellitus without complications: Secondary | ICD-10-CM | POA: Diagnosis not present

## 2018-03-26 DIAGNOSIS — Q245 Malformation of coronary vessels: Secondary | ICD-10-CM | POA: Diagnosis not present

## 2018-03-27 DIAGNOSIS — Q245 Malformation of coronary vessels: Secondary | ICD-10-CM | POA: Diagnosis not present

## 2018-03-27 DIAGNOSIS — F419 Anxiety disorder, unspecified: Secondary | ICD-10-CM | POA: Diagnosis not present

## 2018-03-27 DIAGNOSIS — E119 Type 2 diabetes mellitus without complications: Secondary | ICD-10-CM | POA: Diagnosis not present

## 2018-03-28 DIAGNOSIS — E119 Type 2 diabetes mellitus without complications: Secondary | ICD-10-CM | POA: Diagnosis not present

## 2018-03-28 DIAGNOSIS — F419 Anxiety disorder, unspecified: Secondary | ICD-10-CM | POA: Diagnosis not present

## 2018-03-28 DIAGNOSIS — Q245 Malformation of coronary vessels: Secondary | ICD-10-CM | POA: Diagnosis not present

## 2018-03-29 DIAGNOSIS — Q245 Malformation of coronary vessels: Secondary | ICD-10-CM | POA: Diagnosis not present

## 2018-03-29 DIAGNOSIS — E119 Type 2 diabetes mellitus without complications: Secondary | ICD-10-CM | POA: Diagnosis not present

## 2018-03-29 DIAGNOSIS — F419 Anxiety disorder, unspecified: Secondary | ICD-10-CM | POA: Diagnosis not present

## 2018-03-30 DIAGNOSIS — Q245 Malformation of coronary vessels: Secondary | ICD-10-CM | POA: Diagnosis not present

## 2018-03-30 DIAGNOSIS — E119 Type 2 diabetes mellitus without complications: Secondary | ICD-10-CM | POA: Diagnosis not present

## 2018-03-30 DIAGNOSIS — F419 Anxiety disorder, unspecified: Secondary | ICD-10-CM | POA: Diagnosis not present

## 2018-03-31 DIAGNOSIS — E119 Type 2 diabetes mellitus without complications: Secondary | ICD-10-CM | POA: Diagnosis not present

## 2018-03-31 DIAGNOSIS — Q245 Malformation of coronary vessels: Secondary | ICD-10-CM | POA: Diagnosis not present

## 2018-03-31 DIAGNOSIS — F419 Anxiety disorder, unspecified: Secondary | ICD-10-CM | POA: Diagnosis not present

## 2018-04-01 DIAGNOSIS — F419 Anxiety disorder, unspecified: Secondary | ICD-10-CM | POA: Diagnosis not present

## 2018-04-01 DIAGNOSIS — Q245 Malformation of coronary vessels: Secondary | ICD-10-CM | POA: Diagnosis not present

## 2018-04-01 DIAGNOSIS — E119 Type 2 diabetes mellitus without complications: Secondary | ICD-10-CM | POA: Diagnosis not present

## 2018-04-02 DIAGNOSIS — F419 Anxiety disorder, unspecified: Secondary | ICD-10-CM | POA: Diagnosis not present

## 2018-04-02 DIAGNOSIS — E119 Type 2 diabetes mellitus without complications: Secondary | ICD-10-CM | POA: Diagnosis not present

## 2018-04-02 DIAGNOSIS — Q245 Malformation of coronary vessels: Secondary | ICD-10-CM | POA: Diagnosis not present

## 2018-04-03 DIAGNOSIS — E119 Type 2 diabetes mellitus without complications: Secondary | ICD-10-CM | POA: Diagnosis not present

## 2018-04-03 DIAGNOSIS — F419 Anxiety disorder, unspecified: Secondary | ICD-10-CM | POA: Diagnosis not present

## 2018-04-03 DIAGNOSIS — Q245 Malformation of coronary vessels: Secondary | ICD-10-CM | POA: Diagnosis not present

## 2018-04-04 DIAGNOSIS — E119 Type 2 diabetes mellitus without complications: Secondary | ICD-10-CM | POA: Diagnosis not present

## 2018-04-04 DIAGNOSIS — F419 Anxiety disorder, unspecified: Secondary | ICD-10-CM | POA: Diagnosis not present

## 2018-04-04 DIAGNOSIS — Q245 Malformation of coronary vessels: Secondary | ICD-10-CM | POA: Diagnosis not present

## 2018-04-05 DIAGNOSIS — F419 Anxiety disorder, unspecified: Secondary | ICD-10-CM | POA: Diagnosis not present

## 2018-04-05 DIAGNOSIS — Q245 Malformation of coronary vessels: Secondary | ICD-10-CM | POA: Diagnosis not present

## 2018-04-05 DIAGNOSIS — E119 Type 2 diabetes mellitus without complications: Secondary | ICD-10-CM | POA: Diagnosis not present

## 2018-04-06 DIAGNOSIS — F419 Anxiety disorder, unspecified: Secondary | ICD-10-CM | POA: Diagnosis not present

## 2018-04-06 DIAGNOSIS — E119 Type 2 diabetes mellitus without complications: Secondary | ICD-10-CM | POA: Diagnosis not present

## 2018-04-06 DIAGNOSIS — Q245 Malformation of coronary vessels: Secondary | ICD-10-CM | POA: Diagnosis not present

## 2018-04-07 DIAGNOSIS — E119 Type 2 diabetes mellitus without complications: Secondary | ICD-10-CM | POA: Diagnosis not present

## 2018-04-07 DIAGNOSIS — F419 Anxiety disorder, unspecified: Secondary | ICD-10-CM | POA: Diagnosis not present

## 2018-04-07 DIAGNOSIS — Q245 Malformation of coronary vessels: Secondary | ICD-10-CM | POA: Diagnosis not present

## 2018-04-08 DIAGNOSIS — Q245 Malformation of coronary vessels: Secondary | ICD-10-CM | POA: Diagnosis not present

## 2018-04-08 DIAGNOSIS — E119 Type 2 diabetes mellitus without complications: Secondary | ICD-10-CM | POA: Diagnosis not present

## 2018-04-08 DIAGNOSIS — F419 Anxiety disorder, unspecified: Secondary | ICD-10-CM | POA: Diagnosis not present

## 2018-04-09 DIAGNOSIS — F419 Anxiety disorder, unspecified: Secondary | ICD-10-CM | POA: Diagnosis not present

## 2018-04-09 DIAGNOSIS — Q245 Malformation of coronary vessels: Secondary | ICD-10-CM | POA: Diagnosis not present

## 2018-04-09 DIAGNOSIS — E119 Type 2 diabetes mellitus without complications: Secondary | ICD-10-CM | POA: Diagnosis not present

## 2018-04-10 DIAGNOSIS — Q245 Malformation of coronary vessels: Secondary | ICD-10-CM | POA: Diagnosis not present

## 2018-04-10 DIAGNOSIS — F419 Anxiety disorder, unspecified: Secondary | ICD-10-CM | POA: Diagnosis not present

## 2018-04-10 DIAGNOSIS — E119 Type 2 diabetes mellitus without complications: Secondary | ICD-10-CM | POA: Diagnosis not present

## 2018-04-11 DIAGNOSIS — Q245 Malformation of coronary vessels: Secondary | ICD-10-CM | POA: Diagnosis not present

## 2018-04-11 DIAGNOSIS — E119 Type 2 diabetes mellitus without complications: Secondary | ICD-10-CM | POA: Diagnosis not present

## 2018-04-11 DIAGNOSIS — F419 Anxiety disorder, unspecified: Secondary | ICD-10-CM | POA: Diagnosis not present

## 2018-04-12 DIAGNOSIS — F419 Anxiety disorder, unspecified: Secondary | ICD-10-CM | POA: Diagnosis not present

## 2018-04-12 DIAGNOSIS — E119 Type 2 diabetes mellitus without complications: Secondary | ICD-10-CM | POA: Diagnosis not present

## 2018-04-12 DIAGNOSIS — Q245 Malformation of coronary vessels: Secondary | ICD-10-CM | POA: Diagnosis not present

## 2018-06-01 DIAGNOSIS — D509 Iron deficiency anemia, unspecified: Secondary | ICD-10-CM | POA: Diagnosis not present

## 2018-06-01 DIAGNOSIS — I10 Essential (primary) hypertension: Secondary | ICD-10-CM | POA: Diagnosis not present

## 2018-06-01 DIAGNOSIS — I739 Peripheral vascular disease, unspecified: Secondary | ICD-10-CM | POA: Diagnosis not present

## 2018-06-01 DIAGNOSIS — N183 Chronic kidney disease, stage 3 (moderate): Secondary | ICD-10-CM | POA: Diagnosis not present

## 2018-06-01 DIAGNOSIS — F331 Major depressive disorder, recurrent, moderate: Secondary | ICD-10-CM | POA: Diagnosis not present

## 2018-06-01 DIAGNOSIS — E1169 Type 2 diabetes mellitus with other specified complication: Secondary | ICD-10-CM | POA: Diagnosis not present

## 2018-06-01 DIAGNOSIS — E782 Mixed hyperlipidemia: Secondary | ICD-10-CM | POA: Diagnosis not present

## 2018-06-01 DIAGNOSIS — I2510 Atherosclerotic heart disease of native coronary artery without angina pectoris: Secondary | ICD-10-CM | POA: Diagnosis not present

## 2018-06-01 DIAGNOSIS — E559 Vitamin D deficiency, unspecified: Secondary | ICD-10-CM | POA: Diagnosis not present

## 2018-07-01 DIAGNOSIS — E782 Mixed hyperlipidemia: Secondary | ICD-10-CM | POA: Diagnosis not present

## 2018-07-01 DIAGNOSIS — I10 Essential (primary) hypertension: Secondary | ICD-10-CM | POA: Diagnosis not present

## 2018-07-01 DIAGNOSIS — N183 Chronic kidney disease, stage 3 (moderate): Secondary | ICD-10-CM | POA: Diagnosis not present

## 2018-07-01 DIAGNOSIS — E1169 Type 2 diabetes mellitus with other specified complication: Secondary | ICD-10-CM | POA: Diagnosis not present

## 2018-07-01 DIAGNOSIS — I739 Peripheral vascular disease, unspecified: Secondary | ICD-10-CM | POA: Diagnosis not present

## 2018-07-01 DIAGNOSIS — D509 Iron deficiency anemia, unspecified: Secondary | ICD-10-CM | POA: Diagnosis not present

## 2018-07-01 DIAGNOSIS — E559 Vitamin D deficiency, unspecified: Secondary | ICD-10-CM | POA: Diagnosis not present

## 2018-07-22 DIAGNOSIS — R1030 Lower abdominal pain, unspecified: Secondary | ICD-10-CM | POA: Diagnosis not present

## 2018-07-31 ENCOUNTER — Inpatient Hospital Stay (HOSPITAL_COMMUNITY)
Admission: AD | Admit: 2018-07-31 | Discharge: 2018-08-07 | DRG: 682 | Disposition: A | Discharge status: Skilled Nursing Facility | Payer: Medicare HMO | Source: Other Acute Inpatient Hospital | Attending: Family Medicine | Admitting: Family Medicine

## 2018-07-31 DIAGNOSIS — Z8673 Personal history of transient ischemic attack (TIA), and cerebral infarction without residual deficits: Secondary | ICD-10-CM

## 2018-07-31 DIAGNOSIS — R278 Other lack of coordination: Secondary | ICD-10-CM | POA: Diagnosis not present

## 2018-07-31 DIAGNOSIS — N179 Acute kidney failure, unspecified: Secondary | ICD-10-CM | POA: Diagnosis not present

## 2018-07-31 DIAGNOSIS — F419 Anxiety disorder, unspecified: Secondary | ICD-10-CM | POA: Diagnosis present

## 2018-07-31 DIAGNOSIS — N189 Chronic kidney disease, unspecified: Secondary | ICD-10-CM | POA: Diagnosis present

## 2018-07-31 DIAGNOSIS — E1151 Type 2 diabetes mellitus with diabetic peripheral angiopathy without gangrene: Secondary | ICD-10-CM | POA: Diagnosis present

## 2018-07-31 DIAGNOSIS — I2510 Atherosclerotic heart disease of native coronary artery without angina pectoris: Secondary | ICD-10-CM | POA: Diagnosis not present

## 2018-07-31 DIAGNOSIS — I739 Peripheral vascular disease, unspecified: Secondary | ICD-10-CM | POA: Diagnosis present

## 2018-07-31 DIAGNOSIS — Z90710 Acquired absence of both cervix and uterus: Secondary | ICD-10-CM

## 2018-07-31 DIAGNOSIS — Z7984 Long term (current) use of oral hypoglycemic drugs: Secondary | ICD-10-CM | POA: Diagnosis not present

## 2018-07-31 DIAGNOSIS — F39 Unspecified mood [affective] disorder: Secondary | ICD-10-CM | POA: Diagnosis not present

## 2018-07-31 DIAGNOSIS — N184 Chronic kidney disease, stage 4 (severe): Secondary | ICD-10-CM | POA: Diagnosis not present

## 2018-07-31 DIAGNOSIS — E1122 Type 2 diabetes mellitus with diabetic chronic kidney disease: Secondary | ICD-10-CM | POA: Diagnosis present

## 2018-07-31 DIAGNOSIS — R439 Unspecified disturbances of smell and taste: Secondary | ICD-10-CM | POA: Diagnosis not present

## 2018-07-31 DIAGNOSIS — Z955 Presence of coronary angioplasty implant and graft: Secondary | ICD-10-CM | POA: Diagnosis not present

## 2018-07-31 DIAGNOSIS — E872 Acidosis: Secondary | ICD-10-CM | POA: Diagnosis not present

## 2018-07-31 DIAGNOSIS — M2550 Pain in unspecified joint: Secondary | ICD-10-CM | POA: Diagnosis not present

## 2018-07-31 DIAGNOSIS — I129 Hypertensive chronic kidney disease with stage 1 through stage 4 chronic kidney disease, or unspecified chronic kidney disease: Secondary | ICD-10-CM | POA: Diagnosis not present

## 2018-07-31 DIAGNOSIS — R531 Weakness: Secondary | ICD-10-CM | POA: Diagnosis not present

## 2018-07-31 DIAGNOSIS — E118 Type 2 diabetes mellitus with unspecified complications: Secondary | ICD-10-CM | POA: Diagnosis not present

## 2018-07-31 DIAGNOSIS — G4700 Insomnia, unspecified: Secondary | ICD-10-CM | POA: Diagnosis present

## 2018-07-31 DIAGNOSIS — G4709 Other insomnia: Secondary | ICD-10-CM | POA: Diagnosis not present

## 2018-07-31 DIAGNOSIS — Z79891 Long term (current) use of opiate analgesic: Secondary | ICD-10-CM | POA: Diagnosis not present

## 2018-07-31 DIAGNOSIS — U071 COVID-19: Secondary | ICD-10-CM | POA: Diagnosis present

## 2018-07-31 DIAGNOSIS — R0602 Shortness of breath: Secondary | ICD-10-CM | POA: Diagnosis not present

## 2018-07-31 DIAGNOSIS — R1030 Lower abdominal pain, unspecified: Secondary | ICD-10-CM | POA: Diagnosis not present

## 2018-07-31 DIAGNOSIS — Z791 Long term (current) use of non-steroidal anti-inflammatories (NSAID): Secondary | ICD-10-CM | POA: Diagnosis not present

## 2018-07-31 DIAGNOSIS — E119 Type 2 diabetes mellitus without complications: Secondary | ICD-10-CM | POA: Diagnosis not present

## 2018-07-31 DIAGNOSIS — Z87891 Personal history of nicotine dependence: Secondary | ICD-10-CM | POA: Diagnosis not present

## 2018-07-31 DIAGNOSIS — I252 Old myocardial infarction: Secondary | ICD-10-CM

## 2018-07-31 DIAGNOSIS — R2689 Other abnormalities of gait and mobility: Secondary | ICD-10-CM | POA: Diagnosis not present

## 2018-07-31 DIAGNOSIS — I7389 Other specified peripheral vascular diseases: Secondary | ICD-10-CM | POA: Diagnosis not present

## 2018-07-31 DIAGNOSIS — R41841 Cognitive communication deficit: Secondary | ICD-10-CM | POA: Diagnosis not present

## 2018-07-31 DIAGNOSIS — K219 Gastro-esophageal reflux disease without esophagitis: Secondary | ICD-10-CM | POA: Diagnosis present

## 2018-07-31 DIAGNOSIS — Z7982 Long term (current) use of aspirin: Secondary | ICD-10-CM

## 2018-07-31 DIAGNOSIS — J1289 Other viral pneumonia: Secondary | ICD-10-CM | POA: Diagnosis not present

## 2018-07-31 DIAGNOSIS — R5381 Other malaise: Secondary | ICD-10-CM | POA: Diagnosis not present

## 2018-07-31 DIAGNOSIS — I10 Essential (primary) hypertension: Secondary | ICD-10-CM | POA: Diagnosis not present

## 2018-07-31 DIAGNOSIS — E7800 Pure hypercholesterolemia, unspecified: Secondary | ICD-10-CM | POA: Diagnosis present

## 2018-07-31 DIAGNOSIS — I1310 Hypertensive heart and chronic kidney disease without heart failure, with stage 1 through stage 4 chronic kidney disease, or unspecified chronic kidney disease: Secondary | ICD-10-CM | POA: Diagnosis present

## 2018-07-31 DIAGNOSIS — I959 Hypotension, unspecified: Secondary | ICD-10-CM | POA: Diagnosis not present

## 2018-07-31 DIAGNOSIS — Z7952 Long term (current) use of systemic steroids: Secondary | ICD-10-CM

## 2018-07-31 DIAGNOSIS — M6281 Muscle weakness (generalized): Secondary | ICD-10-CM | POA: Diagnosis not present

## 2018-07-31 DIAGNOSIS — N178 Other acute kidney failure: Secondary | ICD-10-CM | POA: Diagnosis not present

## 2018-07-31 DIAGNOSIS — Z79899 Other long term (current) drug therapy: Secondary | ICD-10-CM

## 2018-07-31 DIAGNOSIS — Z7401 Bed confinement status: Secondary | ICD-10-CM | POA: Diagnosis not present

## 2018-07-31 DIAGNOSIS — G8929 Other chronic pain: Secondary | ICD-10-CM | POA: Diagnosis present

## 2018-07-31 DIAGNOSIS — E11649 Type 2 diabetes mellitus with hypoglycemia without coma: Secondary | ICD-10-CM | POA: Diagnosis not present

## 2018-07-31 DIAGNOSIS — R2681 Unsteadiness on feet: Secondary | ICD-10-CM | POA: Diagnosis not present

## 2018-07-31 DIAGNOSIS — R197 Diarrhea, unspecified: Secondary | ICD-10-CM | POA: Diagnosis not present

## 2018-08-01 ENCOUNTER — Other Ambulatory Visit: Payer: Self-pay

## 2018-08-01 ENCOUNTER — Encounter (HOSPITAL_COMMUNITY): Payer: Self-pay

## 2018-08-01 DIAGNOSIS — N189 Chronic kidney disease, unspecified: Secondary | ICD-10-CM | POA: Diagnosis present

## 2018-08-01 DIAGNOSIS — N179 Acute kidney failure, unspecified: Secondary | ICD-10-CM

## 2018-08-01 DIAGNOSIS — U071 COVID-19: Secondary | ICD-10-CM | POA: Diagnosis not present

## 2018-08-01 DIAGNOSIS — N184 Chronic kidney disease, stage 4 (severe): Secondary | ICD-10-CM | POA: Diagnosis not present

## 2018-08-01 DIAGNOSIS — I2510 Atherosclerotic heart disease of native coronary artery without angina pectoris: Secondary | ICD-10-CM | POA: Diagnosis not present

## 2018-08-01 DIAGNOSIS — I959 Hypotension, unspecified: Secondary | ICD-10-CM | POA: Diagnosis not present

## 2018-08-01 DIAGNOSIS — E1151 Type 2 diabetes mellitus with diabetic peripheral angiopathy without gangrene: Secondary | ICD-10-CM | POA: Diagnosis not present

## 2018-08-01 DIAGNOSIS — F419 Anxiety disorder, unspecified: Secondary | ICD-10-CM | POA: Diagnosis not present

## 2018-08-01 DIAGNOSIS — E872 Acidosis: Secondary | ICD-10-CM | POA: Diagnosis not present

## 2018-08-01 DIAGNOSIS — E1122 Type 2 diabetes mellitus with diabetic chronic kidney disease: Secondary | ICD-10-CM | POA: Diagnosis not present

## 2018-08-01 HISTORY — DX: Chronic kidney disease, unspecified: N17.9

## 2018-08-01 HISTORY — DX: COVID-19: U07.1

## 2018-08-01 LAB — GLUCOSE, CAPILLARY
Glucose-Capillary: 108 mg/dL — ABNORMAL HIGH (ref 70–99)
Glucose-Capillary: 129 mg/dL — ABNORMAL HIGH (ref 70–99)
Glucose-Capillary: 101 mg/dL — ABNORMAL HIGH (ref 70–99)
Glucose-Capillary: 125 mg/dL — ABNORMAL HIGH (ref 70–99)

## 2018-08-01 LAB — SODIUM, URINE, RANDOM: Sodium, Ur: 132 mmol/L

## 2018-08-01 LAB — COMPREHENSIVE METABOLIC PANEL
ALT: 20 U/L (ref 0–44)
AST: 39 U/L (ref 15–41)
Albumin: 4 g/dL (ref 3.5–5.0)
Alkaline Phosphatase: 48 U/L (ref 38–126)
Anion gap: 11 (ref 5–15)
BUN: 38 mg/dL — ABNORMAL HIGH (ref 8–23)
CO2: 19 mmol/L — ABNORMAL LOW (ref 22–32)
Calcium: 8.9 mg/dL (ref 8.9–10.3)
Chloride: 112 mmol/L — ABNORMAL HIGH (ref 98–111)
Creatinine, Ser: 2.22 mg/dL — ABNORMAL HIGH (ref 0.44–1.00)
GFR calc Af Amer: 26 mL/min — ABNORMAL LOW (ref >60)
GFR calc non Af Amer: 22 mL/min — ABNORMAL LOW (ref >60)
Glucose, Bld: 142 mg/dL — ABNORMAL HIGH (ref 70–99)
Potassium: 3.7 mmol/L (ref 3.5–5.1)
Sodium: 142 mmol/L (ref 135–145)
Total Bilirubin: 0.4 mg/dL (ref 0.3–1.2)
Total Protein: 7.6 g/dL (ref 6.5–8.1)

## 2018-08-01 LAB — PROTEIN / CREATININE RATIO, URINE
Creatinine, Urine: 57.44 mg/dL
Protein Creatinine Ratio: 1.95 mg/mg{Cre} — ABNORMAL HIGH (ref 0.00–0.15)
Total Protein, Urine: 112 mg/dL

## 2018-08-01 LAB — URINALYSIS, ROUTINE W REFLEX MICROSCOPIC
Bilirubin Urine: NEGATIVE
Glucose, UA: NEGATIVE mg/dL
Ketones, ur: NEGATIVE mg/dL
Nitrite: NEGATIVE
Protein, ur: 100 mg/dL — AB
Specific Gravity, Urine: 1.011 (ref 1.005–1.030)
pH: 5 (ref 5.0–8.0)

## 2018-08-01 LAB — ABO/RH: ABO/RH(D): O POS

## 2018-08-01 LAB — HIV ANTIBODY (ROUTINE TESTING W REFLEX): HIV Screen 4th Generation wRfx: NONREACTIVE

## 2018-08-01 LAB — CREATININE, URINE, RANDOM: Creatinine, Urine: 57.54 mg/dL

## 2018-08-01 MED ORDER — HEPARIN SODIUM (PORCINE) 5000 UNIT/ML IJ SOLN
5000.0000 [IU] | Freq: Three times a day (TID) | INTRAMUSCULAR | Status: DC
  Administered 2018-08-01 – 2018-08-02 (×4): 5000 [IU] via SUBCUTANEOUS
  Filled 2018-08-01 (×4): qty 1

## 2018-08-01 MED ORDER — ALPRAZOLAM 0.5 MG PO TABS
0.2500 mg | ORAL_TABLET | Freq: Every day | ORAL | Status: DC | PRN
  Administered 2018-08-05: 0.25 mg via ORAL
  Filled 2018-08-01 (×2): qty 1

## 2018-08-01 MED ORDER — INSULIN ASPART 100 UNIT/ML ~~LOC~~ SOLN
0.0000 [IU] | Freq: Three times a day (TID) | SUBCUTANEOUS | Status: DC
  Administered 2018-08-03 – 2018-08-05 (×2): 1 [IU] via SUBCUTANEOUS

## 2018-08-01 MED ORDER — PANTOPRAZOLE SODIUM 40 MG PO TBEC
40.0000 mg | DELAYED_RELEASE_TABLET | Freq: Every day | ORAL | Status: DC
  Administered 2018-08-01 – 2018-08-07 (×7): 40 mg via ORAL
  Filled 2018-08-01 (×7): qty 1

## 2018-08-01 MED ORDER — ONDANSETRON HCL 4 MG/2ML IJ SOLN: 4.0000 mg | Freq: Four times a day (QID) | INTRAMUSCULAR | Status: DC | PRN

## 2018-08-01 MED ORDER — ASPIRIN EC 81 MG PO TBEC
81.0000 mg | DELAYED_RELEASE_TABLET | Freq: Every day | ORAL | Status: DC
  Administered 2018-08-01 – 2018-08-07 (×7): 81 mg via ORAL
  Filled 2018-08-01 (×7): qty 1

## 2018-08-01 MED ORDER — ZOLPIDEM TARTRATE 5 MG PO TABS
5.0000 mg | ORAL_TABLET | Freq: Every day | ORAL | Status: DC
  Administered 2018-08-01 – 2018-08-06 (×6): 5 mg via ORAL
  Filled 2018-08-01 (×7): qty 1

## 2018-08-01 MED ORDER — AMLODIPINE BESYLATE 5 MG PO TABS
5.0000 mg | ORAL_TABLET | Freq: Every day | ORAL | Status: DC
  Administered 2018-08-01 – 2018-08-02 (×2): 5 mg via ORAL
  Filled 2018-08-01 (×2): qty 1

## 2018-08-01 MED ORDER — HYDROCODONE-ACETAMINOPHEN 5-325 MG PO TABS
1.0000 | ORAL_TABLET | Freq: Four times a day (QID) | ORAL | Status: DC | PRN
  Administered 2018-08-05 (×2): 2 via ORAL
  Filled 2018-08-01 (×2): qty 2

## 2018-08-01 MED ORDER — INSULIN ASPART 100 UNIT/ML ~~LOC~~ SOLN: 0.0000 [IU] | Freq: Every day | SUBCUTANEOUS | Status: DC

## 2018-08-01 MED ORDER — PRAVASTATIN SODIUM 40 MG PO TABS
40.0000 mg | ORAL_TABLET | Freq: Every day | ORAL | Status: DC
  Administered 2018-08-01 – 2018-08-06 (×6): 40 mg via ORAL
  Filled 2018-08-01 (×7): qty 1

## 2018-08-01 MED ORDER — ACETAMINOPHEN 325 MG PO TABS
650.0000 mg | ORAL_TABLET | Freq: Four times a day (QID) | ORAL | Status: DC | PRN
  Administered 2018-08-01 – 2018-08-04 (×5): 650 mg via ORAL
  Filled 2018-08-01 (×5): qty 2

## 2018-08-01 MED ORDER — ONDANSETRON HCL 4 MG PO TABS: 4.0000 mg | ORAL_TABLET | Freq: Four times a day (QID) | ORAL | Status: DC | PRN

## 2018-08-01 MED ORDER — TIZANIDINE HCL 2 MG PO TABS
4.0000 mg | ORAL_TABLET | Freq: Every evening | ORAL | Status: DC | PRN
  Filled 2018-08-01: qty 1

## 2018-08-01 MED ORDER — LACTATED RINGERS IV SOLN
INTRAVENOUS | Status: DC
  Administered 2018-08-01 – 2018-08-02 (×2): via INTRAVENOUS

## 2018-08-01 MED ORDER — SODIUM CHLORIDE 0.9 % IV SOLN
INTRAVENOUS | Status: DC
  Administered 2018-08-01: 06:00:00 via INTRAVENOUS

## 2018-08-01 NOTE — Progress Notes (Signed)
  PT Cancellation Note  Patient Details Name: Joanne Morris MRN: [id number] DOB: [birth year]   Cancelled Treatment:    Reason Eval/Treat Not Completed: Fatigue/not feeling well, temp.  limiting ability to participate. RN recommends start tomorrow. PT will return 6/22. OT may be able to see patient 6/21  [phone] Claretha Cooper 08/01/2018, 3:35 PM

## 2018-08-01 NOTE — Progress Notes (Signed)
Ice packs applied

## 2018-08-01 NOTE — Progress Notes (Signed)
Called to update pt's daughter, Chander. Gave update on pt with regards to plan of care. Requested to callif any changes.

## 2018-08-01 NOTE — Progress Notes (Signed)
PROGRESS NOTE    Joanne Morris  EHM:[id number] DOB: [birth year] DOA: 07/31/2018 PCP: [name], NP      Brief Narrative:  [name] is a 68 y.o. F with CAD, DM, CKD baseline 2.4 who presented with 1 week malaise, cramps, diarrhea, fatigue.  In ER, hypotensive to 60s/40s.  Found to have creatinine 5.7, given fluids and transferred to [facility] for AKI on CKD.  No hypoxia.  CXR clear.    Assessment & Plan:  Acute renal failure on CKD IV Non-anion gap metabolic acidosis Hypotensive at admission.  BP improved with fluids. Baseline Cr 2.4, but was up to 5.7 at OSH.  Acidosis due to renal failure.   -Continue IV fluids -Hold nephrotoxins -Check UA and Na/Cr and P/C ratio -Trend Cr, K, bicarb    Hypotension Resolved with fluids.  No signs of infection.  Coronavirus infection without acute hypoxic respiratory failure In setting of ongoing 2020 COVID-19 pandemic. -VTE PPx with heparin     Hypertension Coronary disease secondary prevention Peripheral vascular disease secondary prevention BP here slightly elevated. -Hold antihypertensives, hydralazine, metoprolol, valsartan, HCTZ -Resume amlodipine -Continue aspirin, pravastatin  Diabetes Glucose here okay -Continue SSI -Hold home metformin  Other medications -Continue pantoprazole -Hold home Xanax      MDM and disposition: The below labs and imaging reports were reviewed and summarized above.  Medication management as above.  The patient was admitted with acute renal failure and COVID.    This is a no charge note, for further details, please see H&P by Dr. Sloan Leiter.      DVT prophylaxis: Heparin Code Status: FULL Family Communication: Daughter by phone    Consultants:   None  Procedures:   None  Antimicrobials:   None   Culture data:   None      Subjective: Feeling tired, "hot".  No chest pain, no dyspnea, no cough.  Malaise is presnet, and slightly slowed  psychomotor responses.  Objective: Vitals:   08/01/18 0500 08/01/18 0512 08/01/18 0740  BP: (!) 147/74  (!) 147/80  Pulse: (!) 115  (!) 112  Resp: (!) 24  (!) 21  Temp:  98.7 F (37.1 C) 99.4 F (37.4 C)  TempSrc:  Oral Oral  SpO2: 94%  95%  Weight: 59 kg    Height: 5\' 1"  (1.549 m)      Intake/Output Summary (Last 24 hours) at 08/01/2018 0842 Last data filed at 08/01/2018 0630 Gross per 24 hour  Intake 42.03 ml  Output -  Net 42.03 ml   Filed Weights   08/01/18 0500  Weight: 59 kg    Examination: General appearance:  adult female, alert and in no acute distress.  But slowed HEENT: Anicteric, conjunctiva pink, lids and lashes normal. No nasal deformity, discharge, epistaxis.  Lips moist, dentition poor, OP dry, no oral lesions.   Skin: Warm and dry.   No suspicious rashes or lesions. Cardiac: Tachycardic, nl S1-S2, no murmurs appreciated.  Capillary refill is brisk.  JVP not visible.  No LE edema.  Radial pulses 2+ and symmetric. Respiratory: Tachypneic, shallow, no rales or wheezes. Abdomen: Abdomen soft.  No TTP. No ascites, distension, hepatosplenomegaly.   MSK: No deformities or effusions. Neuro: Awake but with psychomotor slowing.  EOMI, moves all extremities with symmetric weakness. Speech fluent.    Psych: Sensorium intact and responding to questions, attention diminshed. Affect blunted.  Judgment and insight appear normal.     Data Reviewed: I have personally reviewed following labs and  imaging studies:  CBC: No results for input(s): WBC, NEUTROABS, HGB, HCT, MCV, PLT in the last 168 hours. Basic Metabolic Panel: Recent Labs  Lab 08/01/18 0547  NA 142  K 3.7  CL 112*  CO2 19*  GLUCOSE 142*  BUN 38*  CREATININE 2.22*  CALCIUM 8.9   GFR: Estimated Creatinine Clearance: 20.3 mL/min (A) (by C-G formula based on SCr of 2.22 mg/dL (H)). Liver Function Tests: Recent Labs  Lab 08/01/18 0547  AST 39  ALT 20  ALKPHOS 48  BILITOT 0.4  PROT 7.6  ALBUMIN  4.0   No results for input(s): LIPASE, AMYLASE in the last 168 hours. No results for input(s): AMMONIA in the last 168 hours. Coagulation Profile: No results for input(s): INR, PROTIME in the last 168 hours. Cardiac Enzymes: No results for input(s): CKTOTAL, CKMB, CKMBINDEX, TROPONINI in the last 168 hours. BNP (last 3 results) No results for input(s): PROBNP in the last 8760 hours. HbA1C: No results for input(s): HGBA1C in the last 72 hours. CBG: Recent Labs  Lab 08/01/18 0738  GLUCAP 108*   Lipid Profile: No results for input(s): CHOL, HDL, LDLCALC, TRIG, CHOLHDL, LDLDIRECT in the last 72 hours. Thyroid Function Tests: No results for input(s): TSH, T4TOTAL, FREET4, T3FREE, THYROIDAB in the last 72 hours. Anemia Panel: No results for input(s): VITAMINB12, FOLATE, FERRITIN, TIBC, IRON, RETICCTPCT in the last 72 hours. Urine analysis:    Component Value Date/Time   COLORURINE YELLOW 04/06/2014 [place] 04/06/2014 0834   LABSPEC 1.017 04/06/2014 0834   PHURINE 6.0 04/06/2014 0834   GLUCOSEU NEGATIVE 04/06/2014 0834   HGBUR SMALL (A) 04/06/2014 0834   BILIRUBINUR NEGATIVE 04/06/2014 0834   KETONESUR NEGATIVE 04/06/2014 0834   PROTEINUR NEGATIVE 04/06/2014 0834   UROBILINOGEN 0.2 04/06/2014 0834   NITRITE NEGATIVE 04/06/2014 0834   LEUKOCYTESUR NEGATIVE 04/06/2014 0834   Sepsis Labs: @LABRCNTIP (procalcitonin:4,lacticacidven:4)  )No results found for this or any previous visit (from the past 240 hour(s)).       Radiology Studies: No results found.      Scheduled Meds: . aspirin EC  81 mg Oral Daily  . heparin  5,000 Units Subcutaneous Q8H  . insulin aspart  0-5 Units Subcutaneous QHS  . insulin aspart  0-9 Units Subcutaneous TID WC  . pantoprazole  40 mg Oral Daily  . zolpidem  5 mg Oral QHS   Continuous Infusions: . sodium chloride 100 mL/hr at 08/01/18 0630     LOS: 1 day    Time spent: 25 minutes      Edwin Dada, MD  [facility] 08/01/2018, 8:42 AM     Please page through [place]:  [email or web address] Password [email or web address] If 7PM-7AM, please contact night-coverage

## 2018-08-01 NOTE — H&P (Signed)
History and Physical    Lenea Bywater VEH:[id number] DOB: February 18, 1950 DOA: 07/31/2018  PCP: [name], NP  Patient coming from: Home via [facility] emergency room.  I have personally briefly reviewed patient's old medical records available.   Chief Complaint: Lower abdominal pain and diarrhea and dizziness.  HPI: Joanne Morris is a 68 y.o. female with medical history significant of type 2 diabetes on glipizide, coronary artery disease on aspirin beta-blockers and a statin, diabetic kidney disease stage III with baseline creatinine about 2.4, hypertension who presented to the [facility] emergency room with about 1 week of not feeling well, right lower abdominal pain cramping and 3 days of diarrhea, fatigue and loss of smell.  According to the patient, she lives by herself.  She has many friends with positive virus test.  Pain is described as moderate in intensity, diffuse along the lower abdomen, no radiation, relieved with bowel movement.  Currently pain-free.  Diarrhea is 2 or 3 episodes, loose watery, associated with abdominal cramping since last 3 days.  Patient also complained of low-grade fever.  Denies any headache, nausea or vomiting.  She is drinking well.  She thinks he is urinating normally. ED Course: Initial blood pressure 68/32 and responded to 3 L of normal saline with improvement of blood pressure.  Heart rate and respiratory rate is normal.  Patient is on room air and oxygenating 98%.  Patient denies any cough or shortness of breath. WBC 5.8, hemoglobin 9.8, hematocrit 30.4, platelets 274.  Sodium 133, potassium 5.3, bicarb 18, BUN 59, creatinine 5.7 D-dimer 1947, ferritin 704.  COVID-19 positive. Patient received 3 L of normal saline in the emergency room before transfer. Her blood pressures are fairly stable on arrival to the hospital.  Patient is not having any active abdominal pain now.  Review of Systems: As per HPI otherwise 10 point review of systems negative.    Past  Medical History:  Diagnosis Date  . Angina   . Anxiety   . Arthritis    "knees" (07/20/2013)  . Carotid stenosis   . Carotid stenosis   . Claudication ([place])   . Coronary artery disease   . Depression   . GERD (gastroesophageal reflux disease)   . High cholesterol   . Hypertension   . Myocardial infarction ([place]) 1990's   "1"  . PAD (peripheral artery disease) ([place])   . Stroke [facility])    "mini stroke 1st then regular stroke", denies residual on 07/20/2013  . Type II diabetes mellitus ([place])   . Wears glasses     Past Surgical History:  Procedure Laterality Date  . ABDOMINAL HYSTERECTOMY    . ATHERECTOMY  10/05/2013  . BALLOON ANGIOPLASTY, ARTERY  10/05/2013   DR [name]  . CAROTID ENDARTERECTOMY    . CORONARY ANGIOPLASTY WITH STENT PLACEMENT     "1"  . ENDARTERECTOMY Left 02/07/2014   Procedure: ENDARTERECTOMY CAROTID;  Surgeon: Elam Dutch, MD;  Location: [place];  Service: Vascular;  Laterality: Left;  . ENDARTERECTOMY Right 04/18/2014   Procedure: ENDARTERECTOMY RIGHT CAROTID;  Surgeon: Elam Dutch, MD;  Location: [place];  Service: Vascular;  Laterality: Right;  . ENDARTERECTOMY N/A 04/24/2014   Procedure: IRRIGATION AND DEBRIDEMENT OF RIGHT NECK ;  Surgeon: Elam Dutch, MD;  Location: [place];  Service: Vascular;  Laterality: N/A;  . ESOPHAGOGASTRODUODENOSCOPY N/A 10/13/2012   Procedure: ESOPHAGOGASTRODUODENOSCOPY (EGD);  Surgeon: Gatha Mayer, MD;  Location: [facility] ENDOSCOPY;  Service: Endoscopy;  Laterality: N/A;  . KENALOG INJECTION Bilateral 08/22/2016  Procedure: KENALOG INJECTION BILATERAL NECK;  Surgeon: Wallace Going, DO;  Location: [place];  Service: Plastics;  Laterality: Bilateral;  . LOWER EXTREMITY ANGIOGRAM  07/20/2013   Unsuccessful attempt at crossing the CTO/notes 07/20/2013  . LOWER EXTREMITY ANGIOGRAM N/A 07/06/2013   Procedure: LOWER EXTREMITY ANGIOGRAM;  Surgeon: Laverda Page, MD;  Location: [facility] CATH LAB;  Service: Cardiovascular;   Laterality: N/A;  . LOWER EXTREMITY ANGIOGRAM N/A 07/20/2013   Procedure: LOWER EXTREMITY ANGIOGRAM;  Surgeon: Laverda Page, MD;  Location: [facility] CATH LAB;  Service: Cardiovascular;  Laterality: N/A;  . LOWER EXTREMITY ANGIOGRAM N/A 10/05/2013   Procedure: LOWER EXTREMITY ANGIOGRAM;  Surgeon: Laverda Page, MD;  Location: [facility] CATH LAB;  Service: Cardiovascular;  Laterality: N/A;  . MASS EXCISION Right 08/22/2016   Procedure: EXCISION RIGHT NECK KELOID;  Surgeon: Wallace Going, DO;  Location: [place];  Service: Plastics;  Laterality: Right;  . PATCH ANGIOPLASTY Left 02/07/2014   Procedure: PATCH ANGIOPLASTY Carotid;  Surgeon: Elam Dutch, MD;  Location: [place];  Service: Vascular;  Laterality: Left;  . PATCH ANGIOPLASTY Right 04/18/2014   Procedure: PATCH ANGIOPLASTY USING HEMASHIELD 0.8cmx 7.6cm PATCH;  Surgeon: Elam Dutch, MD;  Location: [place];  Service: Vascular;  Laterality: Right;  . PERIPHERAL VASCULAR CATHETERIZATION N/A 07/05/2014   Procedure: Lower Extremity Angiography;  Surgeon: [name], MD;  Location: [place] CV LAB;  Service: Cardiovascular;  Laterality: N/A;  . TUBAL LIGATION       reports that she quit smoking about 9 years ago. Her smoking use included cigarettes. She has a 2.00 pack-year smoking history. She has never used smokeless tobacco. She reports that she does not drink alcohol or use drugs.  Allergies  Allergen Reactions  . No Known Allergies     History reviewed. No pertinent family history.   Prior to Admission medications   Medication Sig Start Date End Date Taking? Authorizing Provider  ALPRAZolam [name]) 0.25 MG tablet Take 0.25 mg by mouth daily as needed for anxiety.    [provider]  amLODipine (NORVASC) 10 MG tablet Take 10 mg by mouth daily. 09/22/14   [provider]  aspirin EC 81 MG tablet Take 81 mg by mouth daily.    [provider]  ferrous sulfate 325 (65 FE) MG EC tablet Take  325 mg by mouth daily with breakfast.    [provider]  hydrALAZINE (APRESOLINE) 50 MG tablet Take 50 mg by mouth 3 (three) times daily. 09/22/14   [provider]  HYDROcodone-acetaminophen (NORCO) 5-325 MG tablet Take 1-2 tablets by mouth every 6 (six) hours as needed. 11/07/17   Veryl Speak, MD  meloxicam (MOBIC) 7.5 MG tablet Take 7.5 mg by mouth daily as needed for pain.  05/14/13   [provider]  metFORMIN (GLUCOPHAGE) 500 MG tablet Take 1 tablet (500 mg total) by mouth daily with breakfast. 07/05/14   [name], MD  metoprolol (LOPRESSOR) 100 MG tablet Take 100 mg by mouth 2 (two) times daily. 03/06/16   [provider]  niacin (NIASPAN) 1000 MG CR tablet Take 1,000 mg by mouth daily.  12/21/13   [provider]  nitroGLYCERIN (NITROSTAT) 0.4 MG SL tablet Place 0.4 mg under the tongue every 5 (five) minutes as needed for chest pain.     [provider]  omeprazole (PRILOSEC) 20 MG capsule Take 20 mg by mouth daily.    [provider]  pravastatin (PRAVACHOL) 40  MG tablet Take 40 mg by mouth daily.    [provider]  predniSONE (DELTASONE) 10 MG tablet Take 2 tablets (20 mg total) by mouth 2 (two) times daily with a meal. 11/07/17   Veryl Speak, MD  tiZANidine (ZANAFLEX) 4 MG tablet Take 4 mg by mouth at bedtime as needed for muscle spasms.  03/11/14   [provider]  valsartan-hydrochlorothiazide (DIOVAN-HCT) 320-25 MG per tablet Take 1 tablet by mouth daily.  06/24/14   [provider]  zolpidem (AMBIEN) 10 MG tablet Take 10 mg by mouth at bedtime.  06/17/14   [provider]    Physical Exam: Vitals:   08/01/18 0500 08/01/18 0512  BP: (!) 147/74   Pulse: (!) 115   Resp: (!) 24   Temp:  98.7 F (37.1 C)  TempSrc:  Oral  SpO2: 94%   Weight: 59 kg   Height: 5\' 1"  (1.549 m)     Constitutional: NAD, calm, comfortable Vitals:   08/01/18 0500 08/01/18 0512  BP: (!) 147/74   Pulse:  (!) 115   Resp: (!) 24   Temp:  98.7 F (37.1 C)  TempSrc:  Oral  SpO2: 94%   Weight: 59 kg   Height: 5\' 1"  (1.549 m)    Eyes: PERRL, lids and conjunctivae normal ENMT: Mucous membranes are moist. Posterior pharynx clear of any exudate or lesions.Normal dentition.  Neck: normal, supple, no masses, no thyromegaly Respiratory: clear to auscultation bilaterally, no wheezing, no crackles. Normal respiratory effort. No accessory muscle use.  Cardiovascular: Regular rate and rhythm, no murmurs / rubs / gallops. No extremity edema. 2+ pedal pulses. No carotid bruits.  Abdomen: no tenderness, no masses palpated. No hepatosplenomegaly. Bowel sounds positive.  Obese.  No rigidity or guarding. Musculoskeletal: no clubbing / cyanosis. No joint deformity upper and lower extremities. Good ROM, no contractures. Normal muscle tone.  Skin: no rashes, lesions, ulcers. No induration Neurologic: CN 2-12 grossly intact. Sensation intact, DTR normal. Strength 5/5 in all 4.  Psychiatric: Normal judgment and insight. Alert and oriented x 3.  Anxious.    Labs on Admission: I have personally reviewed following labs and imaging studies  CBC: No results for input(s): WBC, NEUTROABS, HGB, HCT, MCV, PLT in the last 168 hours. Basic Metabolic Panel: No results for input(s): NA, K, CL, CO2, GLUCOSE, BUN, CREATININE, CALCIUM, MG, PHOS in the last 168 hours. GFR: CrCl cannot be calculated (Patient's most recent lab result is older than the maximum 21 days allowed.). Liver Function Tests: No results for input(s): AST, ALT, ALKPHOS, BILITOT, PROT, ALBUMIN in the last 168 hours. No results for input(s): LIPASE, AMYLASE in the last 168 hours. No results for input(s): AMMONIA in the last 168 hours. Coagulation Profile: No results for input(s): INR, PROTIME in the last 168 hours. Cardiac Enzymes: No results for input(s): CKTOTAL, CKMB, CKMBINDEX, TROPONINI in the last 168 hours. BNP (last 3 results) No results for  input(s): PROBNP in the last 8760 hours. HbA1C: No results for input(s): HGBA1C in the last 72 hours. CBG: No results for input(s): GLUCAP in the last 168 hours. Lipid Profile: No results for input(s): CHOL, HDL, LDLCALC, TRIG, CHOLHDL, LDLDIRECT in the last 72 hours. Thyroid Function Tests: No results for input(s): TSH, T4TOTAL, FREET4, T3FREE, THYROIDAB in the last 72 hours. Anemia Panel: No results for input(s): VITAMINB12, FOLATE, FERRITIN, TIBC, IRON, RETICCTPCT in the last 72 hours. Urine analysis:    Component Value Date/Time   COLORURINE YELLOW 04/06/2014 0834  APPEARANCEUR CLEAR 04/06/2014 0834   LABSPEC 1.017 04/06/2014 0834   PHURINE 6.0 04/06/2014 0834   GLUCOSEU NEGATIVE 04/06/2014 0834   HGBUR SMALL (A) 04/06/2014 0834   BILIRUBINUR NEGATIVE 04/06/2014 0834   KETONESUR NEGATIVE 04/06/2014 0834   PROTEINUR NEGATIVE 04/06/2014 0834   UROBILINOGEN 0.2 04/06/2014 0834   NITRITE NEGATIVE 04/06/2014 0834   LEUKOCYTESUR NEGATIVE 04/06/2014 0834    Radiological Exams on Admission: No results found.   Assessment/Plan Principal Problem:   Acute kidney injury superimposed on CKD ([place]) Active Problems:   CAD (coronary artery disease)   HTN (hypertension)   DM2 (diabetes mellitus, type 2) ([place])   Peripheral arterial disease ([place])   COVID-19 virus infection     1.  Acute kidney injury superimposed on CKD stage III: Probably prerenal. Agree with admission given severity of symptoms. Blood pressure responded to IV fluid resuscitation.  Continue maintenance IV fluid.  Intake and output monitoring.  Repeat renal function test now and in the morning.  Since patient is clinically improving, may not need any renal imaging.  Will follow chemistry from morning. Holding all antihypertensives. Holding all NSAID use including Mobic.   2.  COVID-19 virus infection with GI symptoms including diarrhea: Clinically improving.  Will allow regular diet.  Isolation precautions.  3.   Hypertension: Hypotensive on arrival.  Continue resuscitation.  Hold all antihypertensives.  4.  Type 2 diabetes: Recently patient is on glipizide.  Holding glipizide, start on sliding scale insulin.  5.  Coronary artery disease: Denies any chest pain.  On aspirin that she will continue.    6. Peripheral arterial disease: On cilostazol.  Hold.  DVT prophylaxis: Heparin subcu Code Status: Full code Family Communication: None Disposition Plan: Home after hospitalization Consults called: None Admission status: Inpatient telemetry   Barb Merino MD [facility] Pager [phone]  If 7PM-7AM, please contact night-coverage [email or web address] Password [facility]  08/01/2018, 6:23 AM

## 2018-08-01 NOTE — Progress Notes (Addendum)
MD at bedside. Temp 100.1 F oral. Md states not to give tylenol until 101. Pt aware of this plan. NT notified to check temp again in an hour. MD aware of pt's HR in 113-120.

## 2018-08-02 DIAGNOSIS — N189 Chronic kidney disease, unspecified: Secondary | ICD-10-CM

## 2018-08-02 DIAGNOSIS — N179 Acute kidney failure, unspecified: Principal | ICD-10-CM

## 2018-08-02 LAB — CBC WITH DIFFERENTIAL/PLATELET
Abs Immature Granulocytes: 0.04 10*3/uL (ref 0.00–0.07)
Basophils Absolute: 0 10*3/uL (ref 0.0–0.1)
Basophils Relative: 0 %
Eosinophils Absolute: 0 10*3/uL (ref 0.0–0.5)
Eosinophils Relative: 0 %
HCT: 31.6 % — ABNORMAL LOW (ref 36.0–46.0)
Hemoglobin: 9.9 g/dL — ABNORMAL LOW (ref 12.0–15.0)
Immature Granulocytes: 1 %
Lymphocytes Relative: 26 %
Lymphs Abs: 1.5 10*3/uL (ref 0.7–4.0)
MCH: 23.8 pg — ABNORMAL LOW (ref 26.0–34.0)
MCHC: 31.3 g/dL (ref 30.0–36.0)
MCV: 76 fL — ABNORMAL LOW (ref 80.0–100.0)
Monocytes Absolute: 0.2 10*3/uL (ref 0.1–1.0)
Monocytes Relative: 3 %
Neutro Abs: 4 10*3/uL (ref 1.7–7.7)
Neutrophils Relative %: 70 %
Platelets: 310 10*3/uL (ref 150–400)
RBC: 4.16 MIL/uL (ref 3.87–5.11)
RDW: 15.9 % — ABNORMAL HIGH (ref 11.5–15.5)
WBC: 5.7 10*3/uL (ref 4.0–10.5)
nRBC: 0 % (ref 0.0–0.2)

## 2018-08-02 LAB — COMPREHENSIVE METABOLIC PANEL
ALT: 27 U/L (ref 0–44)
AST: 53 U/L — ABNORMAL HIGH (ref 15–41)
Albumin: 3.8 g/dL (ref 3.5–5.0)
Alkaline Phosphatase: 43 U/L (ref 38–126)
Anion gap: 11 (ref 5–15)
BUN: 17 mg/dL (ref 8–23)
CO2: 19 mmol/L — ABNORMAL LOW (ref 22–32)
Calcium: 8.8 mg/dL — ABNORMAL LOW (ref 8.9–10.3)
Chloride: 110 mmol/L (ref 98–111)
Creatinine, Ser: 1.07 mg/dL — ABNORMAL HIGH (ref 0.44–1.00)
GFR calc Af Amer: >60 mL/min (ref >60)
GFR calc non Af Amer: 54 mL/min — ABNORMAL LOW (ref >60)
Glucose, Bld: 88 mg/dL (ref 70–99)
Potassium: 3.7 mmol/L (ref 3.5–5.1)
Sodium: 140 mmol/L (ref 135–145)
Total Bilirubin: 0.5 mg/dL (ref 0.3–1.2)
Total Protein: 7.1 g/dL (ref 6.5–8.1)

## 2018-08-02 LAB — FERRITIN: Ferritin: 904 ng/mL — ABNORMAL HIGH (ref 11–307)

## 2018-08-02 LAB — GLUCOSE, CAPILLARY
Glucose-Capillary: 61 mg/dL — ABNORMAL LOW (ref 70–99)
Glucose-Capillary: 104 mg/dL — ABNORMAL HIGH (ref 70–99)
Glucose-Capillary: 85 mg/dL (ref 70–99)
Glucose-Capillary: 107 mg/dL — ABNORMAL HIGH (ref 70–99)
Glucose-Capillary: 69 mg/dL — ABNORMAL LOW (ref 70–99)

## 2018-08-02 LAB — C-REACTIVE PROTEIN: CRP: 10 mg/dL — ABNORMAL HIGH (ref <1.0)

## 2018-08-02 LAB — D-DIMER, QUANTITATIVE: D-Dimer, Quant: 1.71 ug/mL-FEU — ABNORMAL HIGH (ref 0.00–0.50)

## 2018-08-02 MED ORDER — IRBESARTAN 150 MG PO TABS: 150.0000 mg | ORAL_TABLET | Freq: Every day | ORAL | Status: DC

## 2018-08-02 MED ORDER — HYDRALAZINE HCL 50 MG PO TABS
50.0000 mg | ORAL_TABLET | Freq: Three times a day (TID) | ORAL | Status: DC
  Administered 2018-08-02 – 2018-08-05 (×8): 50 mg via ORAL
  Filled 2018-08-02 (×9): qty 1

## 2018-08-02 MED ORDER — AMLODIPINE BESYLATE 5 MG PO TABS
5.0000 mg | ORAL_TABLET | Freq: Once | ORAL | Status: AC
  Administered 2018-08-02: 12:00:00 5 mg via ORAL
  Filled 2018-08-02: qty 1

## 2018-08-02 MED ORDER — AMLODIPINE BESYLATE 5 MG PO TABS
10.0000 mg | ORAL_TABLET | Freq: Every day | ORAL | Status: DC
  Administered 2018-08-03 – 2018-08-05 (×3): 10 mg via ORAL
  Filled 2018-08-02 (×3): qty 2

## 2018-08-02 MED ORDER — ENOXAPARIN SODIUM 40 MG/0.4ML ~~LOC~~ SOLN
40.0000 mg | SUBCUTANEOUS | Status: DC
  Administered 2018-08-02 – 2018-08-07 (×6): 40 mg via SUBCUTANEOUS
  Filled 2018-08-02 (×6): qty 0.4

## 2018-08-02 MED ORDER — METOPROLOL TARTRATE 50 MG PO TABS
100.0000 mg | ORAL_TABLET | Freq: Two times a day (BID) | ORAL | Status: DC
  Administered 2018-08-02 – 2018-08-07 (×11): 100 mg via ORAL
  Filled 2018-08-02: qty 4
  Filled 2018-08-02 (×2): qty 2
  Filled 2018-08-02: qty 1
  Filled 2018-08-02 (×2): qty 4
  Filled 2018-08-02: qty 2
  Filled 2018-08-02: qty 4
  Filled 2018-08-02: qty 2
  Filled 2018-08-02 (×3): qty 4
  Filled 2018-08-02 (×2): qty 2
  Filled 2018-08-02: qty 1
  Filled 2018-08-02: qty 4
  Filled 2018-08-02: qty 1
  Filled 2018-08-02 (×4): qty 2

## 2018-08-02 NOTE — Progress Notes (Addendum)
Called pt's daughter. Left voice mail due to no answer.   Daughter returned call back. Update given.

## 2018-08-02 NOTE — Progress Notes (Signed)
Patient rested comfortably overnight. Tmax overnight 102.6. Temp decreased with tylenol and ice packs. Sinus tach on tele, rates 110-120s. Remained on room air. Urine sample sent.

## 2018-08-02 NOTE — Progress Notes (Signed)
PROGRESS NOTE    Nuala Chiles  VZD:[id number] DOB: August 17, 1950 DOA: 07/31/2018 PCP: [name], NP      Brief Narrative:  Mrs. Berland is a 68 y.o. F with CAD, DM, CKD baseline 2.4 who presented with 1 week malaise, cramps, diarrhea, fatigue.  In ER, hypotensive to 60s/40s.  Found to have creatinine 5.7, given fluids and transferred to [facility] for AKI on CKD.  No hypoxia.  CXR clear.    Assessment & Plan:  Acute renal failure on CKD IV Non-anion gap metabolic acidosis Hypotensive at admission.  BP improved with fluids.   Cr 5.7 at OSH on admission.  Baseline Cr unclear, was as low as 1.5 within the last year  Today, improved to 1.07 mg/dL with IV fluids.  Fena >2, UA shows WBCs, few RBCs.  P/C ratio shows albuminuria.  Mild acidosis persists. -Stop IV fluids -Hold nephrotoxins -Trend BMP     Coronavirus infection without acute hypoxic respiratory failure In setting of ongoing 2020 COVID-19 pandemic.  Again febrile to 102.57F overnight, not hypoxic.  CXR showed bilateral infiltrates at OSH report reviewed.  Still tachypneic at rest. -VTE PPx with lovenox   Hypotension Resolved with fluids.  No signs of infection.  Hypertension Coronary disease secondary prevention Peripheral vascular disease secondary prevention BP 120s to 150s. -Hold valsartan, HCTZ -Continue amlodipine, increase back to home dose -Resume hydralazine, metoprolol -Continue aspirin, pravastatin  Diabetes Hypoglycemic this AM. -Continue SSI only as needed -Hold home metformin  Other medications -Continue pantoprazole -Continue home Xanax, Ambien     MDM and disposition: The below labs and imaging reports reviewed and summarized above.  Medication management as above.  The patient was admitted with acute renal failure as well as COVID-19.  She is currently very debilitated, unable to walk or care for self, and a safe discharge plan has not been arranged.  We will  evaluate with PT, continue treating her supportively for her acute kidney injury which is resolving      DVT prophylaxis: Heparin Code Status: FULL Family Communication: will call Daughter by phone    Consultants:   None  Procedures:   None  Antimicrobials:   None   Culture data:   None      Subjective: Feeling better, fever overnight, no chest pain, dyspnea, cough.  Urine output is good.  Still slowed, still achy and tired, very weak.  Objective: Vitals:   08/02/18 0042 08/02/18 0150 08/02/18 0317 08/02/18 0733  BP:   (!) 154/78 128/76  Pulse: (!) 126 (!) 125 (!) 118 (!) 110  Resp: (!) 23 20 (!) 27 (!) 22  Temp: (!) 102.6 F (39.2 C) (!) 102.2 F (39 C) 100.1 F (37.8 C) 99.2 F (37.3 C)  TempSrc: Oral Oral Oral Oral  SpO2: 92% 91% 93% 91%  Weight:      Height:        Intake/Output Summary (Last 24 hours) at 08/02/2018 1159 Last data filed at 08/02/2018 1142 Gross per 24 hour  Intake 1453.45 ml  Output 2000 ml  Net -546.55 ml   Filed Weights   08/01/18 0500  Weight: 59 kg    Examination: General appearance: Adult female, no acute distress, lying in bed, feels very tired. HEENT: Anicteric, conjunctiva pink, lids and lashes normal. No nasal deformity, discharge, epistaxis.  Lips moist, dentition poor, OP dry, no oral lesions.   Skin: Warm and dry.   No suspicious rashes or lesions. Cardiac: Tachycardic, normal S1-S2, no murmurs,  JVP normal, no lower extremity edema Respiratory: Tachypneic, shallow, no rales or wheezes. Abdomen: Abdomen soft, no tenderness to palpation, no ascites or distention. MSK: No deformities or effusions. Neuro: Awake and alert but with some psychomotor slowing.  Extraocular movements intact, moves all extremities with symmetric weakness.  Speech fluent. Psych: Sensorium intact and responding to questions, attention normal, affect blunted, judgment insight appear slightly impaired.     Data Reviewed: I have personally  reviewed following labs and imaging studies:  CBC: Recent Labs  Lab 08/02/18 0250  WBC 5.7  NEUTROABS 4.0  HGB 9.9*  HCT 31.6*  MCV 76.0*  PLT 595   Basic Metabolic Panel: Recent Labs  Lab 08/01/18 0547 08/02/18 0250  NA 142 140  K 3.7 3.7  CL 112* 110  CO2 19* 19*  GLUCOSE 142* 88  BUN 38* 17  CREATININE 2.22* 1.07*  CALCIUM 8.9 8.8*   GFR: Estimated Creatinine Clearance: 42.1 mL/min (A) (by C-G formula based on SCr of 1.07 mg/dL (H)). Liver Function Tests: Recent Labs  Lab 08/01/18 0547 08/02/18 0250  AST 39 53*  ALT 20 27  ALKPHOS 48 43  BILITOT 0.4 0.5  PROT 7.6 7.1  ALBUMIN 4.0 3.8   No results for input(s): LIPASE, AMYLASE in the last 168 hours. No results for input(s): AMMONIA in the last 168 hours. Coagulation Profile: No results for input(s): INR, PROTIME in the last 168 hours. Cardiac Enzymes: No results for input(s): CKTOTAL, CKMB, CKMBINDEX, TROPONINI in the last 168 hours. BNP (last 3 results) No results for input(s): PROBNP in the last 8760 hours. HbA1C: No results for input(s): HGBA1C in the last 72 hours. CBG: Recent Labs  Lab 08/01/18 1713 08/01/18 2023 08/02/18 0725 08/02/18 0806 08/02/18 1134  GLUCAP 101* 125* 61* 104* 85   Lipid Profile: No results for input(s): CHOL, HDL, LDLCALC, TRIG, CHOLHDL, LDLDIRECT in the last 72 hours. Thyroid Function Tests: No results for input(s): TSH, T4TOTAL, FREET4, T3FREE, THYROIDAB in the last 72 hours. Anemia Panel: Recent Labs    08/02/18 0250  FERRITIN 904*   Urine analysis:    Component Value Date/Time   COLORURINE YELLOW 08/01/2018 2243   APPEARANCEUR CLEAR 08/01/2018 2243   LABSPEC 1.011 08/01/2018 2243   PHURINE 5.0 08/01/2018 2243   GLUCOSEU NEGATIVE 08/01/2018 2243   HGBUR SMALL (A) 08/01/2018 2243   BILIRUBINUR NEGATIVE 08/01/2018 2243   [place] 08/01/2018 2243   PROTEINUR 100 (A) 08/01/2018 2243   UROBILINOGEN 0.2 04/06/2014 0834   NITRITE NEGATIVE 08/01/2018  2243   LEUKOCYTESUR LARGE (A) 08/01/2018 2243   Sepsis Labs: @LABRCNTIP (procalcitonin:4,lacticacidven:4)  )No results found for this or any previous visit (from the past 240 hour(s)).       Radiology Studies: No results found.      Scheduled Meds: . [START ON 08/03/2018] amLODipine  10 mg Oral Daily  . amLODipine  5 mg Oral Once  . aspirin EC  81 mg Oral Daily  . enoxaparin (LOVENOX) injection  40 mg Subcutaneous Q24H  . hydrALAZINE  50 mg Oral Q8H  . insulin aspart  0-5 Units Subcutaneous QHS  . insulin aspart  0-9 Units Subcutaneous TID WC  . metoprolol tartrate  100 mg Oral BID  . pantoprazole  40 mg Oral Daily  . pravastatin  40 mg Oral q1800  . zolpidem  5 mg Oral QHS   Continuous Infusions:    LOS: 2 days    Time spent: 25 minutes     Edwin Dada, MD  [facility] 08/02/2018, 11:59 AM     Please page through AMION:  [email or web address] Password [email or web address] If 7PM-7AM, please contact night-coverage

## 2018-08-03 LAB — COMPREHENSIVE METABOLIC PANEL
ALT: 25 U/L (ref 0–44)
AST: 43 U/L — ABNORMAL HIGH (ref 15–41)
Albumin: 3.5 g/dL (ref 3.5–5.0)
Alkaline Phosphatase: 42 U/L (ref 38–126)
Anion gap: 11 (ref 5–15)
BUN: 17 mg/dL (ref 8–23)
CO2: 20 mmol/L — ABNORMAL LOW (ref 22–32)
Calcium: 9 mg/dL (ref 8.9–10.3)
Chloride: 109 mmol/L (ref 98–111)
Creatinine, Ser: 1.07 mg/dL — ABNORMAL HIGH (ref 0.44–1.00)
GFR calc Af Amer: >60 mL/min (ref >60)
GFR calc non Af Amer: 54 mL/min — ABNORMAL LOW (ref >60)
Glucose, Bld: 66 mg/dL — ABNORMAL LOW (ref 70–99)
Potassium: 4.1 mmol/L (ref 3.5–5.1)
Sodium: 140 mmol/L (ref 135–145)
Total Bilirubin: 0.1 mg/dL — ABNORMAL LOW (ref 0.3–1.2)
Total Protein: 7 g/dL (ref 6.5–8.1)

## 2018-08-03 LAB — CBC WITH DIFFERENTIAL/PLATELET
Abs Immature Granulocytes: 0.1 10*3/uL — ABNORMAL HIGH (ref 0.00–0.07)
Basophils Absolute: 0 10*3/uL (ref 0.0–0.1)
Basophils Relative: 0 %
Eosinophils Absolute: 0 10*3/uL (ref 0.0–0.5)
Eosinophils Relative: 0 %
HCT: 32 % — ABNORMAL LOW (ref 36.0–46.0)
Hemoglobin: 10.3 g/dL — ABNORMAL LOW (ref 12.0–15.0)
Immature Granulocytes: 1 %
Lymphocytes Relative: 21 %
Lymphs Abs: 1.8 10*3/uL (ref 0.7–4.0)
MCH: 24.3 pg — ABNORMAL LOW (ref 26.0–34.0)
MCHC: 32.2 g/dL (ref 30.0–36.0)
MCV: 75.7 fL — ABNORMAL LOW (ref 80.0–100.0)
Monocytes Absolute: 0.1 10*3/uL (ref 0.1–1.0)
Monocytes Relative: 1 %
Neutro Abs: 6.3 10*3/uL (ref 1.7–7.7)
Neutrophils Relative %: 77 %
Platelets: 322 10*3/uL (ref 150–400)
RBC: 4.23 MIL/uL (ref 3.87–5.11)
RDW: 15.9 % — ABNORMAL HIGH (ref 11.5–15.5)
WBC: 8.3 10*3/uL (ref 4.0–10.5)
nRBC: 0 % (ref 0.0–0.2)

## 2018-08-03 LAB — D-DIMER, QUANTITATIVE: D-Dimer, Quant: 1.94 ug/mL-FEU — ABNORMAL HIGH (ref 0.00–0.50)

## 2018-08-03 LAB — GLUCOSE, CAPILLARY
Glucose-Capillary: 79 mg/dL (ref 70–99)
Glucose-Capillary: 88 mg/dL (ref 70–99)
Glucose-Capillary: 121 mg/dL — ABNORMAL HIGH (ref 70–99)
Glucose-Capillary: 115 mg/dL — ABNORMAL HIGH (ref 70–99)

## 2018-08-03 LAB — FERRITIN: Ferritin: 873 ng/mL — ABNORMAL HIGH (ref 11–307)

## 2018-08-03 LAB — HEMOGLOBIN A1C
Hgb A1c MFr Bld: 5.8 % — ABNORMAL HIGH (ref 4.8–5.6)
Mean Plasma Glucose: 120 mg/dL

## 2018-08-03 LAB — C-REACTIVE PROTEIN: CRP: 14.6 mg/dL — ABNORMAL HIGH (ref <1.0)

## 2018-08-03 MED ORDER — IRBESARTAN 150 MG PO TABS
150.0000 mg | ORAL_TABLET | Freq: Every day | ORAL | Status: DC
  Filled 2018-08-03 (×2): qty 1

## 2018-08-03 NOTE — Progress Notes (Signed)
PROGRESS NOTE    Suly Vukelich  WGN:[id number] DOB: 03/20/1950 DOA: 07/31/2018 PCP: [name], NP      Brief Narrative:  Mrs. Driscoll is a 68 y.o. F with CAD, DM, CKD baseline 2.4 who presented with 1 week malaise, cramps, diarrhea, fatigue.  In ER, hypotensive to 60s/40s.  Found to have creatinine 5.7, given fluids and transferred to [facility] for AKI on CKD.  No hypoxia.  CXR clear.    Assessment & Plan:  Acute renal failure on CKD IV Non-anion gap metabolic acidosis Hypotensive at admission.  BP improved with fluids.   Cr 5.7 at OSH on admission.  Baseline Cr unclear, was as low as 1.5 within the last year  Now resolved with fluids.  P/C ratio showed albuminuria.  -Resume valsartan today -Trend BMP closely     Coronavirus infection without acute hypoxic respiratory failure In setting of ongoing 2020 COVID-19 pandemic.  CXR showed bilateral infiltrates at OSH report reviewed.  Still very tachypneic at rest, >30 min but without hypoxia.  Febrile again overnight -Continue VTE PPx with lovenox   Hypotension Resolved with fluids.  No signs of infection.  Hypertension Coronary disease secondary prevention Peripheral vascular disease secondary prevention BP 120s to 150s. -Hold HCTZ -Continue amlodipine -Continue  hydralazine, metoprolol -Resume valsartan/irbesartan -Continue aspirin, pravastatin  Diabetes Hypoglycemic again this AM. -Continue SSI only as needed -Hold home metformin  Other medications -Continue pantoprazole -Continue home Xanax, Ambien     MDM and disposition: The below labs and imaging reports reviewed and summarized above.  Medication management as above.  The patient was admitted with acute renal failure as well as COVID-19.  In the standpoint of her renal failure, things have resolved, she is back on her home medication management.  However she is still quite debilitated from her baseline, unable to care for  self, and with even minimal ambulation she is tachypneic to 30 breaths/min and hypoxic to 88% on room air.  At this time, continued inpatient services are reasonable expected given the high likelihood of an adverse outcome including debility or death were she to be discharged prematurely..        DVT prophylaxis: Lovenox Code Status: FULL Family Communication: Daughter    Consultants:   None  Procedures:   None  Antimicrobials:   None   Culture data:   None      Subjective: Fever again overnight.  Tachypneic and tachycardic this morning.  Denies dyspnea, although she is very breathless with minimal exertion.  No sputum.  No confusion.  Objective: Vitals:   08/02/18 1916 08/02/18 2100 08/03/18 0439 08/03/18 0814  BP:  130/68 (!) 144/77 114/64  Pulse:   96 100  Resp:   (!) 22 (!) 21  Temp: 99.2 F (37.3 C) 99.1 F (37.3 C) (!) 101.8 F (38.8 C) 98.8 F (37.1 C)  TempSrc: Oral Oral Oral Oral  SpO2:   91% 91%  Weight:      Height:        Intake/Output Summary (Last 24 hours) at 08/03/2018 1058 Last data filed at 08/02/2018 1700 Gross per 24 hour  Intake 650 ml  Output 750 ml  Net -100 ml   Filed Weights   08/01/18 0500  Weight: 59 kg    Examination: General appearance: Adult female, sitting in recliner, no acute distress HEENT: Anicteric, conjunctiva pink, lids and lashes normal. No nasal deformity, discharge, epistaxis.  Lips moist, dentition poor, OP dry, no oral lesions.  Skin: Warm and dry.   No suspicious rashes or lesions. Cardiac: Tachycardic, regular, no murmurs, JVP normal, no lower extremity edema. Respiratory: Tachypneic at rest, worse with exertion.  No rales or wheezes, lung sounds diminished bilaterally. Abdomen: Abdomen soft, no tenderness to palpation, no ascites or distention. MSK: No deformities or effusions. Neuro: Awake and alert, extraocular movements intact, moves all extremities with symmetric weakness, speech fluent.   Psychomotor slowing noted. Psych: Sensorium intact and responding to questions, attention normal, affect blunted, judgment insight appear slightly impaired.     Data Reviewed: I have personally reviewed following labs and imaging studies:  CBC: Recent Labs  Lab 08/02/18 0250 08/03/18 0243  WBC 5.7 8.3  NEUTROABS 4.0 6.3  HGB 9.9* 10.3*  HCT 31.6* 32.0*  MCV 76.0* 75.7*  PLT 310 226   Basic Metabolic Panel: Recent Labs  Lab 08/01/18 0547 08/02/18 0250 08/03/18 0243  NA 142 140 140  K 3.7 3.7 4.1  CL 112* 110 109  CO2 19* 19* 20*  GLUCOSE 142* 88 66*  BUN 38* 17 17  CREATININE 2.22* 1.07* 1.07*  CALCIUM 8.9 8.8* 9.0   GFR: Estimated Creatinine Clearance: 42.1 mL/min (A) (by C-G formula based on SCr of 1.07 mg/dL (H)). Liver Function Tests: Recent Labs  Lab 08/01/18 0547 08/02/18 0250 08/03/18 0243  AST 39 53* 43*  ALT 20 27 25   ALKPHOS 48 43 42  BILITOT 0.4 0.5 0.1*  PROT 7.6 7.1 7.0  ALBUMIN 4.0 3.8 3.5   No results for input(s): LIPASE, AMYLASE in the last 168 hours. No results for input(s): AMMONIA in the last 168 hours. Coagulation Profile: No results for input(s): INR, PROTIME in the last 168 hours. Cardiac Enzymes: No results for input(s): CKTOTAL, CKMB, CKMBINDEX, TROPONINI in the last 168 hours. BNP (last 3 results) No results for input(s): PROBNP in the last 8760 hours. HbA1C: Recent Labs    08/01/18 0547  HGBA1C 5.8*   CBG: Recent Labs  Lab 08/02/18 0806 08/02/18 1134 08/02/18 1624 08/02/18 2103 08/03/18 0817  GLUCAP 104* 85 107* 69* 79   Lipid Profile: No results for input(s): CHOL, HDL, LDLCALC, TRIG, CHOLHDL, LDLDIRECT in the last 72 hours. Thyroid Function Tests: No results for input(s): TSH, T4TOTAL, FREET4, T3FREE, THYROIDAB in the last 72 hours. Anemia Panel: Recent Labs    08/02/18 0250 08/03/18 0243  FERRITIN 904* 873*   Urine analysis:    Component Value Date/Time   COLORURINE YELLOW 08/01/2018 [place] 08/01/2018 2243   LABSPEC 1.011 08/01/2018 2243   PHURINE 5.0 08/01/2018 2243   GLUCOSEU NEGATIVE 08/01/2018 2243   HGBUR SMALL (A) 08/01/2018 2243   BILIRUBINUR NEGATIVE 08/01/2018 2243   [place] 08/01/2018 2243   PROTEINUR 100 (A) 08/01/2018 2243   UROBILINOGEN 0.2 04/06/2014 0834   NITRITE NEGATIVE 08/01/2018 2243   LEUKOCYTESUR LARGE (A) 08/01/2018 2243   Sepsis Labs: @LABRCNTIP (procalcitonin:4,lacticacidven:4)  )No results found for this or any previous visit (from the past 240 hour(s)).       Radiology Studies: No results found.      Scheduled Meds: . amLODipine  10 mg Oral Daily  . aspirin EC  81 mg Oral Daily  . enoxaparin (LOVENOX) injection  40 mg Subcutaneous Q24H  . hydrALAZINE  50 mg Oral Q8H  . insulin aspart  0-5 Units Subcutaneous QHS  . insulin aspart  0-9 Units Subcutaneous TID WC  . irbesartan  150 mg Oral Daily  . metoprolol tartrate  100 mg Oral  BID  . pantoprazole  40 mg Oral Daily  . pravastatin  40 mg Oral q1800  . zolpidem  5 mg Oral QHS   Continuous Infusions:    LOS: 3 days    Time spent: 25 minutes     Edwin Dada, MD [facility] 08/03/2018, 10:58 AM     Please page through [place]:  [email or web address] Password [email or web address] If 7PM-7AM, please contact night-coverage

## 2018-08-03 NOTE — Evaluation (Addendum)
Physical Therapy Evaluation Patient Details Name: Joanne Morris MRN: [id number] DOB: Apr 10, 1950 Today's Date: 08/03/2018   History of Present Illness  68 yo female presenting with one week of not feeling well, right lower abdominal pain, fatigue, loss of smell, and three days of diarrhea. Pt testing COVID positive. PMH including DM type 2, CAD, diabetic kidney disease stage lll, and HTN.  Clinical Impression  The patient is moving slowly with mobility. Ambulated in room on RA with SaO2 down to 88%, RR 34 and HR 110. Patient iwill have limited asistance . Recommend SNF. Pt admitted with above diagnosis. Pt currently with functional limitations due to the deficits listed below (see PT Problem List).  Pt will benefit from skilled PT to increase their independence and safety with mobility to allow discharge to the venue listed below.       Follow Up Recommendations SNF    Equipment Recommendations  None recommended by PT    Recommendations for Other Services       Precautions / Restrictions Precautions Precautions: Fall      Mobility  Bed Mobility Overal bed mobility: Needs Assistance Bed Mobility: Supine to Sit     Supine to sit: Min assist     General bed mobility comments: a little assist with trunk  Transfers Overall transfer level: Needs assistance Equipment used: Rolling walker (2 wheeled) Transfers: Sit to/from Stand Sit to Stand: Mod assist         General transfer comment: Mod A to power up into standing. Once in standing, pt demonstrating good balance with use of RW for UE support.  Ambulation/Gait Ambulation/Gait assistance: Min assist Gait Distance (Feet): 20 Feet(x 2) Assistive device: Rolling walker (2 wheeled) Gait Pattern/deviations: Step-through pattern     General Gait Details: slow speed, assist with RW around objects  Stairs            Wheelchair Mobility    Modified Rankin (Stroke Patients Only)       Balance Overall balance  assessment: Needs assistance Sitting-balance support: No upper extremity supported;Feet supported Sitting balance-Leahy Scale: Fair     Standing balance support: No upper extremity supported;During functional activity Standing balance-Leahy Scale: Fair                               Pertinent Vitals/Pain      Home Living Family/patient expects to be discharged to:: Private residence Living Arrangements: Alone Available Help at Discharge: Family;Friend(s) Type of Home: Apartment Home Access: Level entry     Home Layout: One level Home Equipment: [place] - single point Additional Comments: dtr reports quaraintining    Prior Function Level of Independence: Independent with assistive device(s)         Comments: Used a cane for functional mobility. Performs ADLs and light IADLs. Friend (who acts as caregiver) assists with cooking and cleaning. Does not drive.     Hand Dominance        Extremity/Trunk Assessment   Upper Extremity Assessment Upper Extremity Assessment: Defer to OT evaluation    Lower Extremity Assessment Lower Extremity Assessment: Generalized weakness       Communication      Cognition Arousal/Alertness: Awake/alert Behavior During Therapy: WFL for tasks assessed/performed Overall Cognitive Status: Within Functional Limits for tasks assessed  General Comments: Requiring increased time but feel this is baseline      General Comments      Exercises     Assessment/Plan    PT Assessment Patient needs continued PT services  PT Problem List Decreased strength;Decreased mobility;Decreased safety awareness;Decreased activity tolerance;Cardiopulmonary status limiting activity;Decreased balance;Decreased knowledge of use of DME       PT Treatment Interventions DME instruction;Therapeutic activities;Gait training;Functional mobility training;Balance training;Therapeutic exercise;Patient/family  education    PT Goals (Current goals can be found in the Care Plan section)  Acute Rehab PT Goals Patient Stated Goal: "Get better" PT Goal Formulation: With patient/family Time For Goal Achievement: 08/17/18 Potential to Achieve Goals: Good    Frequency Min 3X/week   Barriers to discharge Decreased caregiver support      Co-evaluation               AM-PAC PT "6 Clicks" Mobility  Outcome Measure Help needed turning from your back to your side while in a flat bed without using bedrails?: A Little Help needed moving from lying on your back to sitting on the side of a flat bed without using bedrails?: A Little Help needed moving to and from a bed to a chair (including a wheelchair)?: A Little Help needed standing up from a chair using your arms (e.g., wheelchair or bedside chair)?: A Little Help needed to walk in hospital room?: A Little Help needed climbing 3-5 steps with a railing? : A Lot 6 Click Score: 17    End of Session Equipment Utilized During Treatment: Gait belt Activity Tolerance: Patient tolerated treatment well Patient left: in chair;with call bell/phone within reach Nurse Communication: Mobility status PT Visit Diagnosis: Difficulty in walking, not elsewhere classified (R26.2)    Time: 5053-9767 PT Time Calculation (min) (ACUTE ONLY): 34 min   Charges:   PT Evaluation $PT Eval Low Complexity: 1 Low  1 gait        Tresa Endo PT Acute Rehabilitation Services Pager [phone] Office [phone]   Claretha Cooper 08/03/2018, 6:07 PM

## 2018-08-03 NOTE — Evaluation (Signed)
Occupational Therapy Evaluation Patient Details Name: Joanne Morris MRN: [id number] DOB: April 11, 1950 Today's Date: 08/03/2018    History of Present Illness 68 yo female presenting with one week of not feeling well, right lower abdominal pain, fatigue, loss of smell, and three days of diarrhea. Pt testing COVID positive. PMH including DM type 2, CAD, diabetic kidney disease stage lll, and HTN.   Clinical Impression   PTA, pt was living alone and was performing BADLs and light IADLs; used a cane for functional mobility. Pt's daughter and caregiver assisted with IADLs including cooking, cleaning, and driving. Per pt, daughter and caregiver have self quarantined as they have been in contact with the patient; pt report daughter tested negative. Pt currently requiring Mod A for LB ADLs and Min Guard A for functional mobility with RW. Pt presenting with decreased strength and activity tolerance. SpO2 dropping to 88% on RA and RR elevating to 30s. Pt would benefit from further acute OT to facilitate safe dc. Pending pt progress, recommend dc to SNF for further OT to optimize safety, independence with ADLs, and return to PLOF.      Follow Up Recommendations  SNF;Supervision/Assistance - 24 hour    Equipment Recommendations  Other (comment);3 in 1 bedside commode(Defer to next venue)    Recommendations for Other Services PT consult     Precautions / Restrictions Precautions Precautions: Fall      Mobility Bed Mobility               General bed mobility comments: In recliner upon arrival  Transfers Overall transfer level: Needs assistance   Transfers: Sit to/from Stand Sit to Stand: Mod assist         General transfer comment: Mod A to power up into standing. Once in standing, pt demonstrating good balance with use of RW for UE support.    Balance Overall balance assessment: Needs assistance Sitting-balance support: No upper extremity supported;Feet supported Sitting  balance-Leahy Scale: Fair     Standing balance support: No upper extremity supported;During functional activity Standing balance-Leahy Scale: Fair Standing balance comment: Pt performing grooming at sink without UE support                           ADL either performed or assessed with clinical judgement   ADL Overall ADL's : Needs assistance/impaired Eating/Feeding: Independent;Sitting   Grooming: Oral care;Min guard;Standing Grooming Details (indicate cue type and reason): Min Guard A for safety while standing at sink Upper Body Bathing: Min guard;Sitting   Lower Body Bathing: Moderate assistance;Sit to/from stand   Upper Body Dressing : Min guard;Sitting   Lower Body Dressing: Moderate assistance;Sit to/from stand   Toilet Transfer: Moderate assistance;Ambulation;RW(Simulated to recliner) Toilet Transfer Details (indicate cue type and reason): Mod A to power up into standing         Functional mobility during ADLs: Min guard;Rolling walker General ADL Comments: Pt presenting with decreased strength. SpO2 dropping to 88% on RA with activity and RR in 30s. Pt denies fatigue and no noted SOB.     Vision Baseline Vision/History: Wears glasses Patient Visual Report: No change from baseline       Perception     Praxis      Pertinent Vitals/Pain Pain Assessment: No/denies pain     Hand Dominance Left   Extremity/Trunk Assessment Upper Extremity Assessment Upper Extremity Assessment: Overall WFL for tasks assessed   Lower Extremity Assessment Lower Extremity Assessment: Defer to PT evaluation  Communication Communication Communication: No difficulties   Cognition Arousal/Alertness: Awake/alert Behavior During Therapy: WFL for tasks assessed/performed Overall Cognitive Status: Within Functional Limits for tasks assessed                                 General Comments: Requiring increased time but feel this is baseline    General Comments  SpO2 93-88% on RA. RR 30s with activity.     Exercises     Shoulder Instructions      Home Living Family/patient expects to be discharged to:: Private residence Living Arrangements: Alone Available Help at Discharge: Family;Friend(s)(Daughter and friend; friend comes 3days/wk) Type of Home: Apartment(first floor) Home Access: Level entry     Home Layout: One level     Bathroom Shower/Tub: Teacher, early years/pre: Standard     Home Equipment: Cane - single point          Prior Functioning/Environment Level of Independence: Independent with assistive device(s)        Comments: Used a cane for functional mobility. Performs ADLs and light IADLs. Friend (who acts as caregiver) assists with cooking and cleaning. Does not drive.        OT Problem List: Decreased strength;Decreased activity tolerance;Impaired balance (sitting and/or standing);Decreased knowledge of use of DME or AE;Decreased knowledge of precautions      OT Treatment/Interventions: Self-care/ADL training;Therapeutic exercise;Energy conservation;DME and/or AE instruction;Therapeutic activities;Patient/family education    OT Goals(Current goals can be found in the care plan section) Acute Rehab OT Goals Patient Stated Goal: "Get better" OT Goal Formulation: With patient Time For Goal Achievement: 08/17/18 Potential to Achieve Goals: Good  OT Frequency: Min 2X/week   Barriers to D/C:            Co-evaluation              AM-PAC OT "6 Clicks" Daily Activity     Outcome Measure Help from another person eating meals?: None Help from another person taking care of personal grooming?: A Little Help from another person toileting, which includes using toliet, bedpan, or urinal?: A Lot Help from another person bathing (including washing, rinsing, drying)?: A Lot Help from another person to put on and taking off regular upper body clothing?: A Little Help from another  person to put on and taking off regular lower body clothing?: A Lot 6 Click Score: 16   End of Session Equipment Utilized During Treatment: Gait belt;Rolling walker Nurse Communication: Mobility status  Activity Tolerance: Patient tolerated treatment well Patient left: in chair;with call bell/phone within reach;with chair alarm set  OT Visit Diagnosis: Unsteadiness on feet (R26.81);Other abnormalities of gait and mobility (R26.89);Muscle weakness (generalized) (M62.81)                Time: 8413-2440 OT Time Calculation (min): 18 min Charges:  OT General Charges $OT Visit: 1 Visit OT Evaluation $OT Eval Moderate Complexity: [place], OTR/L Acute Rehab Pager: [phone] Office: [place] 08/03/2018, 11:50 AM

## 2018-08-03 NOTE — Care Management Important Message (Signed)
Important Message  Patient Details  Name: Joanne Morris MRN: [id number] Date of Birth: 1950/05/09   Medicare Important Message Given:  Yes - Important Message mailed due to current National Emergency   Verbal consent obtained due to current National Emergency  Relationship to patient: Self Contact Name: Patient Call Date: 08/03/18  Time: 1532 Phone: [phone] Outcome: Spoke with contact Important Message mailed to: Patient address on file      [name] 08/03/2018, 3:33 PM

## 2018-08-03 NOTE — Progress Notes (Signed)
OT Cancellation Note  Patient Details Name: Joanne Morris MRN: [id number] DOB: 08-17-1950   Cancelled Treatment:    Reason Eval/Treat Not Completed: Patient at procedure or test/ unavailable(Just finished with PT. Will return as schedule allows. Thank you.)  [place], OTR/L Acute Rehab Pager: [phone] Office: [phone] 08/03/2018, 9:58 AM

## 2018-08-04 LAB — CBC WITH DIFFERENTIAL/PLATELET
Abs Immature Granulocytes: 0.11 10*3/uL — ABNORMAL HIGH (ref 0.00–0.07)
Basophils Absolute: 0 10*3/uL (ref 0.0–0.1)
Basophils Relative: 0 %
Eosinophils Absolute: 0 10*3/uL (ref 0.0–0.5)
Eosinophils Relative: 0 %
HCT: 30.1 % — ABNORMAL LOW (ref 36.0–46.0)
Hemoglobin: 9.7 g/dL — ABNORMAL LOW (ref 12.0–15.0)
Immature Granulocytes: 1 %
Lymphocytes Relative: 19 %
Lymphs Abs: 1.6 10*3/uL (ref 0.7–4.0)
MCH: 24.4 pg — ABNORMAL LOW (ref 26.0–34.0)
MCHC: 32.2 g/dL (ref 30.0–36.0)
MCV: 75.8 fL — ABNORMAL LOW (ref 80.0–100.0)
Monocytes Absolute: 0.1 10*3/uL (ref 0.1–1.0)
Monocytes Relative: 1 %
Neutro Abs: 6.7 10*3/uL (ref 1.7–7.7)
Neutrophils Relative %: 79 %
Platelets: 353 10*3/uL (ref 150–400)
RBC: 3.97 MIL/uL (ref 3.87–5.11)
RDW: 15.7 % — ABNORMAL HIGH (ref 11.5–15.5)
WBC: 8.6 10*3/uL (ref 4.0–10.5)
nRBC: 0 % (ref 0.0–0.2)

## 2018-08-04 LAB — COMPREHENSIVE METABOLIC PANEL
ALT: 27 U/L (ref 0–44)
AST: 47 U/L — ABNORMAL HIGH (ref 15–41)
Albumin: 3.4 g/dL — ABNORMAL LOW (ref 3.5–5.0)
Alkaline Phosphatase: 46 U/L (ref 38–126)
Anion gap: 12 (ref 5–15)
BUN: 22 mg/dL (ref 8–23)
CO2: 20 mmol/L — ABNORMAL LOW (ref 22–32)
Calcium: 8.9 mg/dL (ref 8.9–10.3)
Chloride: 106 mmol/L (ref 98–111)
Creatinine, Ser: 1.17 mg/dL — ABNORMAL HIGH (ref 0.44–1.00)
GFR calc Af Amer: 56 mL/min — ABNORMAL LOW (ref >60)
GFR calc non Af Amer: 48 mL/min — ABNORMAL LOW (ref >60)
Glucose, Bld: 101 mg/dL — ABNORMAL HIGH (ref 70–99)
Potassium: 3.9 mmol/L (ref 3.5–5.1)
Sodium: 138 mmol/L (ref 135–145)
Total Bilirubin: 0.4 mg/dL (ref 0.3–1.2)
Total Protein: 7.1 g/dL (ref 6.5–8.1)

## 2018-08-04 LAB — D-DIMER, QUANTITATIVE: D-Dimer, Quant: 2.02 ug/mL-FEU — ABNORMAL HIGH (ref 0.00–0.50)

## 2018-08-04 LAB — FERRITIN: Ferritin: 706 ng/mL — ABNORMAL HIGH (ref 11–307)

## 2018-08-04 LAB — GLUCOSE, CAPILLARY
Glucose-Capillary: 93 mg/dL (ref 70–99)
Glucose-Capillary: 110 mg/dL — ABNORMAL HIGH (ref 70–99)
Glucose-Capillary: 104 mg/dL — ABNORMAL HIGH (ref 70–99)
Glucose-Capillary: 84 mg/dL (ref 70–99)

## 2018-08-04 LAB — C-REACTIVE PROTEIN: CRP: 16.9 mg/dL — ABNORMAL HIGH (ref <1.0)

## 2018-08-04 NOTE — Progress Notes (Addendum)
Called to speak with pt's daughter, Chander, no answer, left message.   Chander returned phone call. Given update. States she normally talks to the MD in the afternoon. Denies questions.

## 2018-08-04 NOTE — Progress Notes (Signed)
PROGRESS NOTE    Joanne Morris  EYC:[id number] DOB: 1951-01-11 DOA: 07/31/2018 PCP: [name], NP      Brief Narrative:  Joanne Morris is a 68 y.o. F with CAD, DM, CKD baseline 2.4 who presented with 1 week malaise, cramps, diarrhea, fatigue.  In ER, hypotensive to 60s/40s.  Found to have creatinine 5.7, given fluids and transferred to [facility] for AKI on CKD.  No hypoxia.  CXR clear.    Assessment & Plan:  Acute renal failure on CKD IV Non-anion gap metabolic acidosis Hypotensive at admission.  BP improved with fluids.   Cr 5.7 at OSH on admission.  Baseline Cr unclear, was as low as 1.5 within the last year  Irbesartan restarted 6/22, Cr now normalized and stable. P/C ratio showed albuminuria.  -Trend BMP closely     Coronavirus infection without acute hypoxic respiratory failure In setting of ongoing 2020 COVID-19 pandemic.  CXR showed bilateral infiltrates at OSH report reviewed.  Fever worse to 103 yesterday, inflammatory markers trending up.  Still no hypoxia at rest, but tachypneic.    -Continue VTE PPx with lovenox   Hypotension Resolved with fluids.  No signs of infection.  Hypertension Coronary disease secondary prevention Peripheral vascular disease secondary prevention BP softer -Hold HCTZ and stop irbesartan given soft BP -Continue amlodipine, hydralazine, metoprolol  Diabetes Glucose normal -Continue SSI only as needed -Hold home metformin  Other medications -Continue pantoprazole -Continue home Xanax, Ambien     MDM and disposition: The below labs and imaging reports reviewed and summarized above.  Medication management as above.  The patient was admitted with acute renal failure as well as COVID-19.  At baseline she is independent for all cares, but at present needs substuantial assistance to ambulate and perform ADLs.  As we work towards a safe discharge plan, we will monitor her respiratory status.  If continued  mprovement, possibly d/c as early as tomorrow or Thursday    DVT prophylaxis: Lovenox Code Status: FULL Family Communication: Daughter    Consultants:   None  Procedures:   None  Antimicrobials:   None   Culture data:   None      Subjective: Fever again overnight.  No dyspnea, no cough.  Dyspneic with exertion.  No sputum, no confusion, good making good urine output.  No dysuria, hematuria, urinary frequency, urinary urgency.  Objective: Vitals:   08/04/18 0415 08/04/18 0740 08/04/18 1609 08/04/18 1610  BP:  (!) 109/51  (!) 105/50  Pulse:  92  81  Resp:  (!) 26  (!) 21  Temp: (!) 103.1 F (39.5 C) 99.2 F (37.3 C) 98.2 F (36.8 C)   TempSrc: Oral Oral Oral   SpO2:  93%  98%  Weight:      Height:        Intake/Output Summary (Last 24 hours) at 08/04/2018 1807 Last data filed at 08/04/2018 1659 Gross per 24 hour  Intake 480 ml  Output 600 ml  Net -120 ml   Filed Weights   08/01/18 0500  Weight: 59 kg    Examination: General appearance: Adult female, sitting in recliner, no acute distress HEENT: Anicteric, conjunctiva pink, lids and lashes normal. No nasal deformity, discharge, epistaxis.  Lips moist, dentition poor, OP dry, no oral lesions.   Skin: Warm and dry.   No suspicious rashes or lesions. Cardiac: Regular rate and rhythm, no murmurs, JVP normal, no lower extremity edema. Respiratory: Tachypneic at rest, worse with exertion, no rales or  wheezes.  Lung sounds diminished bilaterally. Abdomen: Soft, no suprapubic tenderness, no ascites or distention. MSK: No deformities or effusions. Neuro: Awake and alert, extraocular movements intact, moves all extremities with normal strength and coordination, speech fluent.  Globally weak.  Psychomotor slowing mild. Psych: Sensorium intact and responding to questions, attention normal, affect blunted, judgment insight appear slightly impaired.     Data Reviewed: I have personally reviewed following labs  and imaging studies:  CBC: Recent Labs  Lab 08/02/18 0250 08/03/18 0243 08/04/18 0508  WBC 5.7 8.3 8.6  NEUTROABS 4.0 6.3 6.7  HGB 9.9* 10.3* 9.7*  HCT 31.6* 32.0* 30.1*  MCV 76.0* 75.7* 75.8*  PLT 310 322 263   Basic Metabolic Panel: Recent Labs  Lab 08/01/18 0547 08/02/18 0250 08/03/18 0243 08/04/18 0508  NA 142 140 140 138  K 3.7 3.7 4.1 3.9  CL 112* 110 109 106  CO2 19* 19* 20* 20*  GLUCOSE 142* 88 66* 101*  BUN 38* 17 17 22   CREATININE 2.22* 1.07* 1.07* 1.17*  CALCIUM 8.9 8.8* 9.0 8.9   GFR: Estimated Creatinine Clearance: 38.5 mL/min (A) (by C-G formula based on SCr of 1.17 mg/dL (H)). Liver Function Tests: Recent Labs  Lab 08/01/18 0547 08/02/18 0250 08/03/18 0243 08/04/18 0508  AST 39 53* 43* 47*  ALT 20 27 25 27   ALKPHOS 48 43 42 46  BILITOT 0.4 0.5 0.1* 0.4  PROT 7.6 7.1 7.0 7.1  ALBUMIN 4.0 3.8 3.5 3.4*   No results for input(s): LIPASE, AMYLASE in the last 168 hours. No results for input(s): AMMONIA in the last 168 hours. Coagulation Profile: No results for input(s): INR, PROTIME in the last 168 hours. Cardiac Enzymes: No results for input(s): CKTOTAL, CKMB, CKMBINDEX, TROPONINI in the last 168 hours. BNP (last 3 results) No results for input(s): PROBNP in the last 8760 hours. HbA1C: No results for input(s): HGBA1C in the last 72 hours. CBG: Recent Labs  Lab 08/03/18 1648 08/03/18 2137 08/04/18 0738 08/04/18 1206 08/04/18 1608  GLUCAP 121* 115* 93 110* 104*   Lipid Profile: No results for input(s): CHOL, HDL, LDLCALC, TRIG, CHOLHDL, LDLDIRECT in the last 72 hours. Thyroid Function Tests: No results for input(s): TSH, T4TOTAL, FREET4, T3FREE, THYROIDAB in the last 72 hours. Anemia Panel: Recent Labs    08/03/18 0243 08/04/18 0508  FERRITIN 873* 706*   Urine analysis:    Component Value Date/Time   COLORURINE YELLOW 08/01/2018 [place] 08/01/2018 2243   LABSPEC 1.011 08/01/2018 2243   PHURINE 5.0 08/01/2018  2243   GLUCOSEU NEGATIVE 08/01/2018 2243   HGBUR SMALL (A) 08/01/2018 2243   BILIRUBINUR NEGATIVE 08/01/2018 2243   [place] 08/01/2018 2243   PROTEINUR 100 (A) 08/01/2018 2243   UROBILINOGEN 0.2 04/06/2014 0834   NITRITE NEGATIVE 08/01/2018 2243   LEUKOCYTESUR LARGE (A) 08/01/2018 2243   Sepsis Labs: @LABRCNTIP (procalcitonin:4,lacticacidven:4)  )No results found for this or any previous visit (from the past 240 hour(s)).       Radiology Studies: No results found.      Scheduled Meds: . amLODipine  10 mg Oral Daily  . aspirin EC  81 mg Oral Daily  . enoxaparin (LOVENOX) injection  40 mg Subcutaneous Q24H  . hydrALAZINE  50 mg Oral Q8H  . insulin aspart  0-5 Units Subcutaneous QHS  . insulin aspart  0-9 Units Subcutaneous TID WC  . metoprolol tartrate  100 mg Oral BID  . pantoprazole  40 mg Oral Daily  . pravastatin  40 mg Oral q1800  . zolpidem  5 mg Oral QHS   Continuous Infusions:    LOS: 4 days    Time spent: 25 minutes    Edwin Dada, MD [facility] 08/04/2018, 6:07 PM     Please page through [place]:  [email or web address] Password [email or web address] If 7PM-7AM, please contact night-coverage

## 2018-08-05 DIAGNOSIS — U071 COVID-19: Secondary | ICD-10-CM

## 2018-08-05 LAB — CBC WITH DIFFERENTIAL/PLATELET
Abs Immature Granulocytes: 0.14 10*3/uL — ABNORMAL HIGH (ref 0.00–0.07)
Basophils Absolute: 0 10*3/uL (ref 0.0–0.1)
Basophils Relative: 0 %
Eosinophils Absolute: 0.1 10*3/uL (ref 0.0–0.5)
Eosinophils Relative: 1 %
HCT: 29.3 % — ABNORMAL LOW (ref 36.0–46.0)
Hemoglobin: 9 g/dL — ABNORMAL LOW (ref 12.0–15.0)
Immature Granulocytes: 2 %
Lymphocytes Relative: 22 %
Lymphs Abs: 1.4 10*3/uL (ref 0.7–4.0)
MCH: 23.6 pg — ABNORMAL LOW (ref 26.0–34.0)
MCHC: 30.7 g/dL (ref 30.0–36.0)
MCV: 76.7 fL — ABNORMAL LOW (ref 80.0–100.0)
Monocytes Absolute: 0.2 10*3/uL (ref 0.1–1.0)
Monocytes Relative: 3 %
Neutro Abs: 4.6 10*3/uL (ref 1.7–7.7)
Neutrophils Relative %: 72 %
Platelets: 402 10*3/uL — ABNORMAL HIGH (ref 150–400)
RBC: 3.82 MIL/uL — ABNORMAL LOW (ref 3.87–5.11)
RDW: 15.9 % — ABNORMAL HIGH (ref 11.5–15.5)
WBC: 6.3 10*3/uL (ref 4.0–10.5)
nRBC: 0 % (ref 0.0–0.2)

## 2018-08-05 LAB — COMPREHENSIVE METABOLIC PANEL
ALT: 34 U/L (ref 0–44)
AST: 59 U/L — ABNORMAL HIGH (ref 15–41)
Albumin: 3.1 g/dL — ABNORMAL LOW (ref 3.5–5.0)
Alkaline Phosphatase: 44 U/L (ref 38–126)
Anion gap: 8 (ref 5–15)
BUN: 25 mg/dL — ABNORMAL HIGH (ref 8–23)
CO2: 23 mmol/L (ref 22–32)
Calcium: 8.8 mg/dL — ABNORMAL LOW (ref 8.9–10.3)
Chloride: 107 mmol/L (ref 98–111)
Creatinine, Ser: 1.11 mg/dL — ABNORMAL HIGH (ref 0.44–1.00)
GFR calc Af Amer: 60 mL/min — ABNORMAL LOW (ref >60)
GFR calc non Af Amer: 51 mL/min — ABNORMAL LOW (ref >60)
Glucose, Bld: 86 mg/dL (ref 70–99)
Potassium: 3.8 mmol/L (ref 3.5–5.1)
Sodium: 138 mmol/L (ref 135–145)
Total Bilirubin: 0.2 mg/dL — ABNORMAL LOW (ref 0.3–1.2)
Total Protein: 6.7 g/dL (ref 6.5–8.1)

## 2018-08-05 LAB — FERRITIN: Ferritin: 648 ng/mL — ABNORMAL HIGH (ref 11–307)

## 2018-08-05 LAB — GLUCOSE, CAPILLARY
Glucose-Capillary: 124 mg/dL — ABNORMAL HIGH (ref 70–99)
Glucose-Capillary: 99 mg/dL (ref 70–99)
Glucose-Capillary: 101 mg/dL — ABNORMAL HIGH (ref 70–99)
Glucose-Capillary: 117 mg/dL — ABNORMAL HIGH (ref 70–99)

## 2018-08-05 LAB — D-DIMER, QUANTITATIVE: D-Dimer, Quant: 1.69 ug/mL-FEU — ABNORMAL HIGH (ref 0.00–0.50)

## 2018-08-05 LAB — C-REACTIVE PROTEIN: CRP: 16.1 mg/dL — ABNORMAL HIGH (ref <1.0)

## 2018-08-05 MED ORDER — AMLODIPINE BESYLATE 5 MG PO TABS
5.0000 mg | ORAL_TABLET | Freq: Every day | ORAL | Status: DC
  Administered 2018-08-06 – 2018-08-07 (×2): 5 mg via ORAL
  Filled 2018-08-05 (×2): qty 1

## 2018-08-05 NOTE — Progress Notes (Signed)
CSW attempted to call Chander for SNF discussion, as patient reports she would like daughter to make decision of SNF choices.   Chander did not answer, CSW lvm.   [place], [place]

## 2018-08-05 NOTE — Progress Notes (Signed)
Physical Therapy Treatment Patient Details Name: Joanne Morris MRN: [id number] DOB: 1950/05/04 Today's Date: 08/05/2018    History of Present Illness 68 yo female presenting with one week of not feeling well, right lower abdominal pain, fatigue, loss of smell, and three days of diarrhea. Pt testing COVID positive. PMH including DM type 2, CAD, diabetic kidney disease stage lll, and HTN.    PT Comments    Patient tolerated activity with SaO2 >90% on room air. Ambulated around room twice with seated rest between. Required cues for safe use of RW (she uses cane at home) and required min assist with balancex3 when simulating cane (hand-held assist). Discussed with OT ?need to update discharge plan, however they are concerned she will not have stamina to do her basic ADLs. Also concerned that family may be quarantining and not available to assist pt with grocery shopping.    Follow Up Recommendations  SNF(pt may progress to d/c home with family support)     Equipment Recommendations  None recommended by PT    Recommendations for Other Services       Precautions / Restrictions Precautions Precautions: Fall Restrictions Weight Bearing Restrictions: No    Mobility  Bed Mobility Overal bed mobility: Modified Independent Bed Mobility: Supine to Sit     Supine to sit: Modified independent (Device/Increase time)     General bed mobility comments: with rail and incr time  Transfers Overall transfer level: Needs assistance Equipment used: Rolling walker (2 wheeled);1 person hand held assist Transfers: Sit to/from Stand Sit to Stand: Min guard         General transfer comment: vc for safe use of RW (pt uses cane at home); no imbalance but close guarding for safety  Ambulation/Gait Ambulation/Gait assistance: Min assist Gait Distance (Feet): 30 Feet(with RW; 30 +1 HHA) Assistive device: Rolling walker (2 wheeled);1 person hand held assist Gait Pattern/deviations: Step-through  pattern;Trunk flexed     General Gait Details: slow speed, assist with turning RW; simulated use of cane with HHA and pt required min assist x 3 slight imbalance   Stairs             Wheelchair Mobility    Modified Rankin (Stroke Patients Only)       Balance Overall balance assessment: Needs assistance Sitting-balance support: No upper extremity supported;Feet supported Sitting balance-Leahy Scale: Fair     Standing balance support: No upper extremity supported;During functional activity Standing balance-Leahy Scale: Fair Standing balance comment: Patient stood from chair without assist and maintained balance x 30 seconds prior to walking                            Cognition Arousal/Alertness: Awake/alert Behavior During Therapy: WFL for tasks assessed/performed Overall Cognitive Status: Within Functional Limits for tasks assessed                                        Exercises      General Comments General comments (skin integrity, edema, etc.): Discussed d/c plan and pt wants to go home and not to SNF. Unclear if her family can provide the support/assistance they were providing or if they are quarantining away from pt      Pertinent Vitals/Pain Pain Assessment: No/denies pain    Home Living Family/patient expects to be discharged to:: Private residence Living Arrangements: Alone Available Help at Discharge:  Family;Friend(s) Type of Home: Apartment Home Access: Level entry   Home Layout: One level Home Equipment: Cane - single point Additional Comments: dtr reports quaraintining    Prior Function Level of Independence: Independent with assistive device(s)      Comments: Used a cane for functional mobility. Performs ADLs and light IADLs. Friend (who acts as caregiver) assists with cooking and cleaning. Does not drive.   PT Goals (current goals can now be found in the care plan section) Acute Rehab PT Goals Patient Stated  Goal: "Get better" Time For Goal Achievement: 08/17/18 Progress towards PT goals: Progressing toward goals    Frequency    Min 3X/week      PT Plan Current plan remains appropriate    Co-evaluation              AM-PAC PT "6 Clicks" Mobility   Outcome Measure  Help needed turning from your back to your side while in a flat bed without using bedrails?: A Little Help needed moving from lying on your back to sitting on the side of a flat bed without using bedrails?: A Little Help needed moving to and from a bed to a chair (including a wheelchair)?: A Little Help needed standing up from a chair using your arms (e.g., wheelchair or bedside chair)?: A Little Help needed to walk in hospital room?: A Little Help needed climbing 3-5 steps with a railing? : A Lot 6 Click Score: 17    End of Session Equipment Utilized During Treatment: Gait belt Activity Tolerance: Patient tolerated treatment well Patient left: in chair;with call bell/phone within reach;with nursing/sitter in room(RN prepping for pt's bath) Nurse Communication: Mobility status PT Visit Diagnosis: Difficulty in walking, not elsewhere classified (R26.2)     Time: 5456-2563 PT Time Calculation (min) (ACUTE ONLY): 26 min  Charges:  $Gait Training: 23-37 mins                        [employer], PT 08/05/2018, 2:05 PM

## 2018-08-05 NOTE — NC FL2 (Signed)
[place] LEVEL OF CARE SCREENING TOOL     IDENTIFICATION  Patient Name: Joanne Morris Birthdate: Feb 18, 1950 Sex: female Admission Date (Current Location): 07/31/2018  [facility] and [place] Number:  [employer] and Address:  [facility] [place], [place] [address], [place], [place] [address]      Provider Number: [id number]  Attending Physician Name and Address:  Patrecia Pour, MD  Relative Name and Phone Number:  Chander ([id number])[phone]    Current Level of Care: Hospital Recommended Level of Care: [place] Prior Approval Number:    Date Approved/Denied:   PASRR Number: [id number]  Discharge Plan: SNF    Current Diagnoses: Patient Active Problem List   Diagnosis Date Noted  . Acute kidney injury superimposed on CKD ([place]) 08/01/2018  . COVID-19 virus infection 08/01/2018  . Carotid stenosis 11/24/2014  . Neck pain on right side 10/04/2014  . Hematoma of neck 04/23/2014  . Carotid stenosis, asymptomatic 04/18/2014  . Carotid artery stenosis, asymptomatic 02/07/2014  . PAD (peripheral artery disease) ([place]) 07/20/2013  . Peripheral arterial disease ([place]) 06/08/2013  . Abdominal pain 10/11/2012  . Dyslipidemia 10/11/2012  . Diabetes mellitus ([place]) 10/11/2012  . Depression 10/11/2012  . Radiculopathy of leg 10/11/2012  . Atypical chest pain 05/31/2011  . CAD (coronary artery disease) 05/31/2011  . Hypokalemia 05/31/2011  . HTN (hypertension) 05/31/2011  . Anemia 05/31/2011  . Leucocytosis 05/31/2011  . DM2 (diabetes mellitus, type 2) ([place]) 05/31/2011  . Hyperlipemia 05/31/2011    Orientation RESPIRATION BLADDER Height & Weight     Self, Time, Situation, Place  Normal Incontinent, External catheter Weight: 130 lb (59 kg) Height:  5\' 1"  (154.9 cm)  BEHAVIORAL SYMPTOMS/MOOD NEUROLOGICAL BOWEL NUTRITION STATUS      Continent Diet(see discharge summary)  AMBULATORY STATUS COMMUNICATION OF NEEDS Skin   Limited  Assist Verbally Normal                       Personal Care Assistance Level of Assistance  Bathing, Feeding, Dressing, Total care Bathing Assistance: Limited assistance Feeding assistance: Independent Dressing Assistance: Limited assistance Total Care Assistance: Limited assistance   Functional Limitations Info  Sight, Hearing, Speech Sight Info: Adequate Hearing Info: Adequate Speech Info: Adequate    SPECIAL CARE FACTORS FREQUENCY  PT (By licensed PT), OT (By licensed OT)     PT Frequency: min 5x weekly OT Frequency: min 5x weekly            Contractures Contractures Info: Not present    Additional Factors Info  Code Status, Allergies, Isolation Precautions Code Status Info: full Allergies Info: No known allergies     Isolation Precautions Info: air and contact precautions, emerging pathogen, MRSA     Current Medications (08/05/2018):  This is the current hospital active medication list Current Facility-Administered Medications  Medication Dose Route Frequency Provider Last Rate Last Dose  . acetaminophen (TYLENOL) tablet 650 mg  650 mg Oral Q6H PRN Barb Merino, MD   650 mg at 08/04/18 0505  . ALPRAZolam [name]) tablet 0.25 mg  0.25 mg Oral Daily PRN Barb Merino, MD      . Derrill Memo ON 08/06/2018] amLODipine (NORVASC) tablet 5 mg  5 mg Oral Daily Patrecia Pour, MD      . aspirin EC tablet 81 mg  81 mg Oral Daily Barb Merino, MD   81 mg at 08/05/18 0949  . enoxaparin (LOVENOX) injection 40 mg  40 mg Subcutaneous Q24H  Edwin Dada, MD   40 mg at 08/05/18 0948  . HYDROcodone-acetaminophen (NORCO/VICODIN) 5-325 MG per tablet 1-2 tablet  1-2 tablet Oral Q6H PRN Barb Merino, MD   2 tablet at 08/05/18 0317  . insulin aspart (novoLOG) injection 0-5 Units  0-5 Units Subcutaneous QHS Barb Merino, MD      . insulin aspart (novoLOG) injection 0-9 Units  0-9 Units Subcutaneous TID WC Barb Merino, MD   1 Units at 08/05/18 0857  . metoprolol tartrate  (LOPRESSOR) tablet 100 mg  100 mg Oral BID Edwin Dada, MD   100 mg at 08/05/18 0949  . ondansetron (ZOFRAN) tablet 4 mg  4 mg Oral Q6H PRN Barb Merino, MD       Or  . ondansetron (ZOFRAN) injection 4 mg  4 mg Intravenous Q6H PRN Barb Merino, MD      . pantoprazole (PROTONIX) EC tablet 40 mg  40 mg Oral Daily Barb Merino, MD   40 mg at 08/05/18 0949  . pravastatin (PRAVACHOL) tablet 40 mg  40 mg Oral q1800 Edwin Dada, MD   40 mg at 08/04/18 1706  . tiZANidine (ZANAFLEX) tablet 4 mg  4 mg Oral QHS PRN Danford, Suann Larry, MD      . zolpidem (AMBIEN) tablet 5 mg  5 mg Oral QHS Barb Merino, MD   5 mg at 08/04/18 2300     Discharge Medications: Please see discharge summary for a list of discharge medications.  Relevant Imaging Results:  Relevant Lab Results:   Additional Information SSN: [id number]  Alberteen Sam, LCSW

## 2018-08-05 NOTE — TOC Initial Note (Signed)
Transition of Care [facility]) - Initial/Assessment Note    Patient Details  Name: Joanne Morris MRN: [id number] Date of Birth: May 23, 1950  Transition of Care [facility]) CM/SW Contact:    Alberteen Sam,  Phone Number: [phone] 08/05/2018, 4:47 PM  Clinical Narrative:                  CSW consulted with patient regarding SNF recommendation. Patient requested CSW call her daughter Andria Frames to discuss. CSW called Andria Frames who reports she is in agreement with SNF as discharge plan as patient lives alone and she is worried about her. She reports patient has a friend who can assist her, however he is just recovering from [place]. Andria Frames gave CSW permission to fax referral to [facility] for patient and update her on any bed offers, as [facility] in [place] has no beds avail right now.   CSW will continue to update Andria Frames on any bed offers.   Expected Discharge Plan: Skilled Nursing Facility Barriers to Discharge: Continued Medical Work up   Patient Goals and CMS Choice Patient states their goals for this hospitalization and ongoing recovery are:: to go to rehab before going home CMS Medicare.gov Compare Post Acute Care list provided to:: Patient Choice offered to / list presented to : Patient  Expected Discharge Plan and Services Expected Discharge Plan: [place] Choice: [place] arrangements for the past 2 months: Single Family Home                                      Prior Living Arrangements/Services Living arrangements for the past 2 months: Single Family Home Lives with:: Self Patient language and need for interpreter reviewed:: Yes Do you feel safe going back to the place where you live?: No   lives alone, needs short term rehab before returning  Need for Family Participation in Patient Care: Yes (Comment) Care giver support system in place?: Yes (comment)   Criminal Activity/Legal Involvement Pertinent to Current  Situation/Hospitalization: No - Comment as needed  Activities of Daily Living Home Assistive Devices/Equipment: None ADL Screening (condition at time of admission) Patient's cognitive ability adequate to safely complete daily activities?: Yes Is the patient deaf or have difficulty hearing?: No Does the patient have difficulty seeing, even when wearing glasses/contacts?: No Does the patient have difficulty concentrating, remembering, or making decisions?: No Patient able to express need for assistance with ADLs?: Yes Does the patient have difficulty dressing or bathing?: No Independently performs ADLs?: Yes (appropriate for developmental age) Does the patient have difficulty walking or climbing stairs?: No Weakness of Legs: None Weakness of Arms/Hands: None  Permission Sought/Granted Permission sought to share information with : Case Manager, Family Supports, Chartered certified accountant granted to share information with : Yes, Verbal Permission Granted  Share Information with NAME: Andria Frames  Permission granted to share info w AGENCY: SNFs  Permission granted to share info w Relationship: daughter  Permission granted to share info w Contact Information: [phone]  Emotional Assessment Appearance:: Other (Comment Required(unable to assess - remote) Attitude/Demeanor/Rapport: Unable to Assess Affect (typically observed): Unable to Assess Orientation: : Oriented to Self, Oriented to Place, Oriented to  Time, Oriented to Situation Alcohol / Substance Use: Not Applicable Psych Involvement: No (comment)  Admission diagnosis:  COVID HYPOTENSION PNEUMONIA RENAL FAILURE  Patient Active Problem List   Diagnosis Date Noted  .  Acute kidney injury superimposed on CKD ([place]) 08/01/2018  . COVID-19 virus infection 08/01/2018  . Carotid stenosis 11/24/2014  . Neck pain on right side 10/04/2014  . Hematoma of neck 04/23/2014  . Carotid stenosis, asymptomatic 04/18/2014  .  Carotid artery stenosis, asymptomatic 02/07/2014  . PAD (peripheral artery disease) ([place]) 07/20/2013  . Peripheral arterial disease ([place]) 06/08/2013  . Abdominal pain 10/11/2012  . Dyslipidemia 10/11/2012  . Diabetes mellitus ([place]) 10/11/2012  . Depression 10/11/2012  . Radiculopathy of leg 10/11/2012  . Atypical chest pain 05/31/2011  . CAD (coronary artery disease) 05/31/2011  . Hypokalemia 05/31/2011  . HTN (hypertension) 05/31/2011  . Anemia 05/31/2011  . Leucocytosis 05/31/2011  . DM2 (diabetes mellitus, type 2) ([place]) 05/31/2011  . Hyperlipemia 05/31/2011   PCP:  [name], NP Pharmacy:   [place], [place]. [place]. [place] [address] Phone: [phone] Fax: [phone]  [facility] [address] [place] - [address] [place] [address] Phone: [phone] Fax: [phone]  [id number] [place], [place] [address] [place] [address] Phone: [phone] Fax: [phone]  [id number] [address], [place] [address] Phone: [phone] Fax: [phone]     Social Determinants of Health (SDOH) Interventions    Readmission Risk Interventions No flowsheet data found.

## 2018-08-05 NOTE — Progress Notes (Signed)
PROGRESS NOTE  Joanne Morris  YSA:[id number] DOB: 02-24-1950 DOA: 07/31/2018 PCP: [name], NP   Brief Narrative: Joanne Morris is a 68 y.o. female with a history of CAD, T2DM, stage III CKD and resistant HTN who presented to the ED 6/19 with 1 week of fatigue, malaise, and loose stools. She was found to be covid positive with hypotension and renal failure. CXR showed modest bilateral infiltrates but she's had no hypoxia at rest, but has had some hypoxia with exertion and tachypnea. Creatinine has improved with IV fluids. PT and OT recommend disposition to SNF due to acute deconditioning. This is pending bed placement.   Assessment & Plan: Principal Problem:   Acute kidney injury superimposed on CKD ([place]) Active Problems:   CAD (coronary artery disease)   HTN (hypertension)   DM2 (diabetes mellitus, type 2) ([place])   Peripheral arterial disease ([place])   COVID-19 virus infection  Covid-19 infection without pneumonia:  - Continue airborne, contact precautions. PPE including surgical gown, gloves, face shield, cap, shoe covers, and N-95 used during this encounter in a negative pressure room.  - Check daily labs: CBC w/diff, CMP, CRP. CRP remains grossly elevated but declining. - No remdesivir, steroids, or actemra given with no hypoxia. Should respiratory distress develop, would repeat CXR and consider starting medications.  - Enoxaparin prophylactic dose.  - Avoid NSAIDs  ARF on stage III CKD with NAGMA, AOCD: Cr 5.7 at OSH on admission with baseline Cr ~1.1 making CrCl ~84ml/min. Hemodynamically mediated in addition to ARB since improved.  - Continue holding nephrotoxins and monitoring creatinine/UOP.   Resistant HTN: Now that hypotension is resolved, BP meds have been restarted.  - Continue norvasc, hydralazine metoprolol. Holding HCTZ, ARB.  T2DM:  - Continue SSI. Had hypoglycemia previously during hospitalization.  - Hold metformin.   CAD:  - Continue BB, ASA, statin  GERD:   - Continue PPI  Anxiety:  - Continue xanax  Insomnia: Continue ambien  DVT prophylaxis: Lovenox Code Status: Full Family Communication: Daughter by phone today. Disposition Plan: SNF once bed available.   Consultants:   None  Procedures:   None  Antimicrobials:  None   Subjective: No fevers in past 24 hours. Denies dyspnea, feels well, eating some. Feels weak and RN reports unsteady gait when up to bathroom this AM but no other complaints.  Objective: Vitals:   08/04/18 1610 08/04/18 1947 08/05/18 0855 08/05/18 0948  BP: (!) 105/50 (!) 128/59 (!) 110/52 (!) 127/50  Pulse: 81 94 77 87  Resp: (!) 21 (!) 21 (!) 22   Temp:  99.4 F (37.4 C) 98 F (36.7 C)   TempSrc:  Oral Oral   SpO2: 98% 98% 97%   Weight:      Height:        Intake/Output Summary (Last 24 hours) at 08/05/2018 1149 Last data filed at 08/05/2018 1000 Gross per 24 hour  Intake 720 ml  Output 300 ml  Net 420 ml   Filed Weights   08/01/18 0500  Weight: 59 kg    Gen: 68 y.o. female in no distress. Chronically ill-appearing Pulm: Non-labored breathing room air. Clear to auscultation bilaterally.  CV: Regular rate and rhythm. No murmur, rub, or gallop. No JVD, no pedal edema. GI: Abdomen soft, non-tender, non-distended, with normoactive bowel sounds. No organomegaly or masses felt. Ext: Warm, no deformities Skin: No rashes, lesions or ulcers Neuro: Alert and oriented. No focal neurological deficits. Psych: Judgement and insight appear normal. Mood & affect appropriate.  Data Reviewed: I have personally reviewed following labs and imaging studies  CBC: Recent Labs  Lab 08/02/18 0250 08/03/18 0243 08/04/18 0508 08/05/18 0410  WBC 5.7 8.3 8.6 6.3  NEUTROABS 4.0 6.3 6.7 4.6  HGB 9.9* 10.3* 9.7* 9.0*  HCT 31.6* 32.0* 30.1* 29.3*  MCV 76.0* 75.7* 75.8* 76.7*  PLT 310 322 353 371*   Basic Metabolic Panel: Recent Labs  Lab 08/01/18 0547 08/02/18 0250 08/03/18 0243 08/04/18 0508  08/05/18 0410  NA 142 140 140 138 138  K 3.7 3.7 4.1 3.9 3.8  CL 112* 110 109 106 107  CO2 19* 19* 20* 20* 23  GLUCOSE 142* 88 66* 101* 86  BUN 38* 17 17 22  25*  CREATININE 2.22* 1.07* 1.07* 1.17* 1.11*  CALCIUM 8.9 8.8* 9.0 8.9 8.8*   GFR: Estimated Creatinine Clearance: 40.6 mL/min (A) (by C-G formula based on SCr of 1.11 mg/dL (H)). Liver Function Tests: Recent Labs  Lab 08/01/18 0547 08/02/18 0250 08/03/18 0243 08/04/18 0508 08/05/18 0410  AST 39 53* 43* 47* 59*  ALT 20 27 25 27  34  ALKPHOS 48 43 42 46 44  BILITOT 0.4 0.5 0.1* 0.4 0.2*  PROT 7.6 7.1 7.0 7.1 6.7  ALBUMIN 4.0 3.8 3.5 3.4* 3.1*   No results for input(s): LIPASE, AMYLASE in the last 168 hours. No results for input(s): AMMONIA in the last 168 hours. Coagulation Profile: No results for input(s): INR, PROTIME in the last 168 hours. Cardiac Enzymes: No results for input(s): CKTOTAL, CKMB, CKMBINDEX, TROPONINI in the last 168 hours. BNP (last 3 results) No results for input(s): PROBNP in the last 8760 hours. HbA1C: No results for input(s): HGBA1C in the last 72 hours. CBG: Recent Labs  Lab 08/04/18 0738 08/04/18 1206 08/04/18 1608 08/04/18 2207 08/05/18 0816  GLUCAP 93 110* 104* 84 124*   Lipid Profile: No results for input(s): CHOL, HDL, LDLCALC, TRIG, CHOLHDL, LDLDIRECT in the last 72 hours. Thyroid Function Tests: No results for input(s): TSH, T4TOTAL, FREET4, T3FREE, THYROIDAB in the last 72 hours. Anemia Panel: Recent Labs    08/04/18 0508 08/05/18 0410  FERRITIN 706* 648*   Urine analysis:    Component Value Date/Time   COLORURINE YELLOW 08/01/2018 [place] 08/01/2018 2243   LABSPEC 1.011 08/01/2018 2243   PHURINE 5.0 08/01/2018 2243   GLUCOSEU NEGATIVE 08/01/2018 2243   HGBUR SMALL (A) 08/01/2018 2243   BILIRUBINUR NEGATIVE 08/01/2018 2243   [place] 08/01/2018 2243   PROTEINUR 100 (A) 08/01/2018 2243   UROBILINOGEN 0.2 04/06/2014 0834   NITRITE  NEGATIVE 08/01/2018 2243   LEUKOCYTESUR LARGE (A) 08/01/2018 2243   No results found for this or any previous visit (from the past 240 hour(s)).    Radiology Studies: No results found.  Scheduled Meds: . amLODipine  10 mg Oral Daily  . aspirin EC  81 mg Oral Daily  . enoxaparin (LOVENOX) injection  40 mg Subcutaneous Q24H  . hydrALAZINE  50 mg Oral Q8H  . insulin aspart  0-5 Units Subcutaneous QHS  . insulin aspart  0-9 Units Subcutaneous TID WC  . metoprolol tartrate  100 mg Oral BID  . pantoprazole  40 mg Oral Daily  . pravastatin  40 mg Oral q1800  . zolpidem  5 mg Oral QHS   Continuous Infusions:   LOS: 5 days   Time spent: 25 minutes.  Patrecia Pour, MD [facility] [email or web address] Password [facility] 08/05/2018, 11:49 AM

## 2018-08-05 NOTE — Progress Notes (Addendum)
Occupational Therapy Treatment Patient Details Name: Joanne Morris MRN: [id number] DOB: 08/27/1950 Today's Date: 08/05/2018    History of present illness 68 yo female presenting with one week of not feeling well, right lower abdominal pain, fatigue, loss of smell, and three days of diarrhea. Pt testing COVID positive. PMH including DM type 2, CAD, diabetic kidney disease stage lll, and HTN.   OT comments  Pt progressing towards established OT goals. Pt performing functional mobility in hallway to simulated home distance with Min A for single HHA. Pt SpO2 >90% on RA throughout. Providing education on safe tub transfer and discussed difference for use of shower seat vs 3N1. Pt verbalized understanding and would benefit form further practice. If pt to have family support, update dc recommendation to home with [place] and support for IADLs as pt continues to present with fatigue. Will continue to follow acutely as admitted.    Follow Up Recommendations  Home health OT;Supervision/Assistance - 24 hour;SNF(Need to discuss with family about support)    Equipment Recommendations  3 in 1 bedside commode    Recommendations for Other Services PT consult    Precautions / Restrictions Precautions Precautions: Fall Restrictions Weight Bearing Restrictions: No       Mobility Bed Mobility Overal bed mobility: Modified Independent Bed Mobility: Supine to Sit     Supine to sit: Modified independent (Device/Increase time)     General bed mobility comments: with rail and incr time  Transfers Overall transfer level: Needs assistance Equipment used: Rolling walker (2 wheeled);1 person hand held assist Transfers: Sit to/from Stand Sit to Stand: Min guard         General transfer comment: vc for safe use of RW (pt uses cane at home); no imbalance but close guarding for safety    Balance Overall balance assessment: Needs assistance Sitting-balance support: No upper extremity supported;Feet  supported Sitting balance-Leahy Scale: Fair     Standing balance support: No upper extremity supported;During functional activity Standing balance-Leahy Scale: Fair Standing balance comment: Patient stood from chair without assist and maintained balance x 30 seconds prior to walking                           ADL either performed or assessed with clinical judgement   ADL Overall ADL's : Needs assistance/impaired                         Toilet Transfer: Min guard;Ambulation(single HHA) Toilet Transfer Details (indicate cue type and reason): Min Guard A for safety. Simulated in room       Tub/Shower Transfer Details (indicate cue type and reason): Educating pt on use of shower seat vs 3N1. Pt verbalizing understanding.  Functional mobility during ADLs: Minimal assistance(single HHA) General ADL Comments: Pt presenting with decreased strength. SpO2 dropping to 88% on RA with activity and RR in 30s. Pt denies fatigue and no noted SOB.     Vision       Perception     Praxis      Cognition Arousal/Alertness: Awake/alert Behavior During Therapy: WFL for tasks assessed/performed Overall Cognitive Status: Within Functional Limits for tasks assessed                                 General Comments: Pt discussing discharge options, home support, and DME needs.         Exercises  Shoulder Instructions       General Comments Discussed d/c plan and pt wants to go home and not to SNF. Unclear if her family can provide the support/assistance they were providing or if they are quarantining away from pt    Pertinent Vitals/ Pain       Pain Assessment: No/denies pain  Home Living                                          Prior Functioning/Environment              Frequency  Min 2X/week        Progress Toward Goals  OT Goals(current goals can now be found in the care plan section)  Progress towards OT goals:  Progressing toward goals  Acute Rehab OT Goals Patient Stated Goal: "Get better" OT Goal Formulation: With patient Time For Goal Achievement: 08/17/18 Potential to Achieve Goals: Good ADL Goals Pt Will Perform Grooming: with modified independence;standing Pt Will Perform Lower Body Dressing: with modified independence;sit to/from stand Pt Will Transfer to Toilet: with modified independence;ambulating;regular height toilet;bedside commode Pt Will Perform Toileting - Clothing Manipulation and hygiene: with modified independence;sit to/from stand;sitting/lateral leans Pt Will Perform Tub/Shower Transfer: Tub transfer;ambulating;3 in 1;with min guard assist;rolling walker  Plan Discharge plan needs to be updated    Co-evaluation                 AM-PAC OT "6 Clicks" Daily Activity     Outcome Measure   Help from another person eating meals?: None Help from another person taking care of personal grooming?: A Little Help from another person toileting, which includes using toliet, bedpan, or urinal?: A Lot Help from another person bathing (including washing, rinsing, drying)?: A Lot Help from another person to put on and taking off regular upper body clothing?: A Little Help from another person to put on and taking off regular lower body clothing?: A Lot 6 Click Score: 16    End of Session Equipment Utilized During Treatment: Gait belt;Rolling walker  OT Visit Diagnosis: Unsteadiness on feet (R26.81);Other abnormalities of gait and mobility (R26.89);Muscle weakness (generalized) (M62.81)   Activity Tolerance Patient tolerated treatment well   Patient Left in chair;with call bell/phone within reach;with chair alarm set   Nurse Communication Mobility status        Time: 6812-7517 OT Time Calculation (min): 26 min  Charges: OT General Charges $OT Visit: 1 Visit OT Treatments $Self Care/Home Management : 23-37 mins  [place], OTR/L Acute Rehab Pager:  [phone] Office: [place] 08/05/2018, 5:07 PM

## 2018-08-05 NOTE — Progress Notes (Addendum)
B/P 97/48 (63). 50mg  of Hydralazine scheduled. Paged Bonner Puna, MD. Orders placed to hold med and will discontinue.

## 2018-08-06 DIAGNOSIS — E119 Type 2 diabetes mellitus without complications: Secondary | ICD-10-CM

## 2018-08-06 DIAGNOSIS — I10 Essential (primary) hypertension: Secondary | ICD-10-CM

## 2018-08-06 DIAGNOSIS — I739 Peripheral vascular disease, unspecified: Secondary | ICD-10-CM

## 2018-08-06 LAB — COMPREHENSIVE METABOLIC PANEL
ALT: 39 U/L (ref 0–44)
AST: 56 U/L — ABNORMAL HIGH (ref 15–41)
Albumin: 3.2 g/dL — ABNORMAL LOW (ref 3.5–5.0)
Alkaline Phosphatase: 47 U/L (ref 38–126)
Anion gap: 9 (ref 5–15)
BUN: 26 mg/dL — ABNORMAL HIGH (ref 8–23)
CO2: 22 mmol/L (ref 22–32)
Calcium: 9.4 mg/dL (ref 8.9–10.3)
Chloride: 110 mmol/L (ref 98–111)
Creatinine, Ser: 1.08 mg/dL — ABNORMAL HIGH (ref 0.44–1.00)
GFR calc Af Amer: >60 mL/min (ref >60)
GFR calc non Af Amer: 53 mL/min — ABNORMAL LOW (ref >60)
Glucose, Bld: 105 mg/dL — ABNORMAL HIGH (ref 70–99)
Potassium: 3.9 mmol/L (ref 3.5–5.1)
Sodium: 141 mmol/L (ref 135–145)
Total Bilirubin: 0.1 mg/dL — ABNORMAL LOW (ref 0.3–1.2)
Total Protein: 7.2 g/dL (ref 6.5–8.1)

## 2018-08-06 LAB — CBC WITH DIFFERENTIAL/PLATELET
Abs Immature Granulocytes: 0.27 10*3/uL — ABNORMAL HIGH (ref 0.00–0.07)
Basophils Absolute: 0 10*3/uL (ref 0.0–0.1)
Basophils Relative: 1 %
Eosinophils Absolute: 0.1 10*3/uL (ref 0.0–0.5)
Eosinophils Relative: 2 %
HCT: 29.8 % — ABNORMAL LOW (ref 36.0–46.0)
Hemoglobin: 9.4 g/dL — ABNORMAL LOW (ref 12.0–15.0)
Immature Granulocytes: 4 %
Lymphocytes Relative: 30 %
Lymphs Abs: 2 10*3/uL (ref 0.7–4.0)
MCH: 24.4 pg — ABNORMAL LOW (ref 26.0–34.0)
MCHC: 31.5 g/dL (ref 30.0–36.0)
MCV: 77.4 fL — ABNORMAL LOW (ref 80.0–100.0)
Monocytes Absolute: 0.3 10*3/uL (ref 0.1–1.0)
Monocytes Relative: 4 %
Neutro Abs: 4 10*3/uL (ref 1.7–7.7)
Neutrophils Relative %: 59 %
Platelets: 465 10*3/uL — ABNORMAL HIGH (ref 150–400)
RBC: 3.85 MIL/uL — ABNORMAL LOW (ref 3.87–5.11)
RDW: 16 % — ABNORMAL HIGH (ref 11.5–15.5)
WBC: 6.6 10*3/uL (ref 4.0–10.5)
nRBC: 0 % (ref 0.0–0.2)

## 2018-08-06 LAB — GLUCOSE, CAPILLARY
Glucose-Capillary: 86 mg/dL (ref 70–99)
Glucose-Capillary: 116 mg/dL — ABNORMAL HIGH (ref 70–99)
Glucose-Capillary: 91 mg/dL (ref 70–99)
Glucose-Capillary: 99 mg/dL (ref 70–99)

## 2018-08-06 LAB — FERRITIN: Ferritin: 680 ng/mL — ABNORMAL HIGH (ref 11–307)

## 2018-08-06 LAB — C-REACTIVE PROTEIN: CRP: 14 mg/dL — ABNORMAL HIGH (ref <1.0)

## 2018-08-06 LAB — D-DIMER, QUANTITATIVE: D-Dimer, Quant: 1.59 ug/mL-FEU — ABNORMAL HIGH (ref 0.00–0.50)

## 2018-08-06 MED ORDER — ALPRAZOLAM 0.25 MG PO TABS: 0.2500 mg | ORAL_TABLET | Freq: Every day | ORAL | 0 refills | Status: DC | PRN

## 2018-08-06 MED ORDER — HYDROCODONE-ACETAMINOPHEN 5-325 MG PO TABS: 1.0000 | ORAL_TABLET | Freq: Four times a day (QID) | ORAL | 0 refills | Status: DC | PRN

## 2018-08-06 MED ORDER — ZOLPIDEM TARTRATE 10 MG PO TABS: 5.0000 mg | ORAL_TABLET | Freq: Every day | ORAL | Status: DC

## 2018-08-06 NOTE — Plan of Care (Signed)
Patient stable, discussed POC with patient, agreeable with plan for SNF, denies question/concerns at this time.

## 2018-08-06 NOTE — Progress Notes (Signed)
Occupational Therapy Treatment Patient Details Name: Joanne Morris MRN: [id number] DOB: 04-26-1950 Today's Date: 08/06/2018    History of present illness 68 yo female presenting with one week of not feeling well, right lower abdominal pain, fatigue, loss of smell, and three days of diarrhea. Pt testing COVID positive. PMH including DM type 2, CAD, diabetic kidney disease stage lll, and HTN.   OT comments  Pt presents seated in recliner pleasant and willing to participate in therapy session. Pt engaging in seated UB/LB exercise, taking rest breaks PRN. Pt performing sit<>stand and room level functional mobility using SPC with minguard-minA throughout. Pt with VSS on RA. Noted pt daughter with preference for pt to return to SNF vs home initially due to decreased ability to provide 24hr assist. Have updated discharge recommendations to reflect. Will continue to follow while pt remains in acute setting.    Follow Up Recommendations  SNF;Supervision/Assistance - 24 hour    Equipment Recommendations  3 in 1 bedside commode          Precautions / Restrictions Precautions Precautions: Fall Restrictions Weight Bearing Restrictions: No       Mobility Bed Mobility Overal bed mobility: Modified Independent             General bed mobility comments: pt received OOB in recliner  Transfers   Equipment used: Straight cane Transfers: Sit to/from Stand Sit to Stand: Min guard;Min assist         General transfer comment: minguard initially with use of armrests; progressed to minA as pt performing x5 sit<>stand and practicing doing so without use of UEs    Balance   Sitting-balance support: Feet supported;No upper extremity supported Sitting balance-Leahy Scale: Good     Standing balance support: No upper extremity supported;During functional activity Standing balance-Leahy Scale: Fair Standing balance comment: dynamic is poor if not using 1 support                            ADL either performed or assessed with clinical judgement   ADL Overall ADL's : Needs assistance/impaired                                     Functional mobility during ADLs: Minimal assistance;Cane General ADL Comments: pt declined need to perform ADL this PM, agreeable to UB/LB exercise and practicing room level functional mobility using SPC                        Cognition Arousal/Alertness: Awake/alert Behavior During Therapy: Flat affect Overall Cognitive Status: Within Functional Limits for tasks assessed                                          Exercises Exercises: General Upper Extremity;General Lower Extremity;Other exercises General Exercises - Upper Extremity Shoulder Flexion: AROM;AAROM;Both;10 reps Elbow Flexion: AROM;Both;10 reps Elbow Extension: AROM;Both;10 reps General Exercises - Lower Extremity Ankle Circles/Pumps: AROM;Both;10 reps Long Arc Quad: AROM;Both;10 reps;Seated Hip ABduction/ADduction: AROM;Both;10 reps;Seated Hip Flexion/Marching: AROM;Seated;Both;10 reps Other Exercises Other Exercises: seated chest press, bil UE x10, AROM Other Exercises: sit<>stand x5 from recliner    Shoulder Instructions       General Comments VSS on RA    Pertinent Vitals/ Pain  Pain Assessment: No/denies pain  Home Living                                          Prior Functioning/Environment              Frequency  Min 2X/week        Progress Toward Goals  OT Goals(current goals can now be found in the care plan section)  Progress towards OT goals: Progressing toward goals  Acute Rehab OT Goals Patient Stated Goal: "Get better" OT Goal Formulation: With patient Time For Goal Achievement: 08/17/18 Potential to Achieve Goals: Good  Plan Discharge plan needs to be updated    Co-evaluation                 AM-PAC OT "6 Clicks" Daily Activity     Outcome Measure   Help  from another person eating meals?: None Help from another person taking care of personal grooming?: A Little Help from another person toileting, which includes using toliet, bedpan, or urinal?: A Lot Help from another person bathing (including washing, rinsing, drying)?: A Lot Help from another person to put on and taking off regular upper body clothing?: A Little Help from another person to put on and taking off regular lower body clothing?: A Lot 6 Click Score: 16    End of Session Equipment Utilized During Treatment: Other (comment)(SPC)  OT Visit Diagnosis: Unsteadiness on feet (R26.81);Other abnormalities of gait and mobility (R26.89);Muscle weakness (generalized) (M62.81)   Activity Tolerance Patient tolerated treatment well   Patient Left in chair;with call bell/phone within reach;with chair alarm set   Nurse Communication Mobility status        Time: 1941-7408 OT Time Calculation (min): 20 min  Charges: OT General Charges $OT Visit: 1 Visit OT Treatments $Therapeutic Activity: 8-22 mins  Lou Cal, OT [facility] Pager [phone] Office [phone]   Raymondo Band 08/06/2018, 4:47 PM

## 2018-08-06 NOTE — Progress Notes (Signed)
Physical Therapy Treatment Patient Details Name: Joanne Morris MRN: [id number] DOB: 1951/01/30 Today's Date: 08/06/2018    History of Present Illness 68 yo female presenting with one week of not feeling well, right lower abdominal pain, fatigue, loss of smell, and three days of diarrhea. Pt testing COVID positive. PMH including DM type 2, CAD, diabetic kidney disease stage lll, and HTN.    PT Comments    The patient ambulated with single  Point cane, required steady assist for balance losses when turning head and turning around. Recommend SNF for rehab,   Follow Up Recommendations  SNF     Equipment Recommendations  None recommended by PT    Recommendations for Other Services       Precautions / Restrictions Precautions Precautions: Fall    Mobility  Bed Mobility Overal bed mobility: Modified Independent             General bed mobility comments: with rail and incr time  Transfers   Equipment used: Straight cane Transfers: Sit to/from Stand Sit to Stand: Min guard         General transfer comment: use  of bed rail to steady self to stand, Cane on left  Ambulation/Gait Ambulation/Gait assistance: Herbalist (Feet): 90 Feet Assistive device: Straight cane Gait Pattern/deviations: Drifts right/left   Gait velocity interpretation: <1.8 ft/sec, indicate of risk for recurrent falls General Gait Details: slow speed, assist with turning, balance losses when turns head to look to side using SPC.  HR 94%, HR 104   Stairs             Wheelchair Mobility    Modified Rankin (Stroke Patients Only)       Balance   Sitting-balance support: Feet supported;No upper extremity supported Sitting balance-Leahy Scale: Good     Standing balance support: No upper extremity supported;During functional activity Standing balance-Leahy Scale: Fair Standing balance comment: dynamic is poor if not using 1 support                             Cognition Arousal/Alertness: Awake/alert Behavior During Therapy: Flat affect Overall Cognitive Status: Within Functional Limits for tasks assessed                                        Exercises General Exercises - Lower Extremity Ankle Circles/Pumps: AROM;Both;10 reps Long Arc Quad: AROM;Both;10 reps;Seated Hip ABduction/ADduction: AROM;Both;10 reps;Seated Hip Flexion/Marching: AROM;Seated;Both;10 reps    General Comments        Pertinent Vitals/Pain Pain Assessment: No/denies pain    Home Living                      Prior Function            PT Goals (current goals can now be found in the care plan section) Progress towards PT goals: Progressing toward goals    Frequency    Min 3X/week      PT Plan Current plan remains appropriate    Co-evaluation              AM-PAC PT "6 Clicks" Mobility   Outcome Measure  Help needed turning from your back to your side while in a flat bed without using bedrails?: None Help needed moving from lying on your back to sitting on the side of a flat  bed without using bedrails?: None Help needed moving to and from a bed to a chair (including a wheelchair)?: A Little Help needed standing up from a chair using your arms (e.g., wheelchair or bedside chair)?: A Little Help needed to walk in hospital room?: A Little Help needed climbing 3-5 steps with a railing? : A Lot 6 Click Score: 19    End of Session Equipment Utilized During Treatment: Gait belt Activity Tolerance: Patient tolerated treatment well Patient left: in chair;with call bell/phone within reach;with chair alarm set Nurse Communication: Mobility status       Time: 1443-1501 PT Time Calculation (min) (ACUTE ONLY): 18 min  Charges:  $Gait Training: 8-22 mins                     [place] Pager [phone] Office [phone]    Claretha Cooper 08/06/2018, 3:11 PM

## 2018-08-06 NOTE — Discharge Summary (Signed)
Physician Discharge Summary  Joanne Morris DOB: 1950-11-02 DOA: 07/31/2018  PCP: [name], NP  Admit date: 07/31/2018 Discharge date: 08/06/2018  Admitted From: Home Disposition: SNF   Recommendations for Outpatient Follow-up:  1. Follow up with PCP in 1-2 weeks 2. Please obtain CMP/CBC in one week  Home Health: N/A Equipment/Devices: Per SNF Discharge Condition: Stable CODE STATUS: Full Diet recommendation: Heart healthy, carb-modified  Brief/Interim Summary: Joanne Morris is a 68 y.o. female with a history of CAD, T2DM, stage III CKD and resistant HTN who presented to the ED 6/19 with 1 week of fatigue, malaise, and loose stools. She was found to be covid positive with hypotension and renal failure. CXR showed modest bilateral infiltrates. She's had no hypoxia at rest, but has had some hypoxia with exertion and tachypnea which have since improved. Creatinine has improved with IV fluids. PT and OT recommend disposition to SNF due to acute deconditioning.   Discharge Diagnoses:  Principal Problem:   Acute kidney injury superimposed on CKD ([place]) Active Problems:   CAD (coronary artery disease)   HTN (hypertension)   DM2 (diabetes mellitus, type 2) ([place])   Peripheral arterial disease ([place])   COVID-19 virus infection  Covid-19 infection without pneumonia:  - CRP remains grossly elevated but declining. - No remdesivir, steroids, or actemra were given with no hypoxia.  - Avoid NSAIDs including home prn mobic.  ARF on stage III CKD with NAGMA, AOCD: Cr 5.7 at OSH on admission with baseline Cr ~1.1 making CrCl ~7ml/min. Hemodynamically mediated in addition to ARB since improved.  - Continue holding nephrotoxins. Recheck creatinine  Resistant HTN: Was hypotensive on initial presentation and normotensive currently despite holding some home BP medications still.  - Will recommend continuing metoprolol, valsartan, HCTZ and norvasc, but recommend stopping  hydralazine 50mg  TID for now to avoid hypotension but may be added back If needed.   T2DM:  - Will hold metformin at discharge due to renal dysfunction, HbA1c 5.8%, and well-controlled glucose with no metformin or significant insulin.  CAD:  - Continue BB, ASA, statin  GERD:  - Continue PPI  Anxiety:  - Continue xanax, SSRI  Chronic pain: Continue home prn's but consider deescalating sedating medications if possible (e.g. flexeril, tizanidine)  Insomnia: Continue ambien home medication, but recommend decreasing to 5mg  dose due to female and age >67.   Discharge Instructions Discharge Instructions    Diet - low sodium heart healthy   Complete by: As directed    Diet Carb Modified   Complete by: As directed    MyChart COVID-19 home monitoring program   Complete by: Aug 06, 2018    Is the patient willing to use the [place] for home monitoring?: Yes   Temperature monitoring   Complete by: Aug 06, 2018    After how many days would you like to receive a notification of this patient's flowsheet entries?: 1     Allergies as of 08/06/2018      Reactions   No Known Allergies       Medication List    STOP taking these medications   hydrALAZINE 50 MG tablet Commonly known as: APRESOLINE   meloxicam 7.5 MG tablet Commonly known as: MOBIC   metFORMIN 500 MG tablet Commonly known as: GLUCOPHAGE   predniSONE 10 MG tablet Commonly known as: DELTASONE     TAKE these medications   ALPRAZolam 0.25 MG tablet Commonly known as: XANAX Take 1 tablet (0.25 mg total) by mouth daily as needed  for anxiety.   amLODipine 10 MG tablet Commonly known as: NORVASC Take 10 mg by mouth daily.   aspirin EC 81 MG tablet Take 81 mg by mouth daily.   [employer] Purelax 17 GM/SCOOP powder Generic drug: polyethylene glycol powder Take 17 g by mouth daily as needed for constipation.   cyclobenzaprine 10 MG tablet Commonly known as: FLEXERIL Take 5 mg by mouth 3 (three) times daily  as needed for muscle spasms.   ferrous sulfate 325 (65 FE) MG EC tablet Take 325 mg by mouth every Sunday.   HYDROcodone-acetaminophen 5-325 MG tablet Commonly known as: Norco Take 1-2 tablets by mouth every 6 (six) hours as needed for moderate pain or severe pain. What changed: reasons to take this   metoprolol tartrate 100 MG tablet Commonly known as: LOPRESSOR Take 100 mg by mouth 2 (two) times daily.   niacin 1000 MG CR tablet Commonly known as: NIASPAN Take 1,000 mg by mouth daily.   nitroGLYCERIN 0.4 MG SL tablet Commonly known as: NITROSTAT Place 0.4 mg under the tongue every 5 (five) minutes as needed for chest pain.   omeprazole 20 MG capsule Commonly known as: PRILOSEC Take 20 mg by mouth daily.   pravastatin 40 MG tablet Commonly known as: PRAVACHOL Take 40 mg by mouth daily.   ranitidine 150 MG tablet Commonly known as: ZANTAC Take 150 mg by mouth 2 (two) times daily as needed for heartburn.   sertraline 50 MG tablet Commonly known as: ZOLOFT Take 50 mg by mouth every evening.   tiZANidine 4 MG tablet Commonly known as: ZANAFLEX Take 4 mg by mouth at bedtime as needed for muscle spasms.   valsartan-hydrochlorothiazide 320-25 MG tablet Commonly known as: DIOVAN-HCT Take 1 tablet by mouth daily.   Vitamin D3 1.25 MG (50000 UT) Caps Take 1 capsule by mouth every Sunday.   zolpidem 10 MG tablet Commonly known as: AMBIEN Take 0.5 tablets (5 mg total) by mouth at bedtime. What changed: how much to take      Follow-up Information    [name], NP Follow up.   Specialty: Family Medicine Contact information: [address] [place] [address] [phone]          Allergies  Allergen Reactions  . No Known Allergies     Consultations:  None  Procedures/Studies: CXR 07/31/2018 with R > L peripheral GGOs, cardiomegaly.   Subjective: Feels well, comfortable, no dyspnea or pain. Eating ok. Still weaker than usual diffusely  without focal weakness or numbness.   Discharge Exam: Vitals:   08/06/18 0720 08/06/18 1144  BP: (!) 113/55 118/69  Pulse: 75 82  Resp: (!) 23 20  Temp: 98.1 F (36.7 C) 98.1 F (36.7 C)  SpO2: 92%    General: Pt is alert, awake, not in acute distress Cardiovascular: RRR, S1/S2 +, no rubs, no gallops Respiratory: CTA bilaterally, no wheezing, no rhonchi Abdominal: Soft, NT, ND, bowel sounds + Extremities: No edema, no cyanosis  Labs: BNP (last 3 results) No results for input(s): BNP in the last 8760 hours. Basic Metabolic Panel: Recent Labs  Lab 08/02/18 0250 08/03/18 0243 08/04/18 0508 08/05/18 0410 08/06/18 0525  NA 140 140 138 138 141  K 3.7 4.1 3.9 3.8 3.9  CL 110 109 106 107 110  CO2 19* 20* 20* 23 22  GLUCOSE 88 66* 101* 86 105*  BUN 17 17 22  25* 26*  CREATININE 1.07* 1.07* 1.17* 1.11* 1.08*  CALCIUM 8.8* 9.0 8.9 8.8* 9.4  Liver Function Tests: Recent Labs  Lab 08/02/18 0250 08/03/18 0243 08/04/18 0508 08/05/18 0410 08/06/18 0525  AST 53* 43* 47* 59* 56*  ALT 27 25 27  34 39  ALKPHOS 43 42 46 44 47  BILITOT 0.5 0.1* 0.4 0.2* 0.1*  PROT 7.1 7.0 7.1 6.7 7.2  ALBUMIN 3.8 3.5 3.4* 3.1* 3.2*   No results for input(s): LIPASE, AMYLASE in the last 168 hours. No results for input(s): AMMONIA in the last 168 hours. CBC: Recent Labs  Lab 08/02/18 0250 08/03/18 0243 08/04/18 0508 08/05/18 0410 08/06/18 0525  WBC 5.7 8.3 8.6 6.3 6.6  NEUTROABS 4.0 6.3 6.7 4.6 4.0  HGB 9.9* 10.3* 9.7* 9.0* 9.4*  HCT 31.6* 32.0* 30.1* 29.3* 29.8*  MCV 76.0* 75.7* 75.8* 76.7* 77.4*  PLT 310 322 353 402* 465*   Cardiac Enzymes: No results for input(s): CKTOTAL, CKMB, CKMBINDEX, TROPONINI in the last 168 hours. BNP: Invalid input(s): POCBNP CBG: Recent Labs  Lab 08/05/18 0816 08/05/18 1158 08/05/18 1559 08/05/18 2027 08/06/18 0824  GLUCAP 124* 99 101* 117* 86   D-Dimer Recent Labs    08/05/18 0410 08/06/18 0525  DDIMER 1.69* 1.59*   Hgb A1c No results  for input(s): HGBA1C in the last 72 hours. Lipid Profile No results for input(s): CHOL, HDL, LDLCALC, TRIG, CHOLHDL, LDLDIRECT in the last 72 hours. Thyroid function studies No results for input(s): TSH, T4TOTAL, T3FREE, THYROIDAB in the last 72 hours.  Invalid input(s): FREET3 Anemia work up Recent Labs    08/05/18 0410 08/06/18 0525  FERRITIN 648* 680*   Urinalysis    Component Value Date/Time   COLORURINE YELLOW 08/01/2018 2243   APPEARANCEUR CLEAR 08/01/2018 2243   LABSPEC 1.011 08/01/2018 2243   PHURINE 5.0 08/01/2018 2243   GLUCOSEU NEGATIVE 08/01/2018 2243   HGBUR SMALL (A) 08/01/2018 2243   BILIRUBINUR NEGATIVE 08/01/2018 2243   KETONESUR NEGATIVE 08/01/2018 2243   PROTEINUR 100 (A) 08/01/2018 2243   UROBILINOGEN 0.2 04/06/2014 0834   NITRITE NEGATIVE 08/01/2018 2243   LEUKOCYTESUR LARGE (A) 08/01/2018 2243    Microbiology No results found for this or any previous visit (from the past 240 hour(s)).  Time coordinating discharge: Approximately 40 minutes  Patrecia Pour, MD  [facility] 08/06/2018, 11:51 AM

## 2018-08-06 NOTE — Progress Notes (Signed)
  [facility] accepted and made bed offer, insurance auth with [employer] has been initiated by facility. Pending insurance auth for patient's discharge.   [place], [place]

## 2018-08-07 DIAGNOSIS — R2689 Other abnormalities of gait and mobility: Secondary | ICD-10-CM | POA: Diagnosis not present

## 2018-08-07 DIAGNOSIS — R41841 Cognitive communication deficit: Secondary | ICD-10-CM | POA: Diagnosis not present

## 2018-08-07 DIAGNOSIS — E118 Type 2 diabetes mellitus with unspecified complications: Secondary | ICD-10-CM | POA: Diagnosis not present

## 2018-08-07 DIAGNOSIS — M2550 Pain in unspecified joint: Secondary | ICD-10-CM | POA: Diagnosis not present

## 2018-08-07 DIAGNOSIS — F39 Unspecified mood [affective] disorder: Secondary | ICD-10-CM | POA: Diagnosis not present

## 2018-08-07 DIAGNOSIS — I10 Essential (primary) hypertension: Secondary | ICD-10-CM | POA: Diagnosis not present

## 2018-08-07 DIAGNOSIS — I7389 Other specified peripheral vascular diseases: Secondary | ICD-10-CM | POA: Diagnosis not present

## 2018-08-07 DIAGNOSIS — R278 Other lack of coordination: Secondary | ICD-10-CM | POA: Diagnosis not present

## 2018-08-07 DIAGNOSIS — R2681 Unsteadiness on feet: Secondary | ICD-10-CM | POA: Diagnosis not present

## 2018-08-07 DIAGNOSIS — U071 COVID-19: Secondary | ICD-10-CM | POA: Diagnosis not present

## 2018-08-07 DIAGNOSIS — M6281 Muscle weakness (generalized): Secondary | ICD-10-CM | POA: Diagnosis not present

## 2018-08-07 DIAGNOSIS — N178 Other acute kidney failure: Secondary | ICD-10-CM | POA: Diagnosis not present

## 2018-08-07 DIAGNOSIS — R5381 Other malaise: Secondary | ICD-10-CM | POA: Diagnosis not present

## 2018-08-07 DIAGNOSIS — G4709 Other insomnia: Secondary | ICD-10-CM | POA: Diagnosis not present

## 2018-08-07 DIAGNOSIS — I129 Hypertensive chronic kidney disease with stage 1 through stage 4 chronic kidney disease, or unspecified chronic kidney disease: Secondary | ICD-10-CM | POA: Diagnosis not present

## 2018-08-07 DIAGNOSIS — Z7401 Bed confinement status: Secondary | ICD-10-CM | POA: Diagnosis not present

## 2018-08-07 LAB — GLUCOSE, CAPILLARY
Glucose-Capillary: 88 mg/dL (ref 70–99)
Glucose-Capillary: 111 mg/dL — ABNORMAL HIGH (ref 70–99)

## 2018-08-07 NOTE — Progress Notes (Addendum)
1323  Report called to [facility].    [facility] here to transport patient to  [facility].

## 2018-08-07 NOTE — TOC Transition Note (Addendum)
Transition of Care [facility]) - CM/SW Discharge Note   Patient Details  Name: Joanne Morris MRN: [id number] Date of Birth: 18-Nov-1950  Transition of Care [facility]) CM/SW Contact:  Alberteen Sam, LCSW Phone Number: 08/07/2018, 12:46 PM   Clinical Narrative:     Patient will DC to: [facility] Anticipated DC date: 08/07/2018 Family notified: Andria Frames Transport by: Corey Harold  Per MD patient ready for DC to [facility]. RN, patient, patient's family, and facility notified of DC. Discharge Summary sent to facility. RN given number for report  [phone] Room 806A. DC packet on chart. Ambulance transport requested for patient for 2:00 pm per nurse request. [place] signing off.  [place], [place]   Final next level of care: Skilled Nursing Facility Barriers to Discharge: No Barriers Identified   Patient Goals and CMS Choice Patient states their goals for this hospitalization and ongoing recovery are:: to go to rehab before going home CMS Medicare.gov Compare Post Acute Care list provided to:: Patient Represenative (must comment)([place]) Choice offered to / list presented to : Adult Children([place])  Discharge Placement PASRR number recieved: 08/05/18            Patient chooses bed at: [facility] Patient to be transferred to facility by: [place] Name of family member notified: Andria Frames Patient and family notified of of transfer: 08/07/18  Discharge Plan and Services     Post Acute Care Choice: [place]                               Social Determinants of Health (SDOH) Interventions     Readmission Risk Interventions No flowsheet data found.

## 2018-08-07 NOTE — Plan of Care (Signed)

## 2018-08-07 NOTE — Consult Note (Signed)
   [facility] Inpatient Consult   08/07/2018  Agata Lucente 1951-01-11 [id number]   Screened for post hospital follow up needs.  Patient is noted to be [employer] SNP. Patient will be followed by external care management team for needs. Patient went to SNF. Roster checked for [employer]/Medicaid.  Will sign off.  Natividad Brood, RN BSN [facility] Liaison  [phone] business mobile phone Toll free office [phone]  Fax number: [phone] [email or web address]

## 2018-08-07 NOTE — Progress Notes (Signed)
Patient seen and examined this morning. Was discharged to SNF after bed availability confirmed pending insurance authorization. Had not been discharged. States there are no new complaints, feels she's regaining strength. No dyspnea or chest pain or other pain. Lungs are clear on exam, no hypoxia and otherwise stable vital signs.   The patient remains stable for discharge. No changes needed to discharge summary dated 08/07/2018.   Vance Gather, MD 08/07/2018 9:15 AM

## 2018-08-13 ENCOUNTER — Other Ambulatory Visit: Payer: Self-pay

## 2018-08-18 ENCOUNTER — Other Ambulatory Visit: Payer: Self-pay

## 2018-08-18 NOTE — Patient Outreach (Signed)
[facility]) Care Management  08/18/2018  Joanne Morris Sep 12, 1950 [id number]  Referral Date: 08/13/2018 Referral Source: [employer] Report Date of Admission: 08/07/2018 Diagnosis:  COVID-19 Date of Discharge: 08/12/2018 Facility: [place]: [employer]  Outreach attempt: Spoke with patient.  She is able to verify HIPAA.  Patient states she is doing good just kind of laying around the house.  She states that she is able to care for herself and does not require any assistance. She mentions that her daughter visits daily and helps when needed.    Social: Patient states she lives alone but has the help of her daughter if needed, who visits daily.    Conditions: history of CAD, T2DM, stage III CKD and resistant HTN.  Patient recently hospitalized with fatigue, malaise, and loose stools.  She was COVID positive with hypotension and renal failure.  Patient sent to rehab facility due to deconditioning.  Medications: Patient reports no problems with medications and she is able to review medications.    Appointments: Patient states she has an appointment with her PCP next week and that she has transportation through [facility]  Consent: RN CM reviewed [facility] services with patient. Patient declined services.  Offered follow up calls by nurse and patient declined.  Patient given CM contact information for future reference.    Plan: RN CM will close case.     Jone Baseman, RN, MSN [facility] Care Management Care Management Coordinator Direct Line [phone] Toll Free: [phone]  Fax: [phone]

## 2018-08-20 ENCOUNTER — Ambulatory Visit: Payer: Medicaid Other

## 2018-08-24 DIAGNOSIS — Z79899 Other long term (current) drug therapy: Secondary | ICD-10-CM | POA: Diagnosis not present

## 2018-08-24 DIAGNOSIS — Z09 Encounter for follow-up examination after completed treatment for conditions other than malignant neoplasm: Secondary | ICD-10-CM | POA: Diagnosis not present

## 2018-08-24 DIAGNOSIS — U071 COVID-19: Secondary | ICD-10-CM | POA: Diagnosis not present

## 2018-08-25 DIAGNOSIS — U071 COVID-19: Secondary | ICD-10-CM | POA: Diagnosis not present

## 2018-08-26 DIAGNOSIS — M791 Myalgia, unspecified site: Secondary | ICD-10-CM | POA: Diagnosis not present

## 2018-08-27 ENCOUNTER — Ambulatory Visit: Payer: Self-pay | Admitting: Cardiology

## 2018-08-31 DIAGNOSIS — K5909 Other constipation: Secondary | ICD-10-CM | POA: Diagnosis not present

## 2018-08-31 DIAGNOSIS — E663 Overweight: Secondary | ICD-10-CM | POA: Diagnosis not present

## 2018-08-31 DIAGNOSIS — R1030 Lower abdominal pain, unspecified: Secondary | ICD-10-CM | POA: Diagnosis not present

## 2018-09-09 DIAGNOSIS — R52 Pain, unspecified: Secondary | ICD-10-CM | POA: Diagnosis not present

## 2018-09-14 DIAGNOSIS — N183 Chronic kidney disease, stage 3 (moderate): Secondary | ICD-10-CM | POA: Diagnosis not present

## 2018-09-14 DIAGNOSIS — I10 Essential (primary) hypertension: Secondary | ICD-10-CM | POA: Diagnosis not present

## 2018-09-14 DIAGNOSIS — E782 Mixed hyperlipidemia: Secondary | ICD-10-CM | POA: Diagnosis not present

## 2018-09-14 DIAGNOSIS — D509 Iron deficiency anemia, unspecified: Secondary | ICD-10-CM | POA: Diagnosis not present

## 2018-09-14 DIAGNOSIS — F331 Major depressive disorder, recurrent, moderate: Secondary | ICD-10-CM | POA: Diagnosis not present

## 2018-09-14 DIAGNOSIS — E1169 Type 2 diabetes mellitus with other specified complication: Secondary | ICD-10-CM | POA: Diagnosis not present

## 2018-09-14 DIAGNOSIS — E559 Vitamin D deficiency, unspecified: Secondary | ICD-10-CM | POA: Diagnosis not present

## 2018-09-14 DIAGNOSIS — I739 Peripheral vascular disease, unspecified: Secondary | ICD-10-CM | POA: Diagnosis not present

## 2018-09-14 DIAGNOSIS — I2510 Atherosclerotic heart disease of native coronary artery without angina pectoris: Secondary | ICD-10-CM | POA: Diagnosis not present

## 2018-10-07 DIAGNOSIS — Z6828 Body mass index (BMI) 28.0-28.9, adult: Secondary | ICD-10-CM | POA: Diagnosis not present

## 2018-10-07 DIAGNOSIS — R52 Pain, unspecified: Secondary | ICD-10-CM | POA: Diagnosis not present

## 2018-10-07 DIAGNOSIS — K5909 Other constipation: Secondary | ICD-10-CM | POA: Diagnosis not present

## 2018-10-12 DIAGNOSIS — Z6827 Body mass index (BMI) 27.0-27.9, adult: Secondary | ICD-10-CM | POA: Diagnosis not present

## 2018-10-12 DIAGNOSIS — R52 Pain, unspecified: Secondary | ICD-10-CM | POA: Diagnosis not present

## 2018-10-12 DIAGNOSIS — I739 Peripheral vascular disease, unspecified: Secondary | ICD-10-CM | POA: Diagnosis not present

## 2018-10-19 DIAGNOSIS — M545 Low back pain: Secondary | ICD-10-CM | POA: Diagnosis not present

## 2018-10-19 DIAGNOSIS — S335XXA Sprain of ligaments of lumbar spine, initial encounter: Secondary | ICD-10-CM | POA: Diagnosis not present

## 2018-11-12 DIAGNOSIS — R52 Pain, unspecified: Secondary | ICD-10-CM | POA: Diagnosis not present

## 2018-11-12 DIAGNOSIS — K5909 Other constipation: Secondary | ICD-10-CM | POA: Diagnosis not present

## 2018-11-12 DIAGNOSIS — G4700 Insomnia, unspecified: Secondary | ICD-10-CM | POA: Diagnosis not present

## 2018-11-12 DIAGNOSIS — Z6827 Body mass index (BMI) 27.0-27.9, adult: Secondary | ICD-10-CM | POA: Diagnosis not present

## 2018-11-15 DIAGNOSIS — B9689 Other specified bacterial agents as the cause of diseases classified elsewhere: Secondary | ICD-10-CM | POA: Diagnosis not present

## 2018-11-15 DIAGNOSIS — N3000 Acute cystitis without hematuria: Secondary | ICD-10-CM | POA: Diagnosis not present

## 2018-11-15 DIAGNOSIS — M5136 Other intervertebral disc degeneration, lumbar region: Secondary | ICD-10-CM | POA: Diagnosis not present

## 2018-12-02 DIAGNOSIS — N959 Unspecified menopausal and perimenopausal disorder: Secondary | ICD-10-CM | POA: Diagnosis not present

## 2018-12-02 DIAGNOSIS — Z0000 Encounter for general adult medical examination without abnormal findings: Secondary | ICD-10-CM | POA: Diagnosis not present

## 2018-12-02 DIAGNOSIS — E785 Hyperlipidemia, unspecified: Secondary | ICD-10-CM | POA: Diagnosis not present

## 2018-12-02 DIAGNOSIS — Z1331 Encounter for screening for depression: Secondary | ICD-10-CM | POA: Diagnosis not present

## 2018-12-02 DIAGNOSIS — Z1231 Encounter for screening mammogram for malignant neoplasm of breast: Secondary | ICD-10-CM | POA: Diagnosis not present

## 2018-12-02 DIAGNOSIS — Z9181 History of falling: Secondary | ICD-10-CM | POA: Diagnosis not present

## 2018-12-11 DIAGNOSIS — R109 Unspecified abdominal pain: Secondary | ICD-10-CM | POA: Diagnosis not present

## 2018-12-11 DIAGNOSIS — L910 Hypertrophic scar: Secondary | ICD-10-CM | POA: Diagnosis not present

## 2018-12-11 DIAGNOSIS — Z23 Encounter for immunization: Secondary | ICD-10-CM | POA: Diagnosis not present

## 2018-12-11 DIAGNOSIS — R52 Pain, unspecified: Secondary | ICD-10-CM | POA: Diagnosis not present

## 2018-12-17 DIAGNOSIS — R109 Unspecified abdominal pain: Secondary | ICD-10-CM | POA: Diagnosis not present

## 2018-12-17 DIAGNOSIS — N200 Calculus of kidney: Secondary | ICD-10-CM | POA: Diagnosis not present

## 2018-12-25 DIAGNOSIS — N183 Chronic kidney disease, stage 3 unspecified: Secondary | ICD-10-CM | POA: Diagnosis not present

## 2018-12-25 DIAGNOSIS — D509 Iron deficiency anemia, unspecified: Secondary | ICD-10-CM | POA: Diagnosis not present

## 2018-12-25 DIAGNOSIS — I10 Essential (primary) hypertension: Secondary | ICD-10-CM | POA: Diagnosis not present

## 2018-12-25 DIAGNOSIS — I2510 Atherosclerotic heart disease of native coronary artery without angina pectoris: Secondary | ICD-10-CM | POA: Diagnosis not present

## 2018-12-25 DIAGNOSIS — E1169 Type 2 diabetes mellitus with other specified complication: Secondary | ICD-10-CM | POA: Diagnosis not present

## 2018-12-25 DIAGNOSIS — E782 Mixed hyperlipidemia: Secondary | ICD-10-CM | POA: Diagnosis not present

## 2018-12-25 DIAGNOSIS — F331 Major depressive disorder, recurrent, moderate: Secondary | ICD-10-CM | POA: Diagnosis not present

## 2018-12-25 DIAGNOSIS — I739 Peripheral vascular disease, unspecified: Secondary | ICD-10-CM | POA: Diagnosis not present

## 2018-12-25 DIAGNOSIS — E559 Vitamin D deficiency, unspecified: Secondary | ICD-10-CM | POA: Diagnosis not present

## 2018-12-28 DIAGNOSIS — R109 Unspecified abdominal pain: Secondary | ICD-10-CM | POA: Diagnosis not present

## 2019-01-04 DIAGNOSIS — I358 Other nonrheumatic aortic valve disorders: Secondary | ICD-10-CM | POA: Insufficient documentation

## 2019-01-04 DIAGNOSIS — E785 Hyperlipidemia, unspecified: Secondary | ICD-10-CM | POA: Diagnosis not present

## 2019-01-04 DIAGNOSIS — Z955 Presence of coronary angioplasty implant and graft: Secondary | ICD-10-CM | POA: Diagnosis not present

## 2019-01-04 DIAGNOSIS — I2510 Atherosclerotic heart disease of native coronary artery without angina pectoris: Secondary | ICD-10-CM | POA: Diagnosis not present

## 2019-01-04 DIAGNOSIS — I252 Old myocardial infarction: Secondary | ICD-10-CM | POA: Diagnosis not present

## 2019-01-04 DIAGNOSIS — Z9889 Other specified postprocedural states: Secondary | ICD-10-CM | POA: Diagnosis not present

## 2019-01-14 DIAGNOSIS — N2889 Other specified disorders of kidney and ureter: Secondary | ICD-10-CM | POA: Diagnosis not present

## 2019-01-14 DIAGNOSIS — R109 Unspecified abdominal pain: Secondary | ICD-10-CM | POA: Diagnosis not present

## 2019-01-14 DIAGNOSIS — M545 Low back pain: Secondary | ICD-10-CM | POA: Diagnosis not present

## 2019-01-14 DIAGNOSIS — N200 Calculus of kidney: Secondary | ICD-10-CM | POA: Diagnosis not present

## 2019-01-14 DIAGNOSIS — Q6210 Congenital occlusion of ureter, unspecified: Secondary | ICD-10-CM | POA: Diagnosis not present

## 2019-01-14 DIAGNOSIS — G8929 Other chronic pain: Secondary | ICD-10-CM | POA: Diagnosis not present

## 2019-01-14 DIAGNOSIS — Z79899 Other long term (current) drug therapy: Secondary | ICD-10-CM | POA: Diagnosis not present

## 2019-01-19 DIAGNOSIS — I2510 Atherosclerotic heart disease of native coronary artery without angina pectoris: Secondary | ICD-10-CM | POA: Diagnosis not present

## 2019-02-09 DIAGNOSIS — R109 Unspecified abdominal pain: Secondary | ICD-10-CM | POA: Diagnosis not present

## 2019-02-18 DIAGNOSIS — N390 Urinary tract infection, site not specified: Secondary | ICD-10-CM | POA: Diagnosis not present

## 2019-02-18 DIAGNOSIS — Q6210 Congenital occlusion of ureter, unspecified: Secondary | ICD-10-CM | POA: Diagnosis not present

## 2019-02-18 DIAGNOSIS — N200 Calculus of kidney: Secondary | ICD-10-CM | POA: Diagnosis not present

## 2019-02-25 DIAGNOSIS — N200 Calculus of kidney: Secondary | ICD-10-CM | POA: Diagnosis not present

## 2019-03-10 DIAGNOSIS — R109 Unspecified abdominal pain: Secondary | ICD-10-CM | POA: Diagnosis not present

## 2019-03-10 DIAGNOSIS — G4700 Insomnia, unspecified: Secondary | ICD-10-CM | POA: Diagnosis not present

## 2019-03-30 ENCOUNTER — Encounter (HOSPITAL_COMMUNITY): Payer: Medicaid Other

## 2019-03-30 ENCOUNTER — Encounter: Payer: Medicaid Other | Admitting: Vascular Surgery

## 2019-03-31 DIAGNOSIS — Q6210 Congenital occlusion of ureter, unspecified: Secondary | ICD-10-CM | POA: Diagnosis not present

## 2019-03-31 DIAGNOSIS — N200 Calculus of kidney: Secondary | ICD-10-CM | POA: Diagnosis not present

## 2019-03-31 DIAGNOSIS — R1012 Left upper quadrant pain: Secondary | ICD-10-CM | POA: Diagnosis not present

## 2019-03-31 DIAGNOSIS — R109 Unspecified abdominal pain: Secondary | ICD-10-CM | POA: Diagnosis not present

## 2019-03-31 DIAGNOSIS — R1011 Right upper quadrant pain: Secondary | ICD-10-CM | POA: Diagnosis not present

## 2019-04-12 DIAGNOSIS — D509 Iron deficiency anemia, unspecified: Secondary | ICD-10-CM | POA: Diagnosis not present

## 2019-04-12 DIAGNOSIS — E1169 Type 2 diabetes mellitus with other specified complication: Secondary | ICD-10-CM | POA: Diagnosis not present

## 2019-04-12 DIAGNOSIS — I10 Essential (primary) hypertension: Secondary | ICD-10-CM | POA: Diagnosis not present

## 2019-04-12 DIAGNOSIS — N183 Chronic kidney disease, stage 3 unspecified: Secondary | ICD-10-CM | POA: Diagnosis not present

## 2019-04-12 DIAGNOSIS — E782 Mixed hyperlipidemia: Secondary | ICD-10-CM | POA: Diagnosis not present

## 2019-04-12 DIAGNOSIS — E559 Vitamin D deficiency, unspecified: Secondary | ICD-10-CM | POA: Diagnosis not present

## 2019-04-12 DIAGNOSIS — I739 Peripheral vascular disease, unspecified: Secondary | ICD-10-CM | POA: Diagnosis not present

## 2019-04-12 DIAGNOSIS — F331 Major depressive disorder, recurrent, moderate: Secondary | ICD-10-CM | POA: Diagnosis not present

## 2019-04-12 DIAGNOSIS — G4700 Insomnia, unspecified: Secondary | ICD-10-CM | POA: Diagnosis not present

## 2019-04-12 DIAGNOSIS — R109 Unspecified abdominal pain: Secondary | ICD-10-CM | POA: Diagnosis not present

## 2019-04-21 DIAGNOSIS — Q6210 Congenital occlusion of ureter, unspecified: Secondary | ICD-10-CM | POA: Diagnosis not present

## 2019-04-21 DIAGNOSIS — K5909 Other constipation: Secondary | ICD-10-CM | POA: Diagnosis not present

## 2019-04-21 DIAGNOSIS — N200 Calculus of kidney: Secondary | ICD-10-CM | POA: Diagnosis not present

## 2019-04-30 ENCOUNTER — Other Ambulatory Visit: Payer: Self-pay | Admitting: *Deleted

## 2019-04-30 DIAGNOSIS — I6523 Occlusion and stenosis of bilateral carotid arteries: Secondary | ICD-10-CM

## 2019-04-30 DIAGNOSIS — I739 Peripheral vascular disease, unspecified: Secondary | ICD-10-CM

## 2019-05-03 DIAGNOSIS — J302 Other seasonal allergic rhinitis: Secondary | ICD-10-CM | POA: Diagnosis not present

## 2019-05-04 ENCOUNTER — Ambulatory Visit (INDEPENDENT_AMBULATORY_CARE_PROVIDER_SITE_OTHER)
Admission: RE | Admit: 2019-05-04 | Discharge: 2019-05-04 | Disposition: A | Discharge status: Home / Self Care | Payer: Medicare HMO | Source: Ambulatory Visit | Attending: Vascular Surgery | Admitting: Vascular Surgery

## 2019-05-04 ENCOUNTER — Other Ambulatory Visit: Payer: Self-pay

## 2019-05-04 ENCOUNTER — Ambulatory Visit (HOSPITAL_COMMUNITY)
Admission: RE | Admit: 2019-05-04 | Discharge: 2019-05-04 | Disposition: A | Discharge status: Home / Self Care | Payer: Medicare HMO | Source: Ambulatory Visit | Attending: Vascular Surgery | Admitting: Vascular Surgery

## 2019-05-04 ENCOUNTER — Encounter: Payer: Self-pay | Admitting: Vascular Surgery

## 2019-05-04 ENCOUNTER — Ambulatory Visit (INDEPENDENT_AMBULATORY_CARE_PROVIDER_SITE_OTHER): Payer: Medicare HMO | Admitting: Vascular Surgery

## 2019-05-04 VITALS — BP 156/82 | HR 99 | Temp 97.6°F | Resp 16 | Ht <= 58 in | Wt 145.0 lb

## 2019-05-04 DIAGNOSIS — I6523 Occlusion and stenosis of bilateral carotid arteries: Secondary | ICD-10-CM

## 2019-05-04 DIAGNOSIS — I739 Peripheral vascular disease, unspecified: Secondary | ICD-10-CM

## 2019-05-04 MED ORDER — CILOSTAZOL 100 MG PO TABS: 100.0000 mg | ORAL_TABLET | Freq: Two times a day (BID) | ORAL | 11 refills | Status: DC

## 2019-05-04 NOTE — Progress Notes (Signed)
Patient name: Joanne Morris MRN: [id number] DOB: Dec 25, 1950 Sex: female  REASON FOR VISIT: Evaluate for PAD  HPI: Joanne Morris is a 69 y.o. female with history of carotid stenosis status post bilateral carotid endarterectomies by Dr. [name], hypertension, hyperlipidemia, coronary artery disease status post remote MI, diabetes, PAD that presents for evaluation of PAD.  She has previously been under the care of Dr. [name] but apparently due to transportation issues she can only come on a Tuesday.  In regards to her neck she feels that she has had no recent neurologic events aside from her stroke years ago and states she has no deficits from that.  No recent vision loss or weakness in arm or leg.  She is concerned that she  got keloids at both carotid neck incisions and she has previously been seen by Dr. [name] for this. As it relates to her legs she describes long history of claudication and cannot provide a definitive timeline.  She denies any rest pain at night and does not have any tissue loss at this time.  She was previously under care of Dr. [name].  She has previously undergone a right peroneal atherectomy 2015.  She has single-vessel runoff in the peroneal artery on the right per his note as well as single vessel peroneal runoff on the left.  She feels both legs have the same level of discomfort.  States she can walk one mile before stopping.  Past Medical History:  Diagnosis Date  . Angina   . Anxiety   . Arthritis    "knees" (07/20/2013)  . Carotid stenosis   . Carotid stenosis   . Claudication ([place])   . Coronary artery disease   . Depression   . GERD (gastroesophageal reflux disease)   . High cholesterol   . Hypertension   . Myocardial infarction ([place]) 1990's   "1"  . PAD (peripheral artery disease) ([place])   . Stroke [facility])    "mini stroke 1st then regular stroke", denies residual on 07/20/2013  . Type II diabetes mellitus ([place])   . Wears glasses     Past Surgical History:   Procedure Laterality Date  . ABDOMINAL HYSTERECTOMY    . ATHERECTOMY  10/05/2013  . BALLOON ANGIOPLASTY, ARTERY  10/05/2013   DR [name]  . CAROTID ENDARTERECTOMY    . CORONARY ANGIOPLASTY WITH STENT PLACEMENT     "1"  . ENDARTERECTOMY Left 02/07/2014   Procedure: ENDARTERECTOMY CAROTID;  Surgeon: Elam Dutch, MD;  Location: [place];  Service: Vascular;  Laterality: Left;  . ENDARTERECTOMY Right 04/18/2014   Procedure: ENDARTERECTOMY RIGHT CAROTID;  Surgeon: Elam Dutch, MD;  Location: [place];  Service: Vascular;  Laterality: Right;  . ENDARTERECTOMY N/A 04/24/2014   Procedure: IRRIGATION AND DEBRIDEMENT OF RIGHT NECK ;  Surgeon: Elam Dutch, MD;  Location: [place];  Service: Vascular;  Laterality: N/A;  . ESOPHAGOGASTRODUODENOSCOPY N/A 10/13/2012   Procedure: ESOPHAGOGASTRODUODENOSCOPY (EGD);  Surgeon: Gatha Mayer, MD;  Location: [facility] ENDOSCOPY;  Service: Endoscopy;  Laterality: N/A;  . KENALOG INJECTION Bilateral 08/22/2016   Procedure: KENALOG INJECTION BILATERAL NECK;  Surgeon: Wallace Going, DO;  Location: [place];  Service: Plastics;  Laterality: Bilateral;  . LOWER EXTREMITY ANGIOGRAM  07/20/2013   Unsuccessful attempt at crossing the CTO/notes 07/20/2013  . LOWER EXTREMITY ANGIOGRAM N/A 07/06/2013   Procedure: LOWER EXTREMITY ANGIOGRAM;  Surgeon: Laverda Page, MD;  Location: [facility] CATH LAB;  Service: Cardiovascular;  Laterality: N/A;  . LOWER EXTREMITY  ANGIOGRAM N/A 07/20/2013   Procedure: LOWER EXTREMITY ANGIOGRAM;  Surgeon: Laverda Page, MD;  Location: [facility] CATH LAB;  Service: Cardiovascular;  Laterality: N/A;  . LOWER EXTREMITY ANGIOGRAM N/A 10/05/2013   Procedure: LOWER EXTREMITY ANGIOGRAM;  Surgeon: Laverda Page, MD;  Location: [facility] CATH LAB;  Service: Cardiovascular;  Laterality: N/A;  . MASS EXCISION Right 08/22/2016   Procedure: EXCISION RIGHT NECK KELOID;  Surgeon: Wallace Going, DO;  Location: [place];  Service: Plastics;   Laterality: Right;  . PATCH ANGIOPLASTY Left 02/07/2014   Procedure: PATCH ANGIOPLASTY Carotid;  Surgeon: Elam Dutch, MD;  Location: [place];  Service: Vascular;  Laterality: Left;  . PATCH ANGIOPLASTY Right 04/18/2014   Procedure: PATCH ANGIOPLASTY USING HEMASHIELD 0.8cmx 7.6cm PATCH;  Surgeon: Elam Dutch, MD;  Location: [place];  Service: Vascular;  Laterality: Right;  . PERIPHERAL VASCULAR CATHETERIZATION N/A 07/05/2014   Procedure: Lower Extremity Angiography;  Surgeon: [name], MD;  Location: [place] CV LAB;  Service: Cardiovascular;  Laterality: N/A;  . TUBAL LIGATION      History reviewed. No pertinent family history.  SOCIAL HISTORY: Social History   Tobacco Use  . Smoking status: Former Smoker    Packs/day: 0.50    Years: 4.00    Pack years: 2.00    Types: Cigarettes    Quit date: 05/30/2009    Years since quitting: 9.9  . Smokeless tobacco: Never Used  Substance Use Topics  . Alcohol use: No    Alcohol/week: 0.0 standard drinks    Allergies  Allergen Reactions  . No Known Allergies     Current Outpatient Medications  Medication Sig Dispense Refill  . ALPRAZolam (XANAX) 0.25 MG tablet Take 1 tablet (0.25 mg total) by mouth daily as needed for anxiety. 3 tablet 0  . amLODipine (NORVASC) 10 MG tablet Take 10 mg by mouth daily.  5  . aspirin EC 81 MG tablet Take 81 mg by mouth daily.    . Cholecalciferol (VITAMIN D3) 1.25 MG (50000 UT) CAPS Take 1 capsule by mouth every Sunday.     . cyclobenzaprine (FLEXERIL) 10 MG tablet Take 5 mg by mouth 3 (three) times daily as needed for muscle spasms.    . ferrous sulfate 325 (65 FE) MG EC tablet Take 325 mg by mouth every Sunday.     Marland Kitchen HYDROcodone-acetaminophen (NORCO) 5-325 MG tablet Take 1-2 tablets by mouth every 6 (six) hours as needed for moderate pain or severe pain. 12 tablet 0  . metoprolol (LOPRESSOR) 100 MG tablet Take 100 mg by mouth 2 (two) times daily.    . niacin (NIASPAN) 1000 MG CR tablet Take 1,000  mg by mouth daily.   6  . nitroGLYCERIN (NITROSTAT) 0.4 MG SL tablet Place 0.4 mg under the tongue every 5 (five) minutes as needed for chest pain.     Marland Kitchen omeprazole (PRILOSEC) 20 MG capsule Take 20 mg by mouth daily.    . polyethylene glycol powder ([employer] PURELAX) 17 GM/SCOOP powder Take 17 g by mouth daily as needed for constipation.    . pravastatin (PRAVACHOL) 40 MG tablet Take 40 mg by mouth daily.    . ranitidine (ZANTAC) 150 MG tablet Take 150 mg by mouth 2 (two) times daily as needed for heartburn.    . sertraline (ZOLOFT) 50 MG tablet Take 50 mg by mouth every evening.    Marland Kitchen tiZANidine (ZANAFLEX) 4 MG tablet Take 4 mg by mouth at bedtime as needed for  muscle spasms.   2  . valsartan-hydrochlorothiazide (DIOVAN-HCT) 320-25 MG per tablet Take 1 tablet by mouth daily.   2  . zolpidem (AMBIEN) 10 MG tablet Take 0.5 tablets (5 mg total) by mouth at bedtime.     No current facility-administered medications for this visit.    REVIEW OF SYSTEMS:  [X]  denotes positive finding, [ ]  denotes negative finding Cardiac  Comments:  Chest pain or chest pressure:    Shortness of breath upon exertion:    Short of breath when lying flat:    Irregular heart rhythm:        Vascular    Pain in calf, thigh, or hip brought on by ambulation: x Both legs  Pain in feet at night that wakes you up from your sleep:     Blood clot in your veins:    Leg swelling:         Pulmonary    Oxygen at home:    Productive cough:     Wheezing:         Neurologic    Sudden weakness in arms or legs:     Sudden numbness in arms or legs:     Sudden onset of difficulty speaking or slurred speech:    Temporary loss of vision in one eye:     Problems with dizziness:         Gastrointestinal    Blood in stool:     Vomited blood:         Genitourinary    Burning when urinating:     Blood in urine:        Psychiatric    Major depression:         Hematologic    Bleeding problems:    Problems with blood clotting  too easily:        Skin    Rashes or ulcers:        Constitutional    Fever or chills:      PHYSICAL EXAM: Vitals:   05/04/19 1051  BP: (!) 156/82  Pulse: 99  Resp: 16  Temp: 97.6 F (36.4 C)  TempSrc: Temporal  SpO2: 100%  Weight: 145 lb (65.8 kg)  Height: 4\' 1"  (1.245 m)    GENERAL: The patient is a well-nourished female, in no acute distress. The vital signs are documented above. CARDIAC: There is a regular rate and rhythm.  VASCULAR:  Palpable femoral pulses bilaterally No palpable pedal pulses No lower extremity tissue loss PULMONARY: There is good air exchange bilaterally without wheezing or rales. ABDOMEN: Soft and non-tender with normal pitched bowel sounds.  MUSCULOSKELETAL: There are no major deformities or cyanosis. NEUROLOGIC: No focal weakness or paresthesias are detected. SKIN: There are no ulcers or rashes noted. PSYCHIATRIC: The patient has a normal affect.  DATA:   ABI 0.9 on the right biphasic and 0.69 on the left monophasic  Carotid duplex shows widely patent carotid endarterectomy sites bilaterally  Assessment/Plan:  69 year old female that has been under the care of Dr. [name] and has undergone bilateral carotid endarterectomies.  She presents today for evaluation of PAD given transportation issues and can only be seen on a Tuesday.  As it relates to her lower extremities she describes claudication and states she can walk up to 1 mile before having to stop.  In reviewing old notes from previous angiograms with Dr. [name] appears she has single-vessel peroneal runoff bilaterally.  I discussed with the patient that in the setting of nonlifestyle  limiting claudication and single-vessel peroneal runoff I would not recommend any surgical intervention at this time unless she develops rest pain or tissue loss or significant progression of her symptoms.  I did prescribe her Pletal 100 mg twice daily to see if that will help any of her symptoms and walking  distances.  She denies any underlying history of heart failure that would obviously be a contraindication for this medication.  Discussed we will see her back in 6 months with ABIs.  Her carotid duplex today for ongoing surveillance after surgery with Dr. Eden Lathe shows widely patent carotid endarterectomy sites bilaterally.  I will refer her back to Dr. [name] for keloids at bilateral neck incisions and she has been seen by Dr. [name] in the past.   Marty Heck, MD [facility]: [phone]

## 2019-05-05 ENCOUNTER — Other Ambulatory Visit: Payer: Self-pay

## 2019-05-05 MED ORDER — CILOSTAZOL 100 MG PO TABS: 100.0000 mg | ORAL_TABLET | Freq: Two times a day (BID) | ORAL | 11 refills | Status: DC

## 2019-05-06 ENCOUNTER — Other Ambulatory Visit: Payer: Self-pay | Admitting: *Deleted

## 2019-05-06 ENCOUNTER — Other Ambulatory Visit: Payer: Self-pay | Admitting: Vascular Surgery

## 2019-05-06 DIAGNOSIS — I739 Peripheral vascular disease, unspecified: Secondary | ICD-10-CM

## 2019-05-06 MED ORDER — CILOSTAZOL 100 MG PO TABS: 100.0000 mg | ORAL_TABLET | Freq: Two times a day (BID) | ORAL | 11 refills | Status: DC

## 2019-05-26 DIAGNOSIS — N959 Unspecified menopausal and perimenopausal disorder: Secondary | ICD-10-CM | POA: Diagnosis not present

## 2019-05-26 DIAGNOSIS — Z1231 Encounter for screening mammogram for malignant neoplasm of breast: Secondary | ICD-10-CM | POA: Diagnosis not present

## 2019-05-26 DIAGNOSIS — M549 Dorsalgia, unspecified: Secondary | ICD-10-CM | POA: Diagnosis not present

## 2019-05-26 DIAGNOSIS — M85851 Other specified disorders of bone density and structure, right thigh: Secondary | ICD-10-CM | POA: Diagnosis not present

## 2019-05-26 DIAGNOSIS — M810 Age-related osteoporosis without current pathological fracture: Secondary | ICD-10-CM | POA: Diagnosis not present

## 2019-05-26 DIAGNOSIS — N200 Calculus of kidney: Secondary | ICD-10-CM | POA: Diagnosis not present

## 2019-06-01 ENCOUNTER — Institutional Professional Consult (permissible substitution): Payer: Medicaid Other | Admitting: Plastic Surgery

## 2019-06-07 DIAGNOSIS — H52209 Unspecified astigmatism, unspecified eye: Secondary | ICD-10-CM | POA: Diagnosis not present

## 2019-06-07 DIAGNOSIS — H5203 Hypermetropia, bilateral: Secondary | ICD-10-CM | POA: Diagnosis not present

## 2019-06-07 DIAGNOSIS — H524 Presbyopia: Secondary | ICD-10-CM | POA: Diagnosis not present

## 2019-07-20 ENCOUNTER — Institutional Professional Consult (permissible substitution): Payer: Medicaid Other | Admitting: Plastic Surgery

## 2019-07-22 DIAGNOSIS — G4700 Insomnia, unspecified: Secondary | ICD-10-CM | POA: Diagnosis not present

## 2019-07-22 DIAGNOSIS — E559 Vitamin D deficiency, unspecified: Secondary | ICD-10-CM | POA: Diagnosis not present

## 2019-07-22 DIAGNOSIS — I10 Essential (primary) hypertension: Secondary | ICD-10-CM | POA: Diagnosis not present

## 2019-07-22 DIAGNOSIS — I739 Peripheral vascular disease, unspecified: Secondary | ICD-10-CM | POA: Diagnosis not present

## 2019-07-22 DIAGNOSIS — E782 Mixed hyperlipidemia: Secondary | ICD-10-CM | POA: Diagnosis not present

## 2019-07-22 DIAGNOSIS — R109 Unspecified abdominal pain: Secondary | ICD-10-CM | POA: Diagnosis not present

## 2019-07-22 DIAGNOSIS — E1169 Type 2 diabetes mellitus with other specified complication: Secondary | ICD-10-CM | POA: Diagnosis not present

## 2019-07-22 DIAGNOSIS — F331 Major depressive disorder, recurrent, moderate: Secondary | ICD-10-CM | POA: Diagnosis not present

## 2019-07-22 DIAGNOSIS — N183 Chronic kidney disease, stage 3 unspecified: Secondary | ICD-10-CM | POA: Diagnosis not present

## 2019-08-06 DIAGNOSIS — R109 Unspecified abdominal pain: Secondary | ICD-10-CM | POA: Diagnosis not present

## 2019-08-06 DIAGNOSIS — R0989 Other specified symptoms and signs involving the circulatory and respiratory systems: Secondary | ICD-10-CM | POA: Diagnosis not present

## 2019-08-06 DIAGNOSIS — N200 Calculus of kidney: Secondary | ICD-10-CM | POA: Diagnosis not present

## 2019-09-14 ENCOUNTER — Encounter: Payer: Self-pay | Admitting: Plastic Surgery

## 2019-09-14 ENCOUNTER — Ambulatory Visit (INDEPENDENT_AMBULATORY_CARE_PROVIDER_SITE_OTHER): Payer: Medicare HMO | Admitting: Plastic Surgery

## 2019-09-14 ENCOUNTER — Other Ambulatory Visit: Payer: Self-pay

## 2019-09-14 DIAGNOSIS — L910 Hypertrophic scar: Secondary | ICD-10-CM

## 2019-09-14 NOTE — Progress Notes (Signed)
Patient ID: Joanne Morris, female    DOB: 27-Aug-1950, 69 y.o.   MRN: [id number]   Chief Complaint  Patient presents with  . Advice Only  . Skin Problem    The patient is a 69 year old black female here for evaluation of her neck scars.  The patient underwent bilateral carotid endarterectomies by Dr. Eden Lathe in 2016.  She developed keloids and was referred to me in 2018.  She underwent excision of the right keloid at that time.  Her other medical conditions appear to be stable.  She was recently seen by Dr. Scot Dock and no further surgery is planned.  She is 4 feet 1 inches tall and weighs 147 pounds.  She has diabetes, kidney disease, coronary artery disease, peripheral vascular disease hyperlipidemia and depression.  The right keloid has returned.  It is quite large.  The patient complains of discomfort in the area.  It appears that she did not follow-up after her previous surgery and therefore did not have the steroid injections.    Review of Systems  Constitutional: Negative.   HENT: Negative.   Eyes: Negative.   Respiratory: Negative.   Cardiovascular: Negative.   Gastrointestinal: Negative.   Endocrine: Negative.   Genitourinary: Negative.   Musculoskeletal: Negative.   Hematological: Negative.   Psychiatric/Behavioral: Negative.     Past Medical History:  Diagnosis Date  . Angina   . Anxiety   . Arthritis    "knees" (07/20/2013)  . Carotid stenosis   . Carotid stenosis   . Claudication ([place])   . Coronary artery disease   . Depression   . GERD (gastroesophageal reflux disease)   . High cholesterol   . Hypertension   . Myocardial infarction ([place]) 1990's   "1"  . PAD (peripheral artery disease) ([place])   . Stroke [facility])    "mini stroke 1st then regular stroke", denies residual on 07/20/2013  . Type II diabetes mellitus ([place])   . Wears glasses     Past Surgical History:  Procedure Laterality Date  . ABDOMINAL HYSTERECTOMY    . ATHERECTOMY  10/05/2013  . BALLOON  ANGIOPLASTY, ARTERY  10/05/2013   DR [name]  . CAROTID ENDARTERECTOMY    . CORONARY ANGIOPLASTY WITH STENT PLACEMENT     "1"  . ENDARTERECTOMY Left 02/07/2014   Procedure: ENDARTERECTOMY CAROTID;  Surgeon: Elam Dutch, MD;  Location: [place];  Service: Vascular;  Laterality: Left;  . ENDARTERECTOMY Right 04/18/2014   Procedure: ENDARTERECTOMY RIGHT CAROTID;  Surgeon: Elam Dutch, MD;  Location: [place];  Service: Vascular;  Laterality: Right;  . ENDARTERECTOMY N/A 04/24/2014   Procedure: IRRIGATION AND DEBRIDEMENT OF RIGHT NECK ;  Surgeon: Elam Dutch, MD;  Location: [place];  Service: Vascular;  Laterality: N/A;  . ESOPHAGOGASTRODUODENOSCOPY N/A 10/13/2012   Procedure: ESOPHAGOGASTRODUODENOSCOPY (EGD);  Surgeon: Gatha Mayer, MD;  Location: [place] ENDOSCOPY;  Service: Endoscopy;  Laterality: N/A;  . KENALOG INJECTION Bilateral 08/22/2016   Procedure: KENALOG INJECTION BILATERAL NECK;  Surgeon: Wallace Going, DO;  Location: [place];  Service: Plastics;  Laterality: Bilateral;  . LOWER EXTREMITY ANGIOGRAM  07/20/2013   Unsuccessful attempt at crossing the CTO/notes 07/20/2013  . LOWER EXTREMITY ANGIOGRAM N/A 07/06/2013   Procedure: LOWER EXTREMITY ANGIOGRAM;  Surgeon: Laverda Page, MD;  Location: [facility] CATH LAB;  Service: Cardiovascular;  Laterality: N/A;  . LOWER EXTREMITY ANGIOGRAM N/A 07/20/2013   Procedure: LOWER EXTREMITY ANGIOGRAM;  Surgeon: Laverda Page, MD;  Location: [facility] CATH  LAB;  Service: Cardiovascular;  Laterality: N/A;  . LOWER EXTREMITY ANGIOGRAM N/A 10/05/2013   Procedure: LOWER EXTREMITY ANGIOGRAM;  Surgeon: Laverda Page, MD;  Location: [facility] CATH LAB;  Service: Cardiovascular;  Laterality: N/A;  . MASS EXCISION Right 08/22/2016   Procedure: EXCISION RIGHT NECK KELOID;  Surgeon: Wallace Going, DO;  Location: [place];  Service: Plastics;  Laterality: Right;  . PATCH ANGIOPLASTY Left 02/07/2014   Procedure: PATCH ANGIOPLASTY  Carotid;  Surgeon: Elam Dutch, MD;  Location: [place];  Service: Vascular;  Laterality: Left;  . PATCH ANGIOPLASTY Right 04/18/2014   Procedure: PATCH ANGIOPLASTY USING HEMASHIELD 0.8cmx 7.6cm PATCH;  Surgeon: Elam Dutch, MD;  Location: [place];  Service: Vascular;  Laterality: Right;  . PERIPHERAL VASCULAR CATHETERIZATION N/A 07/05/2014   Procedure: Lower Extremity Angiography;  Surgeon: [name], MD;  Location: [place] CV LAB;  Service: Cardiovascular;  Laterality: N/A;  . TUBAL LIGATION        Current Outpatient Medications:  .  ALPRAZolam (XANAX) 0.25 MG tablet, Take 1 tablet (0.25 mg total) by mouth daily as needed for anxiety., Disp: 3 tablet, Rfl: 0 .  amLODipine (NORVASC) 10 MG tablet, Take 10 mg by mouth daily., Disp: , Rfl: 5 .  aspirin EC 81 MG tablet, Take 81 mg by mouth daily., Disp: , Rfl:  .  Cholecalciferol (VITAMIN D3) 1.25 MG (50000 UT) CAPS, Take 1 capsule by mouth every Sunday. , Disp: , Rfl:  .  cilostazol (PLETAL) 100 MG tablet, Take 1 tablet (100 mg total) by mouth 2 (two) times daily before a meal., Disp: 60 tablet, Rfl: 11 .  cilostazol (PLETAL) 100 MG tablet, Take 1 tablet (100 mg total) by mouth 2 (two) times daily before a meal., Disp: 60 tablet, Rfl: 11 .  cyclobenzaprine (FLEXERIL) 10 MG tablet, Take 5 mg by mouth 3 (three) times daily as needed for muscle spasms., Disp: , Rfl:  .  ferrous sulfate 325 (65 FE) MG EC tablet, Take 325 mg by mouth every Sunday. , Disp: , Rfl:  .  HYDROcodone-acetaminophen (NORCO) 5-325 MG tablet, Take 1-2 tablets by mouth every 6 (six) hours as needed for moderate pain or severe pain., Disp: 12 tablet, Rfl: 0 .  metoprolol (LOPRESSOR) 100 MG tablet, Take 100 mg by mouth 2 (two) times daily., Disp: , Rfl:  .  niacin (NIASPAN) 1000 MG CR tablet, Take 1,000 mg by mouth daily. , Disp: , Rfl: 6 .  nitroGLYCERIN (NITROSTAT) 0.4 MG SL tablet, Place 0.4 mg under the tongue every 5 (five) minutes as needed for chest pain. , Disp: ,  Rfl:  .  omeprazole (PRILOSEC) 20 MG capsule, Take 20 mg by mouth daily., Disp: , Rfl:  .  polyethylene glycol powder ([employer] PURELAX) 17 GM/SCOOP powder, Take 17 g by mouth daily as needed for constipation., Disp: , Rfl:  .  pravastatin (PRAVACHOL) 40 MG tablet, Take 40 mg by mouth daily., Disp: , Rfl:  .  ranitidine (ZANTAC) 150 MG tablet, Take 150 mg by mouth 2 (two) times daily as needed for heartburn., Disp: , Rfl:  .  sertraline (ZOLOFT) 50 MG tablet, Take 50 mg by mouth every evening., Disp: , Rfl:  .  tiZANidine (ZANAFLEX) 4 MG tablet, Take 4 mg by mouth at bedtime as needed for muscle spasms. , Disp: , Rfl: 2 .  valsartan-hydrochlorothiazide (DIOVAN-HCT) 320-25 MG per tablet, Take 1 tablet by mouth daily. , Disp: , Rfl: 2 .  zolpidem (AMBIEN)  10 MG tablet, Take 0.5 tablets (5 mg total) by mouth at bedtime., Disp: , Rfl:    Objective:   Vitals:   09/14/19 0957  BP: (!) 141/56  Pulse: 75  Temp: 98.5 F (36.9 C)  SpO2: 100%    Physical Exam Vitals and nursing note reviewed.  Constitutional:      Appearance: Normal appearance.  HENT:     Head:   Cardiovascular:     Rate and Rhythm: Normal rate.     Pulses: Normal pulses.  Pulmonary:     Effort: Pulmonary effort is normal.  Abdominal:     General: Abdomen is flat.  Musculoskeletal:        General: Deformity present.  Skin:    General: Skin is warm.     Capillary Refill: Capillary refill takes less than 2 seconds.  Neurological:     General: No focal deficit present.     Mental Status: She is alert. Mental status is at baseline.  Psychiatric:        Mood and Affect: Mood normal.        Behavior: Behavior normal.     Assessment & Plan:  Keloid scar  There is a very good chance that this will occur again even with surgery.  However the patient does not appear to have had Kenalog injections after the excision due to loss of follow-up.  It may be worth trying again to excise and then do the steroid injections.  I will  have to be sure she understands the importance of this after the excision.  I do not want to do the left side until I know if this is gone to work on the right side.  Especially since this is the second time.  Pictures were obtained of the patient and placed in the chart with the patient's or guardian's permission.   Laura, DO

## 2019-10-26 ENCOUNTER — Encounter: Payer: Medicaid Other | Admitting: Plastic Surgery

## 2019-11-10 DIAGNOSIS — R52 Pain, unspecified: Secondary | ICD-10-CM | POA: Diagnosis not present

## 2019-11-10 DIAGNOSIS — R109 Unspecified abdominal pain: Secondary | ICD-10-CM | POA: Diagnosis not present

## 2019-11-10 DIAGNOSIS — R000 Tachycardia, unspecified: Secondary | ICD-10-CM | POA: Diagnosis not present

## 2019-11-10 DIAGNOSIS — R1084 Generalized abdominal pain: Secondary | ICD-10-CM | POA: Diagnosis not present

## 2019-11-10 DIAGNOSIS — M160 Bilateral primary osteoarthritis of hip: Secondary | ICD-10-CM | POA: Diagnosis not present

## 2019-11-10 DIAGNOSIS — I517 Cardiomegaly: Secondary | ICD-10-CM | POA: Diagnosis not present

## 2019-11-10 DIAGNOSIS — I700 Atherosclerosis of aorta: Secondary | ICD-10-CM | POA: Diagnosis not present

## 2019-11-10 DIAGNOSIS — M5489 Other dorsalgia: Secondary | ICD-10-CM | POA: Diagnosis not present

## 2019-11-12 ENCOUNTER — Other Ambulatory Visit (HOSPITAL_COMMUNITY): Payer: Medicaid Other

## 2019-11-15 ENCOUNTER — Ambulatory Visit: Admit: 2019-11-15 | Payer: Medicaid Other | Admitting: Plastic Surgery

## 2019-11-15 SURGERY — EXCISION, MASS, NECK
Anesthesia: General | Site: Neck | Laterality: Right

## 2019-11-18 DIAGNOSIS — F331 Major depressive disorder, recurrent, moderate: Secondary | ICD-10-CM | POA: Diagnosis not present

## 2019-11-18 DIAGNOSIS — I10 Essential (primary) hypertension: Secondary | ICD-10-CM | POA: Diagnosis not present

## 2019-11-18 DIAGNOSIS — G4700 Insomnia, unspecified: Secondary | ICD-10-CM | POA: Diagnosis not present

## 2019-11-18 DIAGNOSIS — R109 Unspecified abdominal pain: Secondary | ICD-10-CM | POA: Diagnosis not present

## 2019-11-18 DIAGNOSIS — Z23 Encounter for immunization: Secondary | ICD-10-CM | POA: Diagnosis not present

## 2019-11-18 DIAGNOSIS — D509 Iron deficiency anemia, unspecified: Secondary | ICD-10-CM | POA: Diagnosis not present

## 2019-11-18 DIAGNOSIS — N183 Chronic kidney disease, stage 3 unspecified: Secondary | ICD-10-CM | POA: Diagnosis not present

## 2019-11-18 DIAGNOSIS — I739 Peripheral vascular disease, unspecified: Secondary | ICD-10-CM | POA: Diagnosis not present

## 2019-11-18 DIAGNOSIS — E1169 Type 2 diabetes mellitus with other specified complication: Secondary | ICD-10-CM | POA: Diagnosis not present

## 2019-11-18 DIAGNOSIS — E782 Mixed hyperlipidemia: Secondary | ICD-10-CM | POA: Diagnosis not present

## 2019-11-18 DIAGNOSIS — E559 Vitamin D deficiency, unspecified: Secondary | ICD-10-CM | POA: Diagnosis not present

## 2019-11-23 ENCOUNTER — Encounter: Payer: Medicaid Other | Admitting: Surgical

## 2019-11-30 ENCOUNTER — Encounter: Payer: Medicaid Other | Admitting: Plastic Surgery

## 2019-12-14 ENCOUNTER — Other Ambulatory Visit: Payer: Self-pay

## 2019-12-14 ENCOUNTER — Encounter: Payer: Self-pay | Admitting: Surgical

## 2019-12-14 ENCOUNTER — Ambulatory Visit: Payer: Medicaid Other | Admitting: Plastic Surgery

## 2019-12-14 ENCOUNTER — Ambulatory Visit (INDEPENDENT_AMBULATORY_CARE_PROVIDER_SITE_OTHER): Payer: Medicare HMO | Admitting: Surgical

## 2019-12-14 VITALS — BP 180/73 | HR 95 | Temp 98.5°F

## 2019-12-14 DIAGNOSIS — L910 Hypertrophic scar: Secondary | ICD-10-CM | POA: Diagnosis not present

## 2019-12-14 NOTE — Progress Notes (Signed)
   Subjective:     Patient ID: Joanne Morris, female    DOB: 17-Feb-1950, 69 y.o.   MRN: [id number]  Chief Complaint  Patient presents with  . Follow-up    HPI: The patient is a 69 y.o. female here to discuss excision of her keloids that she developed after her bilateral carotid endarterectomies in 2016 with Dr. Eden Lathe.  Patient underwent excision of right neck keloid with bilateral Kenalog injections into the neck keloid on 08/22/2016 with Dr. [name].  It appears patient was then lost in follow-up and returned to see Dr. [name] on 09/14/2019 for further evaluation and discussion of excision.  She was scheduled for an additional excision of the right neck keloid on 11/15/2019.  However this was canceled and it is unclear the circumstances as to why.  Patient is unable to provide accurate history.  She does endorse that they continue to be tender and bothersome to her.  She is interested in undergoing an excision.  She is here with her daughter.  Patient has a past medical history of diabetes, chronic kidney disease, coronary artery disease, peripheral vascular disease, hyperlipidemia  Review of Systems  Gastrointestinal: Negative.   Musculoskeletal: Positive for neck pain (Tenderness of keloid).  Skin: Negative for color change and wound.     Objective:   Vital Signs BP (!) 180/73 (BP Location: Left Arm, Patient Position: Sitting, Cuff Size: Normal)   Pulse 95   Temp 98.5 F (36.9 C) (Oral)   SpO2 100%  Vital Signs and Nursing Note Reviewed Chaperone present Physical Exam Constitutional:      General: She is not in acute distress.    Appearance: She is not ill-appearing.  Neck:   Neurological:     General: No focal deficit present.     Mental Status: She is alert.  Psychiatric:        Mood and Affect: Mood normal.        Behavior: Behavior normal.     Assessment/Plan:     ICD-10-CM   1. Keloid scar  L91.0     It is unclear why patient's surgery in October was  canceled. She is interested in continuing with plans for excision of the right neck keloid as it is most bothersome to her.  She reports daily tenderness.  She also is bothered by their appearance.  I discussed with the patient that I would discuss this further with Dr. [name] and I with surgical scheduling team to have this arranged.  I recommend they call with any questions or concerns, we will reach out to her to schedule an additional follow-up or to schedule excision.  Previously noted that Dr. [name] would like to excise the right side prior to any intervention on the left side due to the recurrence on the right after previous excision.  Of note follow-up for patient was lost after initial surgery and she does not receive ongoing steroid injections.  Pictures were obtained of the patient and placed in the chart with the patient's or guardian's permission.    Carola Rhine Katianna Mcclenney, PA-C 12/14/2019, 10:30 AM

## 2019-12-22 DIAGNOSIS — G5691 Unspecified mononeuropathy of right upper limb: Secondary | ICD-10-CM | POA: Diagnosis not present

## 2020-01-14 DIAGNOSIS — Z0000 Encounter for general adult medical examination without abnormal findings: Secondary | ICD-10-CM | POA: Diagnosis not present

## 2020-01-14 DIAGNOSIS — E785 Hyperlipidemia, unspecified: Secondary | ICD-10-CM | POA: Diagnosis not present

## 2020-01-18 DIAGNOSIS — R109 Unspecified abdominal pain: Secondary | ICD-10-CM | POA: Diagnosis not present

## 2020-01-18 DIAGNOSIS — Z6827 Body mass index (BMI) 27.0-27.9, adult: Secondary | ICD-10-CM | POA: Diagnosis not present

## 2020-01-18 DIAGNOSIS — G5691 Unspecified mononeuropathy of right upper limb: Secondary | ICD-10-CM | POA: Diagnosis not present

## 2020-02-22 ENCOUNTER — Encounter: Payer: Medicaid Other | Admitting: Plastic Surgery

## 2020-03-17 ENCOUNTER — Other Ambulatory Visit (HOSPITAL_COMMUNITY): Payer: Medicaid Other

## 2020-03-20 ENCOUNTER — Ambulatory Visit: Admit: 2020-03-20 | Payer: Medicare HMO | Admitting: Plastic Surgery

## 2020-03-20 SURGERY — EXCISION, MASS, NECK
Anesthesia: General | Site: Neck | Laterality: Right

## 2020-03-21 DIAGNOSIS — N183 Chronic kidney disease, stage 3 unspecified: Secondary | ICD-10-CM | POA: Diagnosis not present

## 2020-03-21 DIAGNOSIS — E559 Vitamin D deficiency, unspecified: Secondary | ICD-10-CM | POA: Diagnosis not present

## 2020-03-21 DIAGNOSIS — I739 Peripheral vascular disease, unspecified: Secondary | ICD-10-CM | POA: Diagnosis not present

## 2020-03-21 DIAGNOSIS — I10 Essential (primary) hypertension: Secondary | ICD-10-CM | POA: Diagnosis not present

## 2020-03-21 DIAGNOSIS — E782 Mixed hyperlipidemia: Secondary | ICD-10-CM | POA: Diagnosis not present

## 2020-03-21 DIAGNOSIS — G4700 Insomnia, unspecified: Secondary | ICD-10-CM | POA: Diagnosis not present

## 2020-03-21 DIAGNOSIS — E1169 Type 2 diabetes mellitus with other specified complication: Secondary | ICD-10-CM | POA: Diagnosis not present

## 2020-03-21 DIAGNOSIS — F331 Major depressive disorder, recurrent, moderate: Secondary | ICD-10-CM | POA: Diagnosis not present

## 2020-03-21 DIAGNOSIS — R109 Unspecified abdominal pain: Secondary | ICD-10-CM | POA: Diagnosis not present

## 2020-03-28 ENCOUNTER — Encounter: Payer: Medicaid Other | Admitting: Plastic Surgery

## 2020-04-18 ENCOUNTER — Encounter: Payer: Medicaid Other | Admitting: Plastic Surgery

## 2020-05-12 DIAGNOSIS — E785 Hyperlipidemia, unspecified: Secondary | ICD-10-CM | POA: Diagnosis not present

## 2020-05-12 DIAGNOSIS — I2510 Atherosclerotic heart disease of native coronary artery without angina pectoris: Secondary | ICD-10-CM | POA: Diagnosis not present

## 2020-05-12 DIAGNOSIS — I6523 Occlusion and stenosis of bilateral carotid arteries: Secondary | ICD-10-CM | POA: Diagnosis not present

## 2020-05-12 DIAGNOSIS — I739 Peripheral vascular disease, unspecified: Secondary | ICD-10-CM | POA: Diagnosis not present

## 2020-05-12 DIAGNOSIS — I252 Old myocardial infarction: Secondary | ICD-10-CM | POA: Diagnosis not present

## 2020-05-23 DIAGNOSIS — Z6828 Body mass index (BMI) 28.0-28.9, adult: Secondary | ICD-10-CM | POA: Diagnosis not present

## 2020-05-23 DIAGNOSIS — G4700 Insomnia, unspecified: Secondary | ICD-10-CM | POA: Diagnosis not present

## 2020-05-23 DIAGNOSIS — Z79899 Other long term (current) drug therapy: Secondary | ICD-10-CM | POA: Diagnosis not present

## 2020-05-23 DIAGNOSIS — R109 Unspecified abdominal pain: Secondary | ICD-10-CM | POA: Diagnosis not present

## 2020-06-27 ENCOUNTER — Ambulatory Visit (INDEPENDENT_AMBULATORY_CARE_PROVIDER_SITE_OTHER): Payer: Medicare HMO | Admitting: Plastic Surgery

## 2020-06-27 ENCOUNTER — Other Ambulatory Visit: Payer: Self-pay

## 2020-06-27 ENCOUNTER — Encounter: Payer: Self-pay | Admitting: Plastic Surgery

## 2020-06-27 DIAGNOSIS — L910 Hypertrophic scar: Secondary | ICD-10-CM | POA: Diagnosis not present

## 2020-06-27 NOTE — Progress Notes (Signed)
   Subjective:    Patient ID: Joanne Morris, female    DOB: 06-13-1950, 70 y.o.   MRN: [id number]  The patient is a 70 year old female here for evaluation of keloid scars on her neck.  She had carotid surgery in 2016 by Dr. [name].  She saw me in 2018 for the keloids.  We did excision of the right side.  And then followed with Kenalog injection.  Unfortunately the keloid came back.  It seems to be about like it was prior to excision.  The left side does not seem to be changing a whole lot.  She is otherwise doing well and not had any additional problems.  She complains about the keloid getting ulcerated and hurting.   Review of Systems  Constitutional: Negative.  Negative for activity change.  HENT: Negative.   Eyes: Negative.   Respiratory: Negative.  Negative for chest tightness.   Cardiovascular: Negative.   Gastrointestinal: Negative.   Endocrine: Negative.   Genitourinary: Negative.   Musculoskeletal: Negative.   Hematological: Negative.   Psychiatric/Behavioral: Negative.        Objective:   Physical Exam Vitals and nursing note reviewed.  Constitutional:      Appearance: Normal appearance.  HENT:     Head: Atraumatic.  Cardiovascular:     Rate and Rhythm: Normal rate.     Pulses: Normal pulses.  Pulmonary:     Effort: Pulmonary effort is normal.  Abdominal:     General: Abdomen is flat. There is no distension.  Musculoskeletal:        General: No swelling, tenderness or deformity.  Skin:    Coloration: Skin is not jaundiced or pale.     Findings: Lesion present. No bruising.  Neurological:     General: No focal deficit present.     Mental Status: She is alert. Mental status is at baseline.  Psychiatric:        Mood and Affect: Mood normal.        Behavior: Behavior normal.         Assessment & Plan:     ICD-10-CM   1. Keloid scar  L91.0     We had a long discussion about the course of keloids and the probability of it returning even after surgery.   Unfortunately I think there is a 99% chance she could get another keloid even with excision and Kenalog injection.  I recommend that if she decides to have it excised that we do Kenalog injections and silicone sheets with massage.  There is nothing else that we know of that we will help.  I will try to see if we can get acell approved.  This might decrease her risk of recurrence.  She would like to move ahead with excision of right neck keloid excision and placement of ACell.  Pictures were obtained of the patient and placed in the chart with the patient's or guardian's permission.

## 2020-08-01 DIAGNOSIS — E559 Vitamin D deficiency, unspecified: Secondary | ICD-10-CM | POA: Diagnosis not present

## 2020-08-01 DIAGNOSIS — I10 Essential (primary) hypertension: Secondary | ICD-10-CM | POA: Diagnosis not present

## 2020-08-01 DIAGNOSIS — I739 Peripheral vascular disease, unspecified: Secondary | ICD-10-CM | POA: Diagnosis not present

## 2020-08-01 DIAGNOSIS — R109 Unspecified abdominal pain: Secondary | ICD-10-CM | POA: Diagnosis not present

## 2020-08-01 DIAGNOSIS — E1169 Type 2 diabetes mellitus with other specified complication: Secondary | ICD-10-CM | POA: Diagnosis not present

## 2020-08-01 DIAGNOSIS — E782 Mixed hyperlipidemia: Secondary | ICD-10-CM | POA: Diagnosis not present

## 2020-08-01 DIAGNOSIS — F331 Major depressive disorder, recurrent, moderate: Secondary | ICD-10-CM | POA: Diagnosis not present

## 2020-08-01 DIAGNOSIS — N183 Chronic kidney disease, stage 3 unspecified: Secondary | ICD-10-CM | POA: Diagnosis not present

## 2020-08-01 DIAGNOSIS — G4700 Insomnia, unspecified: Secondary | ICD-10-CM | POA: Diagnosis not present

## 2020-08-05 IMAGING — CR DG LUMBAR SPINE COMPLETE 4+V
5 series · 5 of 5 positions shown · non-contrast
Comparison: Abdominal CT dated 07/21/2014

CLINICAL DATA: 67-year-old female with back pain.

EXAM:
LUMBAR SPINE - COMPLETE 4+ VIEW

[l-spine ap]
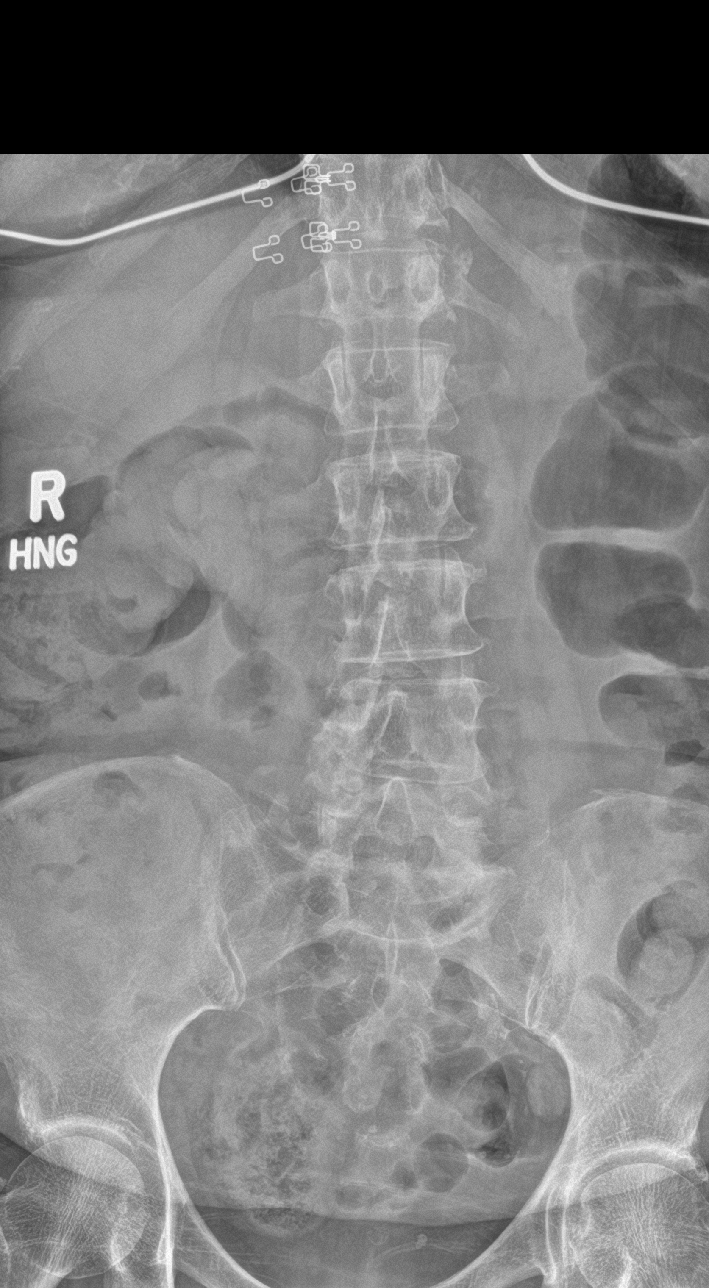

[l-spine obl (1 of 2)]
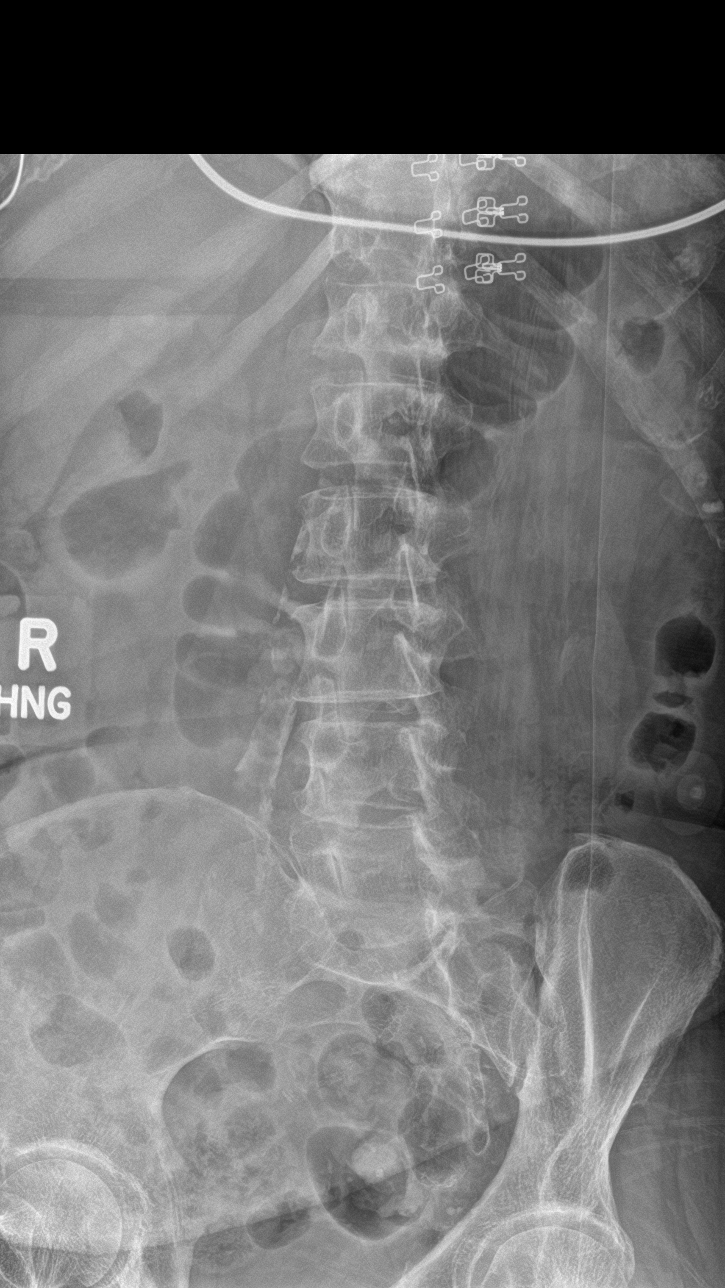

[l-spine obl (2 of 2)]
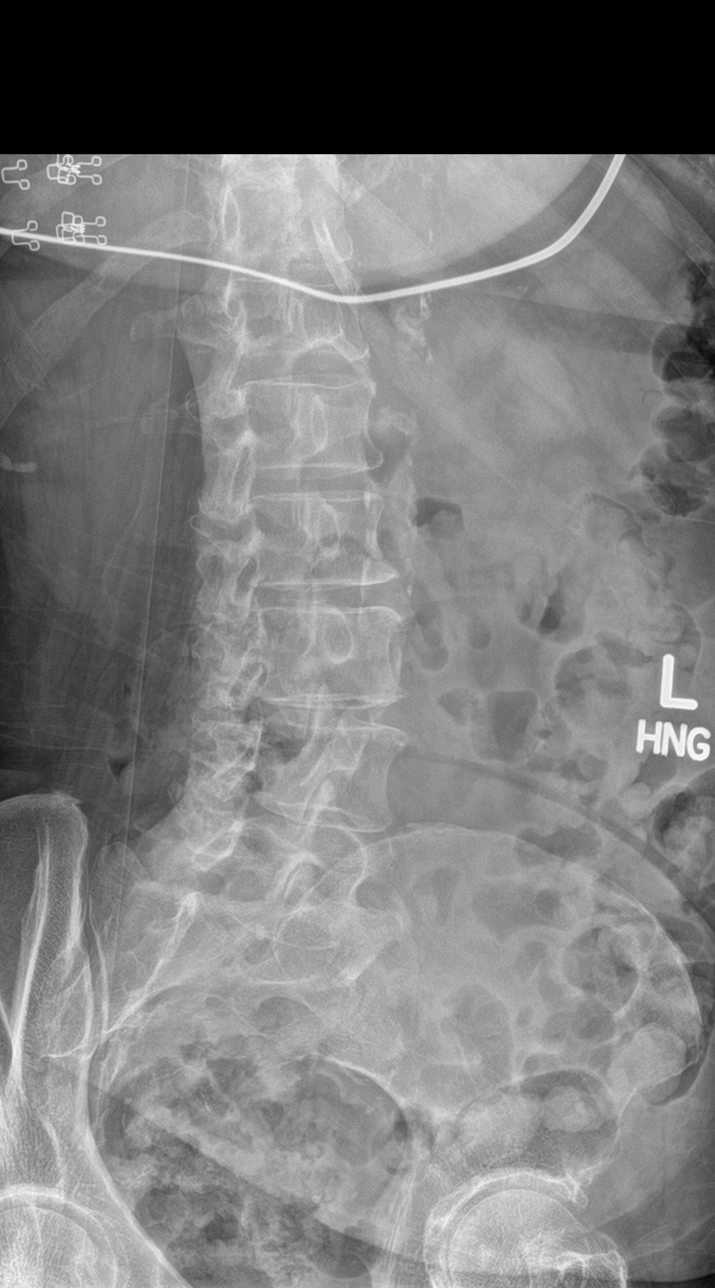

[l-spine lat]
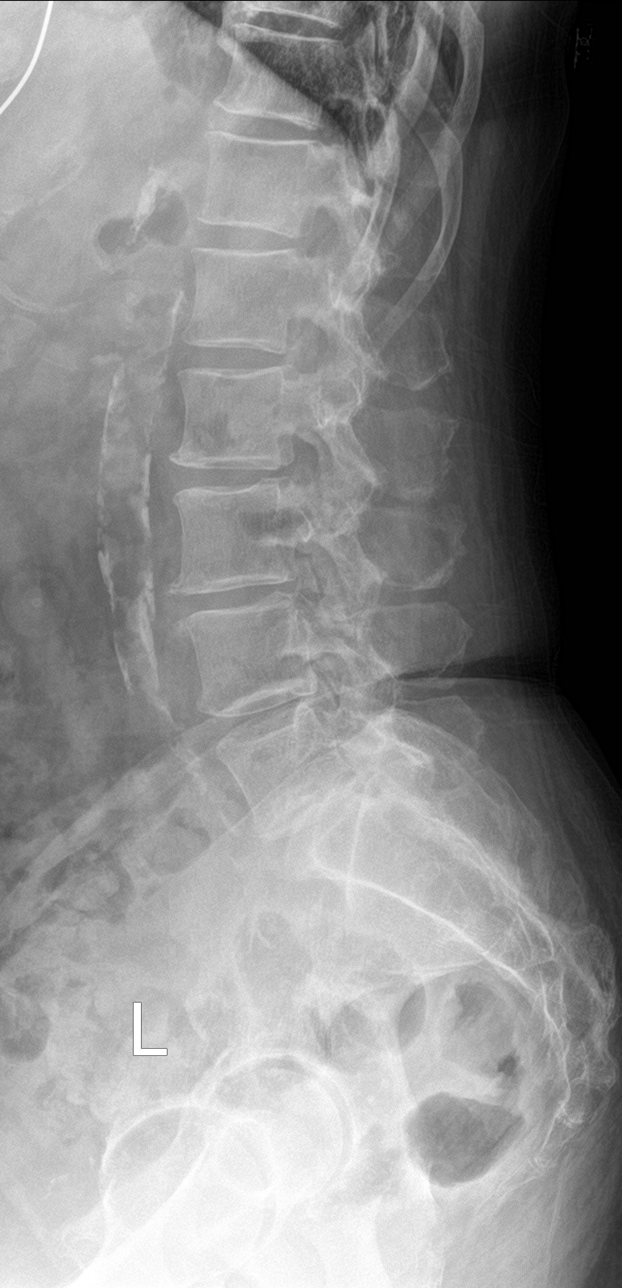

[l-spine spot]
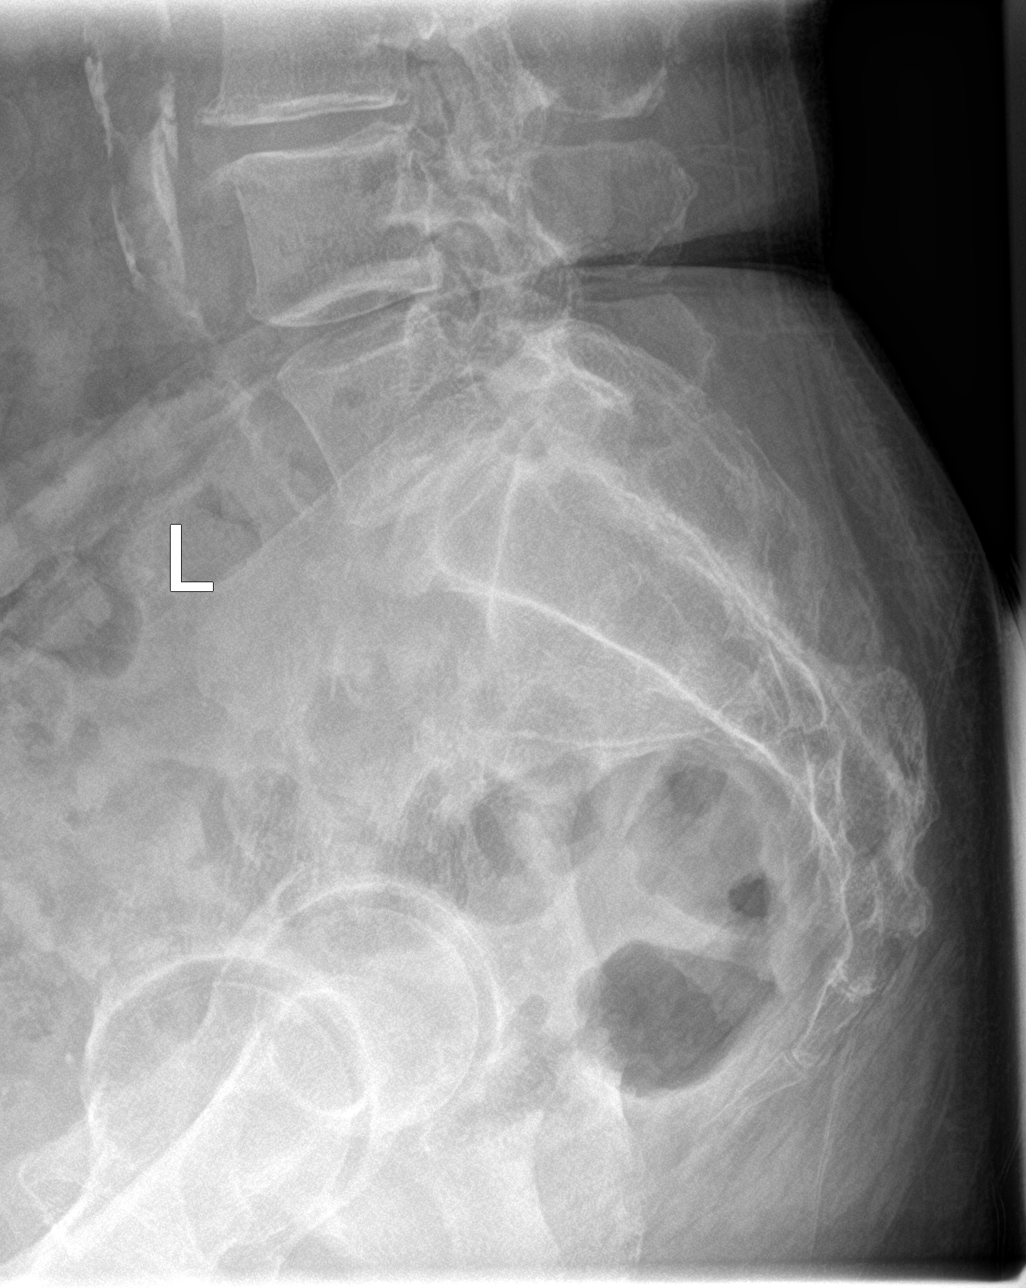

[5 of 5 positions shown; findings below may reference images not displayed]

FINDINGS: No acute fracture or subluxation of the lumbar spine. Mild lower
lumbar facet arthropathy. The visualized posterior elements are
intact. There is atherosclerotic calcification of the aorta.
Moderate stool within the colon.
IMPRESSION: No acute lumbar spine pathology.

## 2020-08-22 ENCOUNTER — Ambulatory Visit (INDEPENDENT_AMBULATORY_CARE_PROVIDER_SITE_OTHER): Payer: Medicare Other | Admitting: Surgical

## 2020-08-22 ENCOUNTER — Other Ambulatory Visit: Payer: Self-pay

## 2020-08-22 ENCOUNTER — Encounter: Payer: Self-pay | Admitting: Surgical

## 2020-08-22 VITALS — BP 167/74 | HR 65 | Ht <= 58 in | Wt 141.0 lb

## 2020-08-22 DIAGNOSIS — L910 Hypertrophic scar: Secondary | ICD-10-CM

## 2020-08-22 MED ORDER — ONDANSETRON HCL 4 MG PO TABS: 4.0000 mg | ORAL_TABLET | Freq: Three times a day (TID) | ORAL | 0 refills | Status: DC | PRN

## 2020-08-22 MED ORDER — CEPHALEXIN 500 MG PO CAPS: 500.0000 mg | ORAL_CAPSULE | Freq: Four times a day (QID) | ORAL | 0 refills | Status: AC

## 2020-08-22 NOTE — Progress Notes (Addendum)
Patient ID: Joanne Morris, female    DOB: 1950-04-24, 70 y.o.   MRN: [id number]  Chief Complaint  Patient presents with   Pre-op Exam      ICD-10-CM   1. Keloid scar  L91.0       History of Present Illness: Joanne Morris is a 70 y.o.  female  with a history of neck keloids after neck surgery.  She presents for preoperative evaluation for upcoming procedure, excision of right neck keloid, scheduled for 09/18/2020 with Dr. [name].  The patient has not had problems with anesthesia. No history of DVT/PE.  No family history of DVT/PE.  No family or personal history of bleeding or clotting disorders.  Patient is currently taking aspirin 81 mg daily and cilostazol.  Summary of Previous Visit: Patient with keloid scars on her neck.  She had carotid surgery in 2016 by Dr. [name].  She had excision of her right side keloids in 2018 with Dr. [name].  PMH Significant for: Anxiety, coronary artery disease, GERD, peripheral artery disease, history of myocardial infarction in 1990s, hypertension, high cholesterol, type 2 diabetes mellitus.  She has a stent in her right lower extremity.  She reports today that she has no nausea, vomiting, chest pain, shortness of breath, dizziness, weakness.  Reports no recent changes in her health   Past Medical History: Allergies: Allergies  Allergen Reactions   No Known Allergies     Current Medications:  Current Outpatient Medications:    Cholecalciferol 1.25 MG (50000 UT) capsule, Take 1 capsule by mouth once a week., Disp: , Rfl:    methocarbamol (ROBAXIN) 750 MG tablet, TAKE 1 TABLET BY MOUTH TWICE A DAY AS NEEDED MUSCLE SPASMS, Disp: , Rfl:    Niacin CR 1000 MG TBCR, , Disp: , Rfl:    ALPRAZolam (XANAX) 0.25 MG tablet, Take 1 tablet (0.25 mg total) by mouth daily as needed for anxiety., Disp: 3 tablet, Rfl: 0   amLODipine (NORVASC) 10 MG tablet, Take 10 mg by mouth daily., Disp: , Rfl: 5   aspirin EC 81 MG tablet, Take 81 mg by mouth daily.,  Disp: , Rfl:    Cholecalciferol (VITAMIN D3) 1.25 MG (50000 UT) CAPS, Take 1 capsule by mouth every Sunday. , Disp: , Rfl:    cilostazol (PLETAL) 100 MG tablet, Take 1 tablet (100 mg total) by mouth 2 (two) times daily before a meal., Disp: 60 tablet, Rfl: 11   cilostazol (PLETAL) 100 MG tablet, Take 1 tablet (100 mg total) by mouth 2 (two) times daily before a meal., Disp: 60 tablet, Rfl: 11   cyclobenzaprine (FLEXERIL) 10 MG tablet, Take 5 mg by mouth 3 (three) times daily as needed for muscle spasms., Disp: , Rfl:    ferrous sulfate 325 (65 FE) MG EC tablet, Take 325 mg by mouth every Sunday. , Disp: , Rfl:    hydrALAZINE (APRESOLINE) 50 MG tablet, , Disp: , Rfl:    HYDROcodone-acetaminophen (NORCO) 5-325 MG tablet, Take 1-2 tablets by mouth every 6 (six) hours as needed for moderate pain or severe pain., Disp: 12 tablet, Rfl: 0   Iron-FA-B Cmp-C-Biot-Probiotic (FUSION PLUS) CAPS, Take by mouth., Disp: , Rfl:    levocetirizine (XYZAL) 5 MG tablet, Take 5 mg by mouth daily., Disp: , Rfl:    metoprolol (LOPRESSOR) 100 MG tablet, Take 100 mg by mouth 2 (two) times daily., Disp: , Rfl:    MODERNA COVID-19 VACCINE 100 MCG/0.5ML injection, , Disp: , Rfl:    niacin (  NIASPAN) 1000 MG CR tablet, Take 1,000 mg by mouth daily. , Disp: , Rfl: 6   nitroGLYCERIN (NITROSTAT) 0.4 MG SL tablet, Place 0.4 mg under the tongue every 5 (five) minutes as needed for chest pain. , Disp: , Rfl:    omeprazole (PRILOSEC) 20 MG capsule, Take 20 mg by mouth daily., Disp: , Rfl:    polyethylene glycol powder (GLYCOLAX/MIRALAX) 17 GM/SCOOP powder, Take 17 g by mouth daily as needed for constipation., Disp: , Rfl:    pravastatin (PRAVACHOL) 40 MG tablet, Take 40 mg by mouth daily., Disp: , Rfl:    ranitidine (ZANTAC) 150 MG tablet, Take 150 mg by mouth 2 (two) times daily as needed for heartburn., Disp: , Rfl:    sertraline (ZOLOFT) 50 MG tablet, Take 50 mg by mouth every evening., Disp: , Rfl:    tiZANidine (ZANAFLEX) 4 MG  tablet, Take 4 mg by mouth at bedtime as needed for muscle spasms. , Disp: , Rfl: 2   valsartan (DIOVAN) 320 MG tablet, , Disp: , Rfl:    valsartan-hydrochlorothiazide (DIOVAN-HCT) 320-25 MG per tablet, Take 1 tablet by mouth daily. , Disp: , Rfl: 2   zolpidem (AMBIEN) 10 MG tablet, Take 0.5 tablets (5 mg total) by mouth at bedtime., Disp: , Rfl:   Past Medical Problems: Past Medical History:  Diagnosis Date   Angina    Anxiety    Arthritis    "knees" (07/20/2013)   Carotid stenosis    Carotid stenosis    Claudication (HCC)    Coronary artery disease    Depression    GERD (gastroesophageal reflux disease)    High cholesterol    Hypertension    Myocardial infarction [facility]) 1990's   "1"   PAD (peripheral artery disease) ([place])    Stroke ([place])    "mini stroke 1st then regular stroke", denies residual on 07/20/2013   Type II diabetes mellitus ([place])    Wears glasses     Past Surgical History: Past Surgical History:  Procedure Laterality Date   ABDOMINAL HYSTERECTOMY     ATHERECTOMY  10/05/2013   BALLOON ANGIOPLASTY, ARTERY  10/05/2013   DR [name]   CAROTID ENDARTERECTOMY     CORONARY ANGIOPLASTY WITH STENT PLACEMENT     "1"   ENDARTERECTOMY Left 02/07/2014   Procedure: ENDARTERECTOMY CAROTID;  Surgeon: Elam Dutch, MD;  Location: [place];  Service: Vascular;  Laterality: Left;   ENDARTERECTOMY Right 04/18/2014   Procedure: ENDARTERECTOMY RIGHT CAROTID;  Surgeon: Elam Dutch, MD;  Location: [place];  Service: Vascular;  Laterality: Right;   ENDARTERECTOMY N/A 04/24/2014   Procedure: IRRIGATION AND DEBRIDEMENT OF RIGHT NECK ;  Surgeon: Elam Dutch, MD;  Location: [place];  Service: Vascular;  Laterality: N/A;   ESOPHAGOGASTRODUODENOSCOPY N/A 10/13/2012   Procedure: ESOPHAGOGASTRODUODENOSCOPY (EGD);  Surgeon: Gatha Mayer, MD;  Location: [facility] ENDOSCOPY;  Service: Endoscopy;  Laterality: N/A;   KENALOG INJECTION Bilateral 08/22/2016   Procedure: KENALOG INJECTION BILATERAL NECK;   Surgeon: Wallace Going, DO;  Location: [place];  Service: Plastics;  Laterality: Bilateral;   LOWER EXTREMITY ANGIOGRAM  07/20/2013   Unsuccessful attempt at crossing the CTO/notes 07/20/2013   LOWER EXTREMITY ANGIOGRAM N/A 07/06/2013   Procedure: LOWER EXTREMITY ANGIOGRAM;  Surgeon: Laverda Page, MD;  Location: [facility] CATH LAB;  Service: Cardiovascular;  Laterality: N/A;   LOWER EXTREMITY ANGIOGRAM N/A 07/20/2013   Procedure: LOWER EXTREMITY ANGIOGRAM;  Surgeon: Laverda Page, MD;  Location: [facility] CATH LAB;  Service: Cardiovascular;  Laterality: N/A;   LOWER EXTREMITY ANGIOGRAM N/A 10/05/2013   Procedure: LOWER EXTREMITY ANGIOGRAM;  Surgeon: Laverda Page, MD;  Location: [facility] CATH LAB;  Service: Cardiovascular;  Laterality: N/A;   MASS EXCISION Right 08/22/2016   Procedure: EXCISION RIGHT NECK KELOID;  Surgeon: Wallace Going, DO;  Location: [place];  Service: Plastics;  Laterality: Right;   PATCH ANGIOPLASTY Left 02/07/2014   Procedure: PATCH ANGIOPLASTY Carotid;  Surgeon: Elam Dutch, MD;  Location: [place];  Service: Vascular;  Laterality: Left;   PATCH ANGIOPLASTY Right 04/18/2014   Procedure: PATCH ANGIOPLASTY USING HEMASHIELD 0.8cmx 7.6cm PATCH;  Surgeon: Elam Dutch, MD;  Location: [place];  Service: Vascular;  Laterality: Right;   PERIPHERAL VASCULAR CATHETERIZATION N/A 07/05/2014   Procedure: Lower Extremity Angiography;  Surgeon: [name], MD;  Location: [place] CV LAB;  Service: Cardiovascular;  Laterality: N/A;   TUBAL LIGATION      Social History: Social History   Socioeconomic History   Marital status: Legally Separated    Spouse name: Not on file   Number of children: Not on file   Years of education: Not on file   Highest education level: Not on file  Occupational History   Not on file  Tobacco Use   Smoking status: Former    Packs/day: 0.50    Years: 4.00    Pack years: 2.00    Types: Cigarettes    Quit date:  05/30/2009    Years since quitting: 11.2   Smokeless tobacco: Never  Vaping Use   Vaping Use: Never used  Substance and Sexual Activity   Alcohol use: No    Alcohol/week: 0.0 standard drinks   Drug use: No   Sexual activity: Yes    Birth control/protection: Post-menopausal  Other Topics Concern   Not on file  Social History Narrative   Not on file   Social Determinants of Health   Financial Resource Strain: Not on file  Food Insecurity: Not on file  Transportation Needs: Not on file  Physical Activity: Not on file  Stress: Not on file  Social Connections: Not on file  Intimate Partner Violence: Not on file    Family History: History reviewed. No pertinent family history.  Review of Systems: Review of Systems  Constitutional: Negative.   Respiratory: Negative.    Cardiovascular: Negative.   Gastrointestinal: Negative.   Neurological: Negative.    Physical Exam: Vital Signs BP (!) 167/74 (BP Location: Left Arm, Patient Position: Sitting, Cuff Size: Large)   Pulse 65   Ht '4\' 1"'$  (1.245 m)   Wt 141 lb (64 kg)   SpO2 97%   BMI 41.29 kg/m   Physical Exam  Constitutional:      General: Not in acute distress.    Appearance: Normal appearance. Not ill-appearing.  HENT:     Head: Normocephalic and atraumatic.  Eyes:     Pupils: Pupils are equal, round Neck:     Musculoskeletal: Normal range of motion.  Bilateral neck keloids noted.  Right worse than left Cardiovascular:     Rate and Rhythm: Normal rate    Pulses: Normal pulses.  Pulmonary:     Effort: Pulmonary effort is normal. No respiratory distress.  Abdominal:     General: Abdomen is flat. There is no distension.  Musculoskeletal: Normal range of motion.  Skin:    General: Skin is warm and dry.     Findings: No erythema or rash.  Neurological:  General: No focal deficit present.     Mental Status: Alert and oriented to person, place, and time. Mental status is at baseline.     Motor: No weakness.   Psychiatric:        Mood and Affect: Mood normal.        Behavior: Behavior normal.    Assessment/Plan: The patient is scheduled for excision of right neck keloid with Dr. [name].  Risks, benefits, and alternatives of procedure discussed, questions answered and consent obtained.    Smoking Status: Non-smoker; Counseling Given?  N/A  Caprini Score: 4, moderate; Risk Factors include: Age, BMI rater than 25, and length of planned surgery. Recommendation for mechanical and pharmacological prophylaxis while hospitalized. Encourage early ambulation.   Post-op Rx sent to pharmacy: Zofran, Keflex. Patient is currently on chronic Norco for chronic pain, postop pain control with Tylenol.  Will obtain clearance from PCP that prescribes narcotics for postop pain med to ensure patient is not on pain contract.  Patient was provided with the General Surgical Risk consent document and Pain Medication Agreement prior to their appointment.  They had adequate time to read through the risk consent documents and Pain Medication Agreement. We also discussed them in person together during this preop appointment. All of their questions were answered to their satisfaction.  Recommended calling if they have any further questions.  Risk consent form and Pain Medication Agreement to be scanned into patient's chart.  Patient is aware of the risk of the keloid returning after excision, I discussed with her that it is very likely, but the degree to which it returns is unknown.  Patient is understanding of this.  We will obtain cardiac and PCP clearance.  Will obtain clearance to hold aspirin and cilostazol preoperatively   Electronically signed by: Carola Rhine Rajah Lamba, PA-C 08/22/2020 2:51 PM

## 2020-08-22 NOTE — H&P (View-Only) (Signed)
Patient ID: Joanne Morris, female    DOB: 1950/03/31, 70 y.o.   MRN: [id number]  Chief Complaint  Patient presents with   Pre-op Exam      ICD-10-CM   1. Keloid scar  L91.0       History of Present Illness: Joanne Morris is a 70 y.o.  female  with a history of neck keloids after neck surgery.  She presents for preoperative evaluation for upcoming procedure, excision of right neck keloid, scheduled for 09/18/2020 with Dr. [name].  The patient has not had problems with anesthesia. No history of DVT/PE.  No family history of DVT/PE.  No family or personal history of bleeding or clotting disorders.  Patient is currently taking aspirin 81 mg daily and cilostazol.  Summary of Previous Visit: Patient with keloid scars on her neck.  She had carotid surgery in 2016 by Dr. [name].  She had excision of her right side keloids in 2018 with Dr. [name].  PMH Significant for: Anxiety, coronary artery disease, GERD, peripheral artery disease, history of myocardial infarction in 1990s, hypertension, high cholesterol, type 2 diabetes mellitus.  She has a stent in her right lower extremity.  She reports today that she has no nausea, vomiting, chest pain, shortness of breath, dizziness, weakness.  Reports no recent changes in her health   Past Medical History: Allergies: Allergies  Allergen Reactions   No Known Allergies     Current Medications:  Current Outpatient Medications:    Cholecalciferol 1.25 MG (50000 UT) capsule, Take 1 capsule by mouth once a week., Disp: , Rfl:    methocarbamol (ROBAXIN) 750 MG tablet, TAKE 1 TABLET BY MOUTH TWICE A DAY AS NEEDED MUSCLE SPASMS, Disp: , Rfl:    Niacin CR 1000 MG TBCR, , Disp: , Rfl:    ALPRAZolam (XANAX) 0.25 MG tablet, Take 1 tablet (0.25 mg total) by mouth daily as needed for anxiety., Disp: 3 tablet, Rfl: 0   amLODipine (NORVASC) 10 MG tablet, Take 10 mg by mouth daily., Disp: , Rfl: 5   aspirin EC 81 MG tablet, Take 81 mg by mouth daily.,  Disp: , Rfl:    Cholecalciferol (VITAMIN D3) 1.25 MG (50000 UT) CAPS, Take 1 capsule by mouth every Sunday. , Disp: , Rfl:    cilostazol (PLETAL) 100 MG tablet, Take 1 tablet (100 mg total) by mouth 2 (two) times daily before a meal., Disp: 60 tablet, Rfl: 11   cilostazol (PLETAL) 100 MG tablet, Take 1 tablet (100 mg total) by mouth 2 (two) times daily before a meal., Disp: 60 tablet, Rfl: 11   cyclobenzaprine (FLEXERIL) 10 MG tablet, Take 5 mg by mouth 3 (three) times daily as needed for muscle spasms., Disp: , Rfl:    ferrous sulfate 325 (65 FE) MG EC tablet, Take 325 mg by mouth every Sunday. , Disp: , Rfl:    hydrALAZINE (APRESOLINE) 50 MG tablet, , Disp: , Rfl:    HYDROcodone-acetaminophen (NORCO) 5-325 MG tablet, Take 1-2 tablets by mouth every 6 (six) hours as needed for moderate pain or severe pain., Disp: 12 tablet, Rfl: 0   Iron-FA-B Cmp-C-Biot-Probiotic (FUSION PLUS) CAPS, Take by mouth., Disp: , Rfl:    levocetirizine (XYZAL) 5 MG tablet, Take 5 mg by mouth daily., Disp: , Rfl:    metoprolol (LOPRESSOR) 100 MG tablet, Take 100 mg by mouth 2 (two) times daily., Disp: , Rfl:    MODERNA COVID-19 VACCINE 100 MCG/0.5ML injection, , Disp: , Rfl:    niacin (  NIASPAN) 1000 MG CR tablet, Take 1,000 mg by mouth daily. , Disp: , Rfl: 6   nitroGLYCERIN (NITROSTAT) 0.4 MG SL tablet, Place 0.4 mg under the tongue every 5 (five) minutes as needed for chest pain. , Disp: , Rfl:    omeprazole (PRILOSEC) 20 MG capsule, Take 20 mg by mouth daily., Disp: , Rfl:    polyethylene glycol powder (GLYCOLAX/MIRALAX) 17 GM/SCOOP powder, Take 17 g by mouth daily as needed for constipation., Disp: , Rfl:    pravastatin (PRAVACHOL) 40 MG tablet, Take 40 mg by mouth daily., Disp: , Rfl:    ranitidine (ZANTAC) 150 MG tablet, Take 150 mg by mouth 2 (two) times daily as needed for heartburn., Disp: , Rfl:    sertraline (ZOLOFT) 50 MG tablet, Take 50 mg by mouth every evening., Disp: , Rfl:    tiZANidine (ZANAFLEX) 4 MG  tablet, Take 4 mg by mouth at bedtime as needed for muscle spasms. , Disp: , Rfl: 2   valsartan (DIOVAN) 320 MG tablet, , Disp: , Rfl:    valsartan-hydrochlorothiazide (DIOVAN-HCT) 320-25 MG per tablet, Take 1 tablet by mouth daily. , Disp: , Rfl: 2   zolpidem (AMBIEN) 10 MG tablet, Take 0.5 tablets (5 mg total) by mouth at bedtime., Disp: , Rfl:   Past Medical Problems: Past Medical History:  Diagnosis Date   Angina    Anxiety    Arthritis    "knees" (07/20/2013)   Carotid stenosis    Carotid stenosis    Claudication (HCC)    Coronary artery disease    Depression    GERD (gastroesophageal reflux disease)    High cholesterol    Hypertension    Myocardial infarction [facility]) 1990's   "1"   PAD (peripheral artery disease) ([place])    Stroke ([place])    "mini stroke 1st then regular stroke", denies residual on 07/20/2013   Type II diabetes mellitus ([place])    Wears glasses     Past Surgical History: Past Surgical History:  Procedure Laterality Date   ABDOMINAL HYSTERECTOMY     ATHERECTOMY  10/05/2013   BALLOON ANGIOPLASTY, ARTERY  10/05/2013   DR [name]   CAROTID ENDARTERECTOMY     CORONARY ANGIOPLASTY WITH STENT PLACEMENT     "1"   ENDARTERECTOMY Left 02/07/2014   Procedure: ENDARTERECTOMY CAROTID;  Surgeon: Elam Dutch, MD;  Location: [place];  Service: Vascular;  Laterality: Left;   ENDARTERECTOMY Right 04/18/2014   Procedure: ENDARTERECTOMY RIGHT CAROTID;  Surgeon: Elam Dutch, MD;  Location: [place];  Service: Vascular;  Laterality: Right;   ENDARTERECTOMY N/A 04/24/2014   Procedure: IRRIGATION AND DEBRIDEMENT OF RIGHT NECK ;  Surgeon: Elam Dutch, MD;  Location: [place];  Service: Vascular;  Laterality: N/A;   ESOPHAGOGASTRODUODENOSCOPY N/A 10/13/2012   Procedure: ESOPHAGOGASTRODUODENOSCOPY (EGD);  Surgeon: Gatha Mayer, MD;  Location: [facility] ENDOSCOPY;  Service: Endoscopy;  Laterality: N/A;   KENALOG INJECTION Bilateral 08/22/2016   Procedure: KENALOG INJECTION BILATERAL NECK;   Surgeon: Wallace Going, DO;  Location: [place];  Service: Plastics;  Laterality: Bilateral;   LOWER EXTREMITY ANGIOGRAM  07/20/2013   Unsuccessful attempt at crossing the CTO/notes 07/20/2013   LOWER EXTREMITY ANGIOGRAM N/A 07/06/2013   Procedure: LOWER EXTREMITY ANGIOGRAM;  Surgeon: Laverda Page, MD;  Location: [facility] CATH LAB;  Service: Cardiovascular;  Laterality: N/A;   LOWER EXTREMITY ANGIOGRAM N/A 07/20/2013   Procedure: LOWER EXTREMITY ANGIOGRAM;  Surgeon: Laverda Page, MD;  Location: [facility] CATH LAB;  Service: Cardiovascular;  Laterality: N/A;   LOWER EXTREMITY ANGIOGRAM N/A 10/05/2013   Procedure: LOWER EXTREMITY ANGIOGRAM;  Surgeon: Laverda Page, MD;  Location: [facility] CATH LAB;  Service: Cardiovascular;  Laterality: N/A;   MASS EXCISION Right 08/22/2016   Procedure: EXCISION RIGHT NECK KELOID;  Surgeon: Wallace Going, DO;  Location: [place];  Service: Plastics;  Laterality: Right;   PATCH ANGIOPLASTY Left 02/07/2014   Procedure: PATCH ANGIOPLASTY Carotid;  Surgeon: Elam Dutch, MD;  Location: [place];  Service: Vascular;  Laterality: Left;   PATCH ANGIOPLASTY Right 04/18/2014   Procedure: PATCH ANGIOPLASTY USING HEMASHIELD 0.8cmx 7.6cm PATCH;  Surgeon: Elam Dutch, MD;  Location: [place];  Service: Vascular;  Laterality: Right;   PERIPHERAL VASCULAR CATHETERIZATION N/A 07/05/2014   Procedure: Lower Extremity Angiography;  Surgeon: [name], MD;  Location: [place] CV LAB;  Service: Cardiovascular;  Laterality: N/A;   TUBAL LIGATION      Social History: Social History   Socioeconomic History   Marital status: Legally Separated    Spouse name: Not on file   Number of children: Not on file   Years of education: Not on file   Highest education level: Not on file  Occupational History   Not on file  Tobacco Use   Smoking status: Former    Packs/day: 0.50    Years: 4.00    Pack years: 2.00    Types: Cigarettes    Quit date:  05/30/2009    Years since quitting: 11.2   Smokeless tobacco: Never  Vaping Use   Vaping Use: Never used  Substance and Sexual Activity   Alcohol use: No    Alcohol/week: 0.0 standard drinks   Drug use: No   Sexual activity: Yes    Birth control/protection: Post-menopausal  Other Topics Concern   Not on file  Social History Narrative   Not on file   Social Determinants of Health   Financial Resource Strain: Not on file  Food Insecurity: Not on file  Transportation Needs: Not on file  Physical Activity: Not on file  Stress: Not on file  Social Connections: Not on file  Intimate Partner Violence: Not on file    Family History: History reviewed. No pertinent family history.  Review of Systems: Review of Systems  Constitutional: Negative.   Respiratory: Negative.    Cardiovascular: Negative.   Gastrointestinal: Negative.   Neurological: Negative.    Physical Exam: Vital Signs BP (!) 167/74 (BP Location: Left Arm, Patient Position: Sitting, Cuff Size: Large)   Pulse 65   Ht '4\' 1"'$  (1.245 m)   Wt 141 lb (64 kg)   SpO2 97%   BMI 41.29 kg/m   Physical Exam  Constitutional:      General: Not in acute distress.    Appearance: Normal appearance. Not ill-appearing.  HENT:     Head: Normocephalic and atraumatic.  Eyes:     Pupils: Pupils are equal, round Neck:     Musculoskeletal: Normal range of motion.  Bilateral neck keloids noted.  Right worse than left Cardiovascular:     Rate and Rhythm: Normal rate    Pulses: Normal pulses.  Pulmonary:     Effort: Pulmonary effort is normal. No respiratory distress.  Abdominal:     General: Abdomen is flat. There is no distension.  Musculoskeletal: Normal range of motion.  Skin:    General: Skin is warm and dry.     Findings: No erythema or rash.  Neurological:  General: No focal deficit present.     Mental Status: Alert and oriented to person, place, and time. Mental status is at baseline.     Motor: No weakness.   Psychiatric:        Mood and Affect: Mood normal.        Behavior: Behavior normal.    Assessment/Plan: The patient is scheduled for excision of right neck keloid with Dr. [name].  Risks, benefits, and alternatives of procedure discussed, questions answered and consent obtained.    Smoking Status: Non-smoker; Counseling Given?  N/A  Caprini Score: 4, moderate; Risk Factors include: Age, BMI rater than 25, and length of planned surgery. Recommendation for mechanical and pharmacological prophylaxis while hospitalized. Encourage early ambulation.   Post-op Rx sent to pharmacy: Zofran, Keflex. Patient is currently on chronic Norco for chronic pain, postop pain control with Tylenol.  Will obtain clearance from PCP that prescribes narcotics for postop pain med to ensure patient is not on pain contract.  Patient was provided with the General Surgical Risk consent document and Pain Medication Agreement prior to their appointment.  They had adequate time to read through the risk consent documents and Pain Medication Agreement. We also discussed them in person together during this preop appointment. All of their questions were answered to their satisfaction.  Recommended calling if they have any further questions.  Risk consent form and Pain Medication Agreement to be scanned into patient's chart.  Patient is aware of the risk of the keloid returning after excision, I discussed with her that it is very likely, but the degree to which it returns is unknown.  Patient is understanding of this.  We will obtain cardiac and PCP clearance.  Will obtain clearance to hold aspirin and cilostazol preoperatively   Electronically signed by: Carola Rhine Danesha Kirchoff, PA-C 08/22/2020 2:51 PM

## 2020-08-24 ENCOUNTER — Other Ambulatory Visit: Payer: Self-pay

## 2020-08-24 ENCOUNTER — Telehealth: Payer: Self-pay

## 2020-08-24 DIAGNOSIS — I739 Peripheral vascular disease, unspecified: Secondary | ICD-10-CM

## 2020-08-24 NOTE — Telephone Encounter (Signed)
Received letter for surgical clearance from D[facility]ham's office to remove a neck keloid from patient. She could stop Pletal for 5 days, but we have not seen patient since 04/2019 and is well overdue for 6 month follow up. Called patient and scheduled visit with PA and ABI - unable to schedule lab/visit on same day as Clark in office due to lab availability prior to patient's scheduled surgery, so placed patient on schedule for next Wednesday. She is not currently having issues. Advised I would send in surgical clearance after visit.

## 2020-08-28 ENCOUNTER — Encounter: Payer: Self-pay | Admitting: Surgical

## 2020-08-28 NOTE — Progress Notes (Addendum)
Surgical Clearance has been received from Barbera Setters, NP for patient's upcoming surgery excision of right neck keloid with Dr. [name]. Medications to hold prior to surgery:  Aspirin, may hold 5 days prior to procedure. Cilostazol -managed by vascular surgeon Dr. Carlis Abbott  Clearance received from Dr. Carlis Abbott, hold asa '81mg'$  and cilostazol 5 days prior, can restart 1 day post-op is hemostasis achieved.  Called patient, she agreed and understood to hold.

## 2020-08-30 ENCOUNTER — Encounter: Payer: Self-pay | Admitting: Physician Assistant

## 2020-08-30 ENCOUNTER — Other Ambulatory Visit: Payer: Self-pay

## 2020-08-30 ENCOUNTER — Ambulatory Visit (HOSPITAL_COMMUNITY)
Admission: RE | Admit: 2020-08-30 | Discharge: 2020-08-30 | Disposition: A | Discharge status: Home / Self Care | Payer: Medicare Other | Source: Ambulatory Visit | Attending: Vascular Surgery | Admitting: Vascular Surgery

## 2020-08-30 ENCOUNTER — Ambulatory Visit (INDEPENDENT_AMBULATORY_CARE_PROVIDER_SITE_OTHER): Payer: Medicare Other | Admitting: Physician Assistant

## 2020-08-30 VITALS — BP 119/65 | HR 64 | Temp 97.5°F | Resp 20 | Ht <= 58 in | Wt 142.3 lb

## 2020-08-30 DIAGNOSIS — I739 Peripheral vascular disease, unspecified: Secondary | ICD-10-CM

## 2020-08-30 NOTE — Progress Notes (Signed)
[facility] HISTORY AND PHYSICAL   History of Present Illness:  Patient is a 70 y.o. year old female who presents for evaluation of PAD.  She was last seen a year ago and her ABI 0.9 on the right biphasic and 0.69 on the left monophasic.  She denise rest pain and non healing wounds.   She was previously under care of Dr. [name].  She has previously undergone a right peroneal atherectomy 2015.  She has single-vessel runoff in the peroneal artery on the right per his note as well as single vessel peroneal runoff on the left.  She feels both legs have the same level of discomfort.  States she can walk one mile before stopping.  She was prescribed Pletal 100 mg twice daily to see if that will help any of her symptoms and walking distances.  She denise current symptoms of claudication, rest pain or non healing wounds.   She has plastic surgery planned with Dr. [name] 09/18/20 to remove neck keloid scars. And wanted to make sure she can come off her Pletal for the surgery.    Past Medical History:  Diagnosis Date   Angina    Anxiety    Arthritis    "knees" (07/20/2013)   Carotid stenosis    Carotid stenosis    Claudication (HCC)    Coronary artery disease    Depression    GERD (gastroesophageal reflux disease)    High cholesterol    Hypertension    Myocardial infarction [facility]) 1990's   "1"   PAD (peripheral artery disease) ([place])    Stroke ([place])    "mini stroke 1st then regular stroke", denies residual on 07/20/2013   Type II diabetes mellitus ([place])    Wears glasses     Past Surgical History:  Procedure Laterality Date   ABDOMINAL HYSTERECTOMY     ATHERECTOMY  10/05/2013   BALLOON ANGIOPLASTY, ARTERY  10/05/2013   DR [name]   CAROTID ENDARTERECTOMY     CORONARY ANGIOPLASTY WITH STENT PLACEMENT     "1"   ENDARTERECTOMY Left 02/07/2014   Procedure: ENDARTERECTOMY CAROTID;  Surgeon: Elam Dutch, MD;  Location: [place];  Service: Vascular;  Laterality: Left;    ENDARTERECTOMY Right 04/18/2014   Procedure: ENDARTERECTOMY RIGHT CAROTID;  Surgeon: Elam Dutch, MD;  Location: [place];  Service: Vascular;  Laterality: Right;   ENDARTERECTOMY N/A 04/24/2014   Procedure: IRRIGATION AND DEBRIDEMENT OF RIGHT NECK ;  Surgeon: Elam Dutch, MD;  Location: [place];  Service: Vascular;  Laterality: N/A;   ESOPHAGOGASTRODUODENOSCOPY N/A 10/13/2012   Procedure: ESOPHAGOGASTRODUODENOSCOPY (EGD);  Surgeon: Gatha Mayer, MD;  Location: [facility] ENDOSCOPY;  Service: Endoscopy;  Laterality: N/A;   KENALOG INJECTION Bilateral 08/22/2016   Procedure: KENALOG INJECTION BILATERAL NECK;  Surgeon: Wallace Going, DO;  Location: [place];  Service: Plastics;  Laterality: Bilateral;   LOWER EXTREMITY ANGIOGRAM  07/20/2013   Unsuccessful attempt at crossing the CTO/notes 07/20/2013   LOWER EXTREMITY ANGIOGRAM N/A 07/06/2013   Procedure: LOWER EXTREMITY ANGIOGRAM;  Surgeon: Laverda Page, MD;  Location: [facility] CATH LAB;  Service: Cardiovascular;  Laterality: N/A;   LOWER EXTREMITY ANGIOGRAM N/A 07/20/2013   Procedure: LOWER EXTREMITY ANGIOGRAM;  Surgeon: Laverda Page, MD;  Location: [facility] CATH LAB;  Service: Cardiovascular;  Laterality: N/A;   LOWER EXTREMITY ANGIOGRAM N/A 10/05/2013   Procedure: LOWER EXTREMITY ANGIOGRAM;  Surgeon: Laverda Page, MD;  Location: [facility] CATH LAB;  Service: Cardiovascular;  Laterality: N/A;   MASS EXCISION Right 08/22/2016   Procedure: EXCISION RIGHT NECK KELOID;  Surgeon: Wallace Going, DO;  Location: [place];  Service: Plastics;  Laterality: Right;   PATCH ANGIOPLASTY Left 02/07/2014   Procedure: PATCH ANGIOPLASTY Carotid;  Surgeon: Elam Dutch, MD;  Location: [place];  Service: Vascular;  Laterality: Left;   PATCH ANGIOPLASTY Right 04/18/2014   Procedure: PATCH ANGIOPLASTY USING HEMASHIELD 0.8cmx 7.6cm PATCH;  Surgeon: Elam Dutch, MD;  Location: [place];  Service: Vascular;  Laterality: Right;   PERIPHERAL  VASCULAR CATHETERIZATION N/A 07/05/2014   Procedure: Lower Extremity Angiography;  Surgeon: [name], MD;  Location: [place] CV LAB;  Service: Cardiovascular;  Laterality: N/A;   TUBAL LIGATION      ROS:   General:  No weight loss, Fever, chills  HEENT: No recent headaches, no nasal bleeding, no visual changes, no sore throat  Neurologic: No dizziness, blackouts, seizures. No recent symptoms of stroke or mini- stroke. No recent episodes of slurred speech, or temporary blindness.  Cardiac: No recent episodes of chest pain/pressure, no shortness of breath at rest.  No shortness of breath with exertion.  Denies history of atrial fibrillation or irregular heartbeat  Vascular: No history of rest pain in feet.  No history of claudication.  No history of non-healing ulcer, No history of DVT   Pulmonary: No home oxygen, no productive cough, no hemoptysis,  No asthma or wheezing  Musculoskeletal:  '[ ]'$  Arthritis, '[ ]'$  Low back pain,  '[ ]'$  Joint pain  Hematologic:No history of hypercoagulable state.  No history of easy bleeding.  No history of anemia  Gastrointestinal: No hematochezia or melena,  No gastroesophageal reflux, no trouble swallowing  Urinary: '[ ]'$  chronic Kidney disease, '[ ]'$  on HD - '[ ]'$  MWF or '[ ]'$  TTHS, '[ ]'$  Burning with urination, '[ ]'$  Frequent urination, '[ ]'$  Difficulty urinating;   Skin: No rashes  Psychological: No history of anxiety,  No history of depression  Social History Social History   Tobacco Use   Smoking status: Former    Packs/day: 0.50    Years: 4.00    Pack years: 2.00    Types: Cigarettes    Quit date: 05/30/2009    Years since quitting: 11.2   Smokeless tobacco: Never  Vaping Use   Vaping Use: Never used  Substance Use Topics   Alcohol use: No    Alcohol/week: 0.0 standard drinks   Drug use: No    Family History No family history on file.  Allergies  Allergies  Allergen Reactions   No Known Allergies      Current Outpatient Medications   Medication Sig Dispense Refill   amLODipine (NORVASC) 10 MG tablet Take 10 mg by mouth daily.  5   aspirin EC 81 MG tablet Take 81 mg by mouth daily.     Cholecalciferol (VITAMIN D3) 1.25 MG (50000 UT) CAPS Take 1 capsule by mouth every Sunday.      Cholecalciferol 1.25 MG (50000 UT) capsule Take 1 capsule by mouth once a week.     cilostazol (PLETAL) 100 MG tablet Take 1 tablet (100 mg total) by mouth 2 (two) times daily before a meal. 60 tablet 11   cilostazol (PLETAL) 100 MG tablet Take 1 tablet (100 mg total) by mouth 2 (two) times daily before a meal. 60 tablet 11   FARXIGA 5 MG TABS tablet      ferrous sulfate 325 (65 FE) MG EC  tablet Take 325 mg by mouth every Sunday.      hydrALAZINE (APRESOLINE) 50 MG tablet      levocetirizine (XYZAL) 5 MG tablet Take 5 mg by mouth daily.     metoprolol (LOPRESSOR) 100 MG tablet Take 100 mg by mouth 2 (two) times daily.     niacin (NIASPAN) 1000 MG CR tablet Take 1,000 mg by mouth daily.   6   Niacin CR 1000 MG TBCR      nitroGLYCERIN (NITROSTAT) 0.4 MG SL tablet Place 0.4 mg under the tongue every 5 (five) minutes as needed for chest pain.      omeprazole (PRILOSEC) 20 MG capsule Take 20 mg by mouth daily.     ondansetron (ZOFRAN) 4 MG tablet Take 1 tablet (4 mg total) by mouth every 8 (eight) hours as needed for nausea or vomiting. 20 tablet 0   polyethylene glycol powder (GLYCOLAX/MIRALAX) 17 GM/SCOOP powder Take 17 g by mouth daily as needed for constipation.     pravastatin (PRAVACHOL) 40 MG tablet Take 40 mg by mouth daily.     ranitidine (ZANTAC) 150 MG tablet Take 150 mg by mouth 2 (two) times daily as needed for heartburn.     sertraline (ZOLOFT) 50 MG tablet Take 50 mg by mouth every evening.     valsartan (DIOVAN) 320 MG tablet      valsartan-hydrochlorothiazide (DIOVAN-HCT) 320-25 MG per tablet Take 1 tablet by mouth daily.   2   zolpidem (AMBIEN) 10 MG tablet Take 0.5 tablets (5 mg total) by mouth at bedtime.     ALPRAZolam (XANAX)  0.25 MG tablet Take 1 tablet (0.25 mg total) by mouth daily as needed for anxiety. (Patient not taking: Reported on 08/30/2020) 3 tablet 0   cyclobenzaprine (FLEXERIL) 10 MG tablet Take 5 mg by mouth 3 (three) times daily as needed for muscle spasms. (Patient not taking: Reported on 08/30/2020)     HYDROcodone-acetaminophen (NORCO) 5-325 MG tablet Take 1-2 tablets by mouth every 6 (six) hours as needed for moderate pain or severe pain. 12 tablet 0   Iron-FA-B Cmp-C-Biot-Probiotic (FUSION PLUS) CAPS Take by mouth.     methocarbamol (ROBAXIN) 750 MG tablet TAKE 1 TABLET BY MOUTH TWICE A DAY AS NEEDED MUSCLE SPASMS (Patient not taking: Reported on 08/30/2020)     MODERNA COVID-19 VACCINE 100 MCG/0.5ML injection  (Patient not taking: Reported on 08/30/2020)     tiZANidine (ZANAFLEX) 4 MG tablet Take 4 mg by mouth at bedtime as needed for muscle spasms.  (Patient not taking: Reported on 08/30/2020)  2   No current facility-administered medications for this visit.    Physical Examination  Vitals:   08/30/20 1508  BP: 119/65  Pulse: 64  Resp: 20  Temp: (!) 97.5 F (36.4 C)  TempSrc: Temporal  SpO2: 97%  Weight: 142 lb 4.8 oz (64.5 kg)  Height: '4\' 1"'$  (1.245 m)    Body mass index is 41.67 kg/m.  General:  Alert and oriented, no acute distress HEENT: Normal Neck: No bruit or JVD Pulmonary: Clear to auscultation bilaterally Cardiac: Regular Rate and Rhythm without murmur Abdomen: Soft, non-tender, non-distended, no mass, no scars Skin: No rash, B large keloid scars on right and left neck.  Extremity Pulses:  2+ radial, brachial, femoral, dorsalis pedis, posterior tibial pulses bilaterally Musculoskeletal: No deformity or edema  Neurologic: Upper and lower extremity motor 5/5 and symmetric  DATA:     ABI Findings:  +---------+------------------+-----+--------+--------+  Right    Rt Pressure (mmHg)IndexWaveformComment   +---------+------------------+-----+--------+--------+  Brachial 153                                      +---------+------------------+-----+--------+--------+  ATA      128               0.84 biphasic          +---------+------------------+-----+--------+--------+  PTA      126               0.82 biphasic          +---------+------------------+-----+--------+--------+  Great Toe95                0.62 Abnormal          +---------+------------------+-----+--------+--------+   +---------+------------------+-----+--------+-------+  Left     Lt Pressure (mmHg)IndexWaveformComment  +---------+------------------+-----+--------+-------+  ATA      108               0.71 biphasic         +---------+------------------+-----+--------+-------+  PTA      78                0.51 biphasic         +---------+------------------+-----+--------+-------+  Great Toe81                0.53 Abnormal         +---------+------------------+-----+--------+-------+   +-------+-----------+-----------+------------+------------+  ABI/TBIToday's ABIToday's TBIPrevious ABIPrevious TBI  +-------+-----------+-----------+------------+------------+  Right  0.84       0.62       0.92        0.74          +-------+-----------+-----------+------------+------------+  Left   0.71       0.53       0.69        0.6           +-------+-----------+-----------+------------+------------+     Bilateral ABIs appear essentially unchanged.     Summary:  Right: Resting right ankle-brachial index indicates mild right lower  extremity arterial disease. The right toe-brachial index is abnormal.   Left: Resting left ankle-brachial index indicates moderate left lower  extremity arterial disease. The left toe-brachial index is abnormal.    ASSESSMENT/Plan: Keloid scars B right and left neck area after B CEA's in 2015/2016 by Dr. [name].  She may be taken off her Pletal for Dr. Eusebio Friendly surgery 5 days prior to surgery and  then restart it 5 days after surgery.  She is asymptomatic for claudication, rest pain or non healing wounds B LE.  He ABI's are basically unchanged from a year ago.  I have encouraged her to walk for exercise as she tolerates.    She will f/u in 1 year for repeat ABI's and Carotid duplex B.       Roxy Horseman PA-C [facility] [place] Office: [phone]  MD in clinic [place]

## 2020-09-08 ENCOUNTER — Telehealth: Payer: Self-pay

## 2020-09-08 NOTE — Telephone Encounter (Signed)
Patient called to find out which medications she needs to stop taking for her surgery on 09/18/2020.  Please call.

## 2020-09-08 NOTE — Telephone Encounter (Signed)
Returned patients call. Advised to stop ASA and Cilostazol 5 days prior to her surgery, 09/18/20 and restart 09/19/2020. Patient understood and agreed with plan.

## 2020-09-11 ENCOUNTER — Telehealth: Payer: Self-pay

## 2020-09-11 NOTE — Telephone Encounter (Signed)
Returned patients call. LMVM  Advised to stop ASA and Cilostazol 5 days prior to her surgery 09/13/20, surgery is on 09/18/20 and restart 09/19/2020.  Call back if she has any further questions.

## 2020-09-11 NOTE — Telephone Encounter (Signed)
Patient called to find out which medicines the doctor wants her to stop taking.  Please call.

## 2020-09-12 ENCOUNTER — Telehealth: Payer: Self-pay

## 2020-09-12 ENCOUNTER — Telehealth: Payer: Self-pay | Admitting: Plastic Surgery

## 2020-09-12 NOTE — Telephone Encounter (Signed)
Joanne Morris spoke with Joanne Morris, reread the instructions from when I discussed with her  previously during a phone conversation as to the dates when to stop and start aspirin and Cilostazol.

## 2020-09-12 NOTE — Telephone Encounter (Signed)
Patient would like to know which medications she should stop taking prior to her surgery on 8/8. Please call her to advise so she can stop taking those meds in enough time. [phone]

## 2020-09-12 NOTE — Telephone Encounter (Signed)
Patient called to find out which medications she needs to stop taking.  I reviewed Joanne Morris' note for the patient.  Patient appeared to understand the instructions.

## 2020-09-14 ENCOUNTER — Other Ambulatory Visit: Payer: Self-pay

## 2020-09-14 ENCOUNTER — Encounter (HOSPITAL_COMMUNITY)
Admission: RE | Admit: 2020-09-14 | Discharge: 2020-09-14 | Disposition: A | Discharge status: Home / Self Care | Payer: Medicare Other | Source: Ambulatory Visit | Attending: Infectious Disease | Admitting: Infectious Disease

## 2020-09-14 DIAGNOSIS — Z79899 Other long term (current) drug therapy: Secondary | ICD-10-CM | POA: Insufficient documentation

## 2020-09-14 DIAGNOSIS — I2510 Atherosclerotic heart disease of native coronary artery without angina pectoris: Secondary | ICD-10-CM | POA: Diagnosis not present

## 2020-09-14 DIAGNOSIS — L910 Hypertrophic scar: Secondary | ICD-10-CM | POA: Diagnosis not present

## 2020-09-14 DIAGNOSIS — Z01818 Encounter for other preprocedural examination: Secondary | ICD-10-CM | POA: Insufficient documentation

## 2020-09-14 DIAGNOSIS — Z7982 Long term (current) use of aspirin: Secondary | ICD-10-CM | POA: Diagnosis not present

## 2020-09-14 DIAGNOSIS — E1151 Type 2 diabetes mellitus with diabetic peripheral angiopathy without gangrene: Secondary | ICD-10-CM | POA: Diagnosis not present

## 2020-09-14 DIAGNOSIS — I10 Essential (primary) hypertension: Secondary | ICD-10-CM | POA: Diagnosis not present

## 2020-09-14 DIAGNOSIS — Z7984 Long term (current) use of oral hypoglycemic drugs: Secondary | ICD-10-CM | POA: Diagnosis not present

## 2020-09-14 DIAGNOSIS — Z8673 Personal history of transient ischemic attack (TIA), and cerebral infarction without residual deficits: Secondary | ICD-10-CM | POA: Insufficient documentation

## 2020-09-14 DIAGNOSIS — Z8616 Personal history of COVID-19: Secondary | ICD-10-CM | POA: Insufficient documentation

## 2020-09-14 DIAGNOSIS — I252 Old myocardial infarction: Secondary | ICD-10-CM | POA: Diagnosis not present

## 2020-09-14 DIAGNOSIS — Z87891 Personal history of nicotine dependence: Secondary | ICD-10-CM | POA: Diagnosis not present

## 2020-09-14 LAB — CBC
HCT: 42.9 % (ref 36.0–46.0)
Hemoglobin: 13.3 g/dL (ref 12.0–15.0)
MCH: 24.6 pg — ABNORMAL LOW (ref 26.0–34.0)
MCHC: 31 g/dL (ref 30.0–36.0)
MCV: 79.3 fL — ABNORMAL LOW (ref 80.0–100.0)
Platelets: 325 10*3/uL (ref 150–400)
RBC: 5.41 MIL/uL — ABNORMAL HIGH (ref 3.87–5.11)
RDW: 16.6 % — ABNORMAL HIGH (ref 11.5–15.5)
WBC: 8.8 10*3/uL (ref 4.0–10.5)
nRBC: 0 % (ref 0.0–0.2)

## 2020-09-14 LAB — BASIC METABOLIC PANEL
Anion gap: 8 (ref 5–15)
BUN: 18 mg/dL (ref 8–23)
CO2: 26 mmol/L (ref 22–32)
Calcium: 10.5 mg/dL — ABNORMAL HIGH (ref 8.9–10.3)
Chloride: 104 mmol/L (ref 98–111)
Creatinine, Ser: 1.27 mg/dL — ABNORMAL HIGH (ref 0.44–1.00)
GFR, Estimated: 46 mL/min — ABNORMAL LOW (ref >60)
Glucose, Bld: 180 mg/dL — ABNORMAL HIGH (ref 70–99)
Potassium: 4.2 mmol/L (ref 3.5–5.1)
Sodium: 138 mmol/L (ref 135–145)

## 2020-09-14 LAB — SURGICAL PCR SCREEN
MRSA, PCR: NEGATIVE
Staphylococcus aureus: POSITIVE — AB

## 2020-09-14 LAB — GLUCOSE, CAPILLARY: Glucose-Capillary: 117 mg/dL — ABNORMAL HIGH (ref 70–99)

## 2020-09-14 NOTE — Progress Notes (Signed)
Surgical Instructions    Your procedure is scheduled on Monday August 8th.  Report to [facility] Main Entrance "A" at 5:30 A.M., then check in with the Admitting office.  Call this number if you have problems the morning of surgery:  [phone]   If you have any questions prior to your surgery date call [phone]: Open Monday-Friday 8am-4pm    Remember:  Do not eat after midnight the night before your surgery  You may drink clear liquids until 4:30am the morning of your surgery.   Clear liquids allowed are: Water, Non-Citrus Juices (without pulp), Carbonated Beverages, Clear Tea, Black Coffee Only, and Gatorade    Take these medicines the morning of surgery with A SIP OF WATER  amLODipine (NORVASC) 10 MG tablet ferrous sulfate 325 (65 FE) MG EC tablet hydrALAZINE (APRESOLINE) 50 MG tablet metoprolol (LOPRESSOR) 100 MG tablet HYDROcodone-acetaminophen (NORCO) 5-325 MG tablet omeprazole (PRILOSEC) 20 MG capsule pravastatin (PRAVACHOL) 40 MG tablet   IF NEEDED ALPRAZolam (XANAX) 0.25 MG tablet levocetirizine (XYZAL) 5 MG tablet HYDROcodone-acetaminophen (NORCO) 5-325 MG tablet   As of today, STOP taking any Aspirin (unless otherwise instructed by your surgeon) Aleve, Naproxen, Ibuprofen, Motrin, Advil, Goody's, BC's, all herbal medications, fish oil, and all vitamins.     WHAT DO I DO ABOUT MY DIABETES MEDICATION?   Do not take oral diabetes medicines (pills) the morning of surgery.  Do not  take glipiZIDE (GLUCOTROL XL)  or FARXIGA 5 MG the morning of surgery.  If your CBG is greater than 220 mg/dL, you may take  of your sliding scale (correction) dose of insulin.   HOW TO MANAGE YOUR DIABETES BEFORE AND AFTER SURGERY  Why is it important to control my blood sugar before and after surgery? Improving blood sugar levels before and after surgery helps healing and can limit problems. A way of improving blood sugar control is eating a healthy diet by:  Eating less  sugar and carbohydrates  Increasing activity/exercise  Talking with your doctor about reaching your blood sugar goals High blood sugars (greater than 180 mg/dL) can raise your risk of infections and slow your recovery, so you will need to focus on controlling your diabetes during the weeks before surgery. Make sure that the doctor who takes care of your diabetes knows about your planned surgery including the date and location.  How do I manage my blood sugar before surgery? Check your blood sugar at least 4 times a day, starting 2 days before surgery, to make sure that the level is not too high or low.  Check your blood sugar the morning of your surgery when you wake up and every 2 hours until you get to the Short Stay unit.  If your blood sugar is less than 70 mg/dL, you will need to treat for low blood sugar: Do not take insulin. Treat a low blood sugar (less than 70 mg/dL) with  cup of clear juice (cranberry or apple), 4 glucose tablets, OR glucose gel. Recheck blood sugar in 15 minutes after treatment (to make sure it is greater than 70 mg/dL). If your blood sugar is not greater than 70 mg/dL on recheck, call [phone] for further instructions. Report your blood sugar to the short stay nurse when you get to Short Stay.  If you are admitted to the hospital after surgery: Your blood sugar will be checked by the staff and you will probably be given insulin after surgery (instead of oral diabetes medicines) to make sure you have good  blood sugar levels. The goal for blood sugar control after surgery is 80-180 mg/dL.         Do not wear jewelry or makeup Do not wear lotions, powders, perfumes, or deodorant. Do not shave 48 hours prior to surgery.   Do not bring valuables to the hospital. DO Not wear nail polish, gel polish, artificial nails, or any other type of covering on  natural nails including finger and toenails. If patients have artificial nails, gel coating, etc. that need to be  removed by a nail salon please have this removed prior to surgery or surgery may need to be canceled/delayed if the surgeon/ anesthesia feels like the patient is unable to be adequately monitored.             [facility] is not responsible for any belongings or valuables.  Do NOT Smoke (Tobacco/Vaping) or drink Alcohol 24 hours prior to your procedure If you use a CPAP at night, you may bring all equipment for your overnight stay.   Contacts, glasses, dentures or bridgework may not be worn into surgery, please bring cases for these belongings   For patients admitted to the hospital, discharge time will be determined by your treatment team.   Patients discharged the day of surgery will not be allowed to drive home, and someone needs to stay with them for 24 hours.  ONLY 1 SUPPORT PERSON MAY BE PRESENT WHILE YOU ARE IN SURGERY. IF YOU ARE TO BE ADMITTED ONCE YOU ARE IN YOUR ROOM YOU WILL BE ALLOWED TWO (2) VISITORS.  Minor children may have two parents present. Special consideration for safety and communication needs will be reviewed on a case by case basis.  Special instructions:    Oral Hygiene is also important to reduce your risk of infection.  Remember - BRUSH YOUR TEETH THE MORNING OF SURGERY WITH YOUR REGULAR TOOTHPASTE   [place]- Preparing For Surgery  Before surgery, you can play an important role. Because skin is not sterile, your skin needs to be as free of germs as possible. You can reduce the number of germs on your skin by washing with CHG (chlorahexidine gluconate) Soap before surgery.  CHG is an antiseptic cleaner which kills germs and bonds with the skin to continue killing germs even after washing.     Please do not use if you have an allergy to CHG or antibacterial soaps. If your skin becomes reddened/irritated stop using the CHG.  Do not shave (including legs and underarms) for at least 48 hours prior to first CHG shower. It is OK to shave your face.  Please follow  these instructions carefully.     Shower the NIGHT BEFORE SURGERY and the MORNING OF SURGERY with CHG Soap.   If you chose to wash your hair, wash your hair first as usual with your normal shampoo. After you shampoo, rinse your hair and body thoroughly to remove the shampoo.  Then [employer] and genitals (private parts) with your normal soap and rinse thoroughly to remove soap.  After that Use CHG Soap as you would any other liquid soap. You can apply CHG directly to the skin and wash gently with a scrungie or a clean washcloth.   Apply the CHG Soap to your body ONLY FROM THE NECK DOWN.  Do not use on open wounds or open sores. Avoid contact with your eyes, ears, mouth and genitals (private parts). Wash Face and genitals (private parts)  with your normal soap.   Wash  thoroughly, paying special attention to the area where your surgery will be performed.  Thoroughly rinse your body with warm water from the neck down.  DO NOT shower/wash with your normal soap after using and rinsing off the CHG Soap.  Pat yourself dry with a CLEAN TOWEL.  Wear CLEAN PAJAMAS to bed the night before surgery  Place CLEAN SHEETS on your bed the night before your surgery  DO NOT SLEEP WITH PETS.   Day of Surgery:  Take a shower with CHG soap. Wear Clean/Comfortable clothing the morning of surgery Do not apply any deodorants/lotions.   Remember to brush your teeth WITH YOUR REGULAR TOOTHPASTE.   Please read over the following fact sheets that you were given.

## 2020-09-14 NOTE — Progress Notes (Signed)
PCP - [name] NP Cardiologist - Murvin Natal MD  PPM/ICD - denies Device Orders -  Rep Notified -   Chest x-ray - no EKG - 09/14/20 Stress Test - 06/03/11 ECHO - 05/31/11 Cardiac Cath - 2005  Sleep Study - no CPAP -   Fasting Blood Sugar - 100-110's Checks Blood Sugar once a month  Blood Thinner Instructions: Aspirin Instructions:per surgeon  ERAS Protcol - yes-Clears until 0430 PRE-SURGERY Ensure or G2- no  COVID TEST- n/a- ambulatory surgery   Anesthesia review: yes-cardiac history, abnormal EKG's   Patient denies shortness of breath, fever, cough and chest pain at PAT appointment   All instructions explained to the patient, with a verbal understanding of the material. Patient agrees to go over the instructions while at home for a better understanding. Patient also instructed to self quarantine after being tested for COVID-19. The opportunity to ask questions was provided.

## 2020-09-14 NOTE — Progress Notes (Signed)
No orders. Called Dr. Elenor Quinones office for orders. Spoke to [place].

## 2020-09-14 NOTE — Progress Notes (Signed)
Surgical Instructions    Your procedure is scheduled on Monday August 8th.  Report to [facility] Main Entrance "A" at 5:30 A.M., then check in with the Admitting office.  Call this number if you have problems the morning of surgery:  [phone]   If you have any questions prior to your surgery date call [phone]: Open Monday-Friday 8am-4pm    Remember:  Do not eat after midnight the night before your surgery  You may drink clear liquids until 4:30am the morning of your surgery.   Clear liquids allowed are: Water, Non-Citrus Juices (without pulp), Carbonated Beverages, Clear Tea, Black Coffee Only, and Gatorade    Take these medicines the morning of surgery with A SIP OF WATER  amLODipine (NORVASC) 10 MG tablet ferrous sulfate 325 (65 FE) MG EC tablet hydrALAZINE (APRESOLINE) 50 MG tablet metoprolol (LOPRESSOR) 100 MG tablet HYDROcodone-acetaminophen (NORCO) 5-325 MG tablet omeprazole (PRILOSEC) 20 MG capsule pravastatin (PRAVACHOL) 40 MG tablet   IF NEEDED ALPRAZolam (XANAX) 0.25 MG tablet levocetirizine (XYZAL) 5 MG tablet HYDROcodone-acetaminophen (NORCO) 5-325 MG tablet   As of today, STOP taking any Aspirin (unless otherwise instructed by your surgeon) Aleve, Naproxen, Ibuprofen, Motrin, Advil, Goody's, BC's, all herbal medications, fish oil, and all vitamins.          Do not wear jewelry or makeup Do not wear lotions, powders, perfumes, or deodorant. Do not shave 48 hours prior to surgery.   Do not bring valuables to the hospital. DO Not wear nail polish, gel polish, artificial nails, or any other type of covering on  natural nails including finger and toenails. If patients have artificial nails, gel coating, etc. that need to be removed by a nail salon please have this removed prior to surgery or surgery may need to be canceled/delayed if the surgeon/ anesthesia feels like the patient is unable to be adequately monitored.             [place] is not  responsible for any belongings or valuables.  Do NOT Smoke (Tobacco/Vaping) or drink Alcohol 24 hours prior to your procedure If you use a CPAP at night, you may bring all equipment for your overnight stay.   Contacts, glasses, dentures or bridgework may not be worn into surgery, please bring cases for these belongings   For patients admitted to the hospital, discharge time will be determined by your treatment team.   Patients discharged the day of surgery will not be allowed to drive home, and someone needs to stay with them for 24 hours.  ONLY 1 SUPPORT PERSON MAY BE PRESENT WHILE YOU ARE IN SURGERY. IF YOU ARE TO BE ADMITTED ONCE YOU ARE IN YOUR ROOM YOU WILL BE ALLOWED TWO (2) VISITORS.  Minor children may have two parents present. Special consideration for safety and communication needs will be reviewed on a case by case basis.  Special instructions:    Oral Hygiene is also important to reduce your risk of infection.  Remember - BRUSH YOUR TEETH THE MORNING OF SURGERY WITH YOUR REGULAR TOOTHPASTE   [facility]- Preparing For Surgery  Before surgery, you can play an important role. Because skin is not sterile, your skin needs to be as free of germs as possible. You can reduce the number of germs on your skin by washing with CHG (chlorahexidine gluconate) Soap before surgery.  CHG is an antiseptic cleaner which kills germs and bonds with the skin to continue killing germs even after washing.     Please do  not use if you have an allergy to CHG or antibacterial soaps. If your skin becomes reddened/irritated stop using the CHG.  Do not shave (including legs and underarms) for at least 48 hours prior to first CHG shower. It is OK to shave your face.  Please follow these instructions carefully.     Shower the NIGHT BEFORE SURGERY and the MORNING OF SURGERY with CHG Soap.   If you chose to wash your hair, wash your hair first as usual with your normal shampoo. After you shampoo, rinse your  hair and body thoroughly to remove the shampoo.  Then [employer] and genitals (private parts) with your normal soap and rinse thoroughly to remove soap.  After that Use CHG Soap as you would any other liquid soap. You can apply CHG directly to the skin and wash gently with a scrungie or a clean washcloth.   Apply the CHG Soap to your body ONLY FROM THE NECK DOWN.  Do not use on open wounds or open sores. Avoid contact with your eyes, ears, mouth and genitals (private parts). Wash Face and genitals (private parts)  with your normal soap.   Wash thoroughly, paying special attention to the area where your surgery will be performed.  Thoroughly rinse your body with warm water from the neck down.  DO NOT shower/wash with your normal soap after using and rinsing off the CHG Soap.  Pat yourself dry with a CLEAN TOWEL.  Wear CLEAN PAJAMAS to bed the night before surgery  Place CLEAN SHEETS on your bed the night before your surgery  DO NOT SLEEP WITH PETS.   Day of Surgery:  Take a shower with CHG soap. Wear Clean/Comfortable clothing the morning of surgery Do not apply any deodorants/lotions.   Remember to brush your teeth WITH YOUR REGULAR TOOTHPASTE.   Please read over the following fact sheets that you were given.

## 2020-09-15 NOTE — Anesthesia Preprocedure Evaluation (Addendum)
Anesthesia Evaluation  Patient identified by MRN, date of birth, ID band Patient awake    Reviewed: Allergy & Precautions, NPO status , Patient's Chart, lab work & pertinent test results  Airway Mallampati: II  TM Distance: >3 FB Neck ROM: Full    Dental  (+) Edentulous Upper, Poor Dentition, Chipped, Missing, Dental Advisory Given,    Pulmonary former smoker,    breath sounds clear to auscultation       Cardiovascular hypertension, Pt. on medications and Pt. on home beta blockers + CAD, + Past MI and + Peripheral Vascular Disease   Rhythm:Regular Rate:Normal     Neuro/Psych PSYCHIATRIC DISORDERS Anxiety Depression CVA    GI/Hepatic Neg liver ROS, GERD  Medicated,  Endo/Other  diabetes, Type 2, Oral Hypoglycemic Agents  Renal/GU      Musculoskeletal   Abdominal Normal abdominal exam  (+)   Peds  Hematology   Anesthesia Other Findings   Reproductive/Obstetrics                           Anesthesia Physical Anesthesia Plan  ASA: 3  Anesthesia Plan: General   Post-op Pain Management:    Induction: Intravenous  PONV Risk Score and Plan: 4 or greater and Ondansetron, Dexamethasone, Midazolam and Treatment may vary due to age or medical condition  Airway Management Planned: LMA  Additional Equipment: None  Intra-op Plan:   Post-operative Plan: Extubation in OR  Informed Consent: I have reviewed the patients History and Physical, chart, labs and discussed the procedure including the risks, benefits and alternatives for the proposed anesthesia with the patient or authorized representative who has indicated his/her understanding and acceptance.       Plan Discussed with: CRNA  Anesthesia Plan Comments: (PAT note written 09/15/2020 by Myra Gianotti, PA-C. )      Anesthesia Quick Evaluation

## 2020-09-15 NOTE — Progress Notes (Signed)
Anesthesia Chart Review:  Case: [id number] Date/Time: 09/18/20 0715   Procedure: Excision of right neck keloid (Right: Neck)   Anesthesia type: General   Pre-op diagnosis: keloid scar   Location: [facility] OR ROOM 07 / [place] OR   Surgeons: Wallace Going, DO       DISCUSSION: Patient is a 70 year old female scheduled for the above procedure.  History includes former smoker (quit 05/30/09), HTN, CAD/angina (MI 1990's; s/p RCA stent ~ 2008 [place]), DM2, PAD (with claudication, unsuccessful attempt right peroneal artery angioplasty 07/20/13, s/p right tibioperoneal trunk atherectomy/angioplasty 10/05/13), CVA/TIA, carotid artery disease (left carotid endarterectomy 01/19/14; right CEA 04/18/14, s/p incision & drainage of a seroma 04/24/14), GERD, right neck keloid (/sp excision 08/22/16), COVID-19 07/31/18.  Last cardiology visit Milton Ferguson, NP on 05/12/2020 (see Care Everywhere).  She wrote "Overall she is to be managing well from cardiac standpoint. She is not describing any anginal type symptoms today. I will make no change to hismedical regimen she will see Dr. Otho Perl in 1 year".  Per Scheeler, Rodman Key, PA-C with Dr. [name],  "Surgical Clearance has been received from Barbera Setters, NP for patient's upcoming surgery excision of right neck keloid with Dr. [name]. Medications to hold prior to surgery:   Aspirin, may hold 5 days prior to procedure. Cilostazol -managed by vascular surgeon Dr. Carlis Abbott   Clearance received from Dr. Carlis Abbott, hold asa '81mg'$  and cilostazol 5 days prior, can restart 1 day post-op is hemostasis achieved.   Called patient, she agreed and understood to hold."  I don't see a recent A1c result. She reported home fasting CBGs ~ 100-110s, but only checks about once/month.   Anesthesia team to evaluate on the day of surgery.    VS: BP 133/64   Pulse 74   Temp 36.9 C   Resp 18   Ht '5\' 1"'$  (1.549 m)   SpO2 98%   BMI 26.89 kg/m    PROVIDERS: [name], NP is PCP  Abran Richard, MD is Cardiologist ([facility]). Last visit 05/12/20 with Milton Ferguson, NP.  (Previously had seen [name], MD at [facility].) Monica Martinez, MD is vascular surgeon, previously Ruta Hinds, MD but had to switch due to office day availability. See on 05/04/19. Trial of Pletal for PAD/claudication given. No significant recurrent ICA stenosis on [place]. Referral to Dr. [name] for keloids at neck incision sites.   LABS: Labs reviewed: Acceptable for surgery. (all labs ordered are listed, but only abnormal results are displayed)  Labs Reviewed  SURGICAL PCR SCREEN - Abnormal; Notable for the following components:      Result Value   Staphylococcus aureus POSITIVE (*)    All other components within normal limits  GLUCOSE, CAPILLARY - Abnormal; Notable for the following components:   Glucose-Capillary 117 (*)    All other components within normal limits  BASIC METABOLIC PANEL - Abnormal; Notable for the following components:   Glucose, Bld 180 (*)    Creatinine, Ser 1.27 (*)    Calcium 10.5 (*)    GFR, Estimated 46 (*)    All other components within normal limits  CBC - Abnormal; Notable for the following components:   RBC 5.41 (*)    MCV 79.3 (*)    MCH 24.6 (*)    RDW 16.6 (*)    All other components within normal limits    EKG: 09/14/20:  Normal sinus rhythm T wave abnormality, consider inferior ischemia T wave abnormality, consider anterolateral ischemia Abnormal ECG - T  wave abnormality was mo flat/non-specific on 11/06/17 tracing, but tracing probably not significantly changed when compared to 10/11/12 tracing or 06/24/13 EKG from [facility].    CV: 08/07/14 Carotid US 05/04/19: Summary:  - Right Carotid: Velocities in the right ICA are consistent with a 1-39%  stenosis.  - Left Carotid: Velocities in the left ICA are consistent with a 1-39%  stenosis.  - Vertebrals:  Bilateral vertebral  arteries demonstrate antegrade flow.  - Subclavians: Normal flow hemodynamics were seen in bilateral subclavian  arteries.   Echo 01/21/19 ([facility]): Summary  Mild concentric left ventricular hypertrophy  Normal left ventricular size and systolic function with no appreciable  segmental abnormality.  Ejection fraction is visually estimated at 60-65%  Mild mitral regurgitation  No change compared to echo from 12/2017     Nuclear stress test 06/07/13 [facility] CV; scanned under Media tab, Correspondence Enc 02/07/14): Resting EKG NSR, poor r wave progression. Non-diagnostic stress EKG. No ST/T changes of ischemia noted with pharmacologic stress testing. Stress symptoms included being winded. Stress terminated due to completion of protocol. The perfusion study demonstrated normal isotope uptake both at rest and stress. There was no evidence of ischemia or scar.  Dynamic gated images reveal normal wall motion and endocardial thickening. LVEF estimated at 73%.   Cardiac cath 02/01/10 ([facility]; scanned under Media tab, Correspondence Enc 02/07/14):):  Summary: RCA stent patent with mild in-stent restenosis (40% distal RCA, 35% proximal RCA).  Other vessels have non-obstructive disease as before, LAD is slightly worse (35% mid LAD). LCx normal. LV gram shows mild inferobasal hypokinesis.  Recommendation: Medical therapy.   Past Medical History:  Diagnosis Date   Angina    Anxiety    Arthritis    "knees" (07/20/2013)   Carotid stenosis    Carotid stenosis    Claudication (HCC)    Coronary artery disease    Depression    GERD (gastroesophageal reflux disease)    High cholesterol    Hypertension    Myocardial infarction [facility]) 1990's   "1"   PAD (peripheral artery disease) ([place])    Stroke ([place])    "mini stroke 1st then regular stroke", denies residual on 07/20/2013   Type II diabetes mellitus ([place])    Wears glasses     Past Surgical History:  Procedure Laterality Date   ABDOMINAL  HYSTERECTOMY     ATHERECTOMY  10/05/2013   BALLOON ANGIOPLASTY, ARTERY  10/05/2013   DR [name]   CAROTID ENDARTERECTOMY     CORONARY ANGIOPLASTY WITH STENT PLACEMENT     "1"   ENDARTERECTOMY Left 02/07/2014   Procedure: ENDARTERECTOMY CAROTID;  Surgeon: Elam Dutch, MD;  Location: [place];  Service: Vascular;  Laterality: Left;   ENDARTERECTOMY Right 04/18/2014   Procedure: ENDARTERECTOMY RIGHT CAROTID;  Surgeon: Elam Dutch, MD;  Location: [place];  Service: Vascular;  Laterality: Right;   ENDARTERECTOMY N/A 04/24/2014   Procedure: IRRIGATION AND DEBRIDEMENT OF RIGHT NECK ;  Surgeon: Elam Dutch, MD;  Location: [place];  Service: Vascular;  Laterality: N/A;   ESOPHAGOGASTRODUODENOSCOPY N/A 10/13/2012   Procedure: ESOPHAGOGASTRODUODENOSCOPY (EGD);  Surgeon: Gatha Mayer, MD;  Location: [facility] ENDOSCOPY;  Service: Endoscopy;  Laterality: N/A;   KENALOG INJECTION Bilateral 08/22/2016   Procedure: KENALOG INJECTION BILATERAL NECK;  Surgeon: Wallace Going, DO;  Location: [place];  Service: Plastics;  Laterality: Bilateral;   LOWER EXTREMITY ANGIOGRAM  07/20/2013   Unsuccessful attempt at crossing the CTO/notes 07/20/2013   LOWER EXTREMITY  ANGIOGRAM N/A 07/06/2013   Procedure: LOWER EXTREMITY ANGIOGRAM;  Surgeon: Laverda Page, MD;  Location: [facility] CATH LAB;  Service: Cardiovascular;  Laterality: N/A;   LOWER EXTREMITY ANGIOGRAM N/A 07/20/2013   Procedure: LOWER EXTREMITY ANGIOGRAM;  Surgeon: Laverda Page, MD;  Location: [facility] CATH LAB;  Service: Cardiovascular;  Laterality: N/A;   LOWER EXTREMITY ANGIOGRAM N/A 10/05/2013   Procedure: LOWER EXTREMITY ANGIOGRAM;  Surgeon: Laverda Page, MD;  Location: [facility] CATH LAB;  Service: Cardiovascular;  Laterality: N/A;   MASS EXCISION Right 08/22/2016   Procedure: EXCISION RIGHT NECK KELOID;  Surgeon: Wallace Going, DO;  Location: [place];  Service: Plastics;  Laterality: Right;   PATCH ANGIOPLASTY Left  02/07/2014   Procedure: PATCH ANGIOPLASTY Carotid;  Surgeon: Elam Dutch, MD;  Location: [facility] [facility];  Service: Vascular;  Laterality: Left;   PATCH ANGIOPLASTY Right 04/18/2014   Procedure: PATCH ANGIOPLASTY USING HEMASHIELD 0.8cmx 7.6cm PATCH;  Surgeon: Elam Dutch, MD;  Location: [place];  Service: Vascular;  Laterality: Right;   PERIPHERAL VASCULAR CATHETERIZATION N/A 07/05/2014   Procedure: Lower Extremity Angiography;  Surgeon: [name], MD;  Location: [place] CV LAB;  Service: Cardiovascular;  Laterality: N/A;   TUBAL LIGATION      MEDICATIONS:  ALPRAZolam (XANAX) 0.25 MG tablet   amLODipine (NORVASC) 10 MG tablet   aspirin EC 81 MG tablet   CALCIUM-VITAMIN D PO   Cholecalciferol (VITAMIN D3 PO)   cilostazol (PLETAL) 100 MG tablet   cilostazol (PLETAL) 100 MG tablet   FARXIGA 5 MG TABS tablet   ferrous sulfate 325 (65 FE) MG EC tablet   glipiZIDE (GLUCOTROL XL) 5 MG 24 hr tablet   hydrALAZINE (APRESOLINE) 50 MG tablet   HYDROcodone-acetaminophen (NORCO) 5-325 MG tablet   levocetirizine (XYZAL) 5 MG tablet   metoprolol (LOPRESSOR) 100 MG tablet   niacin (NIASPAN) 500 MG CR tablet   omeprazole (PRILOSEC) 20 MG capsule   ondansetron (ZOFRAN) 4 MG tablet   polyethylene glycol powder (GLYCOLAX/MIRALAX) 17 GM/SCOOP powder   pravastatin (PRAVACHOL) 40 MG tablet   sertraline (ZOLOFT) 50 MG tablet   tiZANidine (ZANAFLEX) 4 MG tablet   valsartan (DIOVAN) 320 MG tablet   Vitamin D, Ergocalciferol, (DRISDOL) 1.25 MG (50000 UNIT) CAPS capsule   zolpidem (AMBIEN CR) 12.5 MG CR tablet   zolpidem (AMBIEN) 10 MG tablet   No current facility-administered medications for this encounter.    Myra Gianotti, PA-C Surgical Short Stay/Anesthesiology [facility] Phone [phone] [facility] Phone [phone] 09/15/2020 12:07 PM

## 2020-09-17 NOTE — Progress Notes (Signed)
09/17/2020 11:07 AM EDT by Mabeline Caras, RN 09/17/2020 11:07 AM EDT by Mabeline Caras, RN  Incoming Trixie Rude (Self)       Pt called asking about what medications she should take prior to surgery tomorrow. She was instructed to only take the medications listed on her PAT sheet: amlodipine, hydralazine, metoprolol, norco, omeprazole, and pravastatin. Ferrous sulfate was also listed on sheet, pt instructed not to take this. Pt continue asking if she could take several other medications she normally takes, she was instructed not to take anything other than those 6 listed. This was gone over in great detail several times. Pt asked about diabetic medications. She was instructed not to take any diabetic pills tomorrow prior to surgery. She should only take her glipizide morning/lunch dose today, not to take her [place]. She states she already took her [place] today as this list did not tell her to hold. I told her to let the preop nurse know in the morning that she had her dose today.

## 2020-09-18 ENCOUNTER — Ambulatory Visit (HOSPITAL_COMMUNITY): Payer: Medicare Other | Admitting: Certified Registered Nurse Anesthetist

## 2020-09-18 ENCOUNTER — Encounter (HOSPITAL_COMMUNITY)
Admission: RE | Disposition: A | Discharge status: Home / Self Care | Payer: Self-pay | Source: Home / Self Care | Attending: Plastic Surgery

## 2020-09-18 ENCOUNTER — Encounter (HOSPITAL_COMMUNITY): Payer: Self-pay | Admitting: Plastic Surgery

## 2020-09-18 ENCOUNTER — Ambulatory Visit (HOSPITAL_COMMUNITY): Payer: Medicare Other | Admitting: Vascular Surgery

## 2020-09-18 ENCOUNTER — Ambulatory Visit (HOSPITAL_COMMUNITY)
Admission: RE | Admit: 2020-09-18 | Discharge: 2020-09-18 | Disposition: A | Discharge status: Home / Self Care | Payer: Medicare Other | Attending: Plastic Surgery | Admitting: Plastic Surgery

## 2020-09-18 ENCOUNTER — Other Ambulatory Visit: Payer: Self-pay | Admitting: Surgical

## 2020-09-18 DIAGNOSIS — N179 Acute kidney failure, unspecified: Secondary | ICD-10-CM | POA: Diagnosis not present

## 2020-09-18 DIAGNOSIS — L905 Scar conditions and fibrosis of skin: Secondary | ICD-10-CM | POA: Diagnosis not present

## 2020-09-18 DIAGNOSIS — G8929 Other chronic pain: Secondary | ICD-10-CM | POA: Diagnosis not present

## 2020-09-18 DIAGNOSIS — Z95820 Peripheral vascular angioplasty status with implants and grafts: Secondary | ICD-10-CM | POA: Diagnosis not present

## 2020-09-18 DIAGNOSIS — Z87891 Personal history of nicotine dependence: Secondary | ICD-10-CM | POA: Diagnosis not present

## 2020-09-18 DIAGNOSIS — L910 Hypertrophic scar: Secondary | ICD-10-CM | POA: Insufficient documentation

## 2020-09-18 DIAGNOSIS — Z955 Presence of coronary angioplasty implant and graft: Secondary | ICD-10-CM | POA: Insufficient documentation

## 2020-09-18 DIAGNOSIS — Z79899 Other long term (current) drug therapy: Secondary | ICD-10-CM | POA: Insufficient documentation

## 2020-09-18 DIAGNOSIS — E1151 Type 2 diabetes mellitus with diabetic peripheral angiopathy without gangrene: Secondary | ICD-10-CM | POA: Diagnosis not present

## 2020-09-18 DIAGNOSIS — N189 Chronic kidney disease, unspecified: Secondary | ICD-10-CM | POA: Diagnosis not present

## 2020-09-18 DIAGNOSIS — K219 Gastro-esophageal reflux disease without esophagitis: Secondary | ICD-10-CM | POA: Diagnosis not present

## 2020-09-18 DIAGNOSIS — I2510 Atherosclerotic heart disease of native coronary artery without angina pectoris: Secondary | ICD-10-CM | POA: Diagnosis not present

## 2020-09-18 DIAGNOSIS — I252 Old myocardial infarction: Secondary | ICD-10-CM | POA: Insufficient documentation

## 2020-09-18 DIAGNOSIS — L730 Acne keloid: Secondary | ICD-10-CM | POA: Diagnosis not present

## 2020-09-18 DIAGNOSIS — Z7982 Long term (current) use of aspirin: Secondary | ICD-10-CM | POA: Diagnosis not present

## 2020-09-18 DIAGNOSIS — D631 Anemia in chronic kidney disease: Secondary | ICD-10-CM | POA: Diagnosis not present

## 2020-09-18 DIAGNOSIS — E7800 Pure hypercholesterolemia, unspecified: Secondary | ICD-10-CM | POA: Insufficient documentation

## 2020-09-18 DIAGNOSIS — I129 Hypertensive chronic kidney disease with stage 1 through stage 4 chronic kidney disease, or unspecified chronic kidney disease: Secondary | ICD-10-CM | POA: Diagnosis not present

## 2020-09-18 HISTORY — PX: MASS EXCISION: SHX2000

## 2020-09-18 LAB — GLUCOSE, CAPILLARY
Glucose-Capillary: 219 mg/dL — ABNORMAL HIGH (ref 70–99)
Glucose-Capillary: 157 mg/dL — ABNORMAL HIGH (ref 70–99)

## 2020-09-18 SURGERY — EXCISION MASS
Anesthesia: General | Site: Neck | Laterality: Right

## 2020-09-18 MED ORDER — ONDANSETRON HCL 4 MG PO TABS: 4.0000 mg | ORAL_TABLET | Freq: Three times a day (TID) | ORAL | 0 refills | Status: DC | PRN

## 2020-09-18 MED ORDER — MIDAZOLAM HCL 2 MG/2ML IJ SOLN
INTRAMUSCULAR | Status: AC
  Filled 2020-09-18: qty 2

## 2020-09-18 MED ORDER — MIDAZOLAM HCL 2 MG/2ML IJ SOLN
INTRAMUSCULAR | Status: DC | PRN
  Administered 2020-09-18: 2 mg via INTRAVENOUS

## 2020-09-18 MED ORDER — DEXAMETHASONE SODIUM PHOSPHATE 10 MG/ML IJ SOLN
INTRAMUSCULAR | Status: AC
  Filled 2020-09-18: qty 1

## 2020-09-18 MED ORDER — ONDANSETRON HCL 4 MG/2ML IJ SOLN
INTRAMUSCULAR | Status: DC | PRN
  Administered 2020-09-18: 4 mg via INTRAVENOUS

## 2020-09-18 MED ORDER — CEFAZOLIN SODIUM-DEXTROSE 2-4 GM/100ML-% IV SOLN
INTRAVENOUS | Status: AC
  Filled 2020-09-18: qty 100

## 2020-09-18 MED ORDER — LACTATED RINGERS IV SOLN: INTRAVENOUS | Status: DC

## 2020-09-18 MED ORDER — CHLORHEXIDINE GLUCONATE 0.12 % MT SOLN: 15.0000 mL | Freq: Once | OROMUCOSAL | Status: AC

## 2020-09-18 MED ORDER — OXYCODONE HCL 5 MG/5ML PO SOLN: 5.0000 mg | Freq: Once | ORAL | Status: DC | PRN

## 2020-09-18 MED ORDER — SODIUM CHLORIDE 0.9 % IV SOLN: 250.0000 mL | INTRAVENOUS | Status: DC | PRN

## 2020-09-18 MED ORDER — ACETAMINOPHEN 10 MG/ML IV SOLN: 1000.0000 mg | Freq: Once | INTRAVENOUS | Status: DC | PRN

## 2020-09-18 MED ORDER — FENTANYL CITRATE (PF) 100 MCG/2ML IJ SOLN: 25.0000 ug | INTRAMUSCULAR | Status: DC | PRN

## 2020-09-18 MED ORDER — CHLORHEXIDINE GLUCONATE CLOTH 2 % EX PADS: 6.0000 | MEDICATED_PAD | Freq: Once | CUTANEOUS | Status: DC

## 2020-09-18 MED ORDER — LIDOCAINE 2% (20 MG/ML) 5 ML SYRINGE
INTRAMUSCULAR | Status: AC
  Filled 2020-09-18: qty 5

## 2020-09-18 MED ORDER — OXYCODONE HCL 5 MG PO TABS: 5.0000 mg | ORAL_TABLET | Freq: Once | ORAL | Status: DC | PRN

## 2020-09-18 MED ORDER — PHENYLEPHRINE 40 MCG/ML (10ML) SYRINGE FOR IV PUSH (FOR BLOOD PRESSURE SUPPORT)
PREFILLED_SYRINGE | INTRAVENOUS | Status: DC | PRN
  Administered 2020-09-18: 120 ug via INTRAVENOUS
  Administered 2020-09-18 (×2): 80 ug via INTRAVENOUS

## 2020-09-18 MED ORDER — DEXAMETHASONE SODIUM PHOSPHATE 10 MG/ML IJ SOLN
INTRAMUSCULAR | Status: DC | PRN
  Administered 2020-09-18: 4 mg via INTRAVENOUS

## 2020-09-18 MED ORDER — CEFAZOLIN SODIUM-DEXTROSE 2-4 GM/100ML-% IV SOLN
2.0000 g | INTRAVENOUS | Status: AC
  Administered 2020-09-18: 2 g via INTRAVENOUS

## 2020-09-18 MED ORDER — PROMETHAZINE HCL 25 MG/ML IJ SOLN: 6.2500 mg | INTRAMUSCULAR | Status: DC | PRN

## 2020-09-18 MED ORDER — OXYCODONE HCL 5 MG PO TABS: 5.0000 mg | ORAL_TABLET | ORAL | Status: DC | PRN

## 2020-09-18 MED ORDER — TRIAMCINOLONE ACETONIDE 40 MG/ML IJ SUSP
INTRAMUSCULAR | Status: AC
  Filled 2020-09-18: qty 5

## 2020-09-18 MED ORDER — ACETAMINOPHEN 650 MG RE SUPP: 650.0000 mg | RECTAL | Status: DC | PRN

## 2020-09-18 MED ORDER — PROPOFOL 10 MG/ML IV BOLUS
INTRAVENOUS | Status: DC | PRN
  Administered 2020-09-18: 170 mg via INTRAVENOUS

## 2020-09-18 MED ORDER — TRIAMCINOLONE ACETONIDE 40 MG/ML IJ SUSP
INTRAMUSCULAR | Status: DC | PRN
  Administered 2020-09-18: 2 mL

## 2020-09-18 MED ORDER — ROCURONIUM BROMIDE 10 MG/ML (PF) SYRINGE
PREFILLED_SYRINGE | INTRAVENOUS | Status: AC
  Filled 2020-09-18: qty 10

## 2020-09-18 MED ORDER — ONDANSETRON HCL 4 MG/2ML IJ SOLN
INTRAMUSCULAR | Status: AC
  Filled 2020-09-18: qty 2

## 2020-09-18 MED ORDER — SODIUM CHLORIDE 0.9% FLUSH: 3.0000 mL | Freq: Two times a day (BID) | INTRAVENOUS | Status: DC

## 2020-09-18 MED ORDER — 0.9 % SODIUM CHLORIDE (POUR BTL) OPTIME
TOPICAL | Status: DC | PRN
  Administered 2020-09-18: 1000 mL

## 2020-09-18 MED ORDER — CEPHALEXIN 500 MG PO CAPS: 500.0000 mg | ORAL_CAPSULE | Freq: Four times a day (QID) | ORAL | 0 refills | Status: DC

## 2020-09-18 MED ORDER — AMISULPRIDE (ANTIEMETIC) 5 MG/2ML IV SOLN: 10.0000 mg | Freq: Once | INTRAVENOUS | Status: DC | PRN

## 2020-09-18 MED ORDER — ORAL CARE MOUTH RINSE: 15.0000 mL | Freq: Once | OROMUCOSAL | Status: AC

## 2020-09-18 MED ORDER — ACETAMINOPHEN 325 MG PO TABS: 325.0000 mg | ORAL_TABLET | ORAL | Status: DC | PRN

## 2020-09-18 MED ORDER — PROPOFOL 10 MG/ML IV BOLUS
INTRAVENOUS | Status: AC
  Filled 2020-09-18: qty 20

## 2020-09-18 MED ORDER — SODIUM CHLORIDE 0.9% FLUSH: 3.0000 mL | INTRAVENOUS | Status: DC | PRN

## 2020-09-18 MED ORDER — ACETAMINOPHEN 325 MG PO TABS: 650.0000 mg | ORAL_TABLET | ORAL | Status: DC | PRN

## 2020-09-18 MED ORDER — FENTANYL CITRATE (PF) 250 MCG/5ML IJ SOLN
INTRAMUSCULAR | Status: AC
  Filled 2020-09-18: qty 5

## 2020-09-18 MED ORDER — LIDOCAINE-EPINEPHRINE (PF) 1 %-1:200000 IJ SOLN
INTRAMUSCULAR | Status: DC | PRN
  Administered 2020-09-18: 20 mL

## 2020-09-18 MED ORDER — FENTANYL CITRATE (PF) 100 MCG/2ML IJ SOLN
INTRAMUSCULAR | Status: DC | PRN
  Administered 2020-09-18: 50 ug via INTRAVENOUS

## 2020-09-18 MED ORDER — ACETAMINOPHEN 160 MG/5ML PO SOLN: 325.0000 mg | ORAL | Status: DC | PRN

## 2020-09-18 MED ORDER — LIDOCAINE-EPINEPHRINE (PF) 1 %-1:200000 IJ SOLN
INTRAMUSCULAR | Status: AC
  Filled 2020-09-18: qty 30

## 2020-09-18 MED ORDER — LIDOCAINE 2% (20 MG/ML) 5 ML SYRINGE
INTRAMUSCULAR | Status: DC | PRN
  Administered 2020-09-18: 40 mg via INTRAVENOUS

## 2020-09-18 MED ORDER — CHLORHEXIDINE GLUCONATE 0.12 % MT SOLN
OROMUCOSAL | Status: AC
  Administered 2020-09-18: 15 mL via OROMUCOSAL
  Filled 2020-09-18: qty 15

## 2020-09-18 SURGICAL SUPPLY — 40 items
ADH SKN CLS APL DERMABOND .7 (GAUZE/BANDAGES/DRESSINGS) ×1
BAG COUNTER SPONGE SURGICOUNT (BAG) ×2 IMPLANT
BAG SPNG CNTER NS LX DISP (BAG) ×1
BLADE SURG 15 STRL LF DISP TIS (BLADE) ×1 IMPLANT
BLADE SURG 15 STRL SS (BLADE) ×2
CANISTER SUCT 3000ML PPV (MISCELLANEOUS) ×1 IMPLANT
CLSR STERI-STRIP ANTIMIC 1/2X4 (GAUZE/BANDAGES/DRESSINGS) ×1 IMPLANT
COVER SURGICAL LIGHT HANDLE (MISCELLANEOUS) ×1 IMPLANT
DERMABOND ADVANCED (GAUZE/BANDAGES/DRESSINGS) ×1
DERMABOND ADVANCED .7 DNX12 (GAUZE/BANDAGES/DRESSINGS) IMPLANT
DRAPE LAPAROTOMY 100X72 PEDS (DRAPES) ×1 IMPLANT
DRAPE UTILITY 15X26 TOWEL STRL (DRAPES) ×1 IMPLANT
DRSG MEPILEX BORDER 4X8 (GAUZE/BANDAGES/DRESSINGS) ×1 IMPLANT
DRSG TEGADERM 2-3/8X2-3/4 SM (GAUZE/BANDAGES/DRESSINGS) ×1 IMPLANT
ELECT CAUTERY BLADE 6.4 (BLADE) ×1 IMPLANT
ELECT NDL BLADE 2-5/6 (NEEDLE) IMPLANT
ELECT NEEDLE BLADE 2-5/6 (NEEDLE) ×2 IMPLANT
ELECT REM PT RETURN 9FT ADLT (ELECTROSURGICAL) ×2
ELECTRODE REM PT RTRN 9FT ADLT (ELECTROSURGICAL) ×1 IMPLANT
GLOVE SURG ENC MOIS LTX SZ6.5 (GLOVE) ×4 IMPLANT
GLOVE SURG PR MICRO ENCORE 7.5 (GLOVE) ×1 IMPLANT
GOWN STRL REUS W/ TWL LRG LVL3 (GOWN DISPOSABLE) ×3 IMPLANT
GOWN STRL REUS W/TWL LRG LVL3 (GOWN DISPOSABLE) ×6
KIT BASIN OR (CUSTOM PROCEDURE TRAY) ×2 IMPLANT
NDL 18GX1X1/2 (RX/OR ONLY) (NEEDLE) IMPLANT
NDL HYPO 25GX1X1/2 BEV (NEEDLE) ×1 IMPLANT
NEEDLE 18GX1X1/2 (RX/OR ONLY) (NEEDLE) ×2 IMPLANT
NEEDLE HYPO 25GX1X1/2 BEV (NEEDLE) ×4 IMPLANT
NS IRRIG 1000ML POUR BTL (IV SOLUTION) ×2 IMPLANT
PACK SURGICAL SETUP 50X90 (CUSTOM PROCEDURE TRAY) ×2 IMPLANT
PENCIL BUTTON HOLSTER BLD 10FT (ELECTRODE) ×2 IMPLANT
SUT MNCRL AB 3-0 PS2 18 (SUTURE) ×1 IMPLANT
SUT MNCRL AB 4-0 PS2 18 (SUTURE) ×1 IMPLANT
SUT MON AB 5-0 PS2 18 (SUTURE) ×1 IMPLANT
SYR 3ML LL SCALE MARK (SYRINGE) ×1 IMPLANT
SYR BULB EAR ULCER 3OZ GRN STR (SYRINGE) ×2 IMPLANT
SYR CONTROL 10ML LL (SYRINGE) ×2 IMPLANT
TOWEL GREEN STERILE FF (TOWEL DISPOSABLE) ×2 IMPLANT
TUBE CONNECTING 12X1/4 (SUCTIONS) ×2 IMPLANT
YANKAUER SUCT BULB TIP NO VENT (SUCTIONS) ×2 IMPLANT

## 2020-09-18 NOTE — Discharge Instructions (Addendum)
INSTRUCTIONS FOR AFTER SURGERY   You will likely have some questions about what to expect following your operation.  The following information will help you and your family understand what to expect when you are discharged from the hospital.  Following these guidelines will help ensure a smooth recovery and reduce risks of complications.  Postoperative instructions include information on: diet, wound care, medications and physical activity.  AFTER SURGERY Expect to go home after the procedure.  In some cases, you may need to spend one night in the hospital for observation.  DIET This surgery does not require a specific diet.  However, I have to mention that the healthier you eat the better your body can start healing. It is important to increasing your protein intake.  This means limiting the foods with added sugar.  Focus on fruits and vegetables and some meat. It is very important to drink water after your surgery.  If your urine is bright yellow, then it is concentrated, and you need to drink more water.  As a general rule after surgery, you should have 8 ounces of water every hour while awake.  If you find you are persistently nauseated or unable to take in liquids let us know.  NO TOBACCO USE or EXPOSURE.  This will slow your healing process and increase the risk of a wound.  WOUND CARE If you don't have a drain: You can shower the day after surgery.  Use fragrance free soap.  Dial, [place], [place] and Cetaphil are usually mild on the skin.  If you have a drain: Clean with baby wipes for 3-5 days and then you can shower.  If you have a binder you may remove it to shower and then put it back on. If you have steri-strips / tape directly attached to your skin leave them in place. It is OK to get these wet.  No baths, pools or hot tubs for two weeks. We close your incision to leave the smallest and best-looking scar. No ointment or creams on your incisions until given the go ahead.  Especially not  Neosporin (Too many skin reactions with this one).  A few weeks after surgery you can use Mederma and start massaging the scar.  ACTIVITY No heavy lifting until cleared by the doctor.  It is OK to walk and climb stairs. In fact, moving your legs is very important to decrease your risk of a blood clot.  It will also help keep you from getting deconditioned.  Every 1 to 2 hours get up and walk for 5 minutes. This will help with a quicker recovery back to normal.  Let pain be your guide so you don't do too much.    WORK Everyone returns to work at different times. As a rough guide, most people take at least 1 - 2 weeks off prior to returning to work. If you need documentation for your job, bring the forms to your postoperative follow up visit.  DRIVING Arrange for someone to bring you home from the hospital.  You may be able to drive a few days after surgery but not while taking any narcotics or valium.  BOWEL MOVEMENTS Constipation can occur after anesthesia and while taking pain medication.  It is important to stay ahead for your comfort.  We recommend taking Milk of Magnesia (2 tablespoons; twice a day) while taking the pain pills.  SEROMA This is fluid your body tried to put in the surgical site.  This is normal but if  it creates excessive pain and swelling let us know.  It usually decreases in a few weeks.  MEDICATIONS and PAIN CONTROL At your preoperative visit for you history and physical you were given the following medications: An antibiotic: Start this medication when you get home and take according to the instructions on the bottle. Zofran 4 mg:  This is to treat nausea and vomiting.  You can take this every 6 hours as needed and only if needed. Norco (hydrocodone/acetaminophen) 5/325 mg:  This is only to be used after you have taken the motrin or the tylenol. Every 8 hours as needed. Over the counter Medication to take: Ibuprofen (Motrin) 600 mg:  Take this every 6 hours.  If you have  additional pain then take 500 mg of the tylenol.  Only take the Norco after you have tried these two. Miralax or stool softener of choice: Take this according to the bottle if you take the [place] Call your surgeon's office if any of the following occur:  Fever 101 degrees F or greater  Excessive bleeding or fluid from the incision site.  Pain that increases over time without aid from the medications  Redness, warmth, or pus draining from incision sites  Persistent nausea or inability to take in liquids  Severe misshapen area that underwent the operation.

## 2020-09-18 NOTE — Transfer of Care (Signed)
Immediate Anesthesia Transfer of Care Note  Patient: Joanne Morris  Procedure(s) Performed: Excision of right neck keloid (Right: Neck)  Patient Location: PACU  Anesthesia Type:General  Level of Consciousness: drowsy and patient cooperative  Airway & Oxygen Therapy: Patient Spontanous Breathing and Patient connected to nasal cannula oxygen  Post-op Assessment: Report given to RN, Post -op Vital signs reviewed and stable and Patient moving all extremities  Post vital signs: Reviewed and stable  Last Vitals:  Vitals Value Taken Time  BP 137/70 09/18/20 0832  Temp    Pulse 74 09/18/20 0833  Resp 14 09/18/20 0833  SpO2 99 % 09/18/20 0833  Vitals shown include unvalidated device data.  Last Pain:  Vitals:   09/18/20 0629  TempSrc:   PainSc: 0-No pain         Complications: No notable events documented.

## 2020-09-18 NOTE — Interval H&P Note (Signed)
History and Physical Interval Note:  09/18/2020 7:02 AM  Joanne Morris  has presented today for surgery, with the diagnosis of keloid scar.  The various methods of treatment have been discussed with the patient and family. After consideration of risks, benefits and other options for treatment, the patient has consented to  Procedure(s): Excision of right neck keloid (Right) as a surgical intervention.  The patient's history has been reviewed, patient examined, no change in status, stable for surgery.  I have reviewed the patient's chart and labs.  Questions were answered to the patient's satisfaction.     Loel Lofty Tiki Tucciarone

## 2020-09-18 NOTE — Anesthesia Procedure Notes (Signed)
Procedure Name: LMA Insertion Date/Time: 09/18/2020 7:38 AM Performed by: Leonor Liv, CRNA Pre-anesthesia Checklist: Patient identified, Emergency Drugs available, Suction available and Patient being monitored Patient Re-evaluated:Patient Re-evaluated prior to induction Oxygen Delivery Method: Circle System Utilized Preoxygenation: Pre-oxygenation with 100% oxygen Induction Type: IV induction Ventilation: Mask ventilation without difficulty LMA: LMA inserted LMA Size: 4.0 Number of attempts: 1 Airway Equipment and Method: Bite block Placement Confirmation: positive ETCO2 Tube secured with: Tape Dental Injury: Teeth and Oropharynx as per pre-operative assessment

## 2020-09-18 NOTE — Op Note (Signed)
DATE OF OPERATION: 09/18/2020  LOCATION: [facility]  PREOPERATIVE DIAGNOSIS: Right neck keloid  POSTOPERATIVE DIAGNOSIS: Same  PROCEDURE: Excision of right neck keloid 2 x 8 cm  SURGEON: Macayla Ekdahl Sanger Ileene Allie, DO  ASSISTANT: Roetta Sessions, PA  EBL: 1 cc  CONDITION: Stable  COMPLICATIONS: None  INDICATION: The patient, Joanne Morris, is a 70 y.o. female born on 02/27/1950, is here for treatment of a recurrent right neck keloid that was painful.   PROCEDURE DETAILS:  The patient was seen prior to surgery and marked.  The IV antibiotics were given. The patient was taken to the operating room and given a general anesthetic. A standard time out was performed and all information was confirmed by those in the room. SCDs were placed.   The patient was prepped and draped.  The area was marked with a marking pen.  Local was injected.  The #15 blade was used to excise the 2 x 8 cm keloid.  Hemostasis was achieved with electrocautery.  The deep layer was closed with the 3-0 Monocryl followed by 4- 0 and 5-0 Monocryl.  The site was then injected with the kenalog 40 mg with 2 cc.  Derma bond and steri strips were applied.  The patient was allowed to wake up and taken to recovery room in stable condition at the end of the case. The family was notified at the end of the case.   The advanced practice practitioner (APP) assisted throughout the case.  The APP was essential in retraction and counter traction when needed to make the case progress smoothly.  This retraction and assistance made it possible to see the tissue plans for the procedure.  The assistance was needed for blood control, tissue re-approximation and assisted with closure of the incision site.

## 2020-09-19 ENCOUNTER — Encounter (HOSPITAL_COMMUNITY): Payer: Self-pay | Admitting: Plastic Surgery

## 2020-09-19 NOTE — Anesthesia Postprocedure Evaluation (Signed)
Anesthesia Post Note  Patient: Joanne Morris  Procedure(s) Performed: Excision of right neck keloid (Right: Neck)     Patient location during evaluation: PACU Anesthesia Type: General Level of consciousness: awake and alert Pain management: pain level controlled Vital Signs Assessment: post-procedure vital signs reviewed and stable Respiratory status: spontaneous breathing, nonlabored ventilation, respiratory function stable and patient connected to nasal cannula oxygen Cardiovascular status: blood pressure returned to baseline and stable Postop Assessment: no apparent nausea or vomiting Anesthetic complications: no   No notable events documented.  Last Vitals:  Vitals:   09/18/20 0830 09/18/20 0900  BP:  (!) 174/85  Pulse: 75 73  Resp: 11 15  Temp: (!) 36.1 C (!) 36.1 C  SpO2: 99% 94%              Effie Berkshire

## 2020-09-26 ENCOUNTER — Encounter: Payer: Medicare HMO | Admitting: Plastic Surgery

## 2020-09-27 DIAGNOSIS — Z9181 History of falling: Secondary | ICD-10-CM | POA: Diagnosis not present

## 2020-09-27 DIAGNOSIS — I10 Essential (primary) hypertension: Secondary | ICD-10-CM | POA: Diagnosis not present

## 2020-09-27 DIAGNOSIS — I739 Peripheral vascular disease, unspecified: Secondary | ICD-10-CM | POA: Diagnosis not present

## 2020-09-27 DIAGNOSIS — I700 Atherosclerosis of aorta: Secondary | ICD-10-CM | POA: Diagnosis not present

## 2020-09-27 DIAGNOSIS — E559 Vitamin D deficiency, unspecified: Secondary | ICD-10-CM | POA: Diagnosis not present

## 2020-09-27 DIAGNOSIS — N183 Chronic kidney disease, stage 3 unspecified: Secondary | ICD-10-CM | POA: Diagnosis not present

## 2020-09-27 DIAGNOSIS — E785 Hyperlipidemia, unspecified: Secondary | ICD-10-CM | POA: Diagnosis not present

## 2020-09-27 DIAGNOSIS — D509 Iron deficiency anemia, unspecified: Secondary | ICD-10-CM | POA: Diagnosis not present

## 2020-09-27 DIAGNOSIS — E1169 Type 2 diabetes mellitus with other specified complication: Secondary | ICD-10-CM | POA: Diagnosis not present

## 2020-09-27 DIAGNOSIS — G4700 Insomnia, unspecified: Secondary | ICD-10-CM | POA: Diagnosis not present

## 2020-09-27 DIAGNOSIS — R109 Unspecified abdominal pain: Secondary | ICD-10-CM | POA: Diagnosis not present

## 2020-10-03 ENCOUNTER — Other Ambulatory Visit: Payer: Self-pay

## 2020-10-03 ENCOUNTER — Ambulatory Visit (INDEPENDENT_AMBULATORY_CARE_PROVIDER_SITE_OTHER): Payer: Medicare Other | Admitting: Physician Assistant

## 2020-10-03 ENCOUNTER — Encounter: Payer: Self-pay | Admitting: Physician Assistant

## 2020-10-03 DIAGNOSIS — L910 Hypertrophic scar: Secondary | ICD-10-CM

## 2020-10-03 NOTE — Progress Notes (Addendum)
Patient is a 70 year old female with PMH of neck keloids secondary to carotid surgery performed in 2016 s/p keloid excision in 2018 who presents to the clinic for initial postop visit for excision of right neck keloid performed 09/18/2020 by Dr. [name].  On my examination, patient is doing well.  She denies any fevers, chills, redness, or pain symptoms.  She states that her pain has entirely resolved.  She had not been bathing or showering and was waiting today's encounter before starting.  One of her Steri-Strips was falling off, so it was removed.  There is another one still well attached superiorly that we will allowed to remain in place.  I told her that if it comes off in the shower, that is fine.  She denies any itching and has not been picking at the wounds.  Her incision is well-appearing.  There is no dehiscence.  No surrounding erythema, induration, purulent discharge, or other findings concerning for infection.  The area is nontender to palpation.  At this time, she can apply Vaseline to the area to keep it moist and help facilitate continued healing.  She can shower.  She already has an appointment scheduled with [place] in a couple of weeks.  She was brought here by her niece who she states will likely be available for that appointment as well.  She is without any questions or concerns.  She is happy that we would not do steroid injections today.  We can discuss that at her next appointment.

## 2020-10-17 ENCOUNTER — Encounter: Payer: Self-pay | Admitting: Physician Assistant

## 2020-10-17 NOTE — Progress Notes (Deleted)
Patient is a 70 year old female with PMH of neck keloids secondary to carotid surgery performed in 2016 s/p keloid excision in 2018 who presents to the clinic for initial postop visit for excision of right neck keloid performed 09/18/2020 by Dr. [name].  Patient was last seen here in clinic on 10/03/2020.  At that time, patient was doing well and denied any systemic symptoms or concern for poor wound healing.  She was recommended to apply Vaseline to incision site to help keep the area moist and conducive to continued wound healing.  Steroid injections***?

## 2020-10-18 NOTE — Progress Notes (Signed)
Patient is a 70 year old female with PMH of neck keloids secondary to carotid surgery performed in 2016 s/p keloid excision in 2018 who presents to the clinic for initial postop visit for excision of right neck keloid performed 09/18/2020 by Dr. [name].   Patient was last seen here in clinic on 10/03/2020.  At that time, patient was doing well and denied any systemic symptoms or concern for poor wound healing.  She was recommended to apply Vaseline to incision site to help keep the area moist and conducive to continued wound healing.   On my examination today, patient was initially anxious about steroid injection, but given that she has had keloid recurrences in the past, would like to proceed with the injection.  She denies any redness, swelling, fevers or chills, or any other concerning symptoms at home.  Her physical exam is reassuring without evidence of dehiscence or cellulitic changes.    Injected her with 0.2 cc lidocaine and 0.4 cc Kenalog along her incision line.  Patient tolerated procedure without difficulty.  She will apply [facility] along incision daily and follow-up with [place] here in clinic in 4 weeks for repeat steroid injection.  Any pictures obtained of the patient and placed in the chart were with the patient's or guardian's permission.

## 2020-10-20 ENCOUNTER — Encounter: Payer: Medicare HMO | Admitting: Surgical

## 2020-10-20 ENCOUNTER — Other Ambulatory Visit: Payer: Self-pay

## 2020-10-20 ENCOUNTER — Ambulatory Visit (INDEPENDENT_AMBULATORY_CARE_PROVIDER_SITE_OTHER): Payer: Medicare Other | Admitting: Physician Assistant

## 2020-10-20 DIAGNOSIS — L910 Hypertrophic scar: Secondary | ICD-10-CM

## 2020-10-20 DIAGNOSIS — Z719 Counseling, unspecified: Secondary | ICD-10-CM

## 2020-11-09 NOTE — Progress Notes (Deleted)
Patient is 70 year old female with PMH of neck keloids secondary to carotid surgery performed in 2016.  She has had repeat keloid excisions, most recently 09/18/2020 by Dr. [name].   She was last seen here in clinic on 10/20/2020.  At that time, she was injected with 0.4 cc Kenalog along with 0.2 cc lidocaine along keloid incision line.  Plan was for daily Skinuva and follow-up in clinic for repeat steroid injection.   Any pictures obtained of the patient and placed in the chart were with the patient's or guardian's permission.

## 2020-11-14 ENCOUNTER — Ambulatory Visit: Payer: Medicaid Other | Admitting: Physician Assistant

## 2020-11-24 ENCOUNTER — Telehealth: Payer: Self-pay

## 2020-11-24 NOTE — Telephone Encounter (Signed)
Error

## 2020-11-28 ENCOUNTER — Ambulatory Visit: Payer: Medicaid Other | Admitting: Physician Assistant

## 2020-12-04 NOTE — Progress Notes (Signed)
Referring Provider [name], NP [address],  [place] [address]   CC:  Chief Complaint  Patient presents with   Pre-op Exam      Joanne Morris is an 70 y.o. female.  HPI:  Patient is a 70 year old female with PMH of neck keloids secondary to carotid surgery performed in 2016 s/p keloid excision in 2018 who presents to the clinic for initial postop visit for excision of right neck keloid performed 09/18/2020 by Dr. [name].  Patient was last seen here in the clinic on 10/20/2020.  She was injected with 0.4 cc Kenalog mixed with 0.2 cc lidocaine along the incision line.  Patient tolerated procedure without difficulty.  Plan was for daily application of Skinuva and follow-up for repeat steroid injection in 4 weeks.  Today, patient reports that she is doing well.  She returns for repeat injection.  She states that she has been applying the [place], as directed.  She denies any significant worsening hypertrophy and regards to recent keloid excision 09/2020.  No Known Allergies  Outpatient Encounter Medications as of 12/05/2020  Medication Sig   ALPRAZolam (XANAX) 0.25 MG tablet Take 1 tablet (0.25 mg total) by mouth daily as needed for anxiety.   amLODipine (NORVASC) 10 MG tablet Take 10 mg by mouth daily.   aspirin EC 81 MG tablet Take 81 mg by mouth daily.   CALCIUM-VITAMIN D PO Take 1 tablet by mouth daily.   Cholecalciferol (VITAMIN D3 PO) Take 1 capsule by mouth daily.   cilostazol (PLETAL) 100 MG tablet Take 1 tablet (100 mg total) by mouth 2 (two) times daily before a meal.   cilostazol (PLETAL) 100 MG tablet Take 1 tablet (100 mg total) by mouth 2 (two) times daily before a meal.   FARXIGA 5 MG TABS tablet Take 5 mg by mouth in the morning.   ferrous sulfate 325 (65 FE) MG EC tablet Take 325 mg by mouth daily with breakfast.   glipiZIDE (GLUCOTROL XL) 5 MG 24 hr tablet Take 5 mg by mouth in the morning.   hydrALAZINE (APRESOLINE) 50 MG tablet Take 50 mg by  mouth 3 (three) times daily.   HYDROcodone-acetaminophen (NORCO) 5-325 MG tablet Take 1-2 tablets by mouth every 6 (six) hours as needed for moderate pain or severe pain.   levocetirizine (XYZAL) 5 MG tablet Take 5 mg by mouth daily as needed for allergies.   metoprolol (LOPRESSOR) 100 MG tablet Take 100 mg by mouth 2 (two) times daily.   niacin (NIASPAN) 500 MG CR tablet Take 500 mg by mouth daily.   omeprazole (PRILOSEC) 20 MG capsule Take 20 mg by mouth daily as needed (acid reflux).   ondansetron (ZOFRAN) 4 MG tablet Take 1 tablet (4 mg total) by mouth every 8 (eight) hours as needed for nausea or vomiting.   polyethylene glycol powder (GLYCOLAX/MIRALAX) 17 GM/SCOOP powder Take 17 g by mouth daily as needed for constipation.   pravastatin (PRAVACHOL) 40 MG tablet Take 40 mg by mouth daily.   sertraline (ZOLOFT) 50 MG tablet Take 50 mg by mouth every evening.   tiZANidine (ZANAFLEX) 4 MG tablet Take 4 mg by mouth at bedtime as needed for muscle spasms.   valsartan (DIOVAN) 320 MG tablet Take 320 mg by mouth daily.   Vitamin D, Ergocalciferol, (DRISDOL) 1.25 MG (50000 UNIT) CAPS capsule Take 50,000 Units by mouth every 7 (seven) days.   zolpidem (AMBIEN CR) 12.5 MG CR tablet Take 12.5 mg by mouth at  bedtime.   zolpidem (AMBIEN) 10 MG tablet Take 0.5 tablets (5 mg total) by mouth at bedtime.   No facility-administered encounter medications on file as of 12/05/2020.     Past Medical History:  Diagnosis Date   Angina    Anxiety    Arthritis    "knees" (07/20/2013)   Carotid stenosis    Carotid stenosis    Claudication (HCC)    Coronary artery disease    Depression    GERD (gastroesophageal reflux disease)    High cholesterol    Hypertension    Myocardial infarction [facility]) 1990's   "1"   PAD (peripheral artery disease) ([place])    Stroke ([place])    "mini stroke 1st then regular stroke", denies residual on 07/20/2013   Type II diabetes mellitus ([place])    Wears glasses     Past Surgical  History:  Procedure Laterality Date   ABDOMINAL HYSTERECTOMY     ATHERECTOMY  10/05/2013   BALLOON ANGIOPLASTY, ARTERY  10/05/2013   DR [name]   CAROTID ENDARTERECTOMY     CORONARY ANGIOPLASTY WITH STENT PLACEMENT     "1"   ENDARTERECTOMY Left 02/07/2014   Procedure: ENDARTERECTOMY CAROTID;  Surgeon: Elam Dutch, MD;  Location: [place];  Service: Vascular;  Laterality: Left;   ENDARTERECTOMY Right 04/18/2014   Procedure: ENDARTERECTOMY RIGHT CAROTID;  Surgeon: Elam Dutch, MD;  Location: [place];  Service: Vascular;  Laterality: Right;   ENDARTERECTOMY N/A 04/24/2014   Procedure: IRRIGATION AND DEBRIDEMENT OF RIGHT NECK ;  Surgeon: Elam Dutch, MD;  Location: [place];  Service: Vascular;  Laterality: N/A;   ESOPHAGOGASTRODUODENOSCOPY N/A 10/13/2012   Procedure: ESOPHAGOGASTRODUODENOSCOPY (EGD);  Surgeon: Gatha Mayer, MD;  Location: [facility] ENDOSCOPY;  Service: Endoscopy;  Laterality: N/A;   KENALOG INJECTION Bilateral 08/22/2016   Procedure: KENALOG INJECTION BILATERAL NECK;  Surgeon: Wallace Going, DO;  Location: [place];  Service: Plastics;  Laterality: Bilateral;   LOWER EXTREMITY ANGIOGRAM  07/20/2013   Unsuccessful attempt at crossing the CTO/notes 07/20/2013   LOWER EXTREMITY ANGIOGRAM N/A 07/06/2013   Procedure: LOWER EXTREMITY ANGIOGRAM;  Surgeon: Laverda Page, MD;  Location: [facility] CATH LAB;  Service: Cardiovascular;  Laterality: N/A;   LOWER EXTREMITY ANGIOGRAM N/A 07/20/2013   Procedure: LOWER EXTREMITY ANGIOGRAM;  Surgeon: Laverda Page, MD;  Location: [facility] CATH LAB;  Service: Cardiovascular;  Laterality: N/A;   LOWER EXTREMITY ANGIOGRAM N/A 10/05/2013   Procedure: LOWER EXTREMITY ANGIOGRAM;  Surgeon: Laverda Page, MD;  Location: [facility] CATH LAB;  Service: Cardiovascular;  Laterality: N/A;   MASS EXCISION Right 08/22/2016   Procedure: EXCISION RIGHT NECK KELOID;  Surgeon: Wallace Going, DO;  Location: [place];  Service: Plastics;   Laterality: Right;   MASS EXCISION Right 09/18/2020   Procedure: Excision of right neck keloid;  Surgeon: Wallace Going, DO;  Location: [place];  Service: Plastics;  Laterality: Right;   PATCH ANGIOPLASTY Left 02/07/2014   Procedure: PATCH ANGIOPLASTY Carotid;  Surgeon: Elam Dutch, MD;  Location: [place];  Service: Vascular;  Laterality: Left;   PATCH ANGIOPLASTY Right 04/18/2014   Procedure: PATCH ANGIOPLASTY USING HEMASHIELD 0.8cmx 7.6cm PATCH;  Surgeon: Elam Dutch, MD;  Location: [place];  Service: Vascular;  Laterality: Right;   PERIPHERAL VASCULAR CATHETERIZATION N/A 07/05/2014   Procedure: Lower Extremity Angiography;  Surgeon: [name], MD;  Location: [place] [facility] LAB;  Service: Cardiovascular;  Laterality: N/A;   TUBAL LIGATION  No family history on file.  Social History   Social History Narrative   Not on file     Review of Systems General: Denies fevers or chills Skin: Denies changes  Physical Exam Vitals with BMI 09/18/2020 09/18/2020 09/18/2020  Height - - 5\' 1"   Weight - - 140 lbs  BMI - - 08.14  Systolic 481 - 856  Diastolic 85 - 84  Pulse 73 75 88    General:  No acute distress, nontoxic appearing  Respiratory: No increased work of breathing Neuro: Alert and oriented Psychiatric: Normal mood and affect  Skin: Scar from keloid excision involving right side of neck.  No signs concerning for infection.  She picture.  Assessment/Plan  On exam, her scar does appear to have mildly hypertrophied compared to most recent encounter.  She was once again injected with 0.4 cc Kenalog mixed with 0.2 cc lidocaine along the superior aspect of her excision site where she is again hypertrophying.  We also discussed Strataderm as an option for her keloid scarring.  Alverda Skeans is the same but can often be covered by insurance and would be a slightly more affordable option for patients.    Asked that she return in approximately 2 months for additional Kenalog injection  as well as discussion of stopping [place] and transitioning to [employer] or Stratatriz ointment for continued scar management.  Picture(s) obtained of the patient and placed in the chart were with the patient's or guardian's permission.  Krista Blue 12/05/2020, 12:39 PM

## 2020-12-05 ENCOUNTER — Telehealth: Payer: Self-pay

## 2020-12-05 ENCOUNTER — Other Ambulatory Visit: Payer: Self-pay

## 2020-12-05 ENCOUNTER — Ambulatory Visit (INDEPENDENT_AMBULATORY_CARE_PROVIDER_SITE_OTHER): Payer: Medicare Other | Admitting: Physician Assistant

## 2020-12-05 DIAGNOSIS — L910 Hypertrophic scar: Secondary | ICD-10-CM | POA: Diagnosis not present

## 2020-12-05 NOTE — Telephone Encounter (Signed)
Faxed completed signed form of Medicaid Transportation to [facility]

## 2020-12-18 ENCOUNTER — Telehealth: Payer: Self-pay

## 2020-12-18 NOTE — Telephone Encounter (Signed)
  Encourage patient to contact the pharmacy for refills or they can request refills through [place]:   NEXT APPOINTMENT DATE: 12/26/20 - re-establishing with [place]   MEDICATION: zolpidem (AMBIEN CR) 12.5 MG CR tablet  Is the patient out of medication? no  PHARMACY:[id number] - [address], [place]  Let patient know to contact pharmacy at the end of the day to make sure medication is ready.  Please notify patient to allow 48-72 hours to process

## 2020-12-18 NOTE — Telephone Encounter (Signed)
Pt has not been seen in [facility], this request can be addressed at her appointment 12/26/2020.

## 2020-12-19 NOTE — Telephone Encounter (Signed)
LVM asking patient to move up the appointment.

## 2020-12-26 ENCOUNTER — Other Ambulatory Visit: Payer: Self-pay

## 2020-12-26 ENCOUNTER — Encounter: Payer: Self-pay | Admitting: Family

## 2020-12-26 ENCOUNTER — Ambulatory Visit (INDEPENDENT_AMBULATORY_CARE_PROVIDER_SITE_OTHER): Payer: Medicare Other | Admitting: Family

## 2020-12-26 VITALS — BP 136/77 | HR 87 | Temp 97.7°F | Ht 61.0 in | Wt 149.4 lb

## 2020-12-26 DIAGNOSIS — G4700 Insomnia, unspecified: Secondary | ICD-10-CM

## 2020-12-26 DIAGNOSIS — G894 Chronic pain syndrome: Secondary | ICD-10-CM

## 2020-12-26 DIAGNOSIS — Z23 Encounter for immunization: Secondary | ICD-10-CM | POA: Diagnosis not present

## 2020-12-26 MED ORDER — HYDROCODONE-ACETAMINOPHEN 5-325 MG PO TABS: 1.0000 | ORAL_TABLET | Freq: Every day | ORAL | 0 refills | Status: DC | PRN

## 2020-12-26 MED ORDER — ZOLPIDEM TARTRATE ER 12.5 MG PO TBCR: 12.5000 mg | EXTENDED_RELEASE_TABLET | Freq: Every day | ORAL | 2 refills | Status: DC

## 2020-12-26 NOTE — Patient Instructions (Addendum)
Welcome to [facility]! It was a pleasure seeing you today.  As discussed, Please schedule a 2 month follow up visit today for refills and fasting labs.  PLEASE NOTE:  If you had any LAB tests please let us know if you have not heard back within a few days. You may see your results on MyChart before we have a chance to review them but we will give you a call once they are reviewed by [place]. If we ordered any REFERRALS today, please let us know if you have not heard from their office within the next week.  Let us know through MyChart if you are needing REFILLS, or have your pharmacy send [place] the request. You can also use MyChart to communicate with me or any office staff.  Please try these tips to maintain a healthy lifestyle:  Eat most of your calories during the day when you are active. Eliminate processed foods including packaged sweets (pies, cakes, cookies), reduce intake of potatoes, white bread, white pasta, and white rice. Look for whole grain options, oat flour or almond flour.  Each meal should contain half fruits/vegetables, one quarter protein, and one quarter carbs (no bigger than a computer mouse).  Cut down on sweet beverages. This includes juice, soda, and sweet tea. Also watch fruit intake, though this is a healthier sweet option, it still contains natural sugar! Limit to 3 servings daily.  Drink at least 1 glass of water with each meal and aim for at least 8 glasses per day  Exercise at least 150 minutes every week.

## 2020-12-26 NOTE — Assessment & Plan Note (Addendum)
Right side, has had previous workup with nothing abnormal found. States the Norco gets rid of  the pain, 30 pills last almost 2 months. PDMP reviewed, narcotic substance contract signed and scanned.

## 2020-12-26 NOTE — Progress Notes (Signed)
Subjective:     Patient ID: Joanne Morris, female    DOB: 1950/12/16, 70 y.o.   MRN: [id number]  Chief Complaint  Patient presents with   Establish Care   Medication Refill    She is requesting Ambien and Norco.    HPI Pain She reports chronic right side pain. was not an injury that may have caused the pain. The pain started about a year ago and is staying constant. The pain does not radiate. The pain is described as aching and soreness, is moderate in intensity, occurring intermittently. Symptoms are worse in the: evening  Aggravating factors: standing and walking She has tried application of heat, application of ice, NSAIDs, and prescription pain relievers with moderate relief.   INSOMNIA:  How long: years. Difficulty initiating sleep: yes.  Difficulty maintaining sleep: yes.  OTC meds tried: Benadryl, Melatonin. RX meds in past: Trazodone. Sleep hygiene measures: yes. Started any new meds recently: no. Shift worker: no. New stressors: no.   Health Maintenance Due  Topic Date Due   FOOT EXAM  Never done   OPHTHALMOLOGY EXAM  Never done   Hepatitis C Screening  Never done   TETANUS/TDAP  Never done   Zoster Vaccines- Shingrix (1 of 2) Never done   COLONOSCOPY (Pts 45-50yrs Insurance coverage will need to be confirmed)  Never done   MAMMOGRAM  03/31/2014   Pneumonia Vaccine 30+ Years old (2 - PCV) 07/22/2014   DEXA SCAN  Never done   HEMOGLOBIN A1C  01/31/2019   COVID-19 Vaccine (3 - Moderna risk series) 05/01/2020    Past Medical History:  Diagnosis Date   Angina    Anxiety    Arthritis    "knees" (07/20/2013)   Claudication ([place])    Depression    GERD (gastroesophageal reflux disease)    High cholesterol    Myocardial infarction ([place]) 1990's   "1"   PAD (peripheral artery disease) ([place])    Stroke ([place])    "mini stroke 1st then regular stroke", denies residual on 07/20/2013   Wears glasses     Past Surgical History:  Procedure Laterality Date   ABDOMINAL  HYSTERECTOMY     ATHERECTOMY  10/05/2013   BALLOON ANGIOPLASTY, ARTERY  10/05/2013   DR [name]   CAROTID ENDARTERECTOMY     CORONARY ANGIOPLASTY WITH STENT PLACEMENT     "1"   ENDARTERECTOMY Left 02/07/2014   Procedure: ENDARTERECTOMY CAROTID;  Surgeon: Elam Dutch, MD;  Location: [place];  Service: Vascular;  Laterality: Left;   ENDARTERECTOMY Right 04/18/2014   Procedure: ENDARTERECTOMY RIGHT CAROTID;  Surgeon: Elam Dutch, MD;  Location: [place];  Service: Vascular;  Laterality: Right;   ENDARTERECTOMY N/A 04/24/2014   Procedure: IRRIGATION AND DEBRIDEMENT OF RIGHT NECK ;  Surgeon: Elam Dutch, MD;  Location: [place];  Service: Vascular;  Laterality: N/A;   ESOPHAGOGASTRODUODENOSCOPY N/A 10/13/2012   Procedure: ESOPHAGOGASTRODUODENOSCOPY (EGD);  Surgeon: Gatha Mayer, MD;  Location: [facility] ENDOSCOPY;  Service: Endoscopy;  Laterality: N/A;   KENALOG INJECTION Bilateral 08/22/2016   Procedure: KENALOG INJECTION BILATERAL NECK;  Surgeon: Wallace Going, DO;  Location: [place];  Service: Plastics;  Laterality: Bilateral;   LOWER EXTREMITY ANGIOGRAM  07/20/2013   Unsuccessful attempt at crossing the CTO/notes 07/20/2013   LOWER EXTREMITY ANGIOGRAM N/A 07/06/2013   Procedure: LOWER EXTREMITY ANGIOGRAM;  Surgeon: Laverda Page, MD;  Location: [facility] CATH LAB;  Service: Cardiovascular;  Laterality: N/A;   LOWER EXTREMITY ANGIOGRAM N/A 07/20/2013  Procedure: LOWER EXTREMITY ANGIOGRAM;  Surgeon: Laverda Page, MD;  Location: [facility] CATH LAB;  Service: Cardiovascular;  Laterality: N/A;   LOWER EXTREMITY ANGIOGRAM N/A 10/05/2013   Procedure: LOWER EXTREMITY ANGIOGRAM;  Surgeon: Laverda Page, MD;  Location: [facility] CATH LAB;  Service: Cardiovascular;  Laterality: N/A;   MASS EXCISION Right 08/22/2016   Procedure: EXCISION RIGHT NECK KELOID;  Surgeon: Wallace Going, DO;  Location: [place];  Service: Plastics;  Laterality: Right;   MASS EXCISION Right 09/18/2020    Procedure: Excision of right neck keloid;  Surgeon: Wallace Going, DO;  Location: [place];  Service: Plastics;  Laterality: Right;   PATCH ANGIOPLASTY Left 02/07/2014   Procedure: PATCH ANGIOPLASTY Carotid;  Surgeon: Elam Dutch, MD;  Location: [place];  Service: Vascular;  Laterality: Left;   PATCH ANGIOPLASTY Right 04/18/2014   Procedure: PATCH ANGIOPLASTY USING HEMASHIELD 0.8cmx 7.6cm PATCH;  Surgeon: Elam Dutch, MD;  Location: [place];  Service: Vascular;  Laterality: Right;   PERIPHERAL VASCULAR CATHETERIZATION N/A 07/05/2014   Procedure: Lower Extremity Angiography;  Surgeon: [name], MD;  Location: [place] CV LAB;  Service: Cardiovascular;  Laterality: N/A;   TUBAL LIGATION      Outpatient Medications Prior to Visit  Medication Sig Dispense Refill   amLODipine (NORVASC) 10 MG tablet Take 10 mg by mouth daily.  5   aspirin EC 81 MG tablet Take 81 mg by mouth daily.     CALCIUM-VITAMIN D PO Take 1 tablet by mouth daily.     Cholecalciferol (VITAMIN D3 PO) Take 1 capsule by mouth daily.     cilostazol (PLETAL) 100 MG tablet Take 1 tablet (100 mg total) by mouth 2 (two) times daily before a meal. 60 tablet 11   FARXIGA 5 MG TABS tablet Take 5 mg by mouth in the morning.     ferrous sulfate 325 (65 FE) MG EC tablet Take 325 mg by mouth daily with breakfast.     glipiZIDE (GLUCOTROL XL) 5 MG 24 hr tablet Take 5 mg by mouth in the morning.     hydrALAZINE (APRESOLINE) 50 MG tablet Take 50 mg by mouth 3 (three) times daily.     levocetirizine (XYZAL) 5 MG tablet Take 5 mg by mouth daily as needed for allergies.     metoprolol (LOPRESSOR) 100 MG tablet Take 100 mg by mouth 2 (two) times daily.     niacin (NIASPAN) 500 MG CR tablet Take 500 mg by mouth daily.  6   omeprazole (PRILOSEC) 20 MG capsule Take 20 mg by mouth daily as needed (acid reflux).     ondansetron (ZOFRAN) 4 MG tablet Take 1 tablet (4 mg total) by mouth every 8 (eight) hours as needed for nausea or vomiting. 20  tablet 0   polyethylene glycol powder (GLYCOLAX/MIRALAX) 17 GM/SCOOP powder Take 17 g by mouth daily as needed for constipation.     pravastatin (PRAVACHOL) 40 MG tablet Take 40 mg by mouth daily.     sertraline (ZOLOFT) 50 MG tablet Take 50 mg by mouth every evening.     tiZANidine (ZANAFLEX) 4 MG tablet Take 4 mg by mouth at bedtime as needed for muscle spasms.  2   valsartan (DIOVAN) 320 MG tablet Take 320 mg by mouth daily.     Vitamin D, Ergocalciferol, (DRISDOL) 1.25 MG (50000 UNIT) CAPS capsule Take 50,000 Units by mouth every 7 (seven) days.     ALPRAZolam (XANAX) 0.25 MG tablet Take 1  tablet (0.25 mg total) by mouth daily as needed for anxiety. 3 tablet 0   cilostazol (PLETAL) 100 MG tablet Take 1 tablet (100 mg total) by mouth 2 (two) times daily before a meal. 60 tablet 11   HYDROcodone-acetaminophen (NORCO) 5-325 MG tablet Take 1-2 tablets by mouth every 6 (six) hours as needed for moderate pain or severe pain. 12 tablet 0   zolpidem (AMBIEN CR) 12.5 MG CR tablet Take 12.5 mg by mouth at bedtime.     zolpidem (AMBIEN) 10 MG tablet Take 0.5 tablets (5 mg total) by mouth at bedtime.     No facility-administered medications prior to visit.    No Known Allergies      Objective:    Physical Exam Vitals and nursing note reviewed.  Constitutional:      Appearance: Normal appearance.  Cardiovascular:     Rate and Rhythm: Normal rate and regular rhythm.  Pulmonary:     Effort: Pulmonary effort is normal.     Breath sounds: Normal breath sounds.  Musculoskeletal:        General: Normal range of motion.  Skin:    General: Skin is warm and dry.  Neurological:     Mental Status: She is alert.  Psychiatric:        Mood and Affect: Mood normal.        Behavior: Behavior normal.    BP 136/77   Pulse 87   Temp 97.7 F (36.5 C) (Temporal)   Ht 5\' 1"  (1.549 m) Comment: with shoes  Wt 149 lb 6.4 oz (67.8 kg)   SpO2 100%   BMI 28.23 kg/m  Wt Readings from Last 3 Encounters:   12/26/20 149 lb 6.4 oz (67.8 kg)  09/18/20 140 lb (63.5 kg)  08/30/20 142 lb 4.8 oz (64.5 kg)       Assessment & Plan:   Problem List Items Addressed This Visit       Other   Chronic pain syndrome    Right side, has had previous workup with nothing abnormal found. States the Norco gets rid of  the pain, 30 pills last almost 2 months. PDMP reviewed, narcotic substance contract signed and scanned.       Relevant Medications   HYDROcodone-acetaminophen (NORCO/VICODIN) 5-325 MG tablet   Insomnia - Primary   Relevant Medications   zolpidem (AMBIEN CR) 12.5 MG CR tablet   Need for immunization against influenza   Relevant Orders   Flu Vaccine QUAD High Dose(Fluad) (Completed)   Time spent with patient today was 30 minutes which consisted of chart review, discussing diagnoses, work up, treatment, answering questions, and documentation.   Meds ordered this encounter  Medications   DISCONTD: HYDROcodone-acetaminophen (NORCO) 5-325 MG tablet    Sig: Take 1 tablet by mouth daily as needed for moderate pain.    Dispense:  30 tablet    Refill:  0    Order Specific Question:   Supervising Provider    Answer:   ANDY, CAMILLE L [[id number]]   HYDROcodone-acetaminophen (NORCO/VICODIN) 5-325 MG tablet    Sig: Take 1 tablet by mouth daily as needed for moderate pain.    Dispense:  30 tablet    Refill:  0    Order Specific Question:   Supervising Provider    Answer:   ANDY, CAMILLE L [[id number]]   zolpidem (AMBIEN CR) 12.5 MG CR tablet    Sig: Take 1 tablet (12.5 mg total) by mouth at bedtime.    Dispense:  30  tablet    Refill:  2    Order Specific Question:   Supervising Provider    Answer:   ANDY, CAMILLE L [[id number]]

## 2020-12-29 LAB — BASIC METABOLIC PANEL
BUN: 22 — AB (ref 4–21)
CO2: 20 (ref 13–22)
Chloride: 103 (ref 99–108)
Creatinine: 1.3 — AB (ref <=1.1)
Glucose: 189
Potassium: 4.5 (ref 3.4–5.3)
Sodium: 141 (ref 137–147)

## 2020-12-29 LAB — LIPID PANEL
Cholesterol: 182 (ref 0–200)
HDL: 69 (ref 35–70)
LDL Cholesterol: 88
LDl/HDL Ratio: 1.3
Triglycerides: 149 (ref 40–160)

## 2020-12-29 LAB — HEPATIC FUNCTION PANEL
ALT: 19 (ref 7–35)
AST: 21 (ref 13–35)
Alkaline Phosphatase: 111 (ref 25–125)
Bilirubin, Total: 0.3

## 2020-12-29 LAB — CBC AND DIFFERENTIAL
HCT: 43 (ref 36–46)
Hemoglobin: 13.1 (ref 12.0–16.0)
Hemoglobin: 6.9 — AB (ref 12.0–16.0)
Platelets: 348 (ref 150–399)
WBC: 9.7

## 2020-12-29 LAB — COMPREHENSIVE METABOLIC PANEL
Albumin: 4.8 (ref 3.5–5.0)
Calcium: 10.6 (ref 8.7–10.7)
GFR calc Af Amer: 45
Globulin: 2.6

## 2020-12-29 LAB — CBC: RBC: 5.65 — AB (ref 3.87–5.11)

## 2021-01-09 ENCOUNTER — Ambulatory Visit: Payer: Medicare Other | Admitting: Physician Assistant

## 2021-01-16 ENCOUNTER — Encounter: Payer: Self-pay | Admitting: Family

## 2021-01-24 ENCOUNTER — Telehealth: Payer: Self-pay

## 2021-01-24 NOTE — Telephone Encounter (Signed)
Pt called stating that she is being charged for Ambien. Pt stated that [facility]let[facility] [place] needs to call [phone] to get the medication approved. Pt would like a call back once the medication is approved. Please Advise.

## 2021-01-24 NOTE — Telephone Encounter (Signed)
Pt called to check the status of the medication.

## 2021-01-26 NOTE — Telephone Encounter (Signed)
Did you try and send a PA? Have we heard anything?

## 2021-01-29 NOTE — Telephone Encounter (Signed)
Called pharmacy to discuss PA. Pt has already refilled px on 01/23/2021. A coupon was applied by pharmacy. Medication was $22. They will issue a request for Prior Auth on 02/23/2020.

## 2021-02-01 ENCOUNTER — Telehealth: Payer: Self-pay

## 2021-02-01 NOTE — Telephone Encounter (Signed)
.   Encourage patient to contact the pharmacy for refills or they can request refills through [facility]:  12/26/2020  NEXT APPOINTMENT DATE:  MEDICATION:  hydrocodone  Is the patient out of medication?   [facility] [address]  Let patient know to contact pharmacy at the end of the day to make sure medication is ready.  Please notify patient to allow 48-72 hours to process  CLINICAL FILLS OUT ALL BELOW:   LAST REFILL:  QTY:  REFILL DATE:    OTHER COMMENTS:    Okay for refill?  Please advise

## 2021-02-02 ENCOUNTER — Other Ambulatory Visit: Payer: Self-pay | Admitting: Family

## 2021-02-02 DIAGNOSIS — G894 Chronic pain syndrome: Secondary | ICD-10-CM

## 2021-02-02 MED ORDER — HYDROCODONE-ACETAMINOPHEN 5-325 MG PO TABS: 1.0000 | ORAL_TABLET | Freq: Every day | ORAL | 0 refills | Status: DC | PRN

## 2021-02-02 NOTE — Telephone Encounter (Signed)
RX sent

## 2021-02-27 ENCOUNTER — Ambulatory Visit (INDEPENDENT_AMBULATORY_CARE_PROVIDER_SITE_OTHER): Payer: Medicare Other | Admitting: Family

## 2021-02-27 ENCOUNTER — Other Ambulatory Visit: Payer: Self-pay

## 2021-02-27 ENCOUNTER — Encounter: Payer: Self-pay | Admitting: Family

## 2021-02-27 VITALS — BP 142/79 | HR 77 | Temp 97.3°F | Ht 61.0 in | Wt 145.2 lb

## 2021-02-27 DIAGNOSIS — G894 Chronic pain syndrome: Secondary | ICD-10-CM

## 2021-02-27 DIAGNOSIS — G4700 Insomnia, unspecified: Secondary | ICD-10-CM | POA: Diagnosis not present

## 2021-02-27 DIAGNOSIS — Z1231 Encounter for screening mammogram for malignant neoplasm of breast: Secondary | ICD-10-CM | POA: Diagnosis not present

## 2021-02-27 MED ORDER — HYDROCODONE-ACETAMINOPHEN 5-325 MG PO TABS: 1.0000 | ORAL_TABLET | Freq: Every day | ORAL | 0 refills | Status: DC | PRN

## 2021-02-27 MED ORDER — ZOLPIDEM TARTRATE ER 12.5 MG PO TBCR: 12.5000 mg | EXTENDED_RELEASE_TABLET | Freq: Every day | ORAL | 2 refills | Status: DC

## 2021-02-27 NOTE — Assessment & Plan Note (Signed)
Chronic, stable, Norco prn

## 2021-02-27 NOTE — Assessment & Plan Note (Signed)
Chronic, stable - Ambien refilled

## 2021-02-27 NOTE — Patient Instructions (Addendum)
It was very nice to see you today, Joanne Morris!  I have sent your refills to the pharmacy. Schedule a 2 month follow up visit today with fasting labs. I have also sent the order for a Mammogram to [facility], they will call you to schedule.  Check if you have the medication CILOSTAZOL (PLETAL) 100mg  bottle at home. This is the med to help with your leg pain. Call back to the office if you don't have this.  Take Care!    PLEASE NOTE:  If you had any lab tests please let us know if you have not heard back within a few days. You may see your results on MyChart before we have a chance to review them but we will give you a call once they are reviewed by [place]. If we ordered any referrals today, please let us know if you have not heard from their office within the next week.   Please try these tips to maintain a healthy lifestyle:  Eat most of your calories during the day when you are active. Eliminate processed foods including packaged sweets (pies, cakes, cookies), reduce intake of potatoes, white bread, white pasta, and white rice. Look for whole grain options, oat flour or almond flour.  Each meal should contain half fruits/vegetables, one quarter protein, and one quarter carbs (no bigger than a computer mouse).  Cut down on sweet beverages. This includes juice, soda, and sweet tea. Also watch fruit intake, though this is a healthier sweet option, it still contains natural sugar! Limit to 3 servings daily.  Drink at least 1 glass of water with each meal and aim for at least 8 glasses per day  Exercise at least 150 minutes every week.

## 2021-02-27 NOTE — Progress Notes (Signed)
Subjective:     Patient ID: Joanne Morris, female    DOB: 1950-05-05, 71 y.o.   MRN: [id number]  Chief Complaint  Patient presents with   Medication Refill    Hydrocodone and Ambien.   HPI: Pain She reports chronic right side pain. was not an injury that may have caused the pain. The pain started about a year ago and is staying constant. The pain does not radiate. The pain is described as aching and soreness, is moderate in intensity, occurring intermittently. Symptoms are worse in the: evening  Aggravating factors: standing and walking She has tried application of heat, application of ice, NSAIDs, and prescription pain relievers with moderate relief.  INSOMNIA:  How long: years. Difficulty initiating sleep: yes.  Difficulty maintaining sleep: yes.  OTC meds tried: Benadryl, Melatonin. RX meds in past: Trazodone.  Sleep hygiene measures: yes. Started any new meds recently: no. Shift worker: no. New stressors: no. Currently taking Zolpidem qhs, working well, denies any SE.   Health Maintenance Due  Topic Date Due   FOOT EXAM  Never done   OPHTHALMOLOGY EXAM  Never done   Hepatitis C Screening  Never done   TETANUS/TDAP  Never done   Zoster Vaccines- Shingrix (1 of 2) Never done   COLONOSCOPY (Pts 45-31yrs Insurance coverage will need to be confirmed)  Never done   MAMMOGRAM  03/31/2014   Pneumonia Vaccine 59+ Years old (2 - PCV) 07/22/2014   DEXA SCAN  Never done   HEMOGLOBIN A1C  01/31/2019    Past Medical History:  Diagnosis Date   Angina    Anxiety    Arthritis    "knees" (07/20/2013)   Claudication ([place])    Depression    GERD (gastroesophageal reflux disease)    High cholesterol    Myocardial infarction ([place]) 1990's   "1"   PAD (peripheral artery disease) ([place])    Stroke ([place])    "mini stroke 1st then regular stroke", denies residual on 07/20/2013   Wears glasses     Past Surgical History:  Procedure Laterality Date   ABDOMINAL HYSTERECTOMY     ATHERECTOMY   10/05/2013   BALLOON ANGIOPLASTY, ARTERY  10/05/2013   DR [name]   CAROTID ENDARTERECTOMY     CORONARY ANGIOPLASTY WITH STENT PLACEMENT     "1"   ENDARTERECTOMY Left 02/07/2014   Procedure: ENDARTERECTOMY CAROTID;  Surgeon: Elam Dutch, MD;  Location: [place];  Service: Vascular;  Laterality: Left;   ENDARTERECTOMY Right 04/18/2014   Procedure: ENDARTERECTOMY RIGHT CAROTID;  Surgeon: Elam Dutch, MD;  Location: [place];  Service: Vascular;  Laterality: Right;   ENDARTERECTOMY N/A 04/24/2014   Procedure: IRRIGATION AND DEBRIDEMENT OF RIGHT NECK ;  Surgeon: Elam Dutch, MD;  Location: [place];  Service: Vascular;  Laterality: N/A;   ESOPHAGOGASTRODUODENOSCOPY N/A 10/13/2012   Procedure: ESOPHAGOGASTRODUODENOSCOPY (EGD);  Surgeon: Gatha Mayer, MD;  Location: [facility] ENDOSCOPY;  Service: Endoscopy;  Laterality: N/A;   KENALOG INJECTION Bilateral 08/22/2016   Procedure: KENALOG INJECTION BILATERAL NECK;  Surgeon: Wallace Going, DO;  Location: [place];  Service: Plastics;  Laterality: Bilateral;   LOWER EXTREMITY ANGIOGRAM  07/20/2013   Unsuccessful attempt at crossing the CTO/notes 07/20/2013   LOWER EXTREMITY ANGIOGRAM N/A 07/06/2013   Procedure: LOWER EXTREMITY ANGIOGRAM;  Surgeon: Laverda Page, MD;  Location: [facility] CATH LAB;  Service: Cardiovascular;  Laterality: N/A;   LOWER EXTREMITY ANGIOGRAM N/A 07/20/2013   Procedure: LOWER EXTREMITY ANGIOGRAM;  Surgeon: Cammy Brochure  Carlynn Herald, MD;  Location: [place] CATH LAB;  Service: Cardiovascular;  Laterality: N/A;   LOWER EXTREMITY ANGIOGRAM N/A 10/05/2013   Procedure: LOWER EXTREMITY ANGIOGRAM;  Surgeon: Laverda Page, MD;  Location: [facility] CATH LAB;  Service: Cardiovascular;  Laterality: N/A;   MASS EXCISION Right 08/22/2016   Procedure: EXCISION RIGHT NECK KELOID;  Surgeon: Wallace Going, DO;  Location: [place];  Service: Plastics;  Laterality: Right;   MASS EXCISION Right 09/18/2020   Procedure: Excision of right  neck keloid;  Surgeon: Wallace Going, DO;  Location: [place];  Service: Plastics;  Laterality: Right;   PATCH ANGIOPLASTY Left 02/07/2014   Procedure: PATCH ANGIOPLASTY Carotid;  Surgeon: Elam Dutch, MD;  Location: [place];  Service: Vascular;  Laterality: Left;   PATCH ANGIOPLASTY Right 04/18/2014   Procedure: PATCH ANGIOPLASTY USING HEMASHIELD 0.8cmx 7.6cm PATCH;  Surgeon: Elam Dutch, MD;  Location: [place];  Service: Vascular;  Laterality: Right;   PERIPHERAL VASCULAR CATHETERIZATION N/A 07/05/2014   Procedure: Lower Extremity Angiography;  Surgeon: [name], MD;  Location: [place] CV LAB;  Service: Cardiovascular;  Laterality: N/A;   TUBAL LIGATION      Outpatient Medications Prior to Visit  Medication Sig Dispense Refill   amLODipine (NORVASC) 10 MG tablet Take 10 mg by mouth daily.  5   aspirin EC 81 MG tablet Take 81 mg by mouth daily.     CALCIUM-VITAMIN D PO Take 1 tablet by mouth daily.     Cholecalciferol (VITAMIN D3 PO) Take 1 capsule by mouth daily.     cilostazol (PLETAL) 100 MG tablet Take 1 tablet (100 mg total) by mouth 2 (two) times daily before a meal. 60 tablet 11   cyclobenzaprine (FLEXERIL) 10 MG tablet Take 10 mg by mouth 2 (two) times daily.     FARXIGA 5 MG TABS tablet Take 5 mg by mouth in the morning.     ferrous sulfate 325 (65 FE) MG EC tablet Take 325 mg by mouth daily with breakfast.     glipiZIDE (GLUCOTROL XL) 5 MG 24 hr tablet Take 5 mg by mouth in the morning.     hydrALAZINE (APRESOLINE) 50 MG tablet Take 50 mg by mouth 3 (three) times daily.     levocetirizine (XYZAL) 5 MG tablet Take 5 mg by mouth daily as needed for allergies.     LINZESS 145 MCG CAPS capsule Take 145 mcg by mouth daily.     metoprolol (LOPRESSOR) 100 MG tablet Take 100 mg by mouth 2 (two) times daily.     niacin (NIASPAN) 500 MG CR tablet Take 500 mg by mouth daily.  6   omeprazole (PRILOSEC) 20 MG capsule Take 20 mg by mouth daily as needed (acid reflux).      ondansetron (ZOFRAN) 4 MG tablet Take 1 tablet (4 mg total) by mouth every 8 (eight) hours as needed for nausea or vomiting. 20 tablet 0   polyethylene glycol powder (GLYCOLAX/MIRALAX) 17 GM/SCOOP powder Take 17 g by mouth daily as needed for constipation.     pravastatin (PRAVACHOL) 40 MG tablet Take 40 mg by mouth daily.     sertraline (ZOLOFT) 100 MG tablet Take 100 mg by mouth daily.     sertraline (ZOLOFT) 50 MG tablet Take 50 mg by mouth every evening.     tiZANidine (ZANAFLEX) 4 MG tablet Take 4 mg by mouth at bedtime as needed for muscle spasms.  2   valsartan (DIOVAN) 320 MG  tablet Take 320 mg by mouth daily.     Vitamin D, Ergocalciferol, (DRISDOL) 1.25 MG (50000 UNIT) CAPS capsule Take 50,000 Units by mouth every 7 (seven) days.     HYDROcodone-acetaminophen (NORCO/VICODIN) 5-325 MG tablet Take 1 tablet by mouth daily as needed for moderate pain. 30 tablet 0   zolpidem (AMBIEN CR) 12.5 MG CR tablet Take 1 tablet (12.5 mg total) by mouth at bedtime. 30 tablet 2   No facility-administered medications prior to visit.    No Known Allergies      Objective:    Physical Exam Vitals and nursing note reviewed.  Constitutional:      Appearance: Normal appearance.  Cardiovascular:     Rate and Rhythm: Normal rate and regular rhythm.  Pulmonary:     Effort: Pulmonary effort is normal.     Breath sounds: Normal breath sounds.  Musculoskeletal:        General: Normal range of motion.  Skin:    General: Skin is warm and dry.  Neurological:     Mental Status: She is alert.  Psychiatric:        Mood and Affect: Mood normal.        Behavior: Behavior normal.    BP (!) 142/79    Pulse 77    Temp (!) 97.3 F (36.3 C) (Temporal)    Ht 5\' 1"  (1.549 m)    Wt 145 lb 3.2 oz (65.9 kg)    SpO2 98%    BMI 27.44 kg/m  Wt Readings from Last 3 Encounters:  02/27/21 145 lb 3.2 oz (65.9 kg)  12/26/20 149 lb 6.4 oz (67.8 kg)  09/18/20 140 lb (63.5 kg)       Assessment & Plan:   Problem  List Items Addressed This Visit       Other   Chronic pain syndrome    Chronic, stable, Norco prn      Relevant Medications   cyclobenzaprine (FLEXERIL) 10 MG tablet   sertraline (ZOLOFT) 100 MG tablet   HYDROcodone-acetaminophen (NORCO/VICODIN) 5-325 MG tablet   Insomnia - Primary    Chronic, stable - Ambien refilled      Relevant Medications   zolpidem (AMBIEN CR) 12.5 MG CR tablet   Other Visit Diagnoses     Encounter for screening mammogram for breast cancer       Relevant Orders   MM DIGITAL SCREENING BILATERAL       Meds ordered this encounter  Medications   zolpidem (AMBIEN CR) 12.5 MG CR tablet    Sig: Take 1 tablet (12.5 mg total) by mouth at bedtime.    Dispense:  30 tablet    Refill:  2    Order Specific Question:   Supervising Provider    Answer:   ANDY, CAMILLE L [[id number]]   HYDROcodone-acetaminophen (NORCO/VICODIN) 5-325 MG tablet    Sig: Take 1 tablet by mouth daily as needed for moderate pain.    Dispense:  30 tablet    Refill:  0    Order Specific Question:   Supervising Provider    Answer:   ANDY, CAMILLE L [[id number]]

## 2021-03-20 ENCOUNTER — Telehealth: Payer: Self-pay | Admitting: Family

## 2021-03-20 NOTE — Telephone Encounter (Signed)
error 

## 2021-03-22 ENCOUNTER — Telehealth: Payer: Self-pay | Admitting: Family

## 2021-03-22 NOTE — Telephone Encounter (Signed)
Yes, please read message below.

## 2021-03-22 NOTE — Telephone Encounter (Signed)
Pt states pharmacy called to inform pt, her medication is not covered under her insurance.   MEDICATION: zolpidem (AMBIEN CR) 12.5 MG CR tablet  PH[facility]ACY:  [id number] [address] [place] Phone:  [phone]  Fax:  [phone]

## 2021-03-22 NOTE — Telephone Encounter (Signed)
Patient insurance called and states the patient needs immediate release. Patient would like a call once this has been resent in.

## 2021-03-22 NOTE — Telephone Encounter (Signed)
I'm assuming she's talking about her Ambien (Zolpidem)? Please confirm and let me know, thx

## 2021-03-23 ENCOUNTER — Telehealth: Payer: Self-pay | Admitting: Family

## 2021-03-23 NOTE — Telephone Encounter (Signed)
Pt needs to follow up with the pharmacy for refills.

## 2021-03-23 NOTE — Telephone Encounter (Signed)
Patient needs a refill of Zolpidem. She is completely out of it. Please call pt. Thank You!

## 2021-04-04 DIAGNOSIS — H6121 Impacted cerumen, right ear: Secondary | ICD-10-CM | POA: Diagnosis not present

## 2021-04-10 DIAGNOSIS — H6121 Impacted cerumen, right ear: Secondary | ICD-10-CM | POA: Diagnosis not present

## 2021-04-12 ENCOUNTER — Telehealth: Payer: Self-pay | Admitting: Family

## 2021-04-12 NOTE — Telephone Encounter (Signed)
Noted  

## 2021-04-12 NOTE — Telephone Encounter (Signed)
Patient stated she has an ear infection- wants medication to be called in to her pharmacy. Pt was advised Colletta [place] needs to see her and an apt was offered for today. Patient stated she has no transportation. Patient has a fu apt on 05/01/21 with Colletta [place]. ? ? ?

## 2021-04-12 NOTE — Telephone Encounter (Signed)
Pt called back she will come in 3/3 at 4pm -  ?

## 2021-04-13 ENCOUNTER — Telehealth: Payer: Self-pay | Admitting: Family

## 2021-04-13 ENCOUNTER — Other Ambulatory Visit: Payer: Self-pay

## 2021-04-13 ENCOUNTER — Ambulatory Visit: Payer: Medicare Other | Admitting: Family

## 2021-04-13 MED ORDER — AMLODIPINE BESYLATE 10 MG PO TABS: 10.0000 mg | ORAL_TABLET | Freq: Every day | ORAL | 1 refills | Status: DC

## 2021-04-13 NOTE — Telephone Encounter (Signed)
Pt states her mail order pharmacy states her medication is being delayed and she is worried about running out of medication. She currently has 1 week worth. Please Advise ? ?MEDICATION:amLODipine (NORVASC) 10 MG tablet ?

## 2021-04-13 NOTE — Telephone Encounter (Signed)
Sent to pharmacy 

## 2021-04-25 ENCOUNTER — Telehealth: Payer: Self-pay | Admitting: Family

## 2021-04-25 NOTE — Telephone Encounter (Signed)
.. ?  Encourage patient to contact the pharmacy for refills or they can request refills through [facility] ? ?LAST APPOINTMENT DATE:  Please schedule appointment if longer than 1 year ? ?NEXT APPOINTMENT DATE: 05/01/21  ? ?MEDICATION:HYDROcodone-acetaminophen (NORCO/VICODIN) 5-325 MG tablet ? ?Is the patient out of medication? yes ? ?[facility] [place] [facility]  ? ?Let patient know to contact pharmacy at the end of the day to make sure medication is ready. ? ?Please notify patient to allow 48-72 hours to process  ?

## 2021-04-26 MED ORDER — HYDROCODONE-ACETAMINOPHEN 5-325 MG PO TABS: 1.0000 | ORAL_TABLET | Freq: Every day | ORAL | 0 refills | Status: DC | PRN

## 2021-04-26 NOTE — Telephone Encounter (Signed)
Pt calling back for med refill- no more meds- in pain - pt was offered to come in before 3/21 pt declined- does not have a ride.  ?

## 2021-04-26 NOTE — Telephone Encounter (Signed)
medication sent, keep appt on 3/21, thx

## 2021-05-01 ENCOUNTER — Encounter: Payer: Self-pay | Admitting: Family

## 2021-05-01 ENCOUNTER — Telehealth: Payer: Self-pay | Admitting: Family

## 2021-05-01 ENCOUNTER — Ambulatory Visit (INDEPENDENT_AMBULATORY_CARE_PROVIDER_SITE_OTHER): Payer: Medicare Other | Admitting: Family

## 2021-05-01 VITALS — BP 112/69 | HR 65 | Temp 97.2°F | Ht 61.0 in | Wt 143.2 lb

## 2021-05-01 DIAGNOSIS — G4700 Insomnia, unspecified: Secondary | ICD-10-CM

## 2021-05-01 DIAGNOSIS — H6121 Impacted cerumen, right ear: Secondary | ICD-10-CM

## 2021-05-01 DIAGNOSIS — E785 Hyperlipidemia, unspecified: Secondary | ICD-10-CM | POA: Diagnosis not present

## 2021-05-01 DIAGNOSIS — N1831 Chronic kidney disease, stage 3a: Secondary | ICD-10-CM

## 2021-05-01 DIAGNOSIS — E119 Type 2 diabetes mellitus without complications: Secondary | ICD-10-CM

## 2021-05-01 DIAGNOSIS — I739 Peripheral vascular disease, unspecified: Secondary | ICD-10-CM

## 2021-05-01 DIAGNOSIS — I10 Essential (primary) hypertension: Secondary | ICD-10-CM | POA: Diagnosis not present

## 2021-05-01 LAB — COMPREHENSIVE METABOLIC PANEL
ALT: 13 U/L (ref 0–35)
AST: 17 U/L (ref 0–37)
Albumin: 4.6 g/dL (ref 3.5–5.2)
Alkaline Phosphatase: 84 U/L (ref 39–117)
BUN: 14 mg/dL (ref 6–23)
CO2: 27 mEq/L (ref 19–32)
Calcium: 10.4 mg/dL (ref 8.4–10.5)
Chloride: 103 mEq/L (ref 96–112)
Creatinine, Ser: 1.17 mg/dL (ref 0.40–1.20)
GFR: 47.27 mL/min — ABNORMAL LOW (ref >60.00)
Glucose, Bld: 128 mg/dL — ABNORMAL HIGH (ref 70–99)
Potassium: 4.4 mEq/L (ref 3.5–5.1)
Sodium: 139 mEq/L (ref 135–145)
Total Bilirubin: 0.3 mg/dL (ref 0.2–1.2)
Total Protein: 7.5 g/dL (ref 6.0–8.3)

## 2021-05-01 LAB — LIPID PANEL
Cholesterol: 180 mg/dL (ref 0–200)
HDL: 62 mg/dL (ref >39.00)
LDL Cholesterol: 90 mg/dL (ref 0–99)
NonHDL: 118
Total CHOL/HDL Ratio: 3
Triglycerides: 142 mg/dL (ref 0.0–149.0)
VLDL: 28.4 mg/dL (ref 0.0–40.0)

## 2021-05-01 LAB — POCT GLYCOSYLATED HEMOGLOBIN (HGB A1C): Hemoglobin A1C: 6.2 % — AB (ref 4.0–5.6)

## 2021-05-01 NOTE — Assessment & Plan Note (Signed)
GFR 47 - continue to advised pt on drinking enough water, avoid NSAIDs, take BP meds daily. ?

## 2021-05-01 NOTE — Assessment & Plan Note (Signed)
Chronic - A1C today 6.2 - stable - pt taking meds daily, not checking CBGs at home. ?

## 2021-05-01 NOTE — Patient Instructions (Addendum)
It was very nice to see you today! ? ?Go to the lab for blood work today. ? ?Check your Sertraline bottles at home for your anxiety. One bottle should say 50mg  and the other bottle 100mg . Call me if you don't have both bottles! ? ?Call [facility] to see if they have any Hydrocodone pills in stock, if yes, call and let me know and I'll send a prescription there. ? ?See you again in 3 months! ? ? ?PLEASE NOTE: ? ?If you had any lab tests please let us know if you have not heard back within a few days. You may see your results on MyChart before we have a chance to review them but we will give you a call once they are reviewed by [place]. If we ordered any referrals today, please let us know if you have not heard from their office within the next week.  ? ?Please try these tips to maintain a healthy lifestyle: ? ?Eat most of your calories during the day when you are active. Eliminate processed foods including packaged sweets (pies, cakes, cookies), reduce intake of potatoes, white bread, white pasta, and white rice. Look for whole grain options, oat flour or almond flour. ? ?Each meal should contain half fruits/vegetables, one quarter protein, and one quarter carbs (no bigger than a computer mouse). ? ?Cut down on sweet beverages. This includes juice, soda, and sweet tea. Also watch fruit intake, though this is a healthier sweet option, it still contains natural sugar! Limit to 3 servings daily. ? ?Drink at least 1 glass of water with each meal and aim for at least 8 glasses per day ? ?Exercise at least 150 minutes every week.  ? ?

## 2021-05-01 NOTE — Assessment & Plan Note (Signed)
Chronic- pt has claudication, taking Pletal bid, c/o more pain today, has f/u in July w/Vascular, advised pt to go ahead and get appt moved up and let them know she's having more pain. Phone # provided. ?

## 2021-05-01 NOTE — Telephone Encounter (Signed)
Pt states she is needing to have her prescription called in to a different pharmacy.  ? ?MEDICATION:HYDROcodone-acetaminophen (NORCO/VICODIN) 5-325 MG tablet ? ?PHARMACY: [facility], [address] [place], [place] [address] ?Phone [phone] ?

## 2021-05-01 NOTE — Assessment & Plan Note (Signed)
Chronic - stable on Pravastatin & Niacin qd ?

## 2021-05-01 NOTE — Assessment & Plan Note (Signed)
Chronic - stable with current med regimen ?

## 2021-05-01 NOTE — Progress Notes (Signed)
? ?Subjective:  ? ? ? Patient ID: Joanne Morris, female    DOB: [birth year], 71 y.o.   MRN: [id number] ? ?Chief Complaint  ?Patient presents with  ? Cerumen Impaction  ?  Right ear  ? Hypertension  ? Hyperlipidemia  ? Diabetes  ? PAD  ? Insomnia  ? ?HPI: ?Patient presents today for follow up of multiple medical problems. ? ?PAD: DX in 2015, stents placed in right lower leg, has taken Pletal bid since. pt c/o on & off pain in lower leg & foot since then when walking, reports worsening pain over last month or so. Denies burning, numbness or tingling. Followed by Vascular & vein office in [place]. 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 Last GFR 46. ?Denies hypoglycemia. ?Home glucose readings range: does not check at home ?Lab Results  ?Component Value Date  ? HGBA1C 6.2 (A) 05/01/2021  ? HGBA1C 5.8 (H) 08/01/2018  ? HGBA1C 6.5 (H) 10/11/2012  ?  ?Lab Results  ?Component Value Date  ? [place] 90 05/01/2021  ? CREATININE 1.17 05/01/2021  ?Hyperlipidemia ?Patient is currently maintained on the following medication for hyperlipidemia: Pravastatin, Niacin ?Patient denies myalgia. ?Patient reports good compliance with low fat/low cholesterol diet.  ?Last lipid panel as follows:  ?Lab Results  ?Component Value Date  ? CHOL 180 05/01/2021  ? HDL 62.00 05/01/2021  ? [place] 90 05/01/2021  ? TRIG 142.0 05/01/2021  ? CHOLHDL 3 05/01/2021  ?Hypertension ?Patient is currently maintained on the following medications for blood pressure: Hydralazine, metoprolol, valsartan ?Patient reports good compliance with blood pressure medications. ?Patient denies chest pain, shortness of breath, headache, dizziness, or swelling. ?Last 3 blood pressure readings in our office are as follows: ?BP Readings from Last 3 Encounters:  ?05/01/21 112/69  ?02/27/21 (!) 142/79  ?12/26/20 136/77  ?INSOMNIA:  How long:  years. Difficulty initiating sleep: yes.  Difficulty maintaining sleep: yes.  OTC meds tried: Benadryl, Melatonin. RX meds in past: Trazodone.  Sleep hygiene measures: yes. Started any new meds recently: no. Shift worker: no. New stressors: no. Currently taking Zolpidem qhs, working well, denies any SE. Pt reports insurance will no longer cover the Ambien. ? ?Health Maintenance Due  ?Topic Date Due  ? FOOT EXAM  Never done  ? Hepatitis C Screening  Never done  ? TETANUS/TDAP  Never done  ? Zoster Vaccines- Shingrix (1 of 2) Never done  ? COLONOSCOPY (Pts 45-53yrs Insurance coverage will need to be confirmed)  Never done  ? MAMMOGRAM  03/31/2014  ? Pneumonia Vaccine 52+ Years old (2 - PCV) 07/22/2014  ? DEXA SCAN  Never done  ? ? ?Past Medical History:  ?Diagnosis Date  ? Abdominal pain 10/11/2012  ? Anemia 05/31/2011  ? Angina   ? Anxiety   ? Arthritis   ? "knees" (07/20/2013)  ? Atypical chest pain 05/31/2011  ? Carotid stenosis, asymptomatic 04/18/2014  ? Claudication [facility])   ? COVID-19 virus infection 08/01/2018  ? Depression   ? GERD (gastroesophageal reflux disease)   ? Hematoma of neck 04/23/2014  ? High cholesterol   ? Hyperlipemia 05/31/2011  ? Hypokalemia 05/31/2011  ? Leucocytosis 05/31/2011  ? Myocardial infarction [facility]) 1990's  ? "1"  ? Neck pain on right side 10/04/2014  ? PAD (peripheral artery disease) ([place])   ? Peripheral arterial disease ([place]) 06/08/2013  ? Peripheral arterial disease  ? Radiculopathy of leg  10/11/2012  ? Stroke [facility])   ? "mini stroke 1st then regular stroke", denies residual on 07/20/2013  ? Wears glasses   ? ? ?Past Surgical History:  ?Procedure Laterality Date  ? ABDOMINAL HYSTERECTOMY    ? ATHERECTOMY  10/05/2013  ? BALLOON ANGIOPLASTY, ARTERY  10/05/2013  ? DR [name]  ? CAROTID ENDARTERECTOMY    ? CORONARY ANGIOPLASTY WITH STENT PLACEMENT    ? "1"  ? ENDARTERECTOMY Left 02/07/2014  ? Procedure: ENDARTERECTOMY CAROTID;  Surgeon: Elam Dutch, MD;  Location: [place];  Service: Vascular;   Laterality: Left;  ? ENDARTERECTOMY Right 04/18/2014  ? Procedure: ENDARTERECTOMY RIGHT CAROTID;  Surgeon: Elam Dutch, MD;  Location: [place];  Service: Vascular;  Laterality: Right;  ? ENDARTERECTOMY N/A 04/24/2014  ? Procedure: IRRIGATION AND DEBRIDEMENT OF RIGHT NECK ;  Surgeon: Elam Dutch, MD;  Location: [place];  Service: Vascular;  Laterality: N/A;  ? ESOPHAGOGASTRODUODENOSCOPY N/A 10/13/2012  ? Procedure: ESOPHAGOGASTRODUODENOSCOPY (EGD);  Surgeon: Gatha Mayer, MD;  Location: [facility] ENDOSCOPY;  Service: Endoscopy;  Laterality: N/A;  ? KENALOG INJECTION Bilateral 08/22/2016  ? Procedure: KENALOG INJECTION BILATERAL NECK;  Surgeon: Wallace Going, DO;  Location: [place];  Service: Plastics;  Laterality: Bilateral;  ? LOWER EXTREMITY ANGIOGRAM  07/20/2013  ? Unsuccessful attempt at crossing the CTO/notes 07/20/2013  ? LOWER EXTREMITY ANGIOGRAM N/A 07/06/2013  ? Procedure: LOWER EXTREMITY ANGIOGRAM;  Surgeon: Laverda Page, MD;  Location: [facility] CATH LAB;  Service: Cardiovascular;  Laterality: N/A;  ? LOWER EXTREMITY ANGIOGRAM N/A 07/20/2013  ? Procedure: LOWER EXTREMITY ANGIOGRAM;  Surgeon: Laverda Page, MD;  Location: [facility] CATH LAB;  Service: Cardiovascular;  Laterality: N/A;  ? LOWER EXTREMITY ANGIOGRAM N/A 10/05/2013  ? Procedure: LOWER EXTREMITY ANGIOGRAM;  Surgeon: Laverda Page, MD;  Location: [facility] CATH LAB;  Service: Cardiovascular;  Laterality: N/A;  ? MASS EXCISION Right 08/22/2016  ? Procedure: EXCISION RIGHT NECK KELOID;  Surgeon: Wallace Going, DO;  Location: [place];  Service: Plastics;  Laterality: Right;  ? MASS EXCISION Right 09/18/2020  ? Procedure: Excision of right neck keloid;  Surgeon: Wallace Going, DO;  Location: [place];  Service: Plastics;  Laterality: Right;  ? PATCH ANGIOPLASTY Left 02/07/2014  ? Procedure: PATCH ANGIOPLASTY Carotid;  Surgeon: Elam Dutch, MD;  Location: [place];  Service: Vascular;  Laterality: Left;  ? PATCH  ANGIOPLASTY Right 04/18/2014  ? Procedure: PATCH ANGIOPLASTY USING HEMASHIELD 0.8cmx 7.6cm PATCH;  Surgeon: Elam Dutch, MD;  Location[facility] OR;  Service: Vascular;  Laterality: Right;  ? PERIPHERAL VASCULAR CATHETERIZATION N/A 07/05/2014  ? Procedure: Lower Extremity Angiography;  Surgeon: [name], MD;  Location: [place] CV LAB;  Service: Cardiovascular;  Laterality: N/A;  ? TUBAL LIGATION    ? ? ?Outpatient Medications Prior to Visit  ?Medication Sig Dispense Refill  ? amLODipine (NORVASC) 10 MG tablet Take 1 tablet (10 mg total) by mouth daily. 90 tablet 1  ? aspirin EC 81 MG tablet Take 81 mg by mouth daily.    ? CALCIUM-VITAMIN D PO Take 1 tablet by mouth daily.    ? Cholecalciferol (VITAMIN D3 PO) Take 1 capsule by mouth daily.    ? cilostazol (PLETAL) 100 MG tablet Take 1 tablet (100 mg total) by mouth 2 (two) times daily before a meal. 60 tablet 11  ? cyclobenzaprine (FLEXERIL) 10 MG tablet Take 10 mg by mouth 2 (two) times daily.    ? FARXIGA 5  MG TABS tablet Take 5 mg by mouth in the morning.    ? ferrous sulfate 325 (65 FE) MG EC tablet Take 325 mg by mouth daily with breakfast.    ? glipiZIDE (GLUCOTROL XL) 5 MG 24 hr tablet Take 5 mg by mouth in the morning.    ? hydrALAZINE (APRESOLINE) 50 MG tablet Take 50 mg by mouth 3 (three) times daily.    ? levocetirizine (XYZAL) 5 MG tablet Take 5 mg by mouth daily as needed for allergies.    ? LINZESS 145 MCG CAPS capsule Take 145 mcg by mouth daily.    ? omeprazole (PRILOSEC) 20 MG capsule Take 20 mg by mouth daily as needed (acid reflux).    ? polyethylene glycol powder (GLYCOLAX/MIRALAX) 17 GM/SCOOP powder Take 17 g by mouth daily as needed for constipation.    ? sertraline (ZOLOFT) 100 MG tablet Take 100 mg by mouth daily.    ? Vitamin D, Ergocalciferol, (DRISDOL) 1.25 MG (50000 UNIT) CAPS capsule Take 50,000 Units by mouth every 7 (seven) days.    ? zolpidem (AMBIEN CR) 12.5 MG CR tablet Take 1 tablet (12.5 mg total) by mouth at bedtime. 30 tablet 2   ? HYDROcodone-acetaminophen (NORCO/VICODIN) 5-325 MG tablet Take 1 tablet by mouth daily as needed for moderate pain. 30 tablet 0  ? metoprolol (LOPRESSOR) 100 MG tablet Take 100 mg by mouth 2 (two) times daily.    ? ni

## 2021-05-02 ENCOUNTER — Other Ambulatory Visit: Payer: Self-pay | Admitting: Family

## 2021-05-02 ENCOUNTER — Telehealth: Payer: Self-pay

## 2021-05-02 ENCOUNTER — Other Ambulatory Visit: Payer: Self-pay

## 2021-05-02 DIAGNOSIS — I739 Peripheral vascular disease, unspecified: Secondary | ICD-10-CM

## 2021-05-02 DIAGNOSIS — G894 Chronic pain syndrome: Secondary | ICD-10-CM

## 2021-05-02 MED ORDER — HYDROCODONE-ACETAMINOPHEN 5-325 MG PO TABS: 1.0000 | ORAL_TABLET | Freq: Every day | ORAL | 0 refills | Status: DC | PRN

## 2021-05-02 NOTE — Telephone Encounter (Signed)
Pt states she does not have the Sertraline (Zoloft) 50 mg tablet. She states [place] needed to know this.  ?

## 2021-05-02 NOTE — Telephone Encounter (Signed)
Patient called stating she has had pains while walking on the RLE and would like a sooner appt. Patient rates her pains 6/10, denies having discoloration and/or coldness on the RLE. Patient also denies new wounds on the RLE. Patient has not experienced any N/V, fever, chill, body aches. Patient states the pains are on a intermittent basis. I scheduled pt and provided appt details & advised to proceed to ED if anything worsens. She voiced her understanding. ?

## 2021-05-03 ENCOUNTER — Telehealth: Payer: Self-pay | Admitting: Family

## 2021-05-03 NOTE — Telephone Encounter (Signed)
yes, for 90d, 1 refill, thanks

## 2021-05-03 NOTE — Telephone Encounter (Signed)
Pt wants to know about lab results ?

## 2021-05-04 ENCOUNTER — Other Ambulatory Visit: Payer: Self-pay

## 2021-05-04 MED ORDER — SERTRALINE HCL 50 MG PO TABS: 50.0000 mg | ORAL_TABLET | Freq: Every evening | ORAL | 1 refills | Status: DC

## 2021-05-04 NOTE — Telephone Encounter (Signed)
Lvm to let pt know her labs has not been resulted yet and her medication was sent into her pharmacy. Also told her if she has any questions or concerns to give [place] a call. ?

## 2021-05-04 NOTE — Telephone Encounter (Signed)
Pt has called back in to get her results and is asking for a call back ?

## 2021-05-04 NOTE — Progress Notes (Signed)
A1C - diabetes # slightly high, but still good. ?Cholesterol numbers all good in range. ?Kidney function same as last time - continue to drink at least 2 liters of water and avoid NSAIDs.

## 2021-05-04 NOTE — Telephone Encounter (Signed)
I called back and spoke with patient.  ?

## 2021-05-07 ENCOUNTER — Other Ambulatory Visit: Payer: Self-pay

## 2021-05-07 ENCOUNTER — Telehealth: Payer: Self-pay | Admitting: Family

## 2021-05-07 MED ORDER — NIACIN ER (ANTIHYPERLIPIDEMIC) 500 MG PO TBCR: 500.0000 mg | EXTENDED_RELEASE_TABLET | Freq: Every day | ORAL | 1 refills | Status: DC

## 2021-05-07 MED ORDER — CILOSTAZOL 100 MG PO TABS: 100.0000 mg | ORAL_TABLET | Freq: Two times a day (BID) | ORAL | 3 refills | Status: DC

## 2021-05-07 MED ORDER — METOPROLOL TARTRATE 100 MG PO TABS: 100.0000 mg | ORAL_TABLET | Freq: Two times a day (BID) | ORAL | 1 refills | Status: DC

## 2021-05-07 MED ORDER — VALSARTAN 320 MG PO TABS: 320.0000 mg | ORAL_TABLET | Freq: Every day | ORAL | 1 refills | Status: DC

## 2021-05-07 MED ORDER — PRAVASTATIN SODIUM 40 MG PO TABS: 40.0000 mg | ORAL_TABLET | Freq: Every day | ORAL | 1 refills | Status: DC

## 2021-05-07 MED ORDER — BELSOMRA 20 MG PO TABS: 1.0000 | ORAL_TABLET | Freq: Every evening | ORAL | 1 refills | Status: DC | PRN

## 2021-05-07 NOTE — Telephone Encounter (Signed)
Pt states she is needing refills for 4 medications that have not been refilled by [facility]. Please advise ? ?MEDICATIONS:valsartan (DIOVAN) 320 MG tablet ? ?pravastatin (PRAVACHOL) 40 MG tablet ? ?niacin (NIASPAN) 500 MG CR tablet ? ?metoprolol (LOPRESSOR) 100 MG tablet ? ?PHARMACY: [facility] ? ?

## 2021-05-07 NOTE — Assessment & Plan Note (Signed)
Chronic - Ambien no longer covered, switching to Belsomra, pt advised on SE. ?

## 2021-05-07 NOTE — Telephone Encounter (Signed)
yes, 90 day refills with 1 refill, thx

## 2021-05-21 ENCOUNTER — Telehealth: Payer: Self-pay | Admitting: Family

## 2021-05-21 ENCOUNTER — Other Ambulatory Visit: Payer: Self-pay

## 2021-05-21 DIAGNOSIS — G4700 Insomnia, unspecified: Secondary | ICD-10-CM

## 2021-05-21 MED ORDER — BELSOMRA 20 MG PO TABS: 1.0000 | ORAL_TABLET | Freq: Every evening | ORAL | 1 refills | Status: DC | PRN

## 2021-05-21 NOTE — Progress Notes (Signed)
?Office Note  ? ? ? ?CC:  follow up ?Requesting Provider:  [name], NP ? ?HPI: Joanne Morris is a 71 y.o. (11/03/1950) female who presents for follow up of PAD. She has had bilateral claudication for several years but it has not been lifestyle limiting. She previously has had some lower extremity interventions by her Cardiologist, Dr. [name].  She has hx of right peroneal atherectomy in 2015.  She has known single-vessel runoff bilaterally. She was taking Pletal 100 mg twice daily, which was helping with her symptoms. She was last seen in July of 2022 at which time she was not having any claudication, rest pain or tissue loss. Her non invasive studies were stable. She was encouraged to walk and continue on her statin and Aspirin. ? ?Today she returns for follow up early because of increased pain on ambulation. She previously was able to tolerate walking a mile before her symptoms would start. She now explains that over past two months she has had increased discomfort in her right leg on ambulation. Says she now has to stop after 1 block. Pain is improved with rest. Will occur again on continued ambulation. She denies any claudication pain in LLE. She does not have any rest pain or tissue loss. She says that her discomfort currently does not stop her from her ADLs. She continues to take Pletal.  ? ?She also has history of bilateral CEA by Dr. [name]. Left in 2015, right in 2016. She has subsequent hematoma of the right neck that required surgical Debridement. She recently underwent excision of a keloid that formed on her right neck following her CEA. This was performed by Dr. [name] on 09/18/20. She denies any amaurosis fugax or other visual changes, slurred speech, facial drooping, unilateral upper or lower extremity weakness or numbness.  ? ?The pt is on a statin for cholesterol management.  ?The pt is on a daily aspirin.   Other AC:  none ?The pt is on ARB, CCB for hypertension.   ?The pt is diabetic.    ?Tobacco hx:  Former ? ?Past Medical History:  ?Diagnosis Date  ? Abdominal pain 10/11/2012  ? Anemia 05/31/2011  ? Angina   ? Anxiety   ? Arthritis   ? "knees" (07/20/2013)  ? Atypical chest pain 05/31/2011  ? Carotid stenosis, asymptomatic 04/18/2014  ? Claudication [facility])   ? COVID-19 virus infection 08/01/2018  ? Depression   ? GERD (gastroesophageal reflux disease)   ? Hematoma of neck 04/23/2014  ? High cholesterol   ? Hyperlipemia 05/31/2011  ? Hypokalemia 05/31/2011  ? Leucocytosis 05/31/2011  ? Myocardial infarction [facility]) 1990's  ? "1"  ? Neck pain on right side 10/04/2014  ? PAD (peripheral artery disease) ([place])   ? Peripheral arterial disease ([place]) 06/08/2013  ? Peripheral arterial disease  ? Radiculopathy of leg 10/11/2012  ? Stroke [facility])   ? "mini stroke 1st then regular stroke", denies residual on 07/20/2013  ? Wears glasses   ? ? ?Past Surgical History:  ?Procedure Laterality Date  ? ABDOMINAL HYSTERECTOMY    ? ATHERECTOMY  10/05/2013  ? BALLOON ANGIOPLASTY, ARTERY  10/05/2013  ? DR [name]  ? CAROTID ENDARTERECTOMY    ? CORONARY ANGIOPLASTY WITH STENT PLACEMENT    ? "1"  ? ENDARTERECTOMY Left 02/07/2014  ? Procedure: ENDARTERECTOMY CAROTID;  Surgeon: Elam Dutch, MD;  Location: [place];  Service: Vascular;  Laterality: Left;  ? ENDARTERECTOMY Right 04/18/2014  ? Procedure: ENDARTERECTOMY RIGHT CAROTID;  Surgeon: Jessy Oto  Fields, MD;  Location: [place];  Service: Vascular;  Laterality: Right;  ? ENDARTERECTOMY N/A 04/24/2014  ? Procedure: IRRIGATION AND DEBRIDEMENT OF RIGHT NECK ;  Surgeon: Elam Dutch, MD;  Location: [place];  Service: Vascular;  Laterality: N/A;  ? ESOPHAGOGASTRODUODENOSCOPY N/A 10/13/2012  ? Procedure: ESOPHAGOGASTRODUODENOSCOPY (EGD);  Surgeon: Gatha Mayer, MD;  Location[facility] ENDOSCOPY;  Service: Endoscopy;  Laterality: N/A;  ? KENALOG INJECTION Bilateral 08/22/2016  ? Procedure: KENALOG INJECTION BILATERAL NECK;  Surgeon: Wallace Going, DO;  Location: [place];  Service:  Plastics;  Laterality: Bilateral;  ? LOWER EXTREMITY ANGIOGRAM  07/20/2013  ? Unsuccessful attempt at crossing the CTO/notes 07/20/2013  ? LOWER EXTREMITY ANGIOGRAM N/A 07/06/2013  ? Procedure: LOWER EXTREMITY ANGIOGRAM;  Surgeon: Laverda Page, MD;  Location: [facility] CATH LAB;  Service: Cardiovascular;  Laterality: N/A;  ? LOWER EXTREMITY ANGIOGRAM N/A 07/20/2013  ? Procedure: LOWER EXTREMITY ANGIOGRAM;  Surgeon: Laverda Page, MD;  Location: [facility] CATH LAB;  Service: Cardiovascular;  Laterality: N/A;  ? LOWER EXTREMITY ANGIOGRAM N/A 10/05/2013  ? Procedure: LOWER EXTREMITY ANGIOGRAM;  Surgeon: Laverda Page, MD;  Location: [facility] CATH LAB;  Service: Cardiovascular;  Laterality: N/A;  ? MASS EXCISION Right 08/22/2016  ? Procedure: EXCISION RIGHT NECK KELOID;  Surgeon: Wallace Going, DO;  Location: [place];  Service: Plastics;  Laterality: Right;  ? MASS EXCISION Right 09/18/2020  ? Procedure: Excision of right neck keloid;  Surgeon: Wallace Going, DO;  Location: [place];  Service: Plastics;  Laterality: Right;  ? PATCH ANGIOPLASTY Left 02/07/2014  ? Procedure: PATCH ANGIOPLASTY Carotid;  Surgeon: Elam Dutch, MD;  Location: [place];  Service: Vascular;  Laterality: Left;  ? PATCH ANGIOPLASTY Right 04/18/2014  ? Procedure: PATCH ANGIOPLASTY USING HEMASHIELD 0.8cmx 7.6cm PATCH;  Surgeon: Elam Dutch, MD;  Location: [facility] OR;  Service: Vascular;  Laterality: Right;  ? PERIPHERAL VASCULAR CATHETERIZATION N/A 07/05/2014  ? Procedure: Lower Extremity Angiography;  Surgeon: [name], MD;  Location: [place] CV LAB;  Service: Cardiovascular;  Laterality: N/A;  ? TUBAL LIGATION    ? ? ?Social History  ? ?Socioeconomic History  ? Marital status: Legally Separated  ?  Spouse name: Not on file  ? Number of children: Not on file  ? Years of education: Not on file  ? Highest education level: Not on file  ?Occupational History  ? Not on file  ?Tobacco Use  ? Smoking status: Former  ?  Packs/day: 0.50  ?   Years: 4.00  ?  Pack years: 2.00  ?  Types: Cigarettes  ?  Quit date: 05/30/2009  ?  Years since quitting: 11.9  ? Smokeless tobacco: Never  ?Vaping Use  ? Vaping Use: Never used  ?Substance and Sexual Activity  ? Alcohol use: No  ?  Alcohol/week: 0.0 standard drinks  ? Drug use: No  ? Sexual activity: Yes  ?  Birth control/protection: Post-menopausal  ?Other Topics Concern  ? Not on file  ?Social History Narrative  ? Not on file  ? ?Social Determinants of Health  ? ?Financial Resource Strain: Not on file  ?Food Insecurity: Not on file  ?Transportation Needs: Not on file  ?Physical Activity: Not on file  ?Stress: Not on file  ?Social Connections: Not on file  ?Intimate Partner Violence: Not on file  ? ?History reviewed. No pertinent family history. ? ?Current Outpatient Medications  ?Medication Sig Dispense Refill  ? amLODipine (NORVASC) 10 MG  tablet Take 1 tablet (10 mg total) by mouth daily. 90 tablet 1  ? aspirin EC 81 MG tablet Take 81 mg by mouth daily.    ? CALCIUM-VITAMIN D PO Take 1 tablet by mouth daily.    ? Cholecalciferol (VITAMIN D3 PO) Take 1 capsule by mouth daily.    ? cilostazol (PLETAL) 100 MG tablet Take 1 tablet (100 mg total) by mouth 2 (two) times daily before a meal. 180 tablet 3  ? cyclobenzaprine (FLEXERIL) 10 MG tablet Take 10 mg by mouth 2 (two) times daily.    ? FARXIGA 5 MG TABS tablet Take 5 mg by mouth in the morning.    ? ferrous sulfate 325 (65 FE) MG EC tablet Take 325 mg by mouth daily with breakfast.    ? glipiZIDE (GLUCOTROL XL) 5 MG 24 hr tablet Take 5 mg by mouth in the morning.    ? hydrALAZINE (APRESOLINE) 50 MG tablet Take 50 mg by mouth 3 (three) times daily.    ? HYDROcodone-acetaminophen (NORCO/VICODIN) 5-325 MG tablet Take 1 tablet by mouth daily as needed for moderate pain. 30 tablet 0  ? levocetirizine (XYZAL) 5 MG tablet Take 5 mg by mouth daily as needed for allergies.    ? LINZESS 145 MCG CAPS capsule Take 145 mcg by mouth daily.    ? metoprolol tartrate (LOPRESSOR)  100 MG tablet Take 1 tablet (100 mg total) by mouth 2 (two) times daily. 90 tablet 1  ? niacin (NIASPAN) 500 MG CR tablet Take 1 tablet (500 mg total) by mouth daily. 90 tablet 1  ? omeprazole (PRILOSEC) 2

## 2021-05-21 NOTE — Telephone Encounter (Signed)
RX was resent

## 2021-05-21 NOTE — Telephone Encounter (Signed)
Pt states pharmacy has put her medication back because pt never picked up. Pt states she was using up the Ambien she already had first. Pharmacy now needs this to be reordered. Please advise ? ?MEDICATION:Suvorexant (BELSOMRA) 20 MG TABS ? ?PHARMACY: ?[id number] [place], [place] Phone:  [phone]  ?Fax:  [phone]  ?  ? ? ?

## 2021-05-22 ENCOUNTER — Ambulatory Visit (HOSPITAL_COMMUNITY)
Admission: RE | Admit: 2021-05-22 | Discharge: 2021-05-22 | Disposition: A | Discharge status: Home / Self Care | Payer: Medicare Other | Source: Ambulatory Visit | Attending: Vascular Surgery | Admitting: Vascular Surgery

## 2021-05-22 ENCOUNTER — Ambulatory Visit (INDEPENDENT_AMBULATORY_CARE_PROVIDER_SITE_OTHER): Payer: Medicare Other | Admitting: Physician Assistant

## 2021-05-22 VITALS — BP 125/63 | HR 60 | Temp 97.0°F | Resp 18 | Ht 61.0 in | Wt 142.9 lb

## 2021-05-22 DIAGNOSIS — I739 Peripheral vascular disease, unspecified: Secondary | ICD-10-CM | POA: Diagnosis not present

## 2021-05-22 DIAGNOSIS — Z9889 Other specified postprocedural states: Secondary | ICD-10-CM | POA: Diagnosis not present

## 2021-05-23 ENCOUNTER — Other Ambulatory Visit: Payer: Self-pay | Admitting: Family

## 2021-05-23 ENCOUNTER — Other Ambulatory Visit: Payer: Self-pay

## 2021-05-23 DIAGNOSIS — G4700 Insomnia, unspecified: Secondary | ICD-10-CM

## 2021-05-23 MED ORDER — BELSOMRA 20 MG PO TABS: 1.0000 | ORAL_TABLET | Freq: Every evening | ORAL | 1 refills | Status: DC | PRN

## 2021-05-23 NOTE — Telephone Encounter (Signed)
RX resent, thx

## 2021-05-23 NOTE — Telephone Encounter (Signed)
Can you please re send this medication to [id number] - [place], [place].  ? ?Patient states they have not received it. Patient is out of medication.  ?

## 2021-05-28 ENCOUNTER — Telehealth: Payer: Self-pay | Admitting: Family

## 2021-05-28 ENCOUNTER — Other Ambulatory Visit: Payer: Self-pay

## 2021-05-28 ENCOUNTER — Other Ambulatory Visit: Payer: Self-pay | Admitting: *Deleted

## 2021-05-28 DIAGNOSIS — G4700 Insomnia, unspecified: Secondary | ICD-10-CM

## 2021-05-28 DIAGNOSIS — Z9889 Other specified postprocedural states: Secondary | ICD-10-CM

## 2021-05-28 DIAGNOSIS — I739 Peripheral vascular disease, unspecified: Secondary | ICD-10-CM

## 2021-05-28 DIAGNOSIS — F5101 Primary insomnia: Secondary | ICD-10-CM

## 2021-05-28 NOTE — Telephone Encounter (Signed)
.. ?  Encourage patient to contact the pharmacy for refills or they can request refills through [facility] ? ?LAST APPOINTMENT DATE:  05/01/21 ? ?NEXT APPOINTMENT DATE: 07/31/21 ? ?MEDICATION:Cholecalciferol (VITAMIN D3 PO ? ?Is the patient out of medication?  ? ?PHARMACY: ?[id number] [place], [place] Phone:  [phone]  ?Fax:  [phone]  ?  ? ? ?Let patient know to contact pharmacy at the end of the day to make sure medication is ready. ? ?Please notify patient to allow 48-72 hours to process  ?

## 2021-05-28 NOTE — Telephone Encounter (Signed)
?  Patient ?Name: ?Joanne HAN ?Morris ?Gender: Female ?DOB: 1950-03-16 ?Age: 71 Y 61 M 12 D ?Return ?Phone ?Number: ?[phone] ?(Primary) ?Address: ?City/ ?State/ ?Zip: ?[place] ?[address] ?Client Section at [place] [facility] - ?Clie ?Presenter, broadcasting at [place] [facility] ?Contact Type Call ?Who Is Calling Patient / Member / Family / Caregiver ?Call Type Triage / Clinical ?Relationship To Patient Self ?Return Phone Number [phone] (Primary) ?Chief Complaint Insomnia ?Reason for Call Symptomatic / Request for Health Information ?Initial Comment Caller states she would like to advise provider her ?sleep medication is not working and she is going ?back to the previous medication. ?Translation No ?Nurse Assessment ?Nurse: Colon, RN, Basilia Jumbo Date/Time (Eastern Time): 05/26/2021 4:58:12 AM ?Confirm and document reason for call. If ?symptomatic, describe symptoms. ?---Caller states she would like to advise provider her ?sleep medication is not working and she is going ?back to the previous medication. The med that is not ?working is called Belsomra 200mg , took first dose ?yesterday. Awake all night. Previous medicationZolpidem ER, not covered by insurance. ?Does the patient have any new or worsening ?symptoms? ---Yes ?Will a triage be completed? ---Yes ?Related visit to physician within the last 2 weeks? ---Yes ?Does the PT have any chronic conditions? (i.e. ?diabetes, asthma, this includes High risk factors for ?pregnancy, etc.) ?---Yes ?List chronic conditions. ---HTN DM ?Is this a behavioral health or substance abuse call? ---No ?Guidelines ?Guideline Title Affirmed Question Affirmed Notes Nurse Date/Time (Eastern ?Time) ?Insomnia Insomnia is an ?ongoing problem (> 2 ?weeks) ?Colon, RN, [facility] 05/26/2021 5:04:14 ?AM ?Disp. Time (Eastern ?Time) Disposition Final User ?05/26/2021 5:08:55 AM See PCP within 2 Weeks Yes Colon, RN, Rosa ?Caller Disagree/Comply Comply ?Caller Understands  Yes ?PreDisposition Did not know what to do ?Care Advice Given Per Guideline ?SEE PCP WITHIN 2 WEEKS: * You need to be seen for this ongoing problem within the next 2 weeks. * PCP VISIT: Call your ?doctor (or NP/PA) during regular office hours and make an appointment. CARE ADVICE given per Insomnia (Adult) guideline. * ?You become worse CALL BACK IF: * Avoid caffeine within 6 hours of bedtime. TIPS FOR GOOD SLEEP - WHAT TO AVOID: * ?Avoid smartphone use within 1 hour of bedtime. * Keep bedroom quiet and dark. TIPS FOR GOOD SLEEP - YOUR BEDROOM: ?Comments ?User: Basilia Jumbo, Colon, RN Date/Time Eilene Ghazi Time): 05/26/2021 5:10:23 AM ?caller states Belsomra not effective. She will go to pharmacy to fill Zolpidem which she has to pay out of pocket ?for. According to her, she has refills. Advised if any issues, she will have to call the office on Monday. Caller ?understood. ?Referrals ?REFERRED TO PCP OFFICE ?

## 2021-05-28 NOTE — Telephone Encounter (Signed)
Pt called in to state she is wanting to go back onto Ambien. She stated she has not gotten any sleep at all. Please advise ?

## 2021-05-28 NOTE — Telephone Encounter (Signed)
Pt states the new meds she was put on keep her awake. She is asking to be put back on her old meds. She called the after hours line. ?

## 2021-05-28 NOTE — Telephone Encounter (Signed)
PT called back and restated previous messages. ? ?CSR CM did verbally instruct pt that it takes 3-5 business days to complete.  ? ?Will schedule an appointment if pt calls back again. ?

## 2021-05-29 ENCOUNTER — Other Ambulatory Visit: Payer: Self-pay

## 2021-05-29 DIAGNOSIS — E559 Vitamin D deficiency, unspecified: Secondary | ICD-10-CM

## 2021-05-29 MED ORDER — VITAMIN D3 50 MCG (2000 UT) PO CAPS: 2000.0000 [IU] | ORAL_CAPSULE | Freq: Every day | ORAL | 1 refills | Status: DC

## 2021-05-29 NOTE — Telephone Encounter (Signed)
Patient is calling back again, about message below she would like a call back.  ?

## 2021-05-29 NOTE — Telephone Encounter (Signed)
Spoke with patient.

## 2021-05-29 NOTE — Telephone Encounter (Signed)
Patient states she needs to go back to Ambien sleeping medication- her new sleeping medication is not letting her sleep.-  Patient was advised it takes 3-5 business days for refills to be called in.  ? ?*patient needs a call re medication * ?

## 2021-05-30 ENCOUNTER — Telehealth: Payer: Self-pay | Admitting: Family

## 2021-05-30 MED ORDER — ZOLPIDEM TARTRATE ER 12.5 MG PO TBCR: 12.5000 mg | EXTENDED_RELEASE_TABLET | Freq: Every day | ORAL | 2 refills | Status: DC

## 2021-05-30 NOTE — Telephone Encounter (Signed)
Called and spoke with pt

## 2021-05-30 NOTE — Addendum Note (Signed)
Addended byJeanie Sewer on: 05/30/2021 01:14 PM ? ? Modules accepted: Orders ? ?

## 2021-05-30 NOTE — Telephone Encounter (Signed)
Pt is calling in regards to a coupon for her Ambien. She states she had spoken to someone in our office about it. Please advise ?

## 2021-05-30 NOTE — Telephone Encounter (Signed)
Patient ?Name: ?Joanne Morris ?[place] ?Gender: Female ?DOB: [birth year] ?Age: 71 Y 51 M 15 D ?Return ?Phone ?Number: ?[id number] ?(Primary) ?Address: ?City/ ?State/ ?Zip: ?[place] ?[address] ?Client [place] [facility] [place] [facility] - ?Clie ?Presenter, broadcasting at [place] [facility] ?Contact Type Call ?Who Is Calling Patient / Member / Family / Caregiver ?Call Type Triage / Clinical ?Relationship To Patient Self ?Return Phone Number [phone] (Primary) ?Chief Complaint Prescription Refill or Medication Request (non ?symptomatic) ?Reason for Call Symptomatic / Request for Health Information ?Initial Comment Caller states that she is calling to see if her ?prescriptions were called into the pharmacy as she ?is out of them at this time ?Translation No ?Nurse Assessment ?Nurse: Nyoka Cowden, RN, Tanika Date/Time (Eastern Time): 05/29/2021 6:30:50 PM ?Confirm and document reason for call. If ?symptomatic, describe symptoms. ?---Caller states that she is calling about her ?prescriptions because she is out. d3 vitamins 2000u ?zolpidem tartrate 12.5mg - states that she needs her ?sleeping medication changed, also states that she is ?unable to go to pharm tonight ?Does the patient have any new or worsening ?symptoms? ---Yes ?Will a triage be completed? ---Yes ?Related visit to physician within the last 2 weeks? ---Yes ?Does the PT have any chronic conditions? (i.e. ?diabetes, asthma, this includes High risk factors for ?pregnancy, etc.) ?---Yes ?List chronic conditions. ---diabetic, hyperlipidemia, htn, cardiac stent, leg stent ?Is this a behavioral health or substance abuse call? ---No ?Guidelines ?Guideline Title Affirmed Question Affirmed Notes Nurse Date/Time (Eastern ?Time) ?Insomnia Insomnia interferes ?with work or school ?Nyoka Cowden, RN, [place] 05/29/2021 6:36:29 ?PM ?Disp. Time (Eastern ?Time) Disposition Final User ?05/29/2021 6:03:42 PM Send To Nurse Graylon Gunning, RN, Rhonda ?Disp. Time (Eastern ?Time)  Disposition Final User ?05/29/2021 6:20:11 PM Attempt made - message left Nyoka Cowden, RN, Ludger [place] ?05/29/2021 6:42:08 PM SEE PCP WITHIN 3 DAYS Yes Nyoka Cowden, RN, Tanika ?Caller Disagree/Comply Comply ?Caller Understands Yes ?PreDisposition Call Doctor ?Care Advice Given Per Guideline ?SEE PCP WITHIN 3 DAYS: * You need to be seen within 2 or 3 days. * Take a warm bath or shower before bedtime. * Drink a ?small glass of warm milk at bedtime. TIPS FOR GOOD SLEEP: * Keep bedroom temperature cool, not warm or cold. TIPS FOR ?GOOD SLEEP - YOUR BEDROOM: CARE ADVICE given per Insomnia (Adult) guideline. * You become worse CALL BACK IF: ?Referrals ?REFERRED TO PCP OFFICE ?

## 2021-06-01 ENCOUNTER — Telehealth: Payer: Self-pay

## 2021-06-01 DIAGNOSIS — G894 Chronic pain syndrome: Secondary | ICD-10-CM

## 2021-06-01 NOTE — Telephone Encounter (Signed)
..   Encourage patient to contact the pharmacy for refills or they can request refills through [place]: 05/01/21  NEXT APPOINTMENT DATE: 07/31/21  MEDICATION: hydrocodone  Is the patient out of medication?   PHARMACY: [facility] - [address] [place]   Let patient know to contact pharmacy at the end of the day to make sure medication is ready.  Please notify patient to allow 48-72 hours to process

## 2021-06-03 MED ORDER — HYDROCODONE-ACETAMINOPHEN 5-325 MG PO TABS
1.0000 | ORAL_TABLET | Freq: Every day | ORAL | 0 refills | Status: DC | PRN
Start: 2021-06-03 — End: 2021-06-05

## 2021-06-03 NOTE — Telephone Encounter (Signed)
RX sent

## 2021-06-04 NOTE — Telephone Encounter (Signed)
Patient is calling in.    Had originally asked for script to go to [facility] [address]  [facility]  [facility] in [facility]  [facility]    Give patient a call at [phone].

## 2021-06-05 MED ORDER — HYDROCODONE-ACETAMINOPHEN 5-325 MG PO TABS: 1.0000 | ORAL_TABLET | Freq: Every day | ORAL | 0 refills | Status: DC | PRN

## 2021-06-05 NOTE — Telephone Encounter (Signed)
RX sent again

## 2021-06-05 NOTE — Addendum Note (Signed)
Addended byJeanie Sewer on: 06/05/2021 12:09 AM ? ? Modules accepted: Orders ? ?

## 2021-06-13 ENCOUNTER — Other Ambulatory Visit: Payer: Self-pay

## 2021-06-13 ENCOUNTER — Telehealth: Payer: Self-pay

## 2021-06-13 MED ORDER — FARXIGA 5 MG PO TABS: 5.0000 mg | ORAL_TABLET | Freq: Every morning | ORAL | 2 refills | Status: DC

## 2021-06-13 NOTE — Telephone Encounter (Signed)
..   Encourage patient to contact the pharmacy for refills or they can request refills through [place]: 05/01/2021  NEXT APPOINTMENT DATE: 07/31/2021  MEDICATION:  farxiga 5 mg  Is the patient out of medication?   [facility] [address]   Let patient know to contact pharmacy at the end of the day to make sure medication is ready.  Please notify patient to allow 48-72 hours to process  CLINICAL FILLS OUT ALL BELOW:   LAST REFILL:  QTY:  REFILL DATE:    OTHER COMMENTS:    Okay for refill?  Please advise

## 2021-06-13 NOTE — Telephone Encounter (Signed)
RX sent

## 2021-06-25 ENCOUNTER — Telehealth: Payer: Self-pay | Admitting: Family

## 2021-06-25 NOTE — Telephone Encounter (Signed)
.. ?  Encourage patient to contact the pharmacy for refills or they can request refills through [facility] ? ?LAST APPOINTMENT DATE:   ?05/01/21 ? ? ?NEXT APPOINTMENT DATE: ?07/31/21 ? ? ?MEDICATION: ?zolpidem (AMBIEN CR) 12.5 MG CR tablet [[id number]]  ? ?Is the patient out of medication?  ?Has 3 left ? ? ?[id number] [place]  ?[place], [place] [address]  ?Phone:  [phone]  Fax:  [phone]  ? ?Let patient know to contact pharmacy at the end of the day to make sure medication is ready. ? ?Please notify patient to allow 48-72 hours to process  ?

## 2021-06-25 NOTE — Telephone Encounter (Signed)
Did Joanne Morris call pharmacy first? I sent it last month with 2 refills. thx

## 2021-06-25 NOTE — Telephone Encounter (Signed)
Let pt know to check with pharmacy. Pt has 2 refills. Pt gave a verbal understanding.  ?

## 2021-06-26 NOTE — Telephone Encounter (Signed)
[facility] and [facility] was sent to [facility]. She called [facility] They told her the provider would have to order it [facility] and [facility]. Please advise ?

## 2021-06-26 NOTE — Telephone Encounter (Signed)
I called and let pt know her RX is at the pharmacy and ready for pick up.  ?

## 2021-06-26 NOTE — Telephone Encounter (Signed)
error 

## 2021-06-27 DIAGNOSIS — I358 Other nonrheumatic aortic valve disorders: Secondary | ICD-10-CM | POA: Diagnosis not present

## 2021-06-27 DIAGNOSIS — Z955 Presence of coronary angioplasty implant and graft: Secondary | ICD-10-CM | POA: Diagnosis not present

## 2021-06-27 DIAGNOSIS — E785 Hyperlipidemia, unspecified: Secondary | ICD-10-CM | POA: Diagnosis not present

## 2021-06-27 DIAGNOSIS — I6523 Occlusion and stenosis of bilateral carotid arteries: Secondary | ICD-10-CM | POA: Diagnosis not present

## 2021-06-27 DIAGNOSIS — I2510 Atherosclerotic heart disease of native coronary artery without angina pectoris: Secondary | ICD-10-CM | POA: Diagnosis not present

## 2021-06-27 DIAGNOSIS — Z9889 Other specified postprocedural states: Secondary | ICD-10-CM | POA: Diagnosis not present

## 2021-06-27 DIAGNOSIS — I252 Old myocardial infarction: Secondary | ICD-10-CM | POA: Diagnosis not present

## 2021-06-29 ENCOUNTER — Other Ambulatory Visit: Payer: Self-pay

## 2021-06-29 ENCOUNTER — Telehealth: Payer: Self-pay

## 2021-06-29 MED ORDER — LEVOCETIRIZINE DIHYDROCHLORIDE 5 MG PO TABS: 5.0000 mg | ORAL_TABLET | Freq: Every day | ORAL | 1 refills | Status: DC | PRN

## 2021-06-29 NOTE — Telephone Encounter (Signed)
ok to refill 90 pills, 1 refill, thnaks

## 2021-06-29 NOTE — Progress Notes (Deleted)
  Office Note     Patient did not show up for appointment or appointment was cancelled

## 2021-06-29 NOTE — Telephone Encounter (Signed)
..   Encourage patient to contact the pharmacy for refills or they can request refills through [place]:  02/27/2021  NEXT APPOINTMENT DATE: NA  MEDICATION:  xyzal  Is the patient out of medication?   [name]   Let patient know to contact pharmacy at the end of the day to make sure medication is ready.  Please notify patient to allow 48-72 hours to process  CLINICAL FILLS OUT ALL BELOW:   LAST REFILL:  QTY:  REFILL DATE:    OTHER COMMENTS:    Okay for refill?  Please advise

## 2021-07-03 ENCOUNTER — Telehealth: Payer: Self-pay | Admitting: Family

## 2021-07-03 ENCOUNTER — Encounter (HOSPITAL_COMMUNITY): Payer: Medicare Other

## 2021-07-03 ENCOUNTER — Ambulatory Visit: Payer: Medicare Other

## 2021-07-03 DIAGNOSIS — I739 Peripheral vascular disease, unspecified: Secondary | ICD-10-CM

## 2021-07-03 DIAGNOSIS — Z9889 Other specified postprocedural states: Secondary | ICD-10-CM

## 2021-07-03 NOTE — Telephone Encounter (Signed)
Pharmacy is asking for update on multiple Rx refills. Pharmacy will refax to [facility] today (05/23)

## 2021-07-08 ENCOUNTER — Other Ambulatory Visit: Payer: Self-pay | Admitting: Family

## 2021-07-12 ENCOUNTER — Telehealth: Payer: Self-pay | Admitting: Family

## 2021-07-12 DIAGNOSIS — G894 Chronic pain syndrome: Secondary | ICD-10-CM

## 2021-07-12 MED ORDER — HYDROCODONE-ACETAMINOPHEN 5-325 MG PO TABS: 1.0000 | ORAL_TABLET | Freq: Every day | ORAL | 0 refills | Status: DC | PRN

## 2021-07-12 NOTE — Telephone Encounter (Signed)
[facility] [place] since [facility] is what is listed above!

## 2021-07-12 NOTE — Telephone Encounter (Signed)
..   Encourage patient to contact the pharmacy for refills or they can request refills through [place]:  05/01/21  NEXT APPOINTMENT DATE: 07/31/21  MEDICATION:HYDROcodone-acetaminophen (NORCO/VICODIN) 5-325 MG tablet  Is the patient out of medication?   PHARMACY: [facility] [place], [place] AT [place] Phone:  [phone]  Fax:  [phone]      Let patient know to contact pharmacy at the end of the day to make sure medication is ready.  Please notify patient to allow 48-72 hours to process

## 2021-07-18 ENCOUNTER — Other Ambulatory Visit: Payer: Self-pay | Admitting: Family

## 2021-07-19 NOTE — Telephone Encounter (Signed)
Pharmacy called for check up. FO Rep had pharmacy refax, received and placed in providers box today.

## 2021-07-21 ENCOUNTER — Other Ambulatory Visit: Payer: Self-pay | Admitting: Family

## 2021-07-24 ENCOUNTER — Other Ambulatory Visit: Payer: Self-pay | Admitting: Physician Assistant

## 2021-07-24 ENCOUNTER — Other Ambulatory Visit: Payer: Self-pay

## 2021-07-24 ENCOUNTER — Telehealth: Payer: Self-pay | Admitting: Family

## 2021-07-24 DIAGNOSIS — G4700 Insomnia, unspecified: Secondary | ICD-10-CM

## 2021-07-24 MED ORDER — ZOLPIDEM TARTRATE ER 12.5 MG PO TBCR: 12.5000 mg | EXTENDED_RELEASE_TABLET | Freq: Every day | ORAL | 2 refills | Status: DC

## 2021-07-24 NOTE — Telephone Encounter (Signed)
   LAST APPOINTMENT DATE:   05/01/21  NEXT APPOINTMENT DATE: 07/31/21  MEDICATION: zolpidem (AMBIEN CR) 12.5 MG CR tablet [[id number]]  Is the patient out of medication?  Yes, took final pill last night.  [id number] [place] [place] [address]  [place], [place] [address]  Phone:  [phone]  Fax:  [phone]

## 2021-07-24 NOTE — Telephone Encounter (Signed)
error 

## 2021-07-25 ENCOUNTER — Other Ambulatory Visit: Payer: Self-pay | Admitting: Physician Assistant

## 2021-07-25 DIAGNOSIS — G4700 Insomnia, unspecified: Secondary | ICD-10-CM

## 2021-07-25 MED ORDER — ZOLPIDEM TARTRATE ER 12.5 MG PO TBCR: 12.5000 mg | EXTENDED_RELEASE_TABLET | Freq: Every day | ORAL | 2 refills | Status: DC

## 2021-07-25 NOTE — Telephone Encounter (Signed)
Patient has called back stating that pharmacy does not have script.  [facility] [address]  Is requesting follow up call.

## 2021-07-25 NOTE — Telephone Encounter (Signed)
Please see note.

## 2021-07-25 NOTE — Telephone Encounter (Signed)
Pt states the PA should be sent to the following fax number [phone].

## 2021-07-25 NOTE — Telephone Encounter (Signed)
I called and spoke with pt, Pt stated she will go ahead and pay the pharmacy for the medication. Pharmacy let pt know her insurance cover Ambien 10mg . Pt refused.

## 2021-07-25 NOTE — Telephone Encounter (Signed)
Pt states she cannot afford rx, Ambien. She stated to call [phone] to start a PA. Please advise

## 2021-07-27 NOTE — Telephone Encounter (Signed)
FYI-- Patient called stating she decided to have all of her medications sent to [facility] from now on. She states she was informed by the pharmacy that she would have to call to inform of this as well as to let us know to call them to iron out details about her medications. Pharmacy info is below.    [facility] ([facility] ) - [place], [place]  [address] [place] [address]  Phone:  [phone]  Fax:  [phone]  DEA #:  --

## 2021-07-27 NOTE — Telephone Encounter (Signed)
Patient requests to be called at ph# [phone] regarding Patient's medications and change of Pharmacy to [facility].

## 2021-07-28 ENCOUNTER — Other Ambulatory Visit: Payer: Self-pay | Admitting: Family

## 2021-07-28 DIAGNOSIS — G4700 Insomnia, unspecified: Secondary | ICD-10-CM

## 2021-07-28 MED ORDER — ZOLPIDEM TARTRATE ER 12.5 MG PO TBCR: 12.5000 mg | EXTENDED_RELEASE_TABLET | Freq: Every day | ORAL | 1 refills | Status: DC

## 2021-07-30 NOTE — Progress Notes (Deleted)
Subjective:     Patient ID: Joanne Morris, female    DOB: [birth year], 71 y.o.   MRN: [id number]  No chief complaint on file.   HPI: Pain She reports chronic right side pain. was not an injury that may have caused the pain. The pain started about a year ago and is staying constant. The pain does not radiate. The pain is described as aching and soreness, is moderate in intensity, occurring intermittently. Symptoms are worse in the: evening  Aggravating factors: standing and walking She has tried application of heat, application of ice, NSAIDs, and prescription pain relievers with moderate relief.  INSOMNIA:  How long: years. Difficulty initiating sleep: yes.  Difficulty maintaining sleep: yes.  OTC meds tried: Benadryl, Melatonin. RX meds in past: Trazodone.  Sleep hygiene measures: yes. Started any new meds recently: no. Shift worker: no. New stressors: no. Currently taking Zolpidem qhs, working well, denies any SE. Pt reports insurance will no longer cover the Ambien. Assessment & Plan:   Problem List Items Addressed This Visit       Other   Chronic pain syndrome - Primary   Insomnia    Outpatient Medications Prior to Visit  Medication Sig Dispense Refill   amLODipine (NORVASC) 10 MG tablet Take 1 tablet (10 mg total) by mouth daily. 90 tablet 1   aspirin EC 81 MG tablet Take 81 mg by mouth daily.     CALCIUM-VITAMIN D PO Take 1 tablet by mouth daily.     Cholecalciferol (VITAMIN D3) 50 MCG (2000 UT) capsule Take 1 capsule (2,000 Units total) by mouth daily. 90 capsule 1   cilostazol (PLETAL) 100 MG tablet Take 1 tablet (100 mg total) by mouth 2 (two) times daily before a meal. 180 tablet 3   cyclobenzaprine (FLEXERIL) 10 MG tablet Take 10 mg by mouth 2 (two) times daily.     FARXIGA 5 MG TABS tablet Take 1 tablet (5 mg total) by mouth in the morning. 30 tablet 2   ferrous sulfate 325 (65 FE) MG EC tablet Take 325 mg by mouth daily with breakfast.     glipiZIDE (GLUCOTROL XL) 5 MG 24  hr tablet Take 5 mg by mouth in the morning.     hydrALAZINE (APRESOLINE) 50 MG tablet Take 50 mg by mouth 3 (three) times daily.     HYDROcodone-acetaminophen (NORCO/VICODIN) 5-325 MG tablet Take 1 tablet by mouth daily as needed for moderate pain. 30 tablet 0   levocetirizine (XYZAL) 5 MG tablet Take 1 tablet (5 mg total) by mouth daily as needed for allergies. 90 tablet 1   LINZESS 145 MCG CAPS capsule Take 145 mcg by mouth daily.     metoprolol tartrate (LOPRESSOR) 100 MG tablet TAKE 1 TABLET BY MOUTH TWICE  DAILY 180 tablet 3   niacin (NIASPAN) 500 MG CR tablet TAKE 1 TABLET BY MOUTH DAILY 100 tablet 2   omeprazole (PRILOSEC) 20 MG capsule Take 20 mg by mouth daily as needed (acid reflux).     polyethylene glycol powder (GLYCOLAX/MIRALAX) 17 GM/SCOOP powder Take 17 g by mouth daily as needed for constipation.     pravastatin (PRAVACHOL) 40 MG tablet Take 1 tablet (40 mg total) by mouth daily. 90 tablet 1   sertraline (ZOLOFT) 100 MG tablet Take 100 mg by mouth daily.     sertraline (ZOLOFT) 50 MG tablet Take 1 tablet (50 mg total) by mouth every evening. 90 tablet 1   valsartan (DIOVAN) 320 MG tablet TAKE 1 TABLET  BY MOUTH DAILY 100 tablet 2   Vitamin D, Ergocalciferol, (DRISDOL) 1.25 MG (50000 UNIT) CAPS capsule Take 50,000 Units by mouth every 7 (seven) days.     zolpidem (AMBIEN CR) 12.5 MG CR tablet Take 1 tablet (12.5 mg total) by mouth at bedtime. 90 tablet 1   No facility-administered medications prior to visit.    Past Medical History:  Diagnosis Date   Abdominal pain 10/11/2012   Anemia 05/31/2011   Angina    Anxiety    Arthritis    "knees" (07/20/2013)   Atypical chest pain 05/31/2011   Carotid stenosis, asymptomatic 04/18/2014   Claudication ([place])    COVID-19 virus infection 08/01/2018   Depression    GERD (gastroesophageal reflux disease)    Hematoma of neck 04/23/2014   High cholesterol    Hyperlipemia 05/31/2011   Hypokalemia 05/31/2011   Leucocytosis 05/31/2011    Myocardial infarction [facility]) 1990's   "1"   Neck pain on right side 10/04/2014   PAD (peripheral artery disease) ([place])    Peripheral arterial disease ([place]) 06/08/2013   Peripheral arterial disease   Radiculopathy of leg 10/11/2012   Stroke [facility])    "mini stroke 1st then regular stroke", denies residual on 07/20/2013   Wears glasses     Past Surgical History:  Procedure Laterality Date   ABDOMINAL HYSTERECTOMY     ATHERECTOMY  10/05/2013   BALLOON ANGIOPLASTY, ARTERY  10/05/2013   DR [name]   CAROTID ENDARTERECTOMY     CORONARY ANGIOPLASTY WITH STENT PLACEMENT     "1"   ENDARTERECTOMY Left 02/07/2014   Procedure: ENDARTERECTOMY CAROTID;  Surgeon: Elam Dutch, MD;  Location: [facility] OR;  Service: Vascular;  Laterality: Left;   ENDARTERECTOMY Right 04/18/2014   Procedure: ENDARTERECTOMY RIGHT CAROTID;  Surgeon: Elam Dutch, MD;  Location: [facility] OR;  Service: Vascular;  Laterality: Right;   ENDARTERECTOMY N/A 04/24/2014   Procedure: IRRIGATION AND DEBRIDEMENT OF RIGHT NECK ;  Surgeon: Elam Dutch, MD;  Location: [facility];  Service: Vascular;  Laterality: N/A;   ESOPHAGOGASTRODUODENOSCOPY N/A 10/13/2012   Procedure: ESOPHAGOGASTRODUODENOSCOPY (EGD);  Surgeon: Gatha Mayer, MD;  Location: [facility] ENDOSCOPY;  Service: Endoscopy;  Laterality: N/A;   KENALOG INJECTION Bilateral 08/22/2016   Procedure: KENALOG INJECTION BILATERAL NECK;  Surgeon: Wallace Going, DO;  Location: [facility];  Service: Plastics;  Laterality: Bilateral;   LOWER EXTREMITY ANGIOGRAM  07/20/2013   Unsuccessful attempt at crossing the CTO/notes 07/20/2013   LOWER EXTREMITY ANGIOGRAM N/A 07/06/2013   Procedure: LOWER EXTREMITY ANGIOGRAM;  Surgeon: Laverda Page, MD;  Location: [facility] CATH LAB;  Service: Cardiovascular;  Laterality: N/A;   LOWER EXTREMITY ANGIOGRAM N/A 07/20/2013   Procedure: LOWER EXTREMITY ANGIOGRAM;  Surgeon: Laverda Page, MD;  Location: [facility] CATH LAB;  Service: Cardiovascular;  Laterality: N/A;    LOWER EXTREMITY ANGIOGRAM N/A 10/05/2013   Procedure: LOWER EXTREMITY ANGIOGRAM;  Surgeon: Laverda Page, MD;  Location: [facility] CATH LAB;  Service: Cardiovascular;  Laterality: N/A;   MASS EXCISION Right 08/22/2016   Procedure: EXCISION RIGHT NECK KELOID;  Surgeon: Wallace Going, DO;  Location: [facility];  Service: Plastics;  Laterality: Right;   MASS EXCISION Right 09/18/2020   Procedure: Excision of right neck keloid;  Surgeon: Wallace Going, DO;  Location: [facility];  Service: Plastics;  Laterality: Right;   PATCH ANGIOPLASTY Left 02/07/2014   Procedure: PATCH ANGIOPLASTY Carotid;  Surgeon: Elam Dutch, MD;  Location: [facility];  Service: Vascular;  Laterality: Left;   PATCH ANGIOPLASTY Right 04/18/2014   Procedure: PATCH ANGIOPLASTY USING HEMASHIELD 0.8cmx 7.6cm PATCH;  Surgeon: Elam Dutch, MD;  Location: [facility];  Service: Vascular;  Laterality: Right;   PERIPHERAL VASCULAR CATHETERIZATION N/A 07/05/2014   Procedure: Lower Extremity Angiography;  Surgeon: [name], MD;  Location: [facility] CV LAB;  Service: Cardiovascular;  Laterality: N/A;   TUBAL LIGATION      No Known Allergies     Objective:    Physical Exam Vitals and nursing note reviewed.  Constitutional:      Appearance: Normal appearance.  Cardiovascular:     Rate and Rhythm: Normal rate and regular rhythm.  Pulmonary:     Effort: Pulmonary effort is normal.     Breath sounds: Normal breath sounds.  Musculoskeletal:        General: Normal range of motion.  Skin:    General: Skin is warm and dry.  Neurological:     Mental Status: She is alert.  Psychiatric:        Mood and Affect: Mood normal.        Behavior: Behavior normal.     There were no vitals taken for this visit. Wt Readings from Last 3 Encounters:  05/22/21 142 lb 14.4 oz (64.8 kg)  05/01/21 143 lb 3.2 oz (65 kg)  02/27/21 145 lb 3.2 oz (65.9 kg)        No orders of the defined types were placed in this  encounter.   [name], NP

## 2021-07-31 ENCOUNTER — Ambulatory Visit: Payer: Medicare Other | Admitting: Family

## 2021-07-31 DIAGNOSIS — G894 Chronic pain syndrome: Secondary | ICD-10-CM

## 2021-07-31 DIAGNOSIS — F5101 Primary insomnia: Secondary | ICD-10-CM

## 2021-08-01 NOTE — Telephone Encounter (Signed)
Patient requests that [facility] be called at ph# [phone] in order for Patient to receive her RX's. Patient states she is getting low on her medications.

## 2021-08-01 NOTE — Telephone Encounter (Signed)
I called and spoke with pt to let her know I have talked to her pharmacy in regards to her Rx's. I did a verbal order for her Valsartan and Niacin with pharmacy. Pharmacy stated she received her other medications on 07/31/21. Pt states she will see if they come today and if not give the pharmacy a call back.

## 2021-08-07 ENCOUNTER — Ambulatory Visit: Payer: Medicare Other | Admitting: Vascular Surgery

## 2021-08-07 ENCOUNTER — Encounter (HOSPITAL_COMMUNITY): Payer: Medicare Other

## 2021-08-09 ENCOUNTER — Other Ambulatory Visit: Payer: Self-pay | Admitting: Family

## 2021-08-09 DIAGNOSIS — I739 Peripheral vascular disease, unspecified: Secondary | ICD-10-CM

## 2021-08-10 ENCOUNTER — Ambulatory Visit (INDEPENDENT_AMBULATORY_CARE_PROVIDER_SITE_OTHER): Payer: Medicare Other

## 2021-08-10 DIAGNOSIS — E2839 Other primary ovarian failure: Secondary | ICD-10-CM | POA: Diagnosis not present

## 2021-08-10 DIAGNOSIS — Z0000 Encounter for general adult medical examination without abnormal findings: Secondary | ICD-10-CM | POA: Diagnosis not present

## 2021-08-10 DIAGNOSIS — Z1211 Encounter for screening for malignant neoplasm of colon: Secondary | ICD-10-CM | POA: Diagnosis not present

## 2021-08-10 DIAGNOSIS — Z1231 Encounter for screening mammogram for malignant neoplasm of breast: Secondary | ICD-10-CM

## 2021-08-10 NOTE — Progress Notes (Signed)
Virtual Visit via Telephone Note  I connected with  Joanne Morris on 08/10/21 at  8:45 AM EDT by telephone and verified that I am speaking with the correct person using two identifiers.  Medicare Annual Wellness visit completed telephonically due to Covid-19 pandemic.   Persons participating in this call: This Health Coach and this patient.   Location: Patient: Home Provider: Office    I discussed the limitations, risks, security and privacy concerns of performing an evaluation and management service by telephone and the availability of in person appointments. The patient expressed understanding and agreed to proceed.  Unable to perform video visit due to video visit attempted and failed and/or patient does not have video capability.   Some vital signs may be absent or patient reported.   Willette Brace, LPN   Subjective:   Joanne Morris is a 71 y.o. female who presents for an Initial Medicare Annual Wellness Visit.  Review of Systems     Cardiac Risk Factors include: advanced age (>35men, >51 women);diabetes mellitus;dyslipidemia;hypertension     Objective:    There were no vitals filed for this visit. There is no height or weight on file to calculate BMI.     08/10/2021    8:59 AM 09/14/2020    3:10 PM 08/18/2018    9:22 AM 08/01/2018    6:00 AM 11/06/2017    7:57 PM 08/22/2016    7:44 AM 08/15/2016   10:25 AM  Advanced Directives  Does Patient Have a Medical Advance Directive? No No No No No No No  Would patient like information on creating a medical advance directive? No - Patient declined Yes (MAU/Ambulatory/Procedural Areas - Information given) No - Patient declined No - Patient declined  No - Patient declined     Current Medications (verified) Outpatient Encounter Medications as of 08/10/2021  Medication Sig   amLODipine (NORVASC) 10 MG tablet Take 1 tablet (10 mg total) by mouth daily.   aspirin EC 81 MG tablet Take 81 mg by mouth daily.   CALCIUM-VITAMIN D PO Take 1  tablet by mouth daily.   Cholecalciferol (VITAMIN D3) 50 MCG (2000 UT) capsule Take 1 capsule (2,000 Units total) by mouth daily.   cilostazol (PLETAL) 100 MG tablet Take 1 tablet (100 mg total) by mouth 2 (two) times daily before a meal.   cyclobenzaprine (FLEXERIL) 10 MG tablet Take 10 mg by mouth 2 (two) times daily.   FARXIGA 5 MG TABS tablet Take 1 tablet (5 mg total) by mouth in the morning.   ferrous sulfate 325 (65 FE) MG EC tablet Take 325 mg by mouth daily with breakfast.   glipiZIDE (GLUCOTROL XL) 5 MG 24 hr tablet Take 5 mg by mouth in the morning.   hydrALAZINE (APRESOLINE) 50 MG tablet Take 50 mg by mouth 3 (three) times daily.   HYDROcodone-acetaminophen (NORCO/VICODIN) 5-325 MG tablet Take 1 tablet by mouth daily as needed for moderate pain.   levocetirizine (XYZAL) 5 MG tablet Take 1 tablet (5 mg total) by mouth daily as needed for allergies.   LINZESS 145 MCG CAPS capsule Take 145 mcg by mouth daily.   metoprolol tartrate (LOPRESSOR) 100 MG tablet TAKE 1 TABLET BY MOUTH TWICE  DAILY   niacin (NIASPAN) 500 MG CR tablet TAKE 1 TABLET BY MOUTH DAILY   omeprazole (PRILOSEC) 20 MG capsule Take 20 mg by mouth daily as needed (acid reflux).   polyethylene glycol powder (GLYCOLAX/MIRALAX) 17 GM/SCOOP powder Take 17 g by mouth daily as needed  for constipation.   pravastatin (PRAVACHOL) 40 MG tablet Take 1 tablet (40 mg total) by mouth daily.   sertraline (ZOLOFT) 100 MG tablet Take 100 mg by mouth daily.   sertraline (ZOLOFT) 50 MG tablet Take 1 tablet (50 mg total) by mouth every evening.   valsartan (DIOVAN) 320 MG tablet TAKE 1 TABLET BY MOUTH DAILY   Vitamin D, Ergocalciferol, (DRISDOL) 1.25 MG (50000 UNIT) CAPS capsule Take 50,000 Units by mouth every 7 (seven) days.   zolpidem (AMBIEN CR) 12.5 MG CR tablet Take 1 tablet (12.5 mg total) by mouth at bedtime.   No facility-administered encounter medications on file as of 08/10/2021.    Allergies (verified) Patient has no known  allergies.   History: Past Medical History:  Diagnosis Date   Abdominal pain 10/11/2012   Anemia 05/31/2011   Angina    Anxiety    Arthritis    "knees" (07/20/2013)   Atypical chest pain 05/31/2011   Carotid stenosis, asymptomatic 04/18/2014   Claudication ([place])    COVID-19 virus infection 08/01/2018   Depression    GERD (gastroesophageal reflux disease)    Hematoma of neck 04/23/2014   High cholesterol    Hyperlipemia 05/31/2011   Hypokalemia 05/31/2011   Leucocytosis 05/31/2011   Myocardial infarction [facility]) 1990's   "1"   Neck pain on right side 10/04/2014   PAD (peripheral artery disease) ([place])    Peripheral arterial disease ([place]) 06/08/2013   Peripheral arterial disease   Radiculopathy of leg 10/11/2012   Stroke [facility])    "mini stroke 1st then regular stroke", denies residual on 07/20/2013   Wears glasses    Past Surgical History:  Procedure Laterality Date   ABDOMINAL HYSTERECTOMY     ATHERECTOMY  10/05/2013   BALLOON ANGIOPLASTY, ARTERY  10/05/2013   DR [name]   CAROTID ENDARTERECTOMY     CORONARY ANGIOPLASTY WITH STENT PLACEMENT     "1"   ENDARTERECTOMY Left 02/07/2014   Procedure: ENDARTERECTOMY CAROTID;  Surgeon: Elam Dutch, MD;  Location[facility];  Service: Vascular;  Laterality: Left;   ENDARTERECTOMY Right 04/18/2014   Procedure: ENDARTERECTOMY RIGHT CAROTID;  Surgeon: Elam Dutch, MD;  Location: [facility] OR;  Service: Vascular;  Laterality: Right;   ENDARTERECTOMY N/A 04/24/2014   Procedure: IRRIGATION AND DEBRIDEMENT OF RIGHT NECK ;  Surgeon: Elam Dutch, MD;  Location: [place];  Service: Vascular;  Laterality: N/A;   ESOPHAGOGASTRODUODENOSCOPY N/A 10/13/2012   Procedure: ESOPHAGOGASTRODUODENOSCOPY (EGD);  Surgeon: Gatha Mayer, MD;  [facility] ENDOSCOPY;  Service: Endoscopy;  Laterality: N/A;   KENALOG INJECTION Bilateral 08/22/2016   Procedure: KENALOG INJECTION BILATERAL NECK;  Surgeon: Wallace Going, DO;  Location: [place];  Service:  Plastics;  Laterality: Bilateral;   LOWER EXTREMITY ANGIOGRAM  07/20/2013   Unsuccessful attempt at crossing the CTO/notes 07/20/2013   LOWER EXTREMITY ANGIOGRAM N/A 07/06/2013   Procedure: LOWER EXTREMITY ANGIOGRAM;  Surgeon: Laverda Page, MD;  Location: [facility] CATH LAB;  Service: Cardiovascular;  Laterality: N/A;   LOWER EXTREMITY ANGIOGRAM N/A 07/20/2013   Procedure: LOWER EXTREMITY ANGIOGRAM;  Surgeon: Laverda Page, MD;  Location: [facility] CATH LAB;  Service: Cardiovascular;  Laterality: N/A;   LOWER EXTREMITY ANGIOGRAM N/A 10/05/2013   Procedure: LOWER EXTREMITY ANGIOGRAM;  Surgeon: Laverda Page, MD;  Location: [facility] CATH LAB;  Service: Cardiovascular;  Laterality: N/A;   MASS EXCISION Right 08/22/2016   Procedure: EXCISION RIGHT NECK KELOID;  Surgeon: Wallace Going, DO;  Location: [facility];  Service: Clinical cytogeneticist;  Laterality: Right;   MASS EXCISION Right 09/18/2020   Procedure: Excision of right neck keloid;  Surgeon: Wallace Going, DO;  Location: [place];  Service: Plastics;  Laterality: Right;   PATCH ANGIOPLASTY Left 02/07/2014   Procedure: PATCH ANGIOPLASTY Carotid;  Surgeon: Elam Dutch, MD;  Location: [place];  Service: Vascular;  Laterality: Left;   PATCH ANGIOPLASTY Right 04/18/2014   Procedure: PATCH ANGIOPLASTY USING HEMASHIELD 0.8cmx 7.6cm PATCH;  Surgeon: Elam Dutch, MD;  Location: [place];  Service: Vascular;  Laterality: Right;   PERIPHERAL VASCULAR CATHETERIZATION N/A 07/05/2014   Procedure: Lower Extremity Angiography;  Surgeon: [name], MD;  Location: [place] CV LAB;  Service: Cardiovascular;  Laterality: N/A;   TUBAL LIGATION     History reviewed. No pertinent family history. Social History   Socioeconomic History   Marital status: Legally Separated    Spouse name: Not on file   Number of children: Not on file   Years of education: Not on file   Highest education level: Not on file  Occupational History   Not on file  Tobacco Use    Smoking status: Former    Packs/day: 0.50    Years: 4.00    Total pack years: 2.00    Types: Cigarettes    Quit date: 05/30/2009    Years since quitting: 12.2   Smokeless tobacco: Never  Vaping Use   Vaping Use: Never used  Substance and Sexual Activity   Alcohol use: No    Alcohol/week: 0.0 standard drinks of alcohol   Drug use: No   Sexual activity: Yes    Birth control/protection: Post-menopausal  Other Topics Concern   Not on file  Social History Narrative   Not on file   Social Determinants of Health   Financial Resource Strain: Low Risk  (08/10/2021)   Overall Financial Resource Strain (CARDIA)    Difficulty of Paying Living Expenses: Not hard at all  Food Insecurity: No Food Insecurity (08/10/2021)   Hunger Vital Sign    Worried About Running Out of Food in the Last Year: Never true    Ran Out of Food in the Last Year: Never true  Transportation Needs: No Transportation Needs (08/10/2021)   PRAPARE - Hydrologist (Medical): No    Lack of Transportation (Non-Medical): No  Physical Activity: Insufficiently Active (08/10/2021)   Exercise Vital Sign    Days of Exercise per Week: 2 days    Minutes of Exercise per Session: 60 min  Stress: No Stress Concern Present (08/10/2021)   [place]    Feeling of Stress : Not at all  Social Connections: Moderately Isolated (08/10/2021)   Social Connection and Isolation Panel [NHANES]    Frequency of Communication with Friends and Family: More than three times a week    Frequency of Social Gatherings with Friends and Family: More than three times a week    Attends Religious Services: More than 4 times per year    Active Member of [employer] or Organizations: No    Attends Archivist Meetings: Never    Marital Status: Separated    Tobacco Counseling Counseling given: Not Answered   Clinical Intake:  Pre-visit preparation  completed: Yes  Pain : No/denies pain     BMI - recorded: 27.01 Nutritional Status: BMI 25 -29 Overweight Nutritional Risks: None Diabetes: Yes CBG done?: No Did pt. bring in CBG  monitor from home?: No  How often do you need to have someone help you when you read instructions, pamphlets, or other written materials from your doctor or pharmacy?: 1 - Never  Diabetic?Nutrition Risk Assessment:  Has the patient had any N/V/D within the last 2 months?  No  Does the patient have any non-healing wounds?  No  Has the patient had any unintentional weight loss or weight gain?  No   Diabetes:  Is the patient diabetic?  Yes  If diabetic, was a CBG obtained today?  No  Did the patient bring in their glucometer from home?  No  How often do you monitor your CBG's? daily.   Financial Strains and Diabetes Management:  Are you having any financial strains with the device, your supplies or your medication? No .  Does the patient want to be seen by Chronic Care Management for management of their diabetes?  No  Would the patient like to be referred to a Nutritionist or for Diabetic Management?  No   Diabetic Exams:  Diabetic Eye Exam: Overdue for diabetic eye exam. Pt has been advised about the importance in completing this exam. Patient advised to call and schedule an eye exam. Diabetic Foot Exam: Overdue, Pt has been advised about the importance in completing this exam. Pt is scheduled for diabetic foot exam on next appt .    Interpreter Needed?: No  Information entered by :: Tinas Audiel Scheiber, LPN   Activities of Daily Living    08/10/2021    9:00 AM 09/14/2020    3:15 PM  In your present state of health, do you have any difficulty performing the following activities:  Hearing? 0   Vision? 0   Difficulty concentrating or making decisions? 0   Walking or climbing stairs? 0   Dressing or bathing? 0   Doing errands, shopping? 0 0  Preparing Food and eating ? N   Using the Toilet? N    In the past six months, have you accidently leaked urine? N   Do you have problems with loss of bowel control? N   Managing your Medications? N   Managing your Finances? N   Housekeeping or managing your Housekeeping? N     Patient Care Team: [name], NP as PCP - General (Family Medicine) Talbert Cage, NP as Nurse Practitioner Marty Heck, MD as Consulting Physician (Vascular Surgery) Patrecia Pour, MD as Attending Physician (Family Medicine)  Indicate any recent Medical Services you may have received from other than [facility] providers in the past year (date may be approximate).     Assessment:   This is a routine wellness examination for [place].  Hearing/Vision screen Hearing Screening - Comments:: Pt denies any hearing issues  Vision Screening - Comments:: Pt follows up with [facility] for annual eye exams   Dietary issues and exercise activities discussed: Current Exercise Habits: Home exercise routine, Type of exercise: Other - see comments, Time (Minutes): 60, Frequency (Times/Week): 2, Weekly Exercise (Minutes/Week): 120   Goals Addressed             This Visit's Progress    Patient Stated       None at this time       Depression Screen    08/10/2021    8:58 AM 12/26/2020    9:04 AM  PHQ 2/9 Scores  PHQ - 2 Score 0 0    Fall Risk    08/10/2021    9:00 AM 02/27/2021  9:02 AM 12/26/2020    9:04 AM  Fall Risk   Falls in the past year? 0 0 0  Number falls in past yr: 0    Injury with Fall? 0    Risk for fall due to : Impaired vision    Follow up Falls prevention discussed      FALL RISK PREVENTION PERTAINING TO THE HOME:  Any stairs in or around the home? No  If so, are there any without handrails? No  Home free of loose throw rugs in walkways, pet beds, electrical cords, etc? Yes  Adequate lighting in your home to reduce risk of falls? Yes   ASSISTIVE DEVICES UTILIZED TO PREVENT FALLS:  Life alert? No  Use of a cane,  walker or w/c? Yes  Grab bars in the bathroom? Yes  Shower chair or bench in shower? No  Elevated toilet seat or a handicapped toilet? No   TIMED UP AND GO:  Was the test performed? No .   Cognitive Function:        08/10/2021    9:01 AM  6CIT Screen  What Year? 0 points  What month? 0 points  What time? 0 points  Count back from 20 0 points  Months in reverse 2 points  Repeat phrase 10 points  Total Score 12 points    Immunizations Immunization History  Administered Date(s) Administered   Fluad Quad(high Dose 65+) 12/26/2020   Influenza-Unspecified 12/26/2020   Moderna Sars-Covid-2 Vaccination 03/06/2020, 04/03/2020   Pneumococcal Polysaccharide-23 07/21/2013    TDAP status: Due, Education has been provided regarding the importance of this vaccine. Advised may receive this vaccine at local pharmacy or Health Dept. Aware to provide a copy of the vaccination record if obtained from local pharmacy or Health Dept. Verbalized acceptance and understanding.  Flu Vaccine status: Up to date  Pneumococcal vaccine status: Due, Education has been provided regarding the importance of this vaccine. Advised may receive this vaccine at local pharmacy or Health Dept. Aware to provide a copy of the vaccination record if obtained from local pharmacy or Health Dept. Verbalized acceptance and understanding.  Covid-19 vaccine status: Completed vaccines  Qualifies for Shingles Vaccine? Yes   Zostavax completed No   Shingrix Completed?: No.    Education has been provided regarding the importance of this vaccine. Patient has been advised to call insurance company to determine out of pocket expense if they have not yet received this vaccine. Advised may also receive vaccine at local pharmacy or Health Dept. Verbalized acceptance and understanding.  Screening Tests Health Maintenance  Topic Date Due   FOOT EXAM  Never done   OPHTHALMOLOGY EXAM  Never done   Hepatitis C Screening  Never done    TETANUS/TDAP  Never done   Zoster Vaccines- Shingrix (1 of 2) Never done   COLONOSCOPY (Pts 45-53yrs Insurance coverage will need to be confirmed)  Never done   MAMMOGRAM  03/31/2014   Pneumonia Vaccine 59+ Years old (2 - PCV) 07/22/2014   DEXA SCAN  Never done   COVID-19 Vaccine (3 - Moderna risk series) 05/01/2020   INFLUENZA VACCINE  09/11/2021   HEMOGLOBIN A1C  11/01/2021   HPV VACCINES  Aged Out    Health Maintenance  Health Maintenance Due  Topic Date Due   FOOT EXAM  Never done   OPHTHALMOLOGY EXAM  Never done   Hepatitis C Screening  Never done   TETANUS/TDAP  Never done   Zoster Vaccines- Shingrix (1 of 2) Never  done   COLONOSCOPY (Pts 45-71yrs Insurance coverage will need to be confirmed)  Never done   MAMMOGRAM  03/31/2014   Pneumonia Vaccine 59+ Years old (2 - PCV) 07/22/2014   DEXA SCAN  Never done   COVID-19 Vaccine (3 - Moderna risk series) 05/01/2020    Colorectal cancer screening: Referral to GI placed 08/10/21. Pt aware the office will call re: appt.  Mammogram status: Ordered 08/10/21. Pt provided with contact info and advised to call to schedule appt.   Bone Density status: Ordered 08/10/21. Pt provided with contact info and advised to call to schedule appt.   Additional Screening:  Hepatitis C Screening: does qualify;  Vision Screening: Recommended annual ophthalmology exams for early detection of glaucoma and other disorders of the eye. Is the patient up to date with their annual eye exam?  Yes  Who is the provider or what is the name of the office in which the patient attends annual eye exams? [facility]  If pt is not established with a provider, would they like to be referred to a provider to establish care? No .   Dental Screening: Recommended annual dental exams for proper oral hygiene  Community Resource Referral / Chronic Care Management: CRR required this visit?  No   CCM required this visit?  No      Plan:     I have personally  reviewed and noted the following in the patient's chart:   Medical and social history Use of alcohol, tobacco or illicit drugs  Current medications and supplements including opioid prescriptions. Patient is currently taking opioid prescriptions. Information provided to patient regarding non-opioid alternatives. Patient advised to discuss non-opioid treatment plan with their provider. Functional ability and status Nutritional status Physical activity Advanced directives List of other physicians Hospitalizations, surgeries, and ER visits in previous 12 months Vitals Screenings to include cognitive, depression, and falls Referrals and appointments  In addition, I have reviewed and discussed with patient certain preventive protocols, quality metrics, and best practice recommendations. A written personalized care plan for preventive services as well as general preventive health recommendations were provided to patient.     Willette Brace, LPN   [id number]   Nurse Notes: pt stated the address to remember totally slipped her mind. Scoring 12 on 6CIT.

## 2021-08-10 NOTE — Patient Instructions (Signed)
Joanne Morris , Thank you for taking time to come for your Medicare Wellness Visit. I appreciate your ongoing commitment to your health goals. Please review the following plan we discussed and let me know if I can assist you in the future.   Screening recommendations/referrals: Colonoscopy: order placed 08/10/21 Mammogram: order placed 08/10/21 Bone Density: Order placed 08/10/21 Recommended yearly ophthalmology/optometry visit for glaucoma screening and checkup Recommended yearly dental visit for hygiene and checkup  Vaccinations: Influenza vaccine: Done 12/26/20 repeat every year  Pneumococcal vaccine due and discussed  Tdap vaccine: due  Shingles vaccine: Shingrix discussed. Please contact your pharmacy for coverage information.    Covid-19:Completed 1/24, 04/04/19  Advanced directives: Advance directive discussed with you today. Even though you declined this today please call our office should you change your mind and we can give you the proper paperwork for you to fill out.  Conditions/risks identified: none at this time   Next appointment: Follow up in one year for your annual wellness visit    Preventive Care 65 Years and Older, Female Preventive care refers to lifestyle choices and visits with your health care provider that can promote health and wellness. What does preventive care include? A yearly physical exam. This is also called an annual well check. Dental exams once or twice a year. Routine eye exams. Ask your health care provider how often you should have your eyes checked. Personal lifestyle choices, including: Daily care of your teeth and gums. Regular physical activity. Eating a healthy diet. Avoiding tobacco and drug use. Limiting alcohol use. Practicing safe sex. Taking low-dose aspirin every day. Taking vitamin and mineral supplements as recommended by your health care provider. What happens during an annual well check? The services and screenings done by your  health care provider during your annual well check will depend on your age, overall health, lifestyle risk factors, and family history of disease. Counseling  Your health care provider may ask you questions about your: Alcohol use. Tobacco use. Drug use. Emotional well-being. Home and relationship well-being. Sexual activity. Eating habits. History of falls. Memory and ability to understand (cognition). Work and work Statistician. Reproductive health. Screening  You may have the following tests or measurements: Height, weight, and BMI. Blood pressure. Lipid and cholesterol levels. These may be checked every 5 years, or more frequently if you are over 53 years old. Skin check. Lung cancer screening. You may have this screening every year starting at age 66 if you have a 30-pack-year history of smoking and currently smoke or have quit within the past 15 years. Fecal occult blood test (FOBT) of the stool. You may have this test every year starting at age 59. Flexible sigmoidoscopy or colonoscopy. You may have a sigmoidoscopy every 5 years or a colonoscopy every 10 years starting at age 28. Hepatitis C blood test. Hepatitis B blood test. Sexually transmitted disease (STD) testing. Diabetes screening. This is done by checking your blood sugar (glucose) after you have not eaten for a while (fasting). You may have this done every 1-3 years. Bone density scan. This is done to screen for osteoporosis. You may have this done starting at age 56. Mammogram. This may be done every 1-2 years. Talk to your health care provider about how often you should have regular mammograms. Talk with your health care provider about your test results, treatment options, and if necessary, the need for more tests. Vaccines  Your health care provider may recommend certain vaccines, such as: Influenza vaccine. This is recommended  every year. Tetanus, diphtheria, and acellular pertussis (Tdap, Td) vaccine. You may  need a Td booster every 10 years. Zoster vaccine. You may need this after age 12. Pneumococcal 13-valent conjugate (PCV13) vaccine. One dose is recommended after age 49. Pneumococcal polysaccharide (PPSV23) vaccine. One dose is recommended after age 61. Talk to your health care provider about which screenings and vaccines you need and how often you need them. This information is not intended to replace advice given to you by your health care provider. Make sure you discuss any questions you have with your health care provider. Document Released: 02/24/2015 Document Revised: 10/18/2015 Document Reviewed: 11/29/2014 Elsevier Interactive Patient Education  2017 [employer] Prevention in the Home Falls can cause injuries. They can happen to people of all ages. There are many things you can do to make your home safe and to help prevent falls. What can I do on the outside of my home? Regularly fix the edges of walkways and driveways and fix any cracks. Remove anything that might make you trip as you walk through a door, such as a raised step or threshold. Trim any bushes or trees on the path to your home. Use bright outdoor lighting. Clear any walking paths of anything that might make someone trip, such as rocks or tools. Regularly check to see if handrails are loose or broken. Make sure that both sides of any steps have handrails. Any raised decks and porches should have guardrails on the edges. Have any leaves, snow, or ice cleared regularly. Use sand or salt on walking paths during winter. Clean up any spills in your garage right away. This includes oil or grease spills. What can I do in the bathroom? Use night lights. Install grab bars by the toilet and in the tub and shower. Do not use towel bars as grab bars. Use non-skid mats or decals in the tub or shower. If you need to sit down in the shower, use a plastic, non-slip stool. Keep the floor dry. Clean up any water that spills on  the floor as soon as it happens. Remove soap buildup in the tub or shower regularly. Attach bath mats securely with double-sided non-slip rug tape. Do not have throw rugs and other things on the floor that can make you trip. What can I do in the bedroom? Use night lights. Make sure that you have a light by your bed that is easy to reach. Do not use any sheets or blankets that are too big for your bed. They should not hang down onto the floor. Have a firm chair that has side arms. You can use this for support while you get dressed. Do not have throw rugs and other things on the floor that can make you trip. What can I do in the kitchen? Clean up any spills right away. Avoid walking on wet floors. Keep items that you use a lot in easy-to-reach places. If you need to reach something above you, use a strong step stool that has a grab bar. Keep electrical cords out of the way. Do not use floor polish or wax that makes floors slippery. If you must use wax, use non-skid floor wax. Do not have throw rugs and other things on the floor that can make you trip. What can I do with my stairs? Do not leave any items on the stairs. Make sure that there are handrails on both sides of the stairs and use them. Fix handrails that are broken  or loose. Make sure that handrails are as long as the stairways. Check any carpeting to make sure that it is firmly attached to the stairs. Fix any carpet that is loose or worn. Avoid having throw rugs at the top or bottom of the stairs. If you do have throw rugs, attach them to the floor with carpet tape. Make sure that you have a light switch at the top of the stairs and the bottom of the stairs. If you do not have them, ask someone to add them for you. What else can I do to help prevent falls? Wear shoes that: Do not have high heels. Have rubber bottoms. Are comfortable and fit you well. Are closed at the toe. Do not wear sandals. If you use a stepladder: Make sure  that it is fully opened. Do not climb a closed stepladder. Make sure that both sides of the stepladder are locked into place. Ask someone to hold it for you, if possible. Clearly mark and make sure that you can see: Any grab bars or handrails. First and last steps. Where the edge of each step is. Use tools that help you move around (mobility aids) if they are needed. These include: Canes. Walkers. Scooters. Crutches. Turn on the lights when you go into a dark area. Replace any light bulbs as soon as they burn out. Set up your furniture so you have a clear path. Avoid moving your furniture around. If any of your floors are uneven, fix them. If there are any pets around you, be aware of where they are. Review your medicines with your doctor. Some medicines can make you feel dizzy. This can increase your chance of falling. Ask your doctor what other things that you can do to help prevent falls. This information is not intended to replace advice given to you by your health care provider. Make sure you discuss any questions you have with your health care provider. Document Released: 11/24/2008 Document Revised: 07/06/2015 Document Reviewed: 03/04/2014 Elsevier Interactive Patient Education  2017 [employer].

## 2021-08-16 ENCOUNTER — Telehealth: Payer: Self-pay | Admitting: Family

## 2021-08-16 DIAGNOSIS — G894 Chronic pain syndrome: Secondary | ICD-10-CM

## 2021-08-16 MED ORDER — HYDROCODONE-ACETAMINOPHEN 5-325 MG PO TABS: 1.0000 | ORAL_TABLET | Freq: Every day | ORAL | 0 refills | Status: DC | PRN

## 2021-08-16 NOTE — Telephone Encounter (Signed)
Confirmed with pt, [facility] pharmacy and not a Biomedical scientist.   LAST APPOINTMENT DATE:  05/01/21 OV with PCP   NEXT APPOINTMENT DATE: 08/21/21 OV with PCP   MEDICATION: HYDROcodone-acetaminophen (NORCO/VICODIN) 5-325 MG tablet [[id number]]   Is the patient out of medication?  Has 2 left   PHARMACY: [facility] [place], [address]  [place], [place] [address]  Phone:  [phone]  Fax:  [phone]

## 2021-08-16 NOTE — Telephone Encounter (Signed)
RX sent, has to keep her appt 7/11

## 2021-08-17 NOTE — Telephone Encounter (Signed)
I called pt and reminded her of her appointment on 08/21/2021 and let her know we have filled her RX.  Pt gave a verbalized understanding.

## 2021-08-21 ENCOUNTER — Other Ambulatory Visit: Payer: Self-pay | Admitting: Family

## 2021-08-21 ENCOUNTER — Ambulatory Visit: Payer: Medicare Other | Admitting: Family

## 2021-08-24 ENCOUNTER — Other Ambulatory Visit: Payer: Self-pay

## 2021-08-27 ENCOUNTER — Other Ambulatory Visit: Payer: Self-pay | Admitting: Family

## 2021-08-28 ENCOUNTER — Ambulatory Visit: Payer: Medicare Other | Admitting: Family

## 2021-08-29 ENCOUNTER — Other Ambulatory Visit: Payer: Self-pay | Admitting: Family

## 2021-08-29 DIAGNOSIS — G894 Chronic pain syndrome: Secondary | ICD-10-CM

## 2021-08-29 NOTE — Telephone Encounter (Signed)
this request is coming from her mail order but I sent it to the local pharmacy - do not know why the mail order is requesting.

## 2021-08-30 ENCOUNTER — Telehealth: Payer: Self-pay | Admitting: Family

## 2021-08-30 NOTE — Telephone Encounter (Signed)
Joanne Morris with [facility] called to confirm if office received request for 9-10 RX refills. Per telephone note 08/16/21 [name] was advised that we are only taking refill requests from Patient. [name] requested to leave the following information for [employer].  Fax# [phone] Attn: Mr. Leontine Locket

## 2021-09-06 ENCOUNTER — Telehealth: Payer: Self-pay | Admitting: Family

## 2021-09-06 NOTE — Telephone Encounter (Signed)
Pt requests: -[facility]  LAST APPOINTMENT DATE:   05/01/21 OV with PCP  NEXT APPOINTMENT DATE: 09/11/21 OV with PCP  MEDICATION: metoprolol tartrate (LOPRESSOR) 100 MG tablet [[id number]]    Is the patient out of medication?  Yes, ran out today (09/06/21)  [id number] [place] [address]  [place], [place] [address]  Phone:  [phone]  Fax:  [phone]  DEA #:  [id number]

## 2021-09-06 NOTE — Telephone Encounter (Signed)
I thought all meds were going to new mail order pharmacy? ok to send a [facility] day [facility], but may they may not fill if already coming in mail - put "emergency fill" in pharmacy comments

## 2021-09-07 ENCOUNTER — Other Ambulatory Visit: Payer: Self-pay

## 2021-09-07 MED ORDER — METOPROLOL TARTRATE 100 MG PO TABS: 100.0000 mg | ORAL_TABLET | Freq: Two times a day (BID) | ORAL | 0 refills | Status: DC

## 2021-09-07 NOTE — Telephone Encounter (Signed)
RX sent

## 2021-09-11 ENCOUNTER — Encounter: Payer: Self-pay | Admitting: Family

## 2021-09-11 ENCOUNTER — Ambulatory Visit (INDEPENDENT_AMBULATORY_CARE_PROVIDER_SITE_OTHER): Payer: Medicare Other | Admitting: Family

## 2021-09-11 VITALS — BP 125/62 | HR 67 | Temp 97.1°F | Ht 61.0 in | Wt 144.0 lb

## 2021-09-11 DIAGNOSIS — G894 Chronic pain syndrome: Secondary | ICD-10-CM

## 2021-09-11 DIAGNOSIS — G4700 Insomnia, unspecified: Secondary | ICD-10-CM | POA: Diagnosis not present

## 2021-09-11 DIAGNOSIS — F3341 Major depressive disorder, recurrent, in partial remission: Secondary | ICD-10-CM

## 2021-09-11 MED ORDER — HYDROCODONE-ACETAMINOPHEN 5-325 MG PO TABS: 1.0000 | ORAL_TABLET | Freq: Every day | ORAL | 0 refills | Status: DC | PRN

## 2021-09-11 MED ORDER — SERTRALINE HCL 50 MG PO TABS: 50.0000 mg | ORAL_TABLET | Freq: Every day | ORAL | 1 refills | Status: DC

## 2021-09-11 NOTE — Assessment & Plan Note (Addendum)
   Chronic  Taking Zoloft 150 mg daily  Reports that she feels cold in her home and concerned that this is an anxiety symptom  Advised that she is on Pletal and a baby aspirin every day and this is more likely the cause  Continue Zoloft daily, sending refill today  Follow-up in 6 months

## 2021-09-11 NOTE — Patient Instructions (Addendum)
It was very nice to see you today!    Schedule a virtual visit for 3 months.  We will walk you through how to use. We will send you a text and you click on the link to get into the visit. Ask your daughter to help you download the MyChart app so you can see all of your medical information.  I have sent your Hydrocodone to your pharmacy.    PLEASE NOTE:  If you had any lab tests please let us know if you have not heard back within a few days. You may see your results on MyChart before we have a chance to review them but we will give you a call once they are reviewed by [place]. If we ordered any referrals today, please let us know if you have not heard from their office within the next week.

## 2021-09-11 NOTE — Assessment & Plan Note (Signed)
   Chronic  Takes 1 hydrocodone pill daily, denies any side effects  Sending refill today  Follow-up in 3 months, either virtual or in office

## 2021-09-11 NOTE — Progress Notes (Signed)
Patient ID: Joanne Morris, female    DOB: Jun 30, 1950, 71 y.o.   MRN: [id number]  Chief Complaint  Patient presents with   Anxiety    Pt states it comes and goes.    Osteoarthritis   Pain Management   Depression   Insomnia    HPI: Anxiety/Depression: Patient complains of anxiety disorder and depression .   She has the following symptoms: none.  Onset of symptoms was approximately  years ago, She denies current suicidal and homicidal ideation.  Risk factors: previous episode of depression  Previous treatment includes Zoloft.  She complains of the following side effects from the treatment: none.    08/10/2021    8:58 AM  Depression screen PHQ 2/9  Decreased Interest 0  Down, Depressed, Hopeless 0  PHQ - 2 Score 0   Pain She reports chronic right side pain. was not an injury that may have caused the pain. The pain started about a year ago and is staying constant. The pain does not radiate. The pain is described as aching and soreness, is moderate in intensity, occurring intermittently. Symptoms are worse in the: evening  Aggravating factors: standing and walking She has tried application of heat, application of ice, NSAIDs, and prescription pain relievers with moderate relief.  INSOMNIA:  How long: years. Difficulty initiating sleep: yes.  Difficulty maintaining sleep: yes.  OTC meds tried: Benadryl, Melatonin. RX meds in past: Trazodone.  Sleep hygiene measures: yes. Started any new meds recently: no. Shift worker: no. New stressors: no. Currently taking Zolpidem qhs, working well, denies any SE.   Assessment & Plan:   Problem List Items Addressed This Visit       Other   Depression    Chronic Taking Zoloft 150 mg daily Reports that she feels cold in her home and concerned that this is an anxiety symptom Advised that she is on Pletal and a baby aspirin every day and this is more likely the cause Continue Zoloft daily, sending refill today Follow-up in 6 months      Relevant  Medications   sertraline (ZOLOFT) 50 MG tablet   Chronic pain syndrome    Chronic Takes 1 hydrocodone pill daily, denies any side effects Sending refill today Follow-up in 3 months, either virtual or in office      Relevant Medications   HYDROcodone-acetaminophen (NORCO/VICODIN) 5-325 MG tablet   sertraline (ZOLOFT) 50 MG tablet   Insomnia - Primary    Chronic Stable on Ambien She states [facility] charges her over $[facility], but [facility] Follow-up in 3 months, in office or virtual       Subjective:    Outpatient Medications Prior to Visit  Medication Sig Dispense Refill   amLODipine (NORVASC) 10 MG tablet TAKE 1 TABLET BY MOUTH EVERY DAY 90 tablet 1   aspirin EC 81 MG tablet Take 81 mg by mouth daily.     CALCIUM-VITAMIN D PO Take 1 tablet by mouth daily.     Cholecalciferol (VITAMIN D3) 50 MCG (2000 UT) capsule Take 1 capsule (2,000 Units total) by mouth daily. 90 capsule 1   cilostazol (PLETAL) 100 MG tablet TAKE 1 TABLET (100 MG TOTAL) BY MOUTH 2 (TWO) TIMES DAILY BEFORE A MEAL. 180 tablet 3   cyclobenzaprine (FLEXERIL) 10 MG tablet Take 10 mg by mouth 2 (two) times daily.     FARXIGA 5 MG TABS tablet TAKE 1 TABLET (5 MG TOTAL) BY MOUTH IN THE MORNING 30 tablet 2  ferrous sulfate 325 (65 FE) MG EC tablet Take 325 mg by mouth daily with breakfast.     glipiZIDE (GLUCOTROL XL) 5 MG 24 hr tablet Take 5 mg by mouth in the morning.     hydrALAZINE (APRESOLINE) 50 MG tablet Take 50 mg by mouth 3 (three) times daily.     levocetirizine (XYZAL) 5 MG tablet Take 1 tablet (5 mg total) by mouth daily as needed for allergies. 90 tablet 1   LINZESS 145 MCG CAPS capsule Take 145 mcg by mouth daily.     metoprolol tartrate (LOPRESSOR) 100 MG tablet Take 1 tablet (100 mg total) by mouth 2 (two) times daily. 30 tablet 0   niacin (NIASPAN) 500 MG CR tablet TAKE 1 TABLET BY MOUTH DAILY 100 tablet 2   omeprazole (PRILOSEC) 20 MG capsule Take 20 mg by mouth daily as needed  (acid reflux).     polyethylene glycol powder (GLYCOLAX/MIRALAX) 17 GM/SCOOP powder Take 17 g by mouth daily as needed for constipation.     pravastatin (PRAVACHOL) 40 MG tablet Take 1 tablet (40 mg total) by mouth daily. 90 tablet 1   sertraline (ZOLOFT) 100 MG tablet Take 100 mg by mouth daily.     valsartan (DIOVAN) 320 MG tablet TAKE 1 TABLET BY MOUTH DAILY 100 tablet 2   Vitamin D, Ergocalciferol, (DRISDOL) 1.25 MG (50000 UNIT) CAPS capsule Take 50,000 Units by mouth every 7 (seven) days.     zolpidem (AMBIEN CR) 12.5 MG CR tablet Take 1 tablet (12.5 mg total) by mouth at bedtime. 90 tablet 1   HYDROcodone-acetaminophen (NORCO/VICODIN) 5-325 MG tablet Take 1 tablet by mouth daily as needed for moderate pain. 30 tablet 0   sertraline (ZOLOFT) 50 MG tablet TAKE 1 TABLET BY MOUTH EVERY EVENING. 90 tablet 1   No facility-administered medications prior to visit.   Past Medical History:  Diagnosis Date   Abdominal pain 10/11/2012   Anemia 05/31/2011   Angina    Anxiety    Arthritis    "knees" (07/20/2013)   Atypical chest pain 05/31/2011   Carotid stenosis, asymptomatic 04/18/2014   Claudication ([place])    COVID-19 virus infection 08/01/2018   Depression    GERD (gastroesophageal reflux disease)    Hematoma of neck 04/23/2014   High cholesterol    Hyperlipemia 05/31/2011   Hypokalemia 05/31/2011   Leucocytosis 05/31/2011   Myocardial infarction [facility]) 1990's   "1"   Neck pain on right side 10/04/2014   PAD (peripheral artery disease) ([place])    Peripheral arterial disease ([place]) 06/08/2013   Peripheral arterial disease   Radiculopathy of leg 10/11/2012   Stroke [facility])    "mini stroke 1st then regular stroke", denies residual on 07/20/2013   Wears glasses    Past Surgical History:  Procedure Laterality Date   ABDOMINAL HYSTERECTOMY     ATHERECTOMY  10/05/2013   BALLOON ANGIOPLASTY, ARTERY  10/05/2013   DR [name]   CAROTID ENDARTERECTOMY     CORONARY ANGIOPLASTY WITH STENT PLACEMENT     "1"    ENDARTERECTOMY Left 02/07/2014   Procedure: ENDARTERECTOMY CAROTID;  Surgeon: Elam Dutch, MD;  Location: [place];  Service: Vascular;  Laterality: Left;   ENDARTERECTOMY Right 04/18/2014   Procedure: ENDARTERECTOMY RIGHT CAROTID;  Surgeon: Elam Dutch, MD;  Location: [place];  Service: Vascular;  Laterality: Right;   ENDARTERECTOMY N/A 04/24/2014   Procedure: IRRIGATION AND DEBRIDEMENT OF RIGHT NECK ;  Surgeon: Elam Dutch, MD;  Location: [place];  Service: Vascular;  Laterality: N/A;   ESOPHAGOGASTRODUODENOSCOPY N/A 10/13/2012   Procedure: ESOPHAGOGASTRODUODENOSCOPY (EGD);  Surgeon: Gatha Mayer, MD;  Location: [facility] ENDOSCOPY;  Service: Endoscopy;  Laterality: N/A;   KENALOG INJECTION Bilateral 08/22/2016   Procedure: KENALOG INJECTION BILATERAL NECK;  Surgeon: Wallace Going, DO;  Location: [place];  Service: Plastics;  Laterality: Bilateral;   LOWER EXTREMITY ANGIOGRAM  07/20/2013   Unsuccessful attempt at crossing the CTO/notes 07/20/2013   LOWER EXTREMITY ANGIOGRAM N/A 07/06/2013   Procedure: LOWER EXTREMITY ANGIOGRAM;  Surgeon: Laverda Page, MD;  Location: [facility] CATH LAB;  Service: Cardiovascular;  Laterality: N/A;   LOWER EXTREMITY ANGIOGRAM N/A 07/20/2013   Procedure: LOWER EXTREMITY ANGIOGRAM;  Surgeon: Laverda Page, MD;  Location: [facility] CATH LAB;  Service: Cardiovascular;  Laterality: N/A;   LOWER EXTREMITY ANGIOGRAM N/A 10/05/2013   Procedure: LOWER EXTREMITY ANGIOGRAM;  Surgeon: Laverda Page, MD;  Location: [facility] CATH LAB;  Service: Cardiovascular;  Laterality: N/A;   MASS EXCISION Right 08/22/2016   Procedure: EXCISION RIGHT NECK KELOID;  Surgeon: Wallace Going, DO;  Location: [place];  Service: Plastics;  Laterality: Right;   MASS EXCISION Right 09/18/2020   Procedure: Excision of right neck keloid;  Surgeon: Wallace Going, DO;  Location: [place];  Service: Plastics;  Laterality: Right;   PATCH ANGIOPLASTY Left 02/07/2014    Procedure: PATCH ANGIOPLASTY Carotid;  Surgeon: Elam Dutch, MD;  Location: [place];  Service: Vascular;  Laterality: Left;   PATCH ANGIOPLASTY Right 04/18/2014   Procedure: PATCH ANGIOPLASTY USING HEMASHIELD 0.8cmx 7.6cm PATCH;  Surgeon: Elam Dutch, MD;  Location: [place];  Service: Vascular;  Laterality: Right;   PERIPHERAL VASCULAR CATHETERIZATION N/A 07/05/2014   Procedure: Lower Extremity Angiography;  Surgeon: [name], MD;  Location: [place] CV LAB;  Service: Cardiovascular;  Laterality: N/A;   TUBAL LIGATION     No Known Allergies    Objective:    Physical Exam Vitals and nursing note reviewed.  Constitutional:      Appearance: Normal appearance.  Cardiovascular:     Rate and Rhythm: Normal rate and regular rhythm.  Pulmonary:     Effort: Pulmonary effort is normal.     Breath sounds: Normal breath sounds.  Musculoskeletal:        General: Normal range of motion.  Skin:    General: Skin is warm and dry.  Neurological:     Mental Status: She is alert.  Psychiatric:        Mood and Affect: Mood normal.        Behavior: Behavior normal.    BP 125/62 (BP Location: Left Arm, Patient Position: Sitting, Cuff Size: Large)   Pulse 67   Temp (!) 97.1 F (36.2 C) (Temporal)   Ht 5\' 1"  (1.549 m)   Wt 144 lb (65.3 kg)   SpO2 98%   BMI 27.21 kg/m  Wt Readings from Last 3 Encounters:  09/11/21 144 lb (65.3 kg)  05/22/21 142 lb 14.4 oz (64.8 kg)  05/01/21 143 lb 3.2 oz (65 kg)       [name], NP

## 2021-09-11 NOTE — Assessment & Plan Note (Addendum)
   Chronic  Stable on Ambien  She states [facility] charges her over $100, but [facility]  Follow-up in 3 months, in office or virtual

## 2021-09-14 ENCOUNTER — Other Ambulatory Visit: Payer: Self-pay | Admitting: Family

## 2021-09-17 ENCOUNTER — Other Ambulatory Visit: Payer: Self-pay | Admitting: Family

## 2021-10-01 ENCOUNTER — Telehealth: Payer: Self-pay | Admitting: Family

## 2021-10-01 DIAGNOSIS — G894 Chronic pain syndrome: Secondary | ICD-10-CM

## 2021-10-01 MED ORDER — HYDROCODONE-ACETAMINOPHEN 5-325 MG PO TABS: 1.0000 | ORAL_TABLET | Freq: Every day | ORAL | 0 refills | Status: DC | PRN

## 2021-10-01 NOTE — Telephone Encounter (Signed)
.   Encourage patient to contact the pharmacy for refills or they can request refills through [facility]: 09/11/21  NEXT APPOINTMENT DATE: 12/18/21  MEDICATION: hydrocodone  Is the patient out of medication?   PHARMACY: [facility] on [address]   Let patient know to contact pharmacy at the end of the day to make sure medication is ready.  Please notify patient to allow 48-72 hours to process  CLINICAL FILLS OUT ALL BELOW:   LAST REFILL:  QTY:  REFILL DATE:    OTHER COMMENTS:    Okay for refill?  Please advise

## 2021-10-01 NOTE — Telephone Encounter (Signed)
RX sent to [facility] in [place]

## 2021-10-02 ENCOUNTER — Encounter: Payer: Self-pay | Admitting: Vascular Surgery

## 2021-10-02 ENCOUNTER — Other Ambulatory Visit: Payer: Self-pay | Admitting: Family

## 2021-10-02 ENCOUNTER — Ambulatory Visit (HOSPITAL_COMMUNITY)
Admission: RE | Admit: 2021-10-02 | Discharge: 2021-10-02 | Disposition: A | Discharge status: Home / Self Care | Payer: Medicare Other | Source: Ambulatory Visit | Attending: Vascular Surgery | Admitting: Vascular Surgery

## 2021-10-02 ENCOUNTER — Ambulatory Visit (INDEPENDENT_AMBULATORY_CARE_PROVIDER_SITE_OTHER)
Admission: RE | Admit: 2021-10-02 | Discharge: 2021-10-02 | Disposition: A | Discharge status: Home / Self Care | Payer: Medicare Other | Source: Ambulatory Visit | Attending: Physician Assistant | Admitting: Physician Assistant

## 2021-10-02 ENCOUNTER — Telehealth: Payer: Self-pay | Admitting: Family

## 2021-10-02 ENCOUNTER — Ambulatory Visit (INDEPENDENT_AMBULATORY_CARE_PROVIDER_SITE_OTHER): Payer: Medicare Other | Admitting: Vascular Surgery

## 2021-10-02 VITALS — BP 147/71 | HR 67 | Temp 97.0°F | Resp 14 | Ht 59.0 in | Wt 141.0 lb

## 2021-10-02 DIAGNOSIS — Z9889 Other specified postprocedural states: Secondary | ICD-10-CM | POA: Diagnosis not present

## 2021-10-02 DIAGNOSIS — I739 Peripheral vascular disease, unspecified: Secondary | ICD-10-CM | POA: Insufficient documentation

## 2021-10-02 NOTE — Progress Notes (Signed)
Patient name: Joanne Morris MRN: [id number] DOB: 07/29/1950 Sex: female  REASON FOR VISIT: Follow-up for surveillance carotid artery disease and PAD  HPI: Joanne Morris is a 71 y.o. female with history of carotid stenosis status post bilateral carotid endarterectomies by Dr. [name], hypertension, hyperlipidemia, coronary artery disease status post remote MI, diabetes, PAD that presents for surveillance of PAD and carotid artery disease.  Reports no strokes or TIAs since last follow-up.  She is most bothered by burning in the right calf after walking 15 to 20 minutes.  No rest pain or wounds.   She has previously been under care of Dr. [name].  She has previously undergone a right peroneal atherectomy 2015.  She has single-vessel runoff in the peroneal artery on the right per his note as well as single vessel peroneal runoff on the left.    Past Medical History:  Diagnosis Date   Abdominal pain 10/11/2012   Anemia 05/31/2011   Angina    Anxiety    Arthritis    "knees" (07/20/2013)   Atypical chest pain 05/31/2011   Carotid stenosis, asymptomatic 04/18/2014   Claudication ([place])    COVID-19 virus infection 08/01/2018   Depression    GERD (gastroesophageal reflux disease)    Hematoma of neck 04/23/2014   High cholesterol    Hyperlipemia 05/31/2011   Hypokalemia 05/31/2011   Leucocytosis 05/31/2011   Myocardial infarction [facility]) 1990's   "1"   Neck pain on right side 10/04/2014   PAD (peripheral artery disease) ([place])    Peripheral arterial disease ([place]) 06/08/2013   Peripheral arterial disease   Radiculopathy of leg 10/11/2012   Stroke [facility])    "mini stroke 1st then regular stroke", denies residual on 07/20/2013   Wears glasses     Past Surgical History:  Procedure Laterality Date   ABDOMINAL HYSTERECTOMY     ATHERECTOMY  10/05/2013   BALLOON ANGIOPLASTY, ARTERY  10/05/2013   DR [name]   CAROTID ENDARTERECTOMY     CORONARY ANGIOPLASTY WITH STENT PLACEMENT     "1"   ENDARTERECTOMY Left 02/07/2014    Procedure: ENDARTERECTOMY CAROTID;  Surgeon: Elam Dutch, MD;  Location: [facility] OR;  Service: Vascular;  Laterality: Left;   ENDARTERECTOMY Right 04/18/2014   Procedure: ENDARTERECTOMY RIGHT CAROTID;  Surgeon: Elam Dutch, MD;  Location: [facility] OR;  Service: Vascular;  Laterality: Right;   ENDARTERECTOMY N/A 04/24/2014   Procedure: IRRIGATION AND DEBRIDEMENT OF RIGHT NECK ;  Surgeon: Elam Dutch, MD;  Location: [place];  Service: Vascular;  Laterality: N/A;   ESOPHAGOGASTRODUODENOSCOPY N/A 10/13/2012   Procedure: ESOPHAGOGASTRODUODENOSCOPY (EGD);  Surgeon: Gatha Mayer, MD;  Location: [facility] ENDOSCOPY;  Service: Endoscopy;  Laterality: N/A;   KENALOG INJECTION Bilateral 08/22/2016   Procedure: KENALOG INJECTION BILATERAL NECK;  Surgeon: [place] Going, DO;  Location: [place];  Service: Plastics;  Laterality: Bilateral;   LOWER EXTREMITY ANGIOGRAM  07/20/2013   Unsuccessful attempt at crossing the CTO/notes 07/20/2013   LOWER EXTREMITY ANGIOGRAM N/A 07/06/2013   Procedure: LOWER EXTREMITY ANGIOGRAM;  Surgeon: Laverda Page, MD;  Location: [facility] C[facility] LAB;  Service: Cardiovascular;  Laterality: N/A;   LOWER EXTREMITY ANGIOGRAM N/A 07/20/2013   Procedure: LOWER EXTREMITY ANGIOGRAM;  Surgeon: Laverda Page, MD;  Location: [facility] CATH LAB;  Service: Cardiovascular;  Laterality: N/A;   LOWER EXTREMITY ANGIOGRAM N/A 10/05/2013   Procedure: LOWER EXTREMITY ANGIOGRAM;  Surgeon: Laverda Page, MD;  Location: [facility] CATH LAB;  Service: Cardiovascular;  Laterality: N/A;  MASS EXCISION Right 08/22/2016   Procedure: EXCISION RIGHT NECK KELOID;  Surgeon: [place] Going, DO;  Location: [place];  Service: Plastics;  Laterality: Right;   MASS EXCISION Right 09/18/2020   Procedure: Excision of right neck keloid;  Surgeon: [place] Going, DO;  Location: [place];  Service: Plastics;  Laterality: Right;   PATCH ANGIOPLASTY Left 02/07/2014   Procedure: PATCH ANGIOPLASTY  Carotid;  Surgeon: Elam Dutch, MD;  Location: [place];  Service: Vascular;  Laterality: Left;   PATCH ANGIOPLASTY Right 04/18/2014   Procedure: PATCH ANGIOPLASTY USING HEMASHIELD 0.8cmx 7.6cm PATCH;  Surgeon: Elam Dutch, MD;  Location: [place];  Service: Vascular;  Laterality: Right;   PERIPHERAL VASCULAR CATHETERIZATION N/A 07/05/2014   Procedure: Lower Extremity Angiography;  Surgeon: [name], MD;  Location: [place] CV LAB;  Service: Cardiovascular;  Laterality: N/A;   TUBAL LIGATION      History reviewed. No pertinent family history.  SOCIAL HISTORY: Social History   Tobacco Use   Smoking status: Former    Packs/day: 0.50    Years: 4.00    Total pack years: 2.00    Types: Cigarettes    Quit date: 05/30/2009    Years since quitting: 12.3   Smokeless tobacco: Never  Substance Use Topics   Alcohol use: No    Alcohol/week: 0.0 standard drinks of alcohol    No Known Allergies   Current Outpatient Medications  Medication Sig Dispense Refill   amLODipine (NORVASC) 10 MG tablet TAKE 1 TABLET BY MOUTH EVERY DAY 90 tablet 1   aspirin EC 81 MG tablet Take 81 mg by mouth daily.     CALCIUM-VITAMIN D PO Take 1 tablet by mouth daily.     Cholecalciferol (VITAMIN D3) 50 MCG (2000 UT) capsule Take 1 capsule (2,000 Units total) by mouth daily. 90 capsule 1   cilostazol (PLETAL) 100 MG tablet TAKE 1 TABLET (100 MG TOTAL) BY MOUTH 2 (TWO) TIMES DAILY BEFORE A MEAL. 180 tablet 3   cyclobenzaprine (FLEXERIL) 10 MG tablet Take 10 mg by mouth 2 (two) times daily.     FARXIGA 5 MG TABS tablet TAKE 1 TABLET (5 MG TOTAL) BY MOUTH IN THE MORNING 30 tablet 2   ferrous sulfate 325 (65 FE) MG EC tablet Take 325 mg by mouth daily with breakfast.     glipiZIDE (GLUCOTROL XL) 5 MG 24 hr tablet Take 5 mg by mouth in the morning.     hydrALAZINE (APRESOLINE) 50 MG tablet Take 50 mg by mouth 3 (three) times daily.     HYDROcodone-acetaminophen (NORCO/VICODIN) 5-325 MG tablet Take 1 tablet by mouth  daily as needed for moderate pain. 30 tablet 0   levocetirizine (XYZAL) 5 MG tablet Take 1 tablet (5 mg total) by mouth daily as needed for allergies. 90 tablet 1   LINZESS 145 MCG CAPS capsule Take 145 mcg by mouth daily.     metoprolol tartrate (LOPRESSOR) 100 MG tablet Take 1 tablet (100 mg total) by mouth 2 (two) times daily. 30 tablet 0   niacin (NIASPAN) 500 MG CR tablet TAKE 1 TABLET BY MOUTH DAILY 100 tablet 2   omeprazole (PRILOSEC) 20 MG capsule Take 20 mg by mouth daily as needed (acid reflux).     polyethylene glycol powder (GLYCOLAX/MIRALAX) 17 GM/SCOOP powder Take 17 g by mouth daily as needed for constipation.     pravastatin (PRAVACHOL) 40 MG tablet TAKE ONE TABLET BY MOUTH DAILY AT 5PM (VIAL) 90 tablet  1   sertraline (ZOLOFT) 100 MG tablet Take 100 mg by mouth daily.     sertraline (ZOLOFT) 50 MG tablet Take 1 tablet (50 mg total) by mouth daily. Take with the 100mg  dose daily. 90 tablet 1   valsartan (DIOVAN) 320 MG tablet TAKE 1 TABLET BY MOUTH DAILY 100 tablet 2   Vitamin D, Ergocalciferol, (DRISDOL) 1.25 MG (50000 UNIT) CAPS capsule Take 50,000 Units by mouth every 7 (seven) days.     zolpidem (AMBIEN CR) 12.5 MG CR tablet Take 1 tablet (12.5 mg total) by mouth at bedtime. 90 tablet 1   No current facility-administered medications for this visit.    REVIEW OF SYSTEMS:  [X]  denotes positive finding, [ ]  denotes negative finding Cardiac  Comments:  Chest pain or chest pressure:    Shortness of breath upon exertion:    Short of breath when lying flat:    Irregular heart rhythm:        Vascular    Pain in calf, thigh, or hip brought on by ambulation: x Both legs  Pain in feet at night that wakes you up from your sleep:     Blood clot in your veins:    Leg swelling:         Pulmonary    Oxygen at home:    Productive cough:     Wheezing:         Neurologic    Sudden weakness in arms or legs:     Sudden numbness in arms or legs:     Sudden onset of difficulty  speaking or slurred speech:    Temporary loss of vision in one eye:     Problems with dizziness:         Gastrointestinal    Blood in stool:     Vomited blood:         Genitourinary    Burning when urinating:     Blood in urine:        Psychiatric    Major depression:         Hematologic    Bleeding problems:    Problems with blood clotting too easily:        Skin    Rashes or ulcers:        Constitutional    Fever or chills:      PHYSICAL EXAM: Vitals:   10/02/21 1025  BP: (!) 147/71  Pulse: 67  Resp: 14  Temp: (!) 97 F (36.1 C)  TempSrc: Temporal  SpO2: 95%  Weight: 141 lb (64 kg)  Height: 4\' 11"  (1.499 m)    GENERAL: The patient is a well-nourished female, in no acute distress. The vital signs are documented above. CARDIAC: There is a regular rate and rhythm.  VASCULAR:  Palpable femoral pulses bilaterally No palpable pedal pulses No lower extremity tissue loss PULMONARY: No respiratory distress. ABDOMEN: Soft and non-tender. MUSCULOSKELETAL: There are no major deformities or cyanosis. NEUROLOGIC: No focal weakness or paresthesias are detected. SKIN: There are no ulcers or rashes noted. PSYCHIATRIC: The patient has a normal affect.  DATA:   Carotid duplex today shows minimal 1 to 39% stenosis on the right and no significant disease on the left  ABIs today are 0.72 on the right and 0.6 on the left  Assessment/Plan:  71 year old female that has been under the care of Dr. [name] and has undergone bilateral carotid endarterectomies that presents for surveillance of her PAD and carotid artery disease.  Discussed no  significant stenosis on carotid duplex today.  Her main complaint is burning in the right calf after walking 15 to 20 minutes consistent with claudication.  I discussed if she is walking 15 to 20 minutes without stopping that is pretty good and I would not recommend intervention until this becomes lifestyle limiting.  I discussed aspirin statin  which she is taking as well as walking therapies.  I will see her in 6 months with repeat ABIs.  Again no evidence of critical limb ischemia and I do not think her claudication is disabling at this time.  She has known single-vessel peroneal runoff bilaterally and has previously been under the care of Dr. [name].  Repeat carotid duplex in 1 year.  Marty Heck, MD [facility] Office: [phone]

## 2021-10-02 NOTE — Telephone Encounter (Signed)
so I thought all of her meds except for the Hydrocodone was going to [facility] now? I wonder if [facility]  just sent that without [facility] knowing?

## 2021-10-02 NOTE — Telephone Encounter (Signed)
Pt states: -[facility] pharmacy sent a Prior Authorization for Ambien.  Explained that the office needs 5 days to process Pas once received. At this time receipt of PA could not be confirmed.  Pt stated understanding.

## 2021-10-02 NOTE — Telephone Encounter (Signed)
PA paperwork from [facility] was received and placed in Provider's box.

## 2021-10-08 ENCOUNTER — Other Ambulatory Visit: Payer: Self-pay

## 2021-10-08 DIAGNOSIS — I739 Peripheral vascular disease, unspecified: Secondary | ICD-10-CM

## 2021-10-09 ENCOUNTER — Other Ambulatory Visit: Payer: Medicare Other

## 2021-10-22 ENCOUNTER — Other Ambulatory Visit: Payer: Self-pay | Admitting: Physician Assistant

## 2021-10-22 ENCOUNTER — Telehealth: Payer: Self-pay | Admitting: Family

## 2021-10-22 DIAGNOSIS — G4700 Insomnia, unspecified: Secondary | ICD-10-CM

## 2021-10-22 MED ORDER — ZOLPIDEM TARTRATE ER 12.5 MG PO TBCR: 12.5000 mg | EXTENDED_RELEASE_TABLET | Freq: Every day | ORAL | 1 refills | Status: DC

## 2021-10-22 NOTE — Telephone Encounter (Signed)
LVM letting pt know she has refills at her pharmacy.

## 2021-10-22 NOTE — Telephone Encounter (Signed)
Joanne Morris with [facility] requests to be called at ph# [phone] regarding: Joanne Morris states the RX for Ambien has always been sent to [facility] in [place] never filled the RX for the medication Ambien.  Joanne Morris further states Patient is out of the above medication and requests a new RX for Ambien be sent to:  [id number] [address] [place] Phone:  [phone]  Fax:  [phone]

## 2021-10-22 NOTE — Telephone Encounter (Signed)
Patient requests to be called at ph# [phone] to confirm RX has been sent to [facility] on [address] for zolpidem (AMBIEN CR) 12.5 MG CR tablet   Patient states she has been out of the above listed medication for 2 days

## 2021-10-22 NOTE — Addendum Note (Signed)
Addended byJeanie Sewer on: 10/22/2021 09:12 PM   Modules accepted: Orders

## 2021-10-22 NOTE — Telephone Encounter (Signed)
  LAST APPOINTMENT DATE:  09/11/21  NEXT APPOINTMENT DATE:12/28/21  MEDICATION: zolpidem (AMBIEN CR) 12.5 MG CR tablet   Is the patient out of medication?  Yes  [facility]ARMACY:[id number] [address], [place] [address]  [place], [place] [address]  Phone:  [phone]  Fax:  [phone]

## 2021-10-22 NOTE — Telephone Encounter (Signed)
Patient returned call in regards to prescription. I read patient the message below and she verbalized understanding.   Patient states: -She wants to ensure that the [facility] not [facility].   Patient requests: - A callback from clinical team to confirm this information.

## 2021-10-23 NOTE — Telephone Encounter (Signed)
I called pt and pt stated the pharmacy received RX lastnight.

## 2021-10-31 ENCOUNTER — Telehealth: Payer: Self-pay | Admitting: Family

## 2021-10-31 DIAGNOSIS — G894 Chronic pain syndrome: Secondary | ICD-10-CM

## 2021-10-31 MED ORDER — HYDROCODONE-ACETAMINOPHEN 5-325 MG PO TABS: 1.0000 | ORAL_TABLET | Freq: Every day | ORAL | 0 refills | Status: DC | PRN

## 2021-10-31 NOTE — Telephone Encounter (Signed)
  LAST APPOINTMENT DATE:  09/11/21  NEXT APPOINTMENT DATE: 12/18/21  MEDICATION:HYDROcodone-acetaminophen (NORCO/VICODIN) 5-325 MG tablet [  Is the patient out of medication? Will run out on 09/23  PHARMACY: [facility] [place], [address]  [place], [place] [address]  Phone:  [phone]  Fax:  [phone]

## 2021-10-31 NOTE — Telephone Encounter (Signed)
RX sent

## 2021-11-15 ENCOUNTER — Other Ambulatory Visit: Payer: Self-pay | Admitting: Family

## 2021-11-20 ENCOUNTER — Other Ambulatory Visit: Payer: Self-pay | Admitting: Family

## 2021-11-22 ENCOUNTER — Telehealth: Payer: Self-pay | Admitting: Family

## 2021-11-22 NOTE — Telephone Encounter (Signed)
Caller was Gregor Hams from [employer] states: - He was calling to clarify dose of Niacin that patient is taking   Caller requests: -A call back with confirmation at [phone]

## 2021-11-23 NOTE — Telephone Encounter (Signed)
Pt is aware of Insurance not covering this medication and has been choosing to pay out of pocket.

## 2021-11-23 NOTE — Telephone Encounter (Signed)
I called pharmacy back to clarify pt's RX. Pharmacist gave a verbalized understanding.

## 2021-11-23 NOTE — Telephone Encounter (Signed)
Joanne Morris with [facility] states PA is required for RX for Zolpidem (Ambien) -fax PA request was faxed on 09/24/21 and 10/23/21 to fax# [phone].  Joanne Morris states PA has not been received to date.  Joanne Morris requests to be called at ph# [phone]  (direct line) for status of PA.

## 2021-12-10 ENCOUNTER — Telehealth: Payer: Self-pay | Admitting: Family

## 2021-12-10 DIAGNOSIS — G894 Chronic pain syndrome: Secondary | ICD-10-CM

## 2021-12-10 NOTE — Telephone Encounter (Signed)
..   Encourage patient to contact the pharmacy for refills or they can request refills through [place]:    NEXT APPOINTMENT DATE: 12/18/21  MEDICATION: HYDROcodone-acetaminophen (NORCO/VICODIN) 5-325 MG tablet   Is the patient out of medication?   PHARMACY:   [facility] [place], [place] AT [address] Phone: [phone]  Fax: [phone]      Let patient know to contact pharmacy at the end of the day to make sure medication is ready.  Please notify patient to allow 48-72 hours to process

## 2021-12-11 MED ORDER — HYDROCODONE-ACETAMINOPHEN 5-325 MG PO TABS: 1.0000 | ORAL_TABLET | Freq: Every day | ORAL | 0 refills | Status: DC | PRN

## 2021-12-11 NOTE — Telephone Encounter (Signed)
RX sent

## 2021-12-14 ENCOUNTER — Telehealth: Payer: Self-pay | Admitting: Family

## 2021-12-14 DIAGNOSIS — G4700 Insomnia, unspecified: Secondary | ICD-10-CM

## 2021-12-18 ENCOUNTER — Ambulatory Visit (INDEPENDENT_AMBULATORY_CARE_PROVIDER_SITE_OTHER): Payer: Medicare Other | Admitting: Family

## 2021-12-18 ENCOUNTER — Encounter: Payer: Self-pay | Admitting: Family

## 2021-12-18 VITALS — BP 130/74 | HR 70 | Temp 97.3°F | Ht 59.0 in | Wt 146.5 lb

## 2021-12-18 DIAGNOSIS — E119 Type 2 diabetes mellitus without complications: Secondary | ICD-10-CM

## 2021-12-18 DIAGNOSIS — G894 Chronic pain syndrome: Secondary | ICD-10-CM

## 2021-12-18 DIAGNOSIS — F5101 Primary insomnia: Secondary | ICD-10-CM | POA: Diagnosis not present

## 2021-12-18 DIAGNOSIS — Z1211 Encounter for screening for malignant neoplasm of colon: Secondary | ICD-10-CM

## 2021-12-18 DIAGNOSIS — Z8639 Personal history of other endocrine, nutritional and metabolic disease: Secondary | ICD-10-CM | POA: Diagnosis not present

## 2021-12-18 DIAGNOSIS — G4700 Insomnia, unspecified: Secondary | ICD-10-CM | POA: Diagnosis not present

## 2021-12-18 DIAGNOSIS — Z23 Encounter for immunization: Secondary | ICD-10-CM | POA: Diagnosis not present

## 2021-12-18 DIAGNOSIS — Z1231 Encounter for screening mammogram for malignant neoplasm of breast: Secondary | ICD-10-CM

## 2021-12-18 DIAGNOSIS — K5903 Drug induced constipation: Secondary | ICD-10-CM

## 2021-12-18 DIAGNOSIS — I10 Essential (primary) hypertension: Secondary | ICD-10-CM | POA: Diagnosis not present

## 2021-12-18 LAB — CBC WITH DIFFERENTIAL/PLATELET
Basophils Absolute: 0.1 K/uL (ref 0.0–0.1)
Basophils Relative: 0.7 % (ref 0.0–3.0)
Eosinophils Absolute: 0.2 K/uL (ref 0.0–0.7)
Eosinophils Relative: 2.3 % (ref 0.0–5.0)
HCT: 39.2 % (ref 36.0–46.0)
Hemoglobin: 12.3 g/dL (ref 12.0–15.0)
Lymphocytes Relative: 41.6 % (ref 12.0–46.0)
Lymphs Abs: 3 K/uL (ref 0.7–4.0)
MCHC: 31.5 g/dL (ref 30.0–36.0)
MCV: 77.5 fl — ABNORMAL LOW (ref 78.0–100.0)
Monocytes Absolute: 0.4 K/uL (ref 0.1–1.0)
Monocytes Relative: 6.1 % (ref 3.0–12.0)
Neutro Abs: 3.6 K/uL (ref 1.4–7.7)
Neutrophils Relative %: 49.3 % (ref 43.0–77.0)
Platelets: 341 K/uL (ref 150.0–400.0)
RBC: 5.06 Mil/uL (ref 3.87–5.11)
RDW: 17.7 % — ABNORMAL HIGH (ref 11.5–15.5)
WBC: 7.3 K/uL (ref 4.0–10.5)

## 2021-12-18 LAB — COMPREHENSIVE METABOLIC PANEL WITH GFR
ALT: 17 U/L (ref 0–35)
AST: 21 U/L (ref 0–37)
Albumin: 4.5 g/dL (ref 3.5–5.2)
Alkaline Phosphatase: 77 U/L (ref 39–117)
BUN: 17 mg/dL (ref 6–23)
CO2: 28 meq/L (ref 19–32)
Calcium: 10 mg/dL (ref 8.4–10.5)
Chloride: 103 meq/L (ref 96–112)
Creatinine, Ser: 1.28 mg/dL — ABNORMAL HIGH (ref 0.40–1.20)
GFR: 42.25 mL/min — ABNORMAL LOW
Glucose, Bld: 152 mg/dL — ABNORMAL HIGH (ref 70–99)
Potassium: 4.2 meq/L (ref 3.5–5.1)
Sodium: 139 meq/L (ref 135–145)
Total Bilirubin: 0.3 mg/dL (ref 0.2–1.2)
Total Protein: 7.4 g/dL (ref 6.0–8.3)

## 2021-12-18 MED ORDER — GLIPIZIDE ER 5 MG PO TB24: 5.0000 mg | ORAL_TABLET | Freq: Every day | ORAL | 1 refills | Status: DC

## 2021-12-18 MED ORDER — METOPROLOL TARTRATE 100 MG PO TABS: 100.0000 mg | ORAL_TABLET | Freq: Two times a day (BID) | ORAL | 1 refills | Status: DC

## 2021-12-18 MED ORDER — LINZESS 145 MCG PO CAPS: 145.0000 ug | ORAL_CAPSULE | Freq: Every day | ORAL | 1 refills | Status: DC

## 2021-12-18 MED ORDER — ZOLPIDEM TARTRATE ER 12.5 MG PO TBCR: 12.5000 mg | EXTENDED_RELEASE_TABLET | Freq: Every day | ORAL | 1 refills | Status: DC

## 2021-12-18 MED ORDER — VALSARTAN 320 MG PO TABS: 320.0000 mg | ORAL_TABLET | Freq: Every day | ORAL | 1 refills | Status: DC

## 2021-12-18 MED ORDER — HYDROCODONE-ACETAMINOPHEN 5-325 MG PO TABS: 1.0000 | ORAL_TABLET | Freq: Every day | ORAL | 0 refills | Status: DC | PRN

## 2021-12-18 NOTE — Assessment & Plan Note (Signed)
Chronic  A1C today 6.2 stable - pt taking meds daily, not checking CBGs at home refilling Glipizide 5mg  qd f/u in 3mos, recheck A1C

## 2021-12-18 NOTE — Assessment & Plan Note (Signed)
chronic r/t hydrocodone & other meds pt taking Linzess 175mcg qd and tolerating sending refill  f/u 6 mos

## 2021-12-18 NOTE — Progress Notes (Signed)
Patient ID: Joanne Morris, female    DOB: 09-14-1950, 71 y.o.   MRN: [id number]  Chief Complaint  Patient presents with   Insomnia    recheck   Diabetes    HPI: Hypertension: Patient is currently maintained on the following medications for blood pressure: Valsartan, Hydralyzine, Metoprolol, Amlodipine Failed meds include: none Patient reports good compliance with blood pressure medications. Patient denies chest pain, headaches, shortness of breath or swelling. Last 3 blood pressure readings in our office are as follows: BP Readings from Last 3 Encounters:  12/18/21 130/74  10/02/21 (!) 147/71  09/11/21 125/62  PAD: DX in 2015, stents placed in right lower leg, has taken Pletal bid since. pt c/o on & off pain in lower leg & foot since then when walking, reports worsening pain over last month or so. Denies burning, numbness or tingling. Followed by Vascular & vein office in [place]. INSOMNIA:  How long: years. Difficulty initiating sleep: yes.  Difficulty maintaining sleep: yes.  OTC meds tried: Benadryl, Melatonin. RX meds in past: Trazodone.  Sleep hygiene measures: yes. Started any new meds recently: no. Shift worker: no. New stressors: no. Currently taking Zolpidem qhs, working well, denies any SE. 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 Last GFR 46. Denies hypoglycemia. Home glucose readings range: does not check at home Pain She reports chronic right side pain. was not an injury that may have caused the pain. The pain started about a year ago and is staying constant. The pain does not radiate. The pain is described as aching and soreness, is moderate in intensity, occurring intermittently. Symptoms are worse in the: evening  Aggravating factors: standing and walking She has tried application of heat, application of ice, NSAIDs, and prescription pain  relievers with moderate relief.   Assessment & Plan:   Problem List Items Addressed This Visit       Cardiovascular and Mediastinum   HTN (hypertension) - Primary   Relevant Orders   Comp Met (CMET) (Completed)     Digestive   Drug-induced constipation    chronic r/t hydrocodone & other meds pt taking Linzess 116mg qd and tolerating sending refill  f/u 6 mos      Relevant Medications   LINZESS 145 MCG CAPS capsule     Endocrine   DM2 (diabetes mellitus, type 2) (HCC) (Chronic)    Chronic  A1C today 6.2 stable - pt taking meds daily, not checking CBGs at home refilling Glipizide 542mqd f/u in 28m57morecheck A1C      Relevant Medications   glipiZIDE (GLUCOTROL XL) 5 MG 24 hr tablet   Other Relevant Orders   Comp Met (CMET) (Completed)     Other   Chronic pain syndrome   Relevant Medications   HYDROcodone-acetaminophen (NORCO/VICODIN) 5-325 MG tablet   Insomnia    Chronic Stable on Ambien 12.5mg45m She states [facility] charges her over $100, but [facility] sending refill Follow-up in 3 months, in office or virtual       Relevant Medications   zolpidem (AMBIEN CR) 12.5 MG CR tablet   Other Visit Diagnoses     Encounter for screening mammogram for breast cancer       Relevant Orders   MM Digital Screening   Colon cancer screening       Relevant Orders   Ambulatory referral to Gastroenterology  History of iron deficiency       Relevant Orders   CBC with Differential/Platelet (Completed)   Need for immunization against influenza       Relevant Orders   Flu Vaccine QUAD High Dose(Fluad) (Completed)      Subjective:    Outpatient Medications Prior to Visit  Medication Sig Dispense Refill   amLODipine (NORVASC) 10 MG tablet TAKE ONE TABLET BY MOUTH DAILY AT 9AM (VIAL) 90 tablet 0   aspirin EC 81 MG tablet Take 81 mg by mouth daily.     CALCIUM-VITAMIN D PO Take 1 tablet by mouth daily.     cilostazol (PLETAL) 100 MG tablet  TAKE 1 TABLET (100 MG TOTAL) BY MOUTH 2 (TWO) TIMES DAILY BEFORE A MEAL. 180 tablet 3   FARXIGA 5 MG TABS tablet TAKE ONE TABLET BY MOUTH DAILY AT 9AM IN THE MORNING (VIAL) 30 tablet 2   hydrALAZINE (APRESOLINE) 50 MG tablet Take 50 mg by mouth 3 (three) times daily.     levocetirizine (XYZAL) 5 MG tablet Take 1 tablet (5 mg total) by mouth daily as needed for allergies. 90 tablet 1   niacin (NIASPAN) 500 MG CR tablet TAKE 1 TABLET BY MOUTH DAILY 100 tablet 2   polyethylene glycol powder (GLYCOLAX/MIRALAX) 17 GM/SCOOP powder Take 17 g by mouth daily as needed for constipation.     pravastatin (PRAVACHOL) 40 MG tablet TAKE ONE TABLET BY MOUTH DAILY AT 5PM (VIAL) 90 tablet 1   sertraline (ZOLOFT) 100 MG tablet Take 100 mg by mouth daily.     sertraline (ZOLOFT) 50 MG tablet Take 1 tablet (50 mg total) by mouth daily. Take with the 128m dose daily. 90 tablet 1   Vitamin D, Ergocalciferol, (DRISDOL) 1.25 MG (50000 UNIT) CAPS capsule Take 50,000 Units by mouth every 7 (seven) days.     cyclobenzaprine (FLEXERIL) 10 MG tablet Take 10 mg by mouth 2 (two) times daily.     glipiZIDE (GLUCOTROL XL) 5 MG 24 hr tablet TAKE ONE TABLET BY MOUTH DAILY AT 9AM IN THE MORNING (VIAL) 30 tablet 2   HYDROcodone-acetaminophen (NORCO/VICODIN) 5-325 MG tablet Take 1 tablet by mouth daily as needed for moderate pain. 30 tablet 0   LINZESS 145 MCG CAPS capsule Take 145 mcg by mouth daily.     metoprolol tartrate (LOPRESSOR) 100 MG tablet TAKE 1 TABLET BY MOUTH TWICE A DAY 30 tablet 0   omeprazole (PRILOSEC) 20 MG capsule Take 20 mg by mouth daily as needed (acid reflux).     valsartan (DIOVAN) 320 MG tablet TAKE 1 TABLET BY MOUTH DAILY 100 tablet 2   zolpidem (AMBIEN CR) 12.5 MG CR tablet Take 1 tablet (12.5 mg total) by mouth at bedtime. 90 tablet 1   Cholecalciferol (VITAMIN D3) 50 MCG (2000 UT) capsule Take 1 capsule (2,000 Units total) by mouth daily. 90 capsule 1   ferrous sulfate 325 (65 FE) MG EC tablet Take 325 mg  by mouth daily with breakfast.     No facility-administered medications prior to visit.   Past Medical History:  Diagnosis Date   Abdominal pain 10/11/2012   Anemia 05/31/2011   Angina    Anxiety    Arthritis    "knees" (07/20/2013)   Atypical chest pain 05/31/2011   Carotid stenosis, asymptomatic 04/18/2014   Claudication ([place]    COVID-19 virus infection 08/01/2018   Depression    GERD (gastroesophageal reflux disease)    Hematoma of neck 04/23/2014   High cholesterol  Hyperlipemia 05/31/2011   Hypokalemia 05/31/2011   Leucocytosis 05/31/2011   Myocardial infarction [facility]) 1990's   "1"   Neck pain on right side 10/04/2014   PAD (peripheral artery disease) ([place])    Peripheral arterial disease ([place]) 06/08/2013   Peripheral arterial disease   Radiculopathy of leg 10/11/2012   Stroke [facility])    "mini stroke 1st then regular stroke", denies residual on 07/20/2013   Wears glasses    Past Surgical History:  Procedure Laterality Date   ABDOMINAL HYSTERECTOMY     ATHERECTOMY  10/05/2013   BALLOON ANGIOPLASTY, ARTERY  10/05/2013   DR [name]   CAROTID ENDARTERECTOMY     CORONARY ANGIOPLASTY WITH STENT PLACEMENT     "1"   ENDARTERECTOMY Left 02/07/2014   Procedure: ENDARTERECTOMY CAROTID;  Surgeon: Elam Dutch, MD;  Location: [place];  Service: Vascular;  Laterality: Left;   ENDARTERECTOMY Right 04/18/2014   Procedure: ENDARTERECTOMY RIGHT CAROTID;  Surgeon: Elam Dutch, MD;  Location: [place];  Service: Vascular;  Laterality: Right;   ENDARTERECTOMY N/A 04/24/2014   Procedure: IRRIGATION AND DEBRIDEMENT OF RIGHT NECK ;  Surgeon: Elam Dutch, MD;  Location: [place];  Service: Vascular;  Laterality: N/A;   ESOPHAGOGASTRODUODENOSCOPY N/A 10/13/2012   Procedure: ESOPHAGOGASTRODUODENOSCOPY (EGD);  Surgeon: Gatha Mayer, MD;  Location: [facility] ENDOSCOPY;  Service: Endoscopy;  Laterality: N/A;   KENALOG INJECTION Bilateral 08/22/2016   Procedure: KENALOG INJECTION BILATERAL NECK;  Surgeon: Wallace Going, DO;  Location: [place];  Service: Plastics;  Laterality: Bilateral;   LOWER EXTREMITY ANGIOGRAM  07/20/2013   Unsuccessful attempt at crossing the CTO/notes 07/20/2013   LOWER EXTREMITY ANGIOGRAM N/A 07/06/2013   Procedure: LOWER EXTREMITY ANGIOGRAM;  Surgeon: Laverda Page, MD;  Location: [facility] CATH LAB;  Service: Cardiovascular;  Laterality: N/A;   LOWER EXTREMITY ANGIOGRAM N/A 07/20/2013   Procedure: LOWER EXTREMITY ANGIOGRAM;  Surgeon: Laverda Page, MD;  Location: [facility] CATH LAB;  Service: Cardiovascular;  Laterality: N/A;   LOWER EXTREMITY ANGIOGRAM N/A 10/05/2013   Procedure: LOWER EXTREMITY ANGIOGRAM;  Surgeon: Laverda Page, MD;  Location: [facility] CATH LAB;  Service: Cardiovascular;  Laterality: N/A;   MASS EXCISION Right 08/22/2016   Procedure: EXCISION RIGHT NECK KELOID;  Surgeon: Wallace Going, DO;  Location: [place];  Service: Plastics;  Laterality: Right;   MASS EXCISION Right 09/18/2020   Procedure: Excision of right neck keloid;  Surgeon: Wallace Going, DO;  Location: [place];  Service: Plastics;  Laterality: Right;   PATCH ANGIOPLASTY Left 02/07/2014   Procedure: PATCH ANGIOPLASTY Carotid;  Surgeon: Elam Dutch, MD;  Location: [place];  Service: Vascular;  Laterality: Left;   PATCH ANGIOPLASTY Right 04/18/2014   Procedure: PATCH ANGIOPLASTY USING HEMASHIELD 0.8cmx 7.6cm PATCH;  Surgeon: Elam Dutch, MD;  Location: [place];  Service: Vascular;  Laterality: Right;   PERIPHERAL VASCULAR CATHETERIZATION N/A 07/05/2014   Procedure: Lower Extremity Angiography;  Surgeon: [name], MD;  Location: [place] CV LAB;  Service: Cardiovascular;  Laterality: N/A;   TUBAL LIGATION     No Known Allergies    Objective:    Physical Exam Vitals and nursing note reviewed.  Constitutional:      Appearance: Normal appearance.  Cardiovascular:     Rate and Rhythm: Normal rate and regular rhythm.  Pulmonary:     Effort: Pulmonary effort  is normal.     Breath sounds: Normal breath sounds.  Musculoskeletal:  General: Normal range of motion.  Skin:    General: Skin is warm and dry.  Neurological:     Mental Status: She is alert.  Psychiatric:        Mood and Affect: Mood normal.        Behavior: Behavior normal.    BP 130/74 (BP Location: Left Arm, Patient Position: Sitting, Cuff Size: Large)   Pulse 70   Temp (!) 97.3 F (36.3 C) (Temporal)   Ht _0  (1.499 m)   Wt 146 lb 8 oz (66.5 kg)   SpO2 94%   BMI 29.59 kg/m  Wt Readings from Last 3 Encounters:  12/18/21 146 lb 8 oz (66.5 kg)  10/02/21 141 lb (64 kg)  09/11/21 144 lb (65.3 kg)       [name], NP

## 2021-12-18 NOTE — Assessment & Plan Note (Signed)
Chronic Stable on Ambien 12.5mg  ER She states [facility] charges her over $100, but [facility] sending refill Follow-up in 3 months, in office or virtual

## 2021-12-18 NOTE — Patient Instructions (Signed)
It was very nice to see you today!    I have sent in your refills.  I also sent referrals for your Mammogram to be done at [facility] (call them if you don't hear anything by end of next week) and also to Gastroenterology for your last Colonoscopy!  Schedule a 3 month follow up visit today.      PLEASE NOTE:  If you had any lab tests please let us know if you have not heard back within a few days. You may see your results on MyChart before we have a chance to review them but we will give you a call once they are reviewed by [place]. If we ordered any referrals today, please let us know if you have not heard from their office within the next week.

## 2021-12-19 ENCOUNTER — Other Ambulatory Visit: Payer: Self-pay | Admitting: Family

## 2021-12-19 DIAGNOSIS — E119 Type 2 diabetes mellitus without complications: Secondary | ICD-10-CM

## 2021-12-19 DIAGNOSIS — K5903 Drug induced constipation: Secondary | ICD-10-CM

## 2021-12-19 MED ORDER — GLIPIZIDE ER 5 MG PO TB24: 5.0000 mg | ORAL_TABLET | Freq: Every day | ORAL | 1 refills | Status: DC

## 2021-12-19 MED ORDER — LINZESS 145 MCG PO CAPS: 145.0000 ug | ORAL_CAPSULE | Freq: Every day | ORAL | 1 refills | Status: DC

## 2021-12-20 ENCOUNTER — Other Ambulatory Visit: Payer: Self-pay | Admitting: Family

## 2021-12-20 DIAGNOSIS — I739 Peripheral vascular disease, unspecified: Secondary | ICD-10-CM

## 2021-12-23 NOTE — Progress Notes (Signed)
Please call Joanne Morris - let her know her labs look ok, but her kidney function is down, she needs to be sure to drink at least 2 liters of water daily, and avoid NSAIDs - ibuprofen, aleve, etc.

## 2021-12-25 ENCOUNTER — Telehealth: Payer: Self-pay

## 2021-12-25 ENCOUNTER — Telehealth: Payer: Self-pay | Admitting: Family

## 2021-12-25 NOTE — Telephone Encounter (Signed)
Spoke with pt regarding lab results/recommendations. Pt verbally understood. Pt also would like to be put on Xanax bc she stated that Zoloft is not working.   Please Advise

## 2021-12-25 NOTE — Telephone Encounter (Signed)
Patient states: -She believes that her anxiety is getting worse  - Would like to start medication for anxiety   I offered pt an OV but she states she discussed this with PCP during 12/18/21 OV. Please advise.

## 2021-12-25 NOTE — Telephone Encounter (Signed)
Spoke with pt and sent a message to [employer]

## 2022-01-08 ENCOUNTER — Telehealth: Payer: Self-pay | Admitting: Family

## 2022-01-08 DIAGNOSIS — G894 Chronic pain syndrome: Secondary | ICD-10-CM

## 2022-01-08 NOTE — Telephone Encounter (Signed)
  RX for the medication listed below dated 12/18/21 showing as a No Print  LAST APPOINTMENT DATE:  Please schedule appointment if longer than 1 year  12/18/21  NEXT APPOINTMENT DATE:  03/19/22  MEDICATION:  HYDROcodone-acetaminophen (NORCO/VICODIN) 5-325 MG tablet   Is the patient out of medication? Yes  PHARMACY:  [facility] [place], [place] AT [address] Phone: [phone]  Fax: [phone]        Let patient know to contact pharmacy at the end of the day to make sure medication is ready.  Please notify patient to allow 48-72 hours to process

## 2022-01-10 MED ORDER — HYDROCODONE-ACETAMINOPHEN 5-325 MG PO TABS: 1.0000 | ORAL_TABLET | Freq: Every day | ORAL | 0 refills | Status: DC | PRN

## 2022-01-10 NOTE — Addendum Note (Signed)
Addended byJeanie Sewer on: 01/10/2022 10:08 PM   Modules accepted: Orders

## 2022-01-10 NOTE — Telephone Encounter (Signed)
RX has been sent.

## 2022-01-14 ENCOUNTER — Other Ambulatory Visit: Payer: Self-pay | Admitting: Family

## 2022-01-14 DIAGNOSIS — G4700 Insomnia, unspecified: Secondary | ICD-10-CM

## 2022-01-14 DIAGNOSIS — I10 Essential (primary) hypertension: Secondary | ICD-10-CM

## 2022-01-21 ENCOUNTER — Other Ambulatory Visit: Payer: Self-pay | Admitting: Family

## 2022-01-21 DIAGNOSIS — I10 Essential (primary) hypertension: Secondary | ICD-10-CM

## 2022-01-24 ENCOUNTER — Other Ambulatory Visit: Payer: Self-pay | Admitting: Family

## 2022-02-13 ENCOUNTER — Other Ambulatory Visit: Payer: Self-pay | Admitting: Family

## 2022-02-13 DIAGNOSIS — F3341 Major depressive disorder, recurrent, in partial remission: Secondary | ICD-10-CM

## 2022-02-22 ENCOUNTER — Other Ambulatory Visit: Payer: Self-pay | Admitting: Family

## 2022-03-17 ENCOUNTER — Other Ambulatory Visit: Payer: Self-pay | Admitting: Family

## 2022-03-17 DIAGNOSIS — E119 Type 2 diabetes mellitus without complications: Secondary | ICD-10-CM

## 2022-03-19 ENCOUNTER — Ambulatory Visit (INDEPENDENT_AMBULATORY_CARE_PROVIDER_SITE_OTHER): Payer: 59 | Admitting: Family

## 2022-03-19 ENCOUNTER — Encounter: Payer: Self-pay | Admitting: Family

## 2022-03-19 VITALS — BP 121/67 | HR 70 | Temp 97.4°F | Ht 59.0 in | Wt 144.2 lb

## 2022-03-19 DIAGNOSIS — E785 Hyperlipidemia, unspecified: Secondary | ICD-10-CM

## 2022-03-19 DIAGNOSIS — I10 Essential (primary) hypertension: Secondary | ICD-10-CM

## 2022-03-19 DIAGNOSIS — F3341 Major depressive disorder, recurrent, in partial remission: Secondary | ICD-10-CM

## 2022-03-19 DIAGNOSIS — J309 Allergic rhinitis, unspecified: Secondary | ICD-10-CM | POA: Diagnosis not present

## 2022-03-19 DIAGNOSIS — G894 Chronic pain syndrome: Secondary | ICD-10-CM | POA: Diagnosis not present

## 2022-03-19 DIAGNOSIS — E119 Type 2 diabetes mellitus without complications: Secondary | ICD-10-CM

## 2022-03-19 DIAGNOSIS — N1831 Chronic kidney disease, stage 3a: Secondary | ICD-10-CM | POA: Diagnosis not present

## 2022-03-19 MED ORDER — GLIPIZIDE ER 5 MG PO TB24: 5.0000 mg | ORAL_TABLET | Freq: Every day | ORAL | 3 refills | Status: DC

## 2022-03-19 MED ORDER — SERTRALINE HCL 100 MG PO TABS: ORAL_TABLET | ORAL | 3 refills | Status: DC

## 2022-03-19 MED ORDER — DAPAGLIFLOZIN PROPANEDIOL 5 MG PO TABS: ORAL_TABLET | ORAL | 3 refills | Status: DC

## 2022-03-19 MED ORDER — HYDROCODONE-ACETAMINOPHEN 5-325 MG PO TABS: 1.0000 | ORAL_TABLET | Freq: Every day | ORAL | 0 refills | Status: DC | PRN

## 2022-03-19 MED ORDER — SERTRALINE HCL 50 MG PO TABS: ORAL_TABLET | ORAL | 3 refills | Status: DC

## 2022-03-19 MED ORDER — AMLODIPINE BESYLATE 10 MG PO TABS: ORAL_TABLET | ORAL | 3 refills | Status: DC

## 2022-03-19 MED ORDER — LEVOCETIRIZINE DIHYDROCHLORIDE 5 MG PO TABS: 5.0000 mg | ORAL_TABLET | Freq: Every day | ORAL | 3 refills | Status: DC | PRN

## 2022-03-19 MED ORDER — PRAVASTATIN SODIUM 40 MG PO TABS: ORAL_TABLET | ORAL | 3 refills | Status: DC

## 2022-03-19 MED ORDER — NIACIN ER (ANTIHYPERLIPIDEMIC) 500 MG PO TBCR: 500.0000 mg | EXTENDED_RELEASE_TABLET | Freq: Every day | ORAL | 3 refills | Status: DC

## 2022-03-19 NOTE — Assessment & Plan Note (Addendum)
Chronic, stable Takes 1 hydrocodone 5mg  pill daily, denies any side effects Sending refill today, PDMP checked/verified Follow-up in 3 months

## 2022-03-19 NOTE — Progress Notes (Signed)
Patient ID: Joanne Morris, female    DOB: April 30, 1950, 72 y.o.   MRN: [id number]  Chief Complaint  Patient presents with   Medication Refill   Diabetes   Hyperlipidemia   Hypertension   Pain   Allergic Rhinitis    Insomnia   HPI: Hypertension: Patient is currently maintained on the following medications for blood pressure: Valsartan, Hydralyzine, Metoprolol, Amlodipine Failed meds include: none Patient reports good compliance with blood pressure medications. Patient denies chest pain, headaches, shortness of breath or swelling. Last 3 blood pressure readings in our office are as follows: BP Readings from Last 3 Encounters:  03/19/22 121/67  12/18/21 130/74  10/02/21 (!) 147/71  Hyperlipidemia: Patient is currently maintained on the following medication for hyperlipidemia: Pravachol. Patient denies myalgia or other side effects. Patient reports good compliance with low fat/low cholesterol diet.  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 Last GFR 46. Denies hypoglycemia. Home glucose readings range: does not check at home Pain She reports chronic right side pain. was not an injury that may have caused the pain. The pain started about a year ago and is staying constant. The pain does not radiate. The pain is described as aching and soreness, is moderate in intensity, occurring intermittently. Symptoms are worse in the: evening  Aggravating factors: standing and walking She has tried application of heat, application of ice, NSAIDs, and prescription pain relievers with moderate relief.   Assessment & Plan:   Problem List Items Addressed This Visit        Cardiovascular and Mediastinum   HTN (hypertension) - Primary    chronic stable on current med regimen, follows with Cardiology q70m refilling amlodipine today f/u 26mos      Relevant Medications   amLODipine (NORVASC) 10 MG tablet   niacin (VITAMIN B3) 500 MG ER tablet   pravastatin (PRAVACHOL) 40 MG tablet     Respiratory   Chronic allergic rhinitis    stable taking generic Xyzal 5mg  qd, tolerating sending refill f/u 1 yr or prn      Relevant Medications   levocetirizine (XYZAL) 5 MG tablet     Endocrine   DM2 (diabetes mellitus, type 2) (HCC) (Chronic)    Chronic, stable last A1C 6.2 stable - pt taking meds daily, not checking CBGs at home refilling Glipizide 5mg  qd f/u in 6 mos       Relevant Medications   dapagliflozin propanediol (FARXIGA) 5 MG TABS tablet   glipiZIDE (GLUCOTROL XL) 5 MG 24 hr tablet   pravastatin (PRAVACHOL) 40 MG tablet   Other Relevant Orders   HM DIABETES FOOT EXAM (Completed)     Genitourinary   Stage 3a chronic kidney disease ([place])    chronic last GFR 42.25 12/2021 continue to advised pt on drinking enough water, avoid NSAIDs, take BP meds daily Continue Farziga 5mg  qd       Relevant Medications   dapagliflozin propanediol (FARXIGA) 5 MG TABS tablet     Other   Dyslipidemia    chronic, stable Pravastatin 40mg  qd, Niacin 500mg  ER qd last lipids wnl recheck fasting labs next visit sending refills f/u 3 mos  Relevant Medications   niacin (VITAMIN B3) 500 MG ER tablet   pravastatin (PRAVACHOL) 40 MG tablet   Depression    Chronic, stable Taking Zoloft 150 mg daily sending refill today Follow-up in 6 months      Relevant Medications   sertraline (ZOLOFT) 50 MG tablet   sertraline (ZOLOFT) 100 MG tablet   Chronic pain syndrome    Chronic, stable Takes 1 hydrocodone 5mg  pill daily, denies any side effects Sending refill today, PDMP checked/verified Follow-up in 3 months      Relevant Medications    HYDROcodone-acetaminophen (NORCO/VICODIN) 5-325 MG tablet   sertraline (ZOLOFT) 50 MG tablet   sertraline (ZOLOFT) 100 MG tablet   Subjective:    Outpatient Medications Prior to Visit  Medication Sig Dispense Refill   aspirin EC 81 MG tablet Take 81 mg by mouth daily.     CALCIUM-VITAMIN D PO Take 1 tablet by mouth daily.     cilostazol (PLETAL) 100 MG tablet TAKE 1 TABLET (100 MG TOTAL) BY MOUTH 2 (TWO) TIMES DAILY BEFORE A MEAL. 180 tablet 3   hydrALAZINE (APRESOLINE) 50 MG tablet Take 50 mg by mouth 3 (three) times daily.     LINZESS 145 MCG CAPS capsule Take 1 capsule (145 mcg total) by mouth daily. 90 capsule 1   metoprolol tartrate (LOPRESSOR) 100 MG tablet TAKE ONE TABLET BY MOUTH TWICE DAILY @9am  & 5pm (vial) 30 tablet 1   polyethylene glycol powder (GLYCOLAX/MIRALAX) 17 GM/SCOOP powder Take 17 g by mouth daily as needed for constipation.     zolpidem (AMBIEN CR) 12.5 MG CR tablet Take 1 tablet (12.5 mg total) by mouth at bedtime. 90 tablet 1   amLODipine (NORVASC) 10 MG tablet TAKE ONE TABLET BY MOUTH DAILY AT 9AM (VIAL) 90 tablet 11   FARXIGA 5 MG TABS tablet TAKE ONE TABLET BY MOUTH DAILY AT 9AM IN THE MORNING (VIAL) 30 tablet 2   glipiZIDE (GLUCOTROL XL) 5 MG 24 hr tablet TAKE 1 TABLET BY MOUTH DAILY  WITH BREAKFAST 100 tablet 2   HYDROcodone-acetaminophen (NORCO/VICODIN) 5-325 MG tablet Take 1 tablet by mouth daily as needed for moderate pain. 30 tablet 0   levocetirizine (XYZAL) 5 MG tablet Take 1 tablet (5 mg total) by mouth daily as needed for allergies. 90 tablet 1   niacin (NIASPAN) 500 MG CR tablet TAKE 1 TABLET BY MOUTH DAILY 100 tablet 2   pravastatin (PRAVACHOL) 40 MG tablet TAKE ONE TABLET BY MOUTH DAILY AT 5PM (VIAL) 90 tablet 11   sertraline (ZOLOFT) 100 MG tablet TAKE ONE TABLET BY MOUTH DAILY AT 9AM IN ADDITION TO 50 MG TABLET (VIAL) 30 tablet 2   sertraline (ZOLOFT) 50 MG tablet TAKE ONE TABLET BY MOUTH DAILY AT 5PM EVERY EVENING (VIAL) 90 tablet 0   No  facility-administered medications prior to visit.   Past Medical History:  Diagnosis Date   Abdominal pain 10/11/2012   Anemia 05/31/2011   Angina    Anxiety    Arthritis    "knees" (07/20/2013)   Atypical chest pain 05/31/2011   Carotid stenosis, asymptomatic 04/18/2014   Claudication ([place])    COVID-19 virus infection 08/01/2018   Depression    GERD (gastroesophageal reflux disease)    Hematoma of neck 04/23/2014   High cholesterol    Hyperlipemia 05/31/2011   Hypokalemia 05/31/2011   Leucocytosis 05/31/2011   Myocardial infarction [facility]) 1990's   "1"   Neck pain on right side 10/04/2014   PAD (peripheral artery disease) ([place])  Peripheral arterial disease ([place]) 06/08/2013   Peripheral arterial disease   Radiculopathy of leg 10/11/2012   Stroke [facility])    "mini stroke 1st then regular stroke", denies residual on 07/20/2013   Wears glasses    Past Surgical History:  Procedure Laterality Date   ABDOMINAL HYSTERECTOMY     ATHERECTOMY  10/05/2013   BALLOON ANGIOPLASTY, ARTERY  10/05/2013   DR [name]   CAROTID ENDARTERECTOMY     CORONARY ANGIOPLASTY WITH STENT PLACEMENT     "1"   ENDARTERECTOMY Left 02/07/2014   Procedure: ENDARTERECTOMY CAROTID;  Surgeon: Elam Dutch, MD;  Location: [facility] OR;  Service: Vascular;  Laterality: Left;   ENDARTERECTOMY Right 04/18/2014   Procedure: ENDARTERECTOMY RIGHT CAROTID;  Surgeon: Elam Dutch, MD;  Location: [place];  Service: Vascular;  Laterality: Right;   ENDARTERECTOMY N/A 04/24/2014   Procedure: IRRIGATION AND DEBRIDEMENT OF RIGHT NECK ;  Surgeon: Elam Dutch, MD;  Location: [place];  Service: Vascular;  Laterality: N/A;   ESOPHAGOGASTRODUODENOSCOPY N/A 10/13/2012   Procedure: ESOPHAGOGASTRODUODENOSCOPY (EGD);  Surgeon: Gatha Mayer, MD;  Location: [facility] ENDOSCOPY;  Service: Endoscopy;  Laterality: N/A;   KENALOG INJECTION Bilateral 08/22/2016   Procedure: KENALOG INJECTION BILATERAL NECK;  Surgeon: Wallace Going, DO;  Location: [place];  Service: Plastics;  Laterality: Bilateral;   LOWER EXTREMITY ANGIOGRAM  07/20/2013   Unsuccessful attempt at crossing the CTO/notes 07/20/2013   LOWER EXTREMITY ANGIOGRAM N/A 07/06/2013   Procedure: LOWER EXTREMITY ANGIOGRAM;  Surgeon: Laverda Page, MD;  Location: [facility] CATH LAB;  Service: Cardiovascular;  Laterality: N/A;   LOWER EXTREMITY ANGIOGRAM N/A 07/20/2013   Procedure: LOWER EXTREMITY ANGIOGRAM;  Surgeon: Laverda Page, MD;  Location: [facility] CATH LAB;  Service: Cardiovascular;  Laterality: N/A;   LOWER EXTREMITY ANGIOGRAM N/A 10/05/2013   Procedure: LOWER EXTREMITY ANGIOGRAM;  Surgeon: Laverda Page, MD;  Location: [facility] CATH LAB;  Service: Cardiovascular;  Laterality: N/A;   MASS EXCISION Right 08/22/2016   Procedure: EXCISION RIGHT NECK KELOID;  Surgeon: Wallace Going, DO;  Location: [place];  Service: Plastics;  Laterality: Right;   MASS EXCISION Right 09/18/2020   Procedure: Excision of right neck keloid;  Surgeon: Wallace Going, DO;  Location: [place];  Service: Plastics;  Laterality: Right;   PATCH ANGIOPLASTY Left 02/07/2014   Procedure: PATCH ANGIOPLASTY Carotid;  Surgeon: Elam Dutch, MD;  Location: [place];  Service: Vascular;  Laterality: Left;   PATCH ANGIOPLASTY Right 04/18/2014   Procedure: PATCH ANGIOPLASTY USING HEMASHIELD 0.8cmx 7.6cm PATCH;  Surgeon: Elam Dutch, MD;  Location: [place];  Service: Vascular;  Laterality: Right;   PERIPHERAL VASCULAR CATHETERIZATION N/A 07/05/2014   Procedure: Lower Extremity Angiography;  Surgeon: [name], MD;  Location: [place] CV LAB;  Service: Cardiovascular;  Laterality: N/A;   TUBAL LIGATION     No Known Allergies    Objective:    Physical Exam Vitals and nursing note reviewed.  Constitutional:      Appearance: Normal appearance.  Cardiovascular:     Rate and Rhythm: Normal rate and regular rhythm.  Pulmonary:     Effort: Pulmonary effort is normal.     Breath sounds:  Normal breath sounds.  Musculoskeletal:        General: Normal range of motion.  Skin:    General: Skin is warm and dry.  Neurological:     Mental Status: She is alert.  Psychiatric:  Mood and Affect: Mood normal.        Behavior: Behavior normal.   BP 121/67 (BP Location: Left Arm, Patient Position: Sitting, Cuff Size: Large)   Pulse 70   Temp (!) 97.4 F (36.3 C) (Temporal)   Ht 4\' 11"  (1.499 m)   Wt 144 lb 4 oz (65.4 kg)   SpO2 94%   BMI 29.13 kg/m  Wt Readings from Last 3 Encounters:  03/19/22 144 lb 4 oz (65.4 kg)  12/18/21 146 lb 8 oz (66.5 kg)  10/02/21 141 lb (64 kg)       [name], NP

## 2022-03-19 NOTE — Assessment & Plan Note (Signed)
Chronic, stable Taking Zoloft 150 mg daily sending refill today Follow-up in 6 months

## 2022-03-19 NOTE — Assessment & Plan Note (Signed)
stable taking generic Xyzal 5mg  qd, tolerating sending refill f/u 1 yr or prn

## 2022-03-19 NOTE — Assessment & Plan Note (Addendum)
Chronic, stable last A1C 6.2 stable - pt taking meds daily, not checking CBGs at home refilling Glipizide 5mg  qd f/u in 6 mos

## 2022-03-19 NOTE — Assessment & Plan Note (Signed)
chronic stable on current med regimen, follows with Cardiology q33m refilling amlodipine today f/u 82mos

## 2022-03-19 NOTE — Assessment & Plan Note (Signed)
chronic, stable Pravastatin 40mg  qd, Niacin 500mg  ER qd last lipids wnl recheck fasting labs next visit sending refills f/u 3 mos

## 2022-03-19 NOTE — Patient Instructions (Addendum)
It was very nice to see you today!   I have sent in your Hydrocodone to [facility] in [place] and other refills to [facility].  See you again in 3 months!     PLEASE NOTE:  If you had any lab tests please let us know if you have not heard back within a few days. You may see your results on MyChart before we have a chance to review them but we will give you a call once they are reviewed by [place]. If we ordered any referrals today, please let us know if you have not heard from their office within the next week.

## 2022-03-19 NOTE — Assessment & Plan Note (Addendum)
chronic last GFR 42.25 12/2021 continue to advised pt on drinking enough water, avoid NSAIDs, take BP meds daily Continue Farziga 5mg  qd

## 2022-03-22 ENCOUNTER — Telehealth: Payer: Self-pay | Admitting: Family

## 2022-03-22 ENCOUNTER — Other Ambulatory Visit: Payer: Self-pay

## 2022-03-22 DIAGNOSIS — I10 Essential (primary) hypertension: Secondary | ICD-10-CM

## 2022-03-22 MED ORDER — METOPROLOL TARTRATE 100 MG PO TABS: ORAL_TABLET | ORAL | 1 refills | Status: DC

## 2022-03-22 NOTE — Telephone Encounter (Signed)
RX Resent.

## 2022-03-22 NOTE — Telephone Encounter (Signed)
Caller states:  - Metoprolol tartrate 100 mg was sent in but only for a 15 day supply   Caller is requesting new script be sent in for 60 tablet quantity to provide a 30 day supply for pt. This can be faxed or verbally given via calling [phone].

## 2022-03-26 ENCOUNTER — Other Ambulatory Visit: Payer: Self-pay

## 2022-03-26 DIAGNOSIS — I10 Essential (primary) hypertension: Secondary | ICD-10-CM

## 2022-03-26 MED ORDER — METOPROLOL TARTRATE 100 MG PO TABS: ORAL_TABLET | ORAL | 0 refills | Status: DC

## 2022-03-26 NOTE — Telephone Encounter (Signed)
RX sent  

## 2022-03-26 NOTE — Telephone Encounter (Signed)
Patient states: - [facility] states medication won't be there until Friday  - They recommended she call pcp to get a small quantity to last her until Friday   Patient requests [facility] [place] [facility] [place].

## 2022-04-13 DIAGNOSIS — M1711 Unilateral primary osteoarthritis, right knee: Secondary | ICD-10-CM | POA: Diagnosis not present

## 2022-04-13 DIAGNOSIS — M25561 Pain in right knee: Secondary | ICD-10-CM | POA: Diagnosis not present

## 2022-04-17 ENCOUNTER — Telehealth: Payer: Self-pay

## 2022-04-17 NOTE — Telephone Encounter (Signed)
        Patient  visited [facility] on 04/13/2022  for treatment.   Telephone encounter attempt :  1st  A HIPAA compliant voice message was left requesting a return call.  Instructed patient to call back at [phone].   [place] Resource Care Guide   [email or web address]  ?? [phone]   Website: [email or web address]  [email or web address]

## 2022-04-23 ENCOUNTER — Telehealth: Payer: Self-pay

## 2022-04-23 NOTE — Telephone Encounter (Signed)
        Patient  visited [facility] on 04/13/2022  for treatment.   Telephone encounter attempt :  2nd  A HIPAA compliant voice message was left requesting a return call.  Instructed patient to call back at [phone].   [place] Resource Care Guide   [email or web address]  ?? [phone]   Website: [email or web address]  [email or web address]

## 2022-04-25 ENCOUNTER — Telehealth: Payer: Self-pay

## 2022-04-25 NOTE — Telephone Encounter (Signed)
     Patient  visit on 04/13/2022  at [facility] was for treatment.  Have you been able to follow up with your primary care physician? Patient has appointment next month.  The patient was or was not able to obtain any needed medicine or equipment. Patient was able to obtain medication.  Are there diet recommendations that you are having difficulty following? No  Patient expresses understanding of discharge instructions and education provided has no other needs at this time. Yes   [place] Resource Care Guide   [email or web address]  ?? [phone]   Website: [email or web address]  [email or web address]

## 2022-05-07 ENCOUNTER — Telehealth: Payer: Self-pay | Admitting: Family

## 2022-05-07 DIAGNOSIS — G894 Chronic pain syndrome: Secondary | ICD-10-CM

## 2022-05-07 NOTE — Telephone Encounter (Signed)
  Encourage patient to contact the pharmacy for refills or they can request refills through [place]:  Please schedule appointment if longer than 1 year  NEXT APPOINTMENT DATE:  MEDICATION:  HYDROcodone-acetaminophen (NORCO/VICODIN) 5-325 MG tablet   Is the patient out of medication?   PHARMACY: [id number] - [place], [place]   Let patient know to contact pharmacy at the end of the day to make sure medication is ready.  Please notify patient to allow 48-72 hours to process

## 2022-05-08 MED ORDER — HYDROCODONE-ACETAMINOPHEN 5-325 MG PO TABS: 1.0000 | ORAL_TABLET | Freq: Every day | ORAL | 0 refills | Status: DC | PRN

## 2022-05-08 NOTE — Telephone Encounter (Signed)
RX sent

## 2022-05-16 ENCOUNTER — Other Ambulatory Visit: Payer: Self-pay | Admitting: Family

## 2022-05-17 NOTE — Telephone Encounter (Signed)
Please advise if refill is appropriate. You are not original prescriber

## 2022-05-17 NOTE — Telephone Encounter (Signed)
Yes, but put in 2 refills, and call & remind pt to schedule appt with her Cardiologist in the next 1-2 months please.

## 2022-05-23 ENCOUNTER — Telehealth: Payer: Self-pay | Admitting: Family

## 2022-05-23 NOTE — Telephone Encounter (Signed)
Pt would like to get the handicap placard for her car. She would like 2. Please advise.

## 2022-05-23 NOTE — Telephone Encounter (Signed)
Does patient need appt prior to forms being filled out? Please advise

## 2022-05-27 NOTE — Telephone Encounter (Signed)
No, visit not required, I believe Florentina Addison did this? I only give one placard!  Thanks

## 2022-05-28 NOTE — Telephone Encounter (Signed)
2 placard handicap form sent to patient via mail per patient request last week.

## 2022-06-13 ENCOUNTER — Other Ambulatory Visit: Payer: Self-pay | Admitting: Family

## 2022-06-17 ENCOUNTER — Other Ambulatory Visit: Payer: Self-pay | Admitting: Family

## 2022-06-17 DIAGNOSIS — F3341 Major depressive disorder, recurrent, in partial remission: Secondary | ICD-10-CM

## 2022-06-18 ENCOUNTER — Ambulatory Visit (INDEPENDENT_AMBULATORY_CARE_PROVIDER_SITE_OTHER): Payer: 59 | Admitting: Family

## 2022-06-18 ENCOUNTER — Telehealth: Payer: Self-pay | Admitting: Family

## 2022-06-18 VITALS — BP 126/68 | HR 71 | Temp 96.4°F | Ht 59.0 in | Wt 145.2 lb

## 2022-06-18 DIAGNOSIS — G894 Chronic pain syndrome: Secondary | ICD-10-CM

## 2022-06-18 DIAGNOSIS — F411 Generalized anxiety disorder: Secondary | ICD-10-CM | POA: Diagnosis not present

## 2022-06-18 DIAGNOSIS — G4700 Insomnia, unspecified: Secondary | ICD-10-CM | POA: Diagnosis not present

## 2022-06-18 DIAGNOSIS — I10 Essential (primary) hypertension: Secondary | ICD-10-CM

## 2022-06-18 MED ORDER — DICLOFENAC SODIUM 1 % EX GEL: 2.0000 g | Freq: Four times a day (QID) | CUTANEOUS | 3 refills | Status: DC

## 2022-06-18 MED ORDER — HYDROCODONE-ACETAMINOPHEN 5-325 MG PO TABS: 1.0000 | ORAL_TABLET | Freq: Every day | ORAL | 0 refills | Status: DC | PRN

## 2022-06-18 MED ORDER — METOPROLOL TARTRATE 100 MG PO TABS: ORAL_TABLET | ORAL | 1 refills | Status: DC

## 2022-06-18 MED ORDER — BUSPIRONE HCL 5 MG PO TABS: 5.0000 mg | ORAL_TABLET | Freq: Two times a day (BID) | ORAL | 0 refills | Status: DC

## 2022-06-18 MED ORDER — ZOLPIDEM TARTRATE ER 12.5 MG PO TBCR: 12.5000 mg | EXTENDED_RELEASE_TABLET | Freq: Every day | ORAL | 1 refills | Status: DC

## 2022-06-18 NOTE — Assessment & Plan Note (Signed)
chronic stable on current med regimen, follows with Cardiology q96m refilling Metoprolol today f/u 6mos

## 2022-06-18 NOTE — Assessment & Plan Note (Signed)
Chronic, stable Takes 1 hydrocodone 5mg pill daily, denies any side effects Sending refill today, PDMP checked/verified Follow-up in 3 months 

## 2022-06-18 NOTE — Assessment & Plan Note (Signed)
intermittent taking 150mg  of Zoloft but says she still has moments of anxiety and it causes her whole body to ache and she has pain on both sides and down her right leg will try Buspar 5mg  bid  f/u 3 mos or prn

## 2022-06-18 NOTE — Telephone Encounter (Signed)
I called and spoke with pt in regards, pt gave a verbalized understanding.

## 2022-06-18 NOTE — Progress Notes (Signed)
Patient ID: Joanne Morris, female    DOB: 01-Jan-1951, 72 y.o.   MRN: [id number]  Chief Complaint  Patient presents with   Diabetes   Hypertension    HPI: Anxiety and depression:  has been controlled well with zoloft 150mg  qd, but pt reports today she has had increased episodes of anxiety, where she "gets worked up" about something and it creates tension in her body and increased pain. Hypertension: Patient is currently maintained on the following medications for blood pressure: Valsartan, Hydralyzine, Metoprolol, Amlodipine Failed meds include: none Patient reports good compliance with blood pressure medications. Patient denies chest pain, headaches, shortness of breath or swelling. Pain She reports chronic right side pain. was not an injury that may have caused the pain. The pain started about a year ago and is staying constant. The pain does not radiate. The pain is described as aching and soreness, is moderate in intensity, occurring intermittently. Symptoms are worse in the: evening  Aggravating factors: standing and walking She has tried application of heat, application of ice, NSAIDs, and prescription pain relievers with moderate relief.  INSOMNIA:  How long: years. Difficulty initiating sleep: yes.  Difficulty maintaining sleep: yes.  OTC meds tried: Benadryl, Melatonin. RX meds in past: Trazodone.  Sleep hygiene measures: yes. Started any new meds recently: no. Shift worker: no. New stressors: no. Currently taking Zolpidem qhs, working well, denies any SE.  Assessment & Plan:  Generalized anxiety disorder Assessment & Plan: intermittent taking 150mg  of Zoloft but says she still has moments of anxiety and it causes her whole body to ache and she has pain on both sides and down her right leg will try Buspar 5mg  bid  f/u 3 mos or prn  Orders: -     busPIRone HCl; Take 1 tablet (5 mg total) by mouth 2 (two) times daily.  Dispense: 60 tablet; Refill: 0  Insomnia, unspecified  type Assessment & Plan: Chronic Stable on Ambien 12.5mg  [facility] charges her over $100, but [facility] sending refill Follow-up in 3 months, in office or virtual   Orders: -     Zolpidem Tartrate ER; Take 1 tablet (12.5 mg total) by mouth at bedtime.  Dispense: 90 tablet; Refill: 1  Primary hypertension Assessment & Plan: chronic stable on current med regimen, follows with Cardiology q68m refilling Metoprolol today f/u 6mos  Orders: -     Metoprolol Tartrate; TAKE ONE TABLET BY MOUTH TWICE DAILY @9am  & 5pm (vial)  Dispense: 180 tablet; Refill: 1  Chronic pain syndrome Assessment & Plan: Chronic, stable Takes 1 hydrocodone 5mg  pill daily, denies any side effects Sending refill today, PDMP checked/verified Follow-up in 3 months  Orders: -     HYDROcodone-Acetaminophen; Take 1 tablet by mouth daily as needed for moderate pain.  Dispense: 30 tablet; Refill: 0 -     Diclofenac Sodium; Apply 2 g topically 4 (four) times daily.  Dispense: 100 g; Refill: 3   Subjective:    Outpatient Medications Prior to Visit  Medication Sig Dispense Refill   amLODipine (NORVASC) 10 MG tablet TAKE ONE TABLET BY MOUTH DAILY AT 9AM (VIAL) 90 tablet 3   aspirin EC 81 MG tablet Take 81 mg by mouth daily.     CALCIUM-VITAMIN D PO Take 1 tablet by mouth daily.     cilostazol (PLETAL) 100 MG tablet TAKE 1 TABLET (100 MG TOTAL) BY MOUTH 2 (TWO) TIMES DAILY BEFORE A MEAL. 180 tablet 3   dapagliflozin propanediol (FARXIGA)  5 MG TABS tablet TAKE ONE TABLET BY MOUTH DAILY AT 9AM IN THE MORNING (VIAL) 90 tablet 3   glipiZIDE (GLUCOTROL XL) 5 MG 24 hr tablet Take 1 tablet (5 mg total) by mouth daily with breakfast. 90 tablet 3   hydrALAZINE (APRESOLINE) 50 MG tablet TAKE ONE TABLET BY MOUTH THREE TIMES DAILY 90 tablet 2   levocetirizine (XYZAL) 5 MG tablet Take 1 tablet (5 mg total) by mouth daily as needed for allergies. 90 tablet 3   LINZESS 145 MCG CAPS capsule Take 1 capsule  (145 mcg total) by mouth daily. 90 capsule 1   niacin (VITAMIN B3) 500 MG ER tablet Take 1 tablet (500 mg total) by mouth daily. 90 tablet 3   polyethylene glycol powder (GLYCOLAX/MIRALAX) 17 GM/SCOOP powder Take 17 g by mouth daily as needed for constipation.     pravastatin (PRAVACHOL) 40 MG tablet TAKE ONE TABLET BY MOUTH DAILY AT 5PM (VIAL) 90 tablet 3   sertraline (ZOLOFT) 100 MG tablet TAKE ONE TABLET BY MOUTH DAILY AT 9AM IN ADDITION TO 50 MG TABLET (VIAL) 90 tablet 3   sertraline (ZOLOFT) 50 MG tablet TAKE ONE TABLET BY MOUTH DAILY AT 5PM EVERY EVENING (VIAL) 90 tablet 3   HYDROcodone-acetaminophen (NORCO/VICODIN) 5-325 MG tablet Take 1 tablet by mouth daily as needed for moderate pain. 30 tablet 0   metoprolol tartrate (LOPRESSOR) 100 MG tablet TAKE ONE TABLET BY MOUTH TWICE DAILY @9am  & 5pm (vial) 6 tablet 0   zolpidem (AMBIEN CR) 12.5 MG CR tablet Take 1 tablet (12.5 mg total) by mouth at bedtime. 90 tablet 1   No facility-administered medications prior to visit.   Past Medical History:  Diagnosis Date   Abdominal pain 10/11/2012   Anemia 05/31/2011   Angina    Anxiety    Arthritis    "knees" (07/20/2013)   Atypical chest pain 05/31/2011   Carotid stenosis, asymptomatic 04/18/2014   Claudication (HCC)    COVID-19 virus infection 08/01/2018   Depression    GERD (gastroesophageal reflux disease)    Hematoma of neck 04/23/2014   High cholesterol    Hyperlipemia 05/31/2011   Hypokalemia 05/31/2011   Leucocytosis 05/31/2011   Myocardial infarction [facility]) 1990's   "1"   Neck pain on right side 10/04/2014   PAD (peripheral artery disease) (HCC)    Peripheral arterial disease (HCC) 06/08/2013   Peripheral arterial disease   Radiculopathy of leg 10/11/2012   Stroke [facility])    "mini stroke 1st then regular stroke", denies residual on 07/20/2013   Wears glasses    Past Surgical History:  Procedure Laterality Date   ABDOMINAL HYSTERECTOMY     ATHERECTOMY  10/05/2013   BALLOON ANGIOPLASTY,  ARTERY  10/05/2013   DR Jacinto Halim   CAROTID ENDARTERECTOMY     CORONARY ANGIOPLASTY WITH STENT PLACEMENT     "1"   ENDARTERECTOMY Left 02/07/2014   Procedure: ENDARTERECTOMY CAROTID;  Surgeon: Sherren Kerns, MD;  Location: [facility] OR;  Service: Vascular;  Laterality: Left;   ENDARTERECTOMY Right 04/18/2014   Procedure: ENDARTERECTOMY RIGHT CAROTID;  Surgeon: Sherren Kerns, MD;  Location: [facility] OR;  Service: Vascular;  Laterality: Right;   ENDARTERECTOMY N/A 04/24/2014   Procedure: IRRIGATION AND DEBRIDEMENT OF RIGHT NECK ;  Surgeon: Sherren Kerns, MD;  Location: [facility] OR;  Service: Vascular;  Laterality: N/A;   ESOPHAGOGASTRODUODENOSCOPY N/A 10/13/2012   Procedure: ESOPHAGOGASTRODUODENOSCOPY (EGD);  Surgeon: Iva Boop, MD;  Location: [facility] ENDOSCOPY;  Service: Endoscopy;  Laterality: N/A;  KENALOG INJECTION Bilateral 08/22/2016   Procedure: KENALOG INJECTION BILATERAL NECK;  Surgeon: Peggye Form, DO;  Location: [facility];  Service: Plastics;  Laterality: Bilateral;   LOWER EXTREMITY ANGIOGRAM  07/20/2013   Unsuccessful attempt at crossing the CTO/notes 07/20/2013   LOWER EXTREMITY ANGIOGRAM N/A 07/06/2013   Procedure: LOWER EXTREMITY ANGIOGRAM;  Surgeon: Pamella Pert, MD;  Location: [facility] CATH LAB;  Service: Cardiovascular;  Laterality: N/A;   LOWER EXTREMITY ANGIOGRAM N/A 07/20/2013   Procedure: LOWER EXTREMITY ANGIOGRAM;  Surgeon: Pamella Pert, MD;  Location: [facility] CATH LAB;  Service: Cardiovascular;  Laterality: N/A;   LOWER EXTREMITY ANGIOGRAM N/A 10/05/2013   Procedure: LOWER EXTREMITY ANGIOGRAM;  Surgeon: Pamella Pert, MD;  Location: [facility] CATH LAB;  Service: Cardiovascular;  Laterality: N/A;   MASS EXCISION Right 08/22/2016   Procedure: EXCISION RIGHT NECK KELOID;  Surgeon: Peggye Form, DO;  Location: [facility];  Service: Plastics;  Laterality: Right;   MASS EXCISION Right 09/18/2020   Procedure: Excision of right neck keloid;  Surgeon: Peggye Form, DO;  Location: [facility] OR;  Service: Plastics;  Laterality: Right;   PATCH ANGIOPLASTY Left 02/07/2014   Procedure: PATCH ANGIOPLASTY Carotid;  Surgeon: Sherren Kerns, MD;  Location: [facility] OR;  Service: Vascular;  Laterality: Left;   PATCH ANGIOPLASTY Right 04/18/2014   Procedure: PATCH ANGIOPLASTY USING HEMASHIELD 0.8cmx 7.6cm PATCH;  Surgeon: Sherren Kerns, MD;  Location: [facility] OR;  Service: Vascular;  Laterality: Right;   PERIPHERAL VASCULAR CATHETERIZATION N/A 07/05/2014   Procedure: Lower Extremity Angiography;  Surgeon: Yates Decamp, MD;  Location: [facility] INVASIVE CV LAB;  Service: Cardiovascular;  Laterality: N/A;   TUBAL LIGATION     No Known Allergies    Objective:    Physical Exam Vitals and nursing note reviewed.  Constitutional:      Appearance: Normal appearance.  Cardiovascular:     Rate and Rhythm: Normal rate and regular rhythm.  Pulmonary:     Effort: Pulmonary effort is normal.     Breath sounds: Normal breath sounds.  Musculoskeletal:        General: Normal range of motion.  Skin:    General: Skin is warm and dry.  Neurological:     Mental Status: She is alert.  Psychiatric:        Mood and Affect: Mood normal.        Behavior: Behavior normal.    BP 126/68   Pulse 71   Temp (!) 96.4 F (35.8 C) (Temporal)   Ht 4\' 11"  (1.499 m)   Wt 145 lb 3.2 oz (65.9 kg)   SpO2 99%   BMI 29.33 kg/m  Wt Readings from Last 3 Encounters:  06/18/22 145 lb 3.2 oz (65.9 kg)  03/19/22 144 lb 4 oz (65.4 kg)  12/18/21 146 lb 8 oz (66.5 kg)      Dulce Sellar, NP

## 2022-06-18 NOTE — Telephone Encounter (Signed)
Pt is in office today on 06/18/22 and states she did not receive place card form In the mail. New form given today for 2 place cards.

## 2022-06-18 NOTE — Telephone Encounter (Signed)
Patient requests to be called regarding medication

## 2022-06-18 NOTE — Patient Instructions (Signed)
It was very nice to see you today!   I have sent in your refills to the different pharmacies.  I have sent a new medication to your mail order pharmacy called Buspirone, to help with your nerves, you take 1 pill twice a day.  Let me know how this is working for you.  Schedule a 4 month follow up visit today.  Have a great summer!      PLEASE NOTE:  If you had any lab tests please let us know if you have not heard back within a few days. You may see your results on MyChart before we have a chance to review them but we will give you a call once they are reviewed by [place]. If we ordered any referrals today, please let us know if you have not heard from their office within the next week.

## 2022-06-18 NOTE — Assessment & Plan Note (Signed)
Chronic Stable on Ambien 12.5mg  ER [facility] charges her over $100, but [facility] sending refill Follow-up in 3 months, in office or virtual

## 2022-06-20 ENCOUNTER — Telehealth: Payer: Self-pay | Admitting: Family

## 2022-06-20 NOTE — Telephone Encounter (Signed)
The bottom message is an error. They DO have it but they are out of the medication. I let the pt know she would have to call around and find out which pharmacy has it in stock.

## 2022-06-20 NOTE — Telephone Encounter (Signed)
Pt states pharmacy told her that the RX  zolpidem (AMBIEN CR) 12.5 MG CR tablet  Was not received at their pharmacy. Please resend

## 2022-06-21 NOTE — Telephone Encounter (Signed)
Pt called after hours & stated [facility] needs to be called at [phone] regarding medication.

## 2022-06-21 NOTE — Telephone Encounter (Signed)
so if she calls next week and needs sent to another pharmacy, please send request to Dr Ruthine Dose who is covering my messages next week (only!)

## 2022-06-24 DIAGNOSIS — I2510 Atherosclerotic heart disease of native coronary artery without angina pectoris: Secondary | ICD-10-CM | POA: Diagnosis not present

## 2022-06-24 DIAGNOSIS — Z955 Presence of coronary angioplasty implant and graft: Secondary | ICD-10-CM | POA: Diagnosis not present

## 2022-06-24 DIAGNOSIS — Z9889 Other specified postprocedural states: Secondary | ICD-10-CM | POA: Diagnosis not present

## 2022-06-24 DIAGNOSIS — I340 Nonrheumatic mitral (valve) insufficiency: Secondary | ICD-10-CM | POA: Diagnosis not present

## 2022-06-24 DIAGNOSIS — I252 Old myocardial infarction: Secondary | ICD-10-CM | POA: Diagnosis not present

## 2022-07-13 ENCOUNTER — Other Ambulatory Visit: Payer: Self-pay | Admitting: Family

## 2022-07-13 DIAGNOSIS — K5903 Drug induced constipation: Secondary | ICD-10-CM

## 2022-07-13 DIAGNOSIS — I739 Peripheral vascular disease, unspecified: Secondary | ICD-10-CM

## 2022-07-17 ENCOUNTER — Telehealth: Payer: Self-pay | Admitting: Family

## 2022-07-17 DIAGNOSIS — G4700 Insomnia, unspecified: Secondary | ICD-10-CM

## 2022-07-17 NOTE — Telephone Encounter (Signed)
Pt states went to [facility] will [facility] longer be getting [facility] medication at that location. Pt called around [facility] it. Pt requests zolpidem (AMBIEN CR) 12.5 MG CR tablet  be sent to [facility] on [address] in [place].

## 2022-07-18 MED ORDER — ZOLPIDEM TARTRATE ER 12.5 MG PO TBCR
12.5000 mg | EXTENDED_RELEASE_TABLET | Freq: Every day | ORAL | 1 refills | Status: DC
Start: 2022-07-18 — End: 2022-10-22

## 2022-07-18 NOTE — Telephone Encounter (Signed)
RX sent

## 2022-07-18 NOTE — Telephone Encounter (Signed)
Pt was paying out of pocket for medication , insurance does not over.

## 2022-07-18 NOTE — Telephone Encounter (Signed)
Patient called stating pharmacy informed her that a PA is needed for ambien. States she would like this done ASAP since she is out of medication.

## 2022-07-19 NOTE — Telephone Encounter (Signed)
[employer] coupon texted to [place] to use at [employer]. Also will follow up with phone call to see if she received.

## 2022-07-22 ENCOUNTER — Telehealth: Payer: Self-pay

## 2022-07-22 ENCOUNTER — Other Ambulatory Visit (HOSPITAL_COMMUNITY): Payer: Self-pay

## 2022-07-22 NOTE — Telephone Encounter (Signed)
Thank you for update. Will watch for determination.

## 2022-07-22 NOTE — Telephone Encounter (Signed)
I reached out to pt, pt states she was able to pick up RX.

## 2022-07-22 NOTE — Telephone Encounter (Signed)
Pharmacy Patient Advocate Encounter   Received notification that prior authorization for Zolpidem 12.28m ER is required/requested.   PA submitted to [facility] via CoverMyMeds Key # [id number] Status is pending

## 2022-07-23 NOTE — Telephone Encounter (Signed)
Pt pays out of pocket for RX, PA not needed.

## 2022-07-23 NOTE — Telephone Encounter (Signed)
Pharmacy Patient Advocate Encounter  Received notification from [facility] that the request for prior authorization for Zolpidem 12.5mg  ER has been denied due to it is not on your plan's Drug List (formulary). Medication authorization requires the following: (1) You need to try two (2) of these covered drugs: (a) Quviviq. (b) Ramelteon. (2) OR your doctor needs to give [place] specific medical reasons why two (2) of the covered drug(s) are not appropriate for you.Marland Kitchen    Please be advised we currently do not have a Pharmacist to review denials, therefore you will need to process appeals accordingly as needed. Thanks for your support at this time.   You may call [phone] to appeal.

## 2022-07-30 ENCOUNTER — Telehealth: Payer: Self-pay | Admitting: Family

## 2022-07-30 DIAGNOSIS — G894 Chronic pain syndrome: Secondary | ICD-10-CM

## 2022-07-30 MED ORDER — HYDROCODONE-ACETAMINOPHEN 5-325 MG PO TABS
1.0000 | ORAL_TABLET | Freq: Every day | ORAL | 0 refills | Status: DC | PRN
Start: 2022-07-30 — End: 2022-08-13

## 2022-07-30 NOTE — Telephone Encounter (Signed)
RX sent

## 2022-07-30 NOTE — Telephone Encounter (Signed)
Prescription Request  07/30/2022  LOV: 06/18/2022  What is the name of the medication or equipment?  HYDROcodone-acetaminophen (NORCO/VICODIN) 5-325 MG tablet   Have you contacted your pharmacy to request a refill? No   Which pharmacy would you like this sent to? [id number] Rosalita Levan, [place] - [address] AT [facility] [address] [place] [address] Phone: [phone] Fax: [phone]   Patient notified that their request is being sent to the clinical staff for review and that they should receive a response within 2 business days.   Please advise at Mobile [phone] (mobile)

## 2022-08-05 ENCOUNTER — Ambulatory Visit (INDEPENDENT_AMBULATORY_CARE_PROVIDER_SITE_OTHER): Payer: 59

## 2022-08-05 VITALS — Wt 145.0 lb

## 2022-08-05 DIAGNOSIS — Z0000 Encounter for general adult medical examination without abnormal findings: Secondary | ICD-10-CM | POA: Diagnosis not present

## 2022-08-05 DIAGNOSIS — Z1211 Encounter for screening for malignant neoplasm of colon: Secondary | ICD-10-CM

## 2022-08-05 DIAGNOSIS — E2839 Other primary ovarian failure: Secondary | ICD-10-CM

## 2022-08-05 DIAGNOSIS — Z1231 Encounter for screening mammogram for malignant neoplasm of breast: Secondary | ICD-10-CM | POA: Diagnosis not present

## 2022-08-05 NOTE — Patient Instructions (Signed)
[name] , Thank you for taking time to come for your Medicare Wellness Visit. I appreciate your ongoing commitment to your health goals. Please review the following plan we discussed and let me know if I can assist you in the future.   These are the goals we discussed:  Goals      Patient Stated     None at this time     Patient Stated     None at this time         This is a list of the screening recommended for you and due dates:  Health Maintenance  Topic Date Due   Eye exam for diabetics  Never done   Hepatitis C Screening  Never done   DTaP/Tdap/Td vaccine (1 - Tdap) Never done   Zoster (Shingles) Vaccine (1 of 2) Never done   Colon Cancer Screening  Never done   Mammogram  03/31/2014   Pneumonia Vaccine (2 of 2 - PCV) 10/15/2015   DEXA scan (bone density measurement)  Never done   Yearly kidney health urinalysis for diabetes  08/01/2019   Hemoglobin A1C  11/01/2021   Flu Shot  09/12/2022   Yearly kidney function blood test for diabetes  12/19/2022   Complete foot exam   03/20/2023   Medicare Annual Wellness Visit  08/05/2023   HPV Vaccine  Aged Out   COVID-19 Vaccine  Discontinued    Advanced directives: Please bring a copy of your health care power of attorney and living will to the office at your convenience.  Conditions/risks identified: none at this time   Next appointment: Follow up in one year for your annual wellness visit    Preventive Care 65 Years and Older, Female Preventive care refers to lifestyle choices and visits with your health care provider that can promote health and wellness. What does preventive care include? A yearly physical exam. This is also called an annual well check. Dental exams once or twice a year. Routine eye exams. Ask your health care provider how often you should have your eyes checked. Personal lifestyle choices, including: Daily care of your teeth and gums. Regular physical activity. Eating a healthy diet. Avoiding  tobacco and drug use. Limiting alcohol use. Practicing safe sex. Taking low-dose aspirin every day. Taking vitamin and mineral supplements as recommended by your health care provider. What happens during an annual well check? The services and screenings done by your health care provider during your annual well check will depend on your age, overall health, lifestyle risk factors, and family history of disease. Counseling  Your health care provider may ask you questions about your: Alcohol use. Tobacco use. Drug use. Emotional well-being. Home and relationship well-being. Sexual activity. Eating habits. History of falls. Memory and ability to understand (cognition). Work and work Astronomer. Reproductive health. Screening  You may have the following tests or measurements: Height, weight, and BMI. Blood pressure. Lipid and cholesterol levels. These may be checked every 5 years, or more frequently if you are over 11 years old. Skin check. Lung cancer screening. You may have this screening every year starting at age 52 if you have a 30-pack-year history of smoking and currently smoke or have quit within the past 15 years. Fecal occult blood test (FOBT) of the stool. You may have this test every year starting at age 69. Flexible sigmoidoscopy or colonoscopy. You may have a sigmoidoscopy every 5 years or a colonoscopy every 10 years starting at age 46. Hepatitis C blood test.  Hepatitis B blood test. Sexually transmitted disease (STD) testing. Diabetes screening. This is done by checking your blood sugar (glucose) after you have not eaten for a while (fasting). You may have this done every 1-3 years. Bone density scan. This is done to screen for osteoporosis. You may have this done starting at age 59. Mammogram. This may be done every 1-2 years. Talk to your health care provider about how often you should have regular mammograms. Talk with your health care provider about your test  results, treatment options, and if necessary, the need for more tests. Vaccines  Your health care provider may recommend certain vaccines, such as: Influenza vaccine. This is recommended every year. Tetanus, diphtheria, and acellular pertussis (Tdap, Td) vaccine. You may need a Td booster every 10 years. Zoster vaccine. You may need this after age 35. Pneumococcal 13-valent conjugate (PCV13) vaccine. One dose is recommended after age 34. Pneumococcal polysaccharide (PPSV23) vaccine. One dose is recommended after age 29. Talk to your health care provider about which screenings and vaccines you need and how often you need them. This information is not intended to replace advice given to you by your health care provider. Make sure you discuss any questions you have with your health care provider. Document Released: 02/24/2015 Document Revised: 10/18/2015 Document Reviewed: 11/29/2014 Elsevier Interactive Patient Education  2017 [employer].  Fall Prevention in the Home Falls can cause injuries. They can happen to people of all ages. There are many things you can do to make your home safe and to help prevent falls. What can I do on the outside of my home? Regularly fix the edges of walkways and driveways and fix any cracks. Remove anything that might make you trip as you walk through a door, such as a raised step or threshold. Trim any bushes or trees on the path to your home. Use bright outdoor lighting. Clear any walking paths of anything that might make someone trip, such as rocks or tools. Regularly check to see if handrails are loose or broken. Make sure that both sides of any steps have handrails. Any raised decks and porches should have guardrails on the edges. Have any leaves, snow, or ice cleared regularly. Use sand or salt on walking paths during winter. Clean up any spills in your garage right away. This includes oil or grease spills. What can I do in the bathroom? Use night  lights. Install grab bars by the toilet and in the tub and shower. Do not use towel bars as grab bars. Use non-skid mats or decals in the tub or shower. If you need to sit down in the shower, use a plastic, non-slip stool. Keep the floor dry. Clean up any water that spills on the floor as soon as it happens. Remove soap buildup in the tub or shower regularly. Attach bath mats securely with double-sided non-slip rug tape. Do not have throw rugs and other things on the floor that can make you trip. What can I do in the bedroom? Use night lights. Make sure that you have a light by your bed that is easy to reach. Do not use any sheets or blankets that are too big for your bed. They should not hang down onto the floor. Have a firm chair that has side arms. You can use this for support while you get dressed. Do not have throw rugs and other things on the floor that can make you trip. What can I do in the kitchen? Clean up  any spills right away. Avoid walking on wet floors. Keep items that you use a lot in easy-to-reach places. If you need to reach something above you, use a strong step stool that has a grab bar. Keep electrical cords out of the way. Do not use floor polish or wax that makes floors slippery. If you must use wax, use non-skid floor wax. Do not have throw rugs and other things on the floor that can make you trip. What can I do with my stairs? Do not leave any items on the stairs. Make sure that there are handrails on both sides of the stairs and use them. Fix handrails that are broken or loose. Make sure that handrails are as long as the stairways. Check any carpeting to make sure that it is firmly attached to the stairs. Fix any carpet that is loose or worn. Avoid having throw rugs at the top or bottom of the stairs. If you do have throw rugs, attach them to the floor with carpet tape. Make sure that you have a light switch at the top of the stairs and the bottom of the stairs. If  you do not have them, ask someone to add them for you. What else can I do to help prevent falls? Wear shoes that: Do not have high heels. Have rubber bottoms. Are comfortable and fit you well. Are closed at the toe. Do not wear sandals. If you use a stepladder: Make sure that it is fully opened. Do not climb a closed stepladder. Make sure that both sides of the stepladder are locked into place. Ask someone to hold it for you, if possible. Clearly mark and make sure that you can see: Any grab bars or handrails. First and last steps. Where the edge of each step is. Use tools that help you move around (mobility aids) if they are needed. These include: Canes. Walkers. Scooters. Crutches. Turn on the lights when you go into a dark area. Replace any light bulbs as soon as they burn out. Set up your furniture so you have a clear path. Avoid moving your furniture around. If any of your floors are uneven, fix them. If there are any pets around you, be aware of where they are. Review your medicines with your doctor. Some medicines can make you feel dizzy. This can increase your chance of falling. Ask your doctor what other things that you can do to help prevent falls. This information is not intended to replace advice given to you by your health care provider. Make sure you discuss any questions you have with your health care provider. Document Released: 11/24/2008 Document Revised: 07/06/2015 Document Reviewed: 03/04/2014 Elsevier Interactive Patient Education  2017 [employer].

## 2022-08-05 NOTE — Progress Notes (Signed)
Subjective:   Joanne Morris is a 72 y.o. female who presents for Medicare Annual (Subsequent) preventive examination.  Visit Complete: Virtual  I connected with  Joanne Morris on 08/05/22 by a audio enabled telemedicine application and verified that I am speaking with the correct person using two identifiers.  Patient Location: Home  Provider Location: Office/Clinic  I discussed the limitations of evaluation and management by telemedicine. The patient expressed understanding and agreed to proceed.   Review of Systems     Cardiac Risk Factors include: advanced age (>64men, >80 women)     Objective:    Today's Vitals   08/05/22 1452  Weight: 145 lb (65.8 kg)   Body mass index is 29.29 kg/m.     08/05/2022    2:56 PM 08/10/2021    8:59 AM 09/14/2020    3:10 PM 08/18/2018    9:22 AM 08/01/2018    6:00 AM 11/06/2017    7:57 PM 08/22/2016    7:44 AM  Advanced Directives  Does Patient Have a Medical Advance Directive? Yes No No No No No No  Type of Estate agent of [place];Living will        Copy of Healthcare Power of Attorney in Chart? No - copy requested        Would patient like information on creating a medical advance directive?  No - Patient declined Yes (MAU/Ambulatory/Procedural Areas - Information given) No - Patient declined No - Patient declined  No - Patient declined    Current Medications (verified) Outpatient Encounter Medications as of 08/05/2022  Medication Sig   amLODipine (NORVASC) 10 MG tablet TAKE ONE TABLET BY MOUTH DAILY AT 9AM (VIAL)   aspirin EC 81 MG tablet Take 81 mg by mouth daily.   busPIRone (BUSPAR) 5 MG tablet Take 1 tablet (5 mg total) by mouth 2 (two) times daily.   CALCIUM-VITAMIN D PO Take 1 tablet by mouth daily.   cilostazol (PLETAL) 100 MG tablet TAKE ONE TABLET BY MOUTH TWICE DAILY BEFORE A MEAL (VIAL)   dapagliflozin propanediol (FARXIGA) 5 MG TABS tablet TAKE ONE TABLET BY MOUTH DAILY AT 9AM IN THE MORNING (VIAL)    diclofenac Sodium (VOLTAREN) 1 % GEL Apply 2 g topically 4 (four) times daily.   glipiZIDE (GLUCOTROL XL) 5 MG 24 hr tablet Take 1 tablet (5 mg total) by mouth daily with breakfast.   hydrALAZINE (APRESOLINE) 50 MG tablet TAKE ONE TABLET BY MOUTH THREE TIMES DAILY   HYDROcodone-acetaminophen (NORCO/VICODIN) 5-325 MG tablet Take 1 tablet by mouth daily as needed for moderate pain.   levocetirizine (XYZAL) 5 MG tablet Take 1 tablet (5 mg total) by mouth daily as needed for allergies.   LINZESS 145 MCG CAPS capsule TAKE ONE CAPSULE ( TOTAL) BY MOUTH DAILY   metoprolol tartrate (LOPRESSOR) 100 MG tablet TAKE ONE TABLET BY MOUTH TWICE DAILY @9am  & 5pm (vial)   niacin (VITAMIN B3) 500 MG ER tablet Take 1 tablet (500 mg total) by mouth daily.   polyethylene glycol powder (GLYCOLAX/MIRALAX) 17 GM/SCOOP powder Take 17 g by mouth daily as needed for constipation.   pravastatin (PRAVACHOL) 40 MG tablet TAKE ONE TABLET BY MOUTH DAILY AT 5PM (VIAL)   sertraline (ZOLOFT) 100 MG tablet TAKE ONE TABLET BY MOUTH DAILY AT 9AM IN ADDITION TO 50 MG TABLET (VIAL)   sertraline (ZOLOFT) 50 MG tablet TAKE ONE TABLET BY MOUTH DAILY AT 5PM EVERY EVENING (VIAL)   zolpidem (AMBIEN CR) 12.5 MG CR tablet Take 1  tablet (12.5 mg total) by mouth at bedtime.   No facility-administered encounter medications on file as of 08/05/2022.    Allergies (verified) Patient has no known allergies.   History: Past Medical History:  Diagnosis Date   Abdominal pain 10/11/2012   Anemia 05/31/2011   Angina    Anxiety    Arthritis    "knees" (07/20/2013)   Atypical chest pain 05/31/2011   Carotid stenosis, asymptomatic 04/18/2014   Claudication (HCC)    COVID-19 virus infection 08/01/2018   Depression    GERD (gastroesophageal reflux disease)    Hematoma of neck 04/23/2014   High cholesterol    Hyperlipemia 05/31/2011   Hypokalemia 05/31/2011   Leucocytosis 05/31/2011   Myocardial infarction [facility]) 1990's   "1"   Neck pain on right  side 10/04/2014   PAD (peripheral artery disease) (HCC)    Peripheral arterial disease (HCC) 06/08/2013   Peripheral arterial disease   Radiculopathy of leg 10/11/2012   Stroke [facility])    "mini stroke 1st then regular stroke", denies residual on 07/20/2013   Wears glasses    Past Surgical History:  Procedure Laterality Date   ABDOMINAL HYSTERECTOMY     ATHERECTOMY  10/05/2013   BALLOON ANGIOPLASTY, ARTERY  10/05/2013   DR Jacinto Halim   CAROTID ENDARTERECTOMY     CORONARY ANGIOPLASTY WITH STENT PLACEMENT     "1"   ENDARTERECTOMY Left 02/07/2014   Procedure: ENDARTERECTOMY CAROTID;  Surgeon: Sherren Kerns, MD;  Location: [facility] OR;  Service: Vascular;  Laterality: Left;   ENDARTERECTOMY Right 04/18/2014   Procedure: ENDARTERECTOMY RIGHT CAROTID;  Surgeon: Sherren Kerns, MD;  Location: [facility] OR;  Service: Vascular;  Laterality: Right;   ENDARTERECTOMY N/A 04/24/2014   Procedure: IRRIGATION AND DEBRIDEMENT OF RIGHT NECK ;  Surgeon: Sherren Kerns, MD;  Location: [facility] OR;  Service: Vascular;  Laterality: N/A;   ESOPHAGOGASTRODUODENOSCOPY N/A 10/13/2012   Procedure: ESOPHAGOGASTRODUODENOSCOPY (EGD);  Surgeon: Iva Boop, MD;  Location: [facility] ENDOSCOPY;  Service: Endoscopy;  Laterality: N/A;   KENALOG INJECTION Bilateral 08/22/2016   Procedure: KENALOG INJECTION BILATERAL NECK;  Surgeon: Peggye Form, DO;  Location: [facility];  Service: Plastics;  Laterality: Bilateral;   LOWER EXTREMITY ANGIOGRAM  07/20/2013   Unsuccessful attempt at crossing the CTO/notes 07/20/2013   LOWER EXTREMITY ANGIOGRAM N/A 07/06/2013   Procedure: LOWER EXTREMITY ANGIOGRAM;  Surgeon: Pamella Pert, MD;  Location: [facility] CATH LAB;  Service: Cardiovascular;  Laterality: N/A;   LOWER EXTREMITY ANGIOGRAM N/A 07/20/2013   Procedure: LOWER EXTREMITY ANGIOGRAM;  Surgeon: Pamella Pert, MD;  Location: [facility] CATH LAB;  Service: Cardiovascular;  Laterality: N/A;   LOWER EXTREMITY ANGIOGRAM N/A 10/05/2013   Procedure: LOWER  EXTREMITY ANGIOGRAM;  Surgeon: Pamella Pert, MD;  Location: [facility] CATH LAB;  Service: Cardiovascular;  Laterality: N/A;   MASS EXCISION Right 08/22/2016   Procedure: EXCISION RIGHT NECK KELOID;  Surgeon: Peggye Form, DO;  Location: [facility];  Service: Plastics;  Laterality: Right;   MASS EXCISION Right 09/18/2020   Procedure: Excision of right neck keloid;  Surgeon: Peggye Form, DO;  Location: [facility] OR;  Service: Plastics;  Laterality: Right;   PATCH ANGIOPLASTY Left 02/07/2014   Procedure: PATCH ANGIOPLASTY Carotid;  Surgeon: Sherren Kerns, MD;  Location: [facility] OR;  Service: Vascular;  Laterality: Left;   PATCH ANGIOPLASTY Right 04/18/2014   Procedure: PATCH ANGIOPLASTY USING HEMASHIELD 0.8cmx 7.6cm PATCH;  Surgeon: Sherren Kerns, MD;  Location: [facility] OR;  Service:  Vascular;  Laterality: Right;   PERIPHERAL VASCULAR CATHETERIZATION N/A 07/05/2014   Procedure: Lower Extremity Angiography;  Surgeon: Yates Decamp, MD;  Location: [facility] INVASIVE CV LAB;  Service: Cardiovascular;  Laterality: N/A;   TUBAL LIGATION     History reviewed. No pertinent family history. Social History   Socioeconomic History   Marital status: Legally Separated    Spouse name: Not on file   Number of children: Not on file   Years of education: Not on file   Highest education level: Not on file  Occupational History   Not on file  Tobacco Use   Smoking status: Former    Packs/day: 0.50    Years: 4.00    Additional pack years: 0.00    Total pack years: 2.00    Types: Cigarettes    Quit date: 05/30/2009    Years since quitting: 13.1   Smokeless tobacco: Never  Vaping Use   Vaping Use: Never used  Substance and Sexual Activity   Alcohol use: No    Alcohol/week: 0.0 standard drinks of alcohol   Drug use: No   Sexual activity: Yes    Birth control/protection: Post-menopausal  Other Topics Concern   Not on file  Social History Narrative   Not on file   Social Determinants of Health    Financial Resource Strain: Low Risk  (08/05/2022)   Overall Financial Resource Strain (CARDIA)    Difficulty of Paying Living Expenses: Not hard at all  Food Insecurity: No Food Insecurity (08/05/2022)   Hunger Vital Sign    Worried About Running Out of Food in the Last Year: Never true    Ran Out of Food in the Last Year: Never true  Transportation Needs: No Transportation Needs (08/05/2022)   PRAPARE - Administrator, Civil Service (Medical): No    Lack of Transportation (Non-Medical): No  Physical Activity: Insufficiently Active (08/05/2022)   Exercise Vital Sign    Days of Exercise per Week: 3 days    Minutes of Exercise per Session: 10 min  Stress: No Stress Concern Present (08/05/2022)   [employer] of Occupational Health - Occupational Stress Questionnaire    Feeling of Stress : Not at all  Social Connections: Moderately Isolated (08/05/2022)   Social Connection and Isolation Panel [NHANES]    Frequency of Communication with Friends and Family: More than three times a week    Frequency of Social Gatherings with Friends and Family: More than three times a week    Attends Religious Services: More than 4 times per year    Active Member of [employer] or Organizations: No    Attends Banker Meetings: Never    Marital Status: Separated    Tobacco Counseling Counseling given: Not Answered   Clinical Intake:  Pre-visit preparation completed: Yes  Pain : No/denies pain     BMI - recorded: 29.29 Nutritional Status: BMI 25 -29 Overweight Nutritional Risks: None Diabetes: Yes CBG done?: No Did pt. bring in CBG monitor from home?: No  How often do you need to have someone help you when you read instructions, pamphlets, or other written materials from your doctor or pharmacy?: 1 - Never  Interpreter Needed?: No  Information entered by :: Lanier Ensign, LPN   Activities of Daily Living    08/05/2022    2:57 PM 08/10/2021    9:00 AM  In your  present state of health, do you have any difficulty performing the following activities:  Hearing? 0 0  Vision? 0 0  Difficulty concentrating or making decisions? 0 0  Walking or climbing stairs? 0 0  Dressing or bathing? 0 0  Doing errands, shopping? 0 0  Preparing Food and eating ? N N  Using the Toilet? N N  In the past six months, have you accidently leaked urine? N N  Do you have problems with loss of bowel control? N N  Managing your Medications? N N  Managing your Finances? N N  Housekeeping or managing your Housekeeping? N N    Patient Care Team: Dulce Sellar, NP as PCP - General (Family Medicine) Elissa Hefty, NP as Nurse Practitioner Cephus Shelling, MD as Consulting Physician (Vascular Surgery) Tyrone Nine, MD as Attending Physician (Family Medicine)  Indicate any recent Medical Services you may have received from other than [facility] providers in the past year (date may be approximate).     Assessment:   This is a routine wellness examination for Lynn.  Hearing/Vision screen Hearing Screening - Comments:: Pt denies any hearing issues  Vision Screening - Comments:: Pt follows up with [facility] for annual eye exam   Dietary issues and exercise activities discussed:     Goals Addressed             This Visit's Progress    Patient Stated       None at this time        Depression Screen    08/05/2022    2:55 PM 06/18/2022    9:09 AM 03/19/2022    9:31 AM 12/18/2021    9:41 AM 08/10/2021    8:58 AM 12/26/2020    9:04 AM  PHQ 2/9 Scores  PHQ - 2 Score 0 0 0 0 0 0  PHQ- 9 Score 0 0 0 0      Fall Risk    08/05/2022    2:57 PM 03/19/2022    9:33 AM 08/10/2021    9:00 AM 02/27/2021    9:02 AM 12/26/2020    9:04 AM  Fall Risk   Falls in the past year? 0 0 0 0 0  Number falls in past yr: 0 0 0    Injury with Fall? 0 0 0    Risk for fall due to : Impaired balance/gait;Impaired vision No Fall Risks Impaired vision    Follow up Falls prevention  discussed Falls evaluation completed;Education provided Falls prevention discussed      MEDICARE RISK AT HOME:  Medicare Risk at Home - 08/05/22 1457     Any stairs in or around the home? No    If so, are there any without handrails? No    Home free of loose throw rugs in walkways, pet beds, electrical cords, etc? Yes    Adequate lighting in your home to reduce risk of falls? Yes    Life alert? No    Use of a cane, walker or w/c? Yes    Grab bars in the bathroom? Yes    Shower chair or bench in shower? No    Elevated toilet seat or a handicapped toilet? No             TIMED UP AND GO:  Was the test performed?  No    Cognitive Function:        08/05/2022    3:05 PM 08/10/2021    9:01 AM  6CIT Screen  What Year? 0 points 0 points  What month? 0 points 0 points  What time? 0 points  0 points  Count back from 20 0 points 0 points  Months in reverse 4 points 2 points  Repeat phrase 0 points 10 points  Total Score 4 points 12 points    Immunizations Immunization History  Administered Date(s) Administered   Fluad Quad(high Dose 65+) 12/26/2020, 12/18/2021   Influenza-Unspecified 12/26/2020   Moderna Sars-Covid-2 Vaccination 03/06/2020, 04/03/2020   Pneumococcal Polysaccharide-23 07/21/2013    TDAP status: Due, Education has been provided regarding the importance of this vaccine. Advised may receive this vaccine at local pharmacy or Health Dept. Aware to provide a copy of the vaccination record if obtained from local pharmacy or Health Dept. Verbalized acceptance and understanding.  Flu Vaccine status: Up to date  Pneumococcal vaccine status: Due, Education has been provided regarding the importance of this vaccine. Advised may receive this vaccine at local pharmacy or Health Dept. Aware to provide a copy of the vaccination record if obtained from local pharmacy or Health Dept. Verbalized acceptance and understanding.  Covid-19 vaccine status: Completed  vaccines  Qualifies for Shingles Vaccine? Yes   Zostavax completed No   Shingrix Completed?: No.    Education has been provided regarding the importance of this vaccine. Patient has been advised to call insurance company to determine out of pocket expense if they have not yet received this vaccine. Advised may also receive vaccine at local pharmacy or Health Dept. Verbalized acceptance and understanding.  Screening Tests Health Maintenance  Topic Date Due   OPHTHALMOLOGY EXAM  Never done   Hepatitis C Screening  Never done   DTaP/Tdap/Td (1 - Tdap) Never done   Zoster Vaccines- Shingrix (1 of 2) Never done   Colonoscopy  Never done   MAMMOGRAM  03/31/2014   Pneumonia Vaccine 47+ Years old (2 of 2 - PCV) 10/15/2015   DEXA SCAN  Never done   Diabetic kidney evaluation - Urine ACR  08/01/2019   HEMOGLOBIN A1C  11/01/2021   INFLUENZA VACCINE  09/12/2022   Diabetic kidney evaluation - eGFR measurement  12/19/2022   FOOT EXAM  03/20/2023   Medicare Annual Wellness (AWV)  08/05/2023   HPV VACCINES  Aged Out   COVID-19 Vaccine  Discontinued    Health Maintenance  Health Maintenance Due  Topic Date Due   OPHTHALMOLOGY EXAM  Never done   Hepatitis C Screening  Never done   DTaP/Tdap/Td (1 - Tdap) Never done   Zoster Vaccines- Shingrix (1 of 2) Never done   Colonoscopy  Never done   MAMMOGRAM  03/31/2014   Pneumonia Vaccine 45+ Years old (2 of 2 - PCV) 10/15/2015   DEXA SCAN  Never done   Diabetic kidney evaluation - Urine ACR  08/01/2019   HEMOGLOBIN A1C  11/01/2021    Colorectal cancer screening: Referral to GI placed 08/05/22. Pt aware the office will call re: appt.  Mammogram status: Ordered 08/05/22. Pt provided with contact info and advised to call to schedule appt.   Bone Density status: Ordered 08/05/22. Pt provided with contact info and advised to call to schedule appt.   Additional Screening:  Hepatitis C Screening: does qualify  Vision Screening: Recommended annual  ophthalmology exams for early detection of glaucoma and other disorders of the eye. Is the patient up to date with their annual eye exam?  No  Who is the provider or what is the name of the office in which the patient attends annual eye exams? [facility]  If pt is not established with a provider, would they like to be  referred to a provider to establish care? No .   Dental Screening: Recommended annual dental exams for proper oral hygiene  Diabetic Foot Exam: Diabetic Foot Exam: Completed 03/19/22  Community Resource Referral / Chronic Care Management: CRR required this visit?  No   CCM required this visit?  No     Plan:     I have personally reviewed and noted the following in the patient's chart:   Medical and social history Use of alcohol, tobacco or illicit drugs  Current medications and supplements including opioid prescriptions. Patient is currently taking opioid prescriptions. Information provided to patient regarding non-opioid alternatives. Patient advised to discuss non-opioid treatment plan with their provider. Functional ability and status Nutritional status Physical activity Advanced directives List of other physicians Hospitalizations, surgeries, and ER visits in previous 12 months Vitals Screenings to include cognitive, depression, and falls Referrals and appointments  In addition, I have reviewed and discussed with patient certain preventive protocols, quality metrics, and best practice recommendations. A written personalized care plan for preventive services as well as general preventive health recommendations were provided to patient.     Marzella Schlein, LPN   1/61/0960   After Visit Summary: (MyChart) Due to this being a telephonic visit, the after visit summary with patients personalized plan was offered to patient via MyChart   Nurse Notes: none

## 2022-08-06 ENCOUNTER — Ambulatory Visit: Payer: 59 | Admitting: Family

## 2022-08-13 ENCOUNTER — Ambulatory Visit (INDEPENDENT_AMBULATORY_CARE_PROVIDER_SITE_OTHER): Payer: 59 | Admitting: Family

## 2022-08-13 ENCOUNTER — Encounter: Payer: Self-pay | Admitting: Family

## 2022-08-13 VITALS — BP 113/63 | HR 66 | Temp 97.5°F | Ht 59.0 in | Wt 143.2 lb

## 2022-08-13 DIAGNOSIS — F411 Generalized anxiety disorder: Secondary | ICD-10-CM | POA: Diagnosis not present

## 2022-08-13 DIAGNOSIS — G894 Chronic pain syndrome: Secondary | ICD-10-CM | POA: Diagnosis not present

## 2022-08-13 MED ORDER — GABAPENTIN 100 MG PO CAPS
100.0000 mg | ORAL_CAPSULE | Freq: Two times a day (BID) | ORAL | 1 refills | Status: DC
Start: 2022-08-13 — End: 2022-10-01

## 2022-08-13 NOTE — Assessment & Plan Note (Signed)
Chronic, unstable Takes 1 hydrocodone 5mg  pill daily,prn having more pain lately she states r/t her increased her anxiety, causing both sides to hurt with burning radiating down both legs starting Gabapentin 100mg  bid, increase to 200mg  bid after 1 week if needed, hoping also to tx pt anxiety, advised to not take the Hydrocodone with this Follow-up in 1 month, check GFR

## 2022-08-13 NOTE — Progress Notes (Signed)
Patient ID: Joanne Morris, female    DOB: 06-20-1950, 72 y.o.   MRN: [id number]  Chief Complaint  Patient presents with   Muscle Pain    Pt c/o muscle pain down right leg due to medication. Pt forgot to bring medication with her.     HPI: Anxiety and depression:  has been controlled well with zoloft 150mg  qd, but pt reports today she has had increased episodes of anxiety, where she "gets worked up" about something and it creates tension in her body and increased pain. Added Buspar last visit, pt has taken bid for last month but states it is not helping she is still having same sx and she has started having diarrhea with every meal. Pain She reports chronic right side pain. was not an injury that may have caused the pain. The pain started about a year ago and is staying constant. The pain does not radiate. The pain is described as aching and soreness, is moderate in intensity, occurring intermittently. Symptoms are worse in the: evening  Aggravating factors: standing and walking She has tried application of heat, application of ice, NSAIDs, and prescription pain relievers with moderate relief.  Taking Vicodin 5-325mg  qd w/some relief. States she feels the pain more when her anxiety is high.  Assessment & Plan:  Generalized anxiety disorder Assessment & Plan: chronic taking 150mg  of Zoloft & started Buspar 5mg  bid about 6 wks ago, not tolerating, making anxiety a little worse, and her flank pain radiating to both legs advised to stop Buspar, starting Gabapentin 100mg  bid, increase to 200mg  bid after 1 week if needed need to keep eye on GFR, pt reminded to drink more water, avoid NSAIDs f/u 1 mos prn  Orders: -     Gabapentin; Take 1-2 capsules (100-200 mg total) by mouth 2 (two) times daily. AFTER 1 week, you can increase to 2 pills bid if needed for nerve pain or anxiety.  Dispense: 60 capsule; Refill: 1  Chronic pain syndrome Assessment & Plan: Chronic, unstable Takes 1 hydrocodone 5mg   pill daily,prn having more pain lately she states r/t her increased her anxiety, causing both sides to hurt with burning radiating down both legs starting Gabapentin 100mg  bid, increase to 200mg  bid after 1 week if needed, hoping also to tx pt anxiety, advised to not take the Hydrocodone with this Follow-up in 1 month, check GFR  Orders: -     Gabapentin; Take 1-2 capsules (100-200 mg total) by mouth 2 (two) times daily. AFTER 1 week, you can increase to 2 pills bid if needed for nerve pain or anxiety.  Dispense: 60 capsule; Refill: 1   Subjective:    Outpatient Medications Prior to Visit  Medication Sig Dispense Refill   amLODipine (NORVASC) 10 MG tablet TAKE ONE TABLET BY MOUTH DAILY AT 9AM (VIAL) 90 tablet 3   aspirin EC 81 MG tablet Take 81 mg by mouth daily.     CALCIUM-VITAMIN D PO Take 1 tablet by mouth daily.     cilostazol (PLETAL) 100 MG tablet TAKE ONE TABLET BY MOUTH TWICE DAILY BEFORE A MEAL (VIAL) 180 tablet 11   dapagliflozin propanediol (FARXIGA) 5 MG TABS tablet TAKE ONE TABLET BY MOUTH DAILY AT 9AM IN THE MORNING (VIAL) 90 tablet 3   diclofenac Sodium (VOLTAREN) 1 % GEL Apply 2 g topically 4 (four) times daily. 100 g 3   glipiZIDE (GLUCOTROL XL) 5 MG 24 hr tablet Take 1 tablet (5 mg total) by mouth daily with breakfast. 90  tablet 3   hydrALAZINE (APRESOLINE) 50 MG tablet TAKE ONE TABLET BY MOUTH THREE TIMES DAILY 90 tablet 2   levocetirizine (XYZAL) 5 MG tablet Take 1 tablet (5 mg total) by mouth daily as needed for allergies. 90 tablet 3   LINZESS 145 MCG CAPS capsule TAKE ONE CAPSULE ( TOTAL) BY MOUTH DAILY 90 capsule 11   metoprolol tartrate (LOPRESSOR) 100 MG tablet TAKE ONE TABLET BY MOUTH TWICE DAILY @9am  & 5pm (vial) 180 tablet 1   niacin (VITAMIN B3) 500 MG ER tablet Take 1 tablet (500 mg total) by mouth daily. 90 tablet 3   polyethylene glycol powder (GLYCOLAX/MIRALAX) 17 GM/SCOOP powder Take 17 g by mouth daily as needed for constipation.     pravastatin  (PRAVACHOL) 40 MG tablet TAKE ONE TABLET BY MOUTH DAILY AT 5PM (VIAL) 90 tablet 3   sertraline (ZOLOFT) 100 MG tablet TAKE ONE TABLET BY MOUTH DAILY AT 9AM IN ADDITION TO 50 MG TABLET (VIAL) 90 tablet 3   sertraline (ZOLOFT) 50 MG tablet TAKE ONE TABLET BY MOUTH DAILY AT 5PM EVERY EVENING (VIAL) 90 tablet 3   zolpidem (AMBIEN CR) 12.5 MG CR tablet Take 1 tablet (12.5 mg total) by mouth at bedtime. 90 tablet 1   busPIRone (BUSPAR) 5 MG tablet Take 1 tablet (5 mg total) by mouth 2 (two) times daily. 60 tablet 0   HYDROcodone-acetaminophen (NORCO/VICODIN) 5-325 MG tablet Take 1 tablet by mouth daily as needed for moderate pain. 30 tablet 0   No facility-administered medications prior to visit.   Past Medical History:  Diagnosis Date   Abdominal pain 10/11/2012   Anemia 05/31/2011   Angina    Anxiety    Arthritis    "knees" (07/20/2013)   Atypical chest pain 05/31/2011   Carotid stenosis, asymptomatic 04/18/2014   Claudication (HCC)    COVID-19 virus infection 08/01/2018   Depression    GERD (gastroesophageal reflux disease)    Hematoma of neck 04/23/2014   High cholesterol    Hyperlipemia 05/31/2011   Hypokalemia 05/31/2011   Leucocytosis 05/31/2011   Myocardial infarction [facility]) 1990's   "1"   Neck pain on right side 10/04/2014   PAD (peripheral artery disease) (HCC)    Peripheral arterial disease (HCC) 06/08/2013   Peripheral arterial disease   Radiculopathy of leg 10/11/2012   Stroke [facility])    "mini stroke 1st then regular stroke", denies residual on 07/20/2013   Wears glasses    Past Surgical History:  Procedure Laterality Date   ABDOMINAL HYSTERECTOMY     ATHERECTOMY  10/05/2013   BALLOON ANGIOPLASTY, ARTERY  10/05/2013   DR Jacinto Halim   CAROTID ENDARTERECTOMY     CORONARY ANGIOPLASTY WITH STENT PLACEMENT     "1"   ENDARTERECTOMY Left 02/07/2014   Procedure: ENDARTERECTOMY CAROTID;  Surgeon: Sherren Kerns, MD;  Location: [facility] OR;  Service: Vascular;  Laterality: Left;   ENDARTERECTOMY  Right 04/18/2014   Procedure: ENDARTERECTOMY RIGHT CAROTID;  Surgeon: Sherren Kerns, MD;  Location: [facility] OR;  Service: Vascular;  Laterality: Right;   ENDARTERECTOMY N/A 04/24/2014   Procedure: IRRIGATION AND DEBRIDEMENT OF RIGHT NECK ;  Surgeon: Sherren Kerns, MD;  Location: [facility] OR;  Service: Vascular;  Laterality: N/A;   ESOPHAGOGASTRODUODENOSCOPY N/A 10/13/2012   Procedure: ESOPHAGOGASTRODUODENOSCOPY (EGD);  Surgeon: Iva Boop, MD;  Location: [facility] ENDOSCOPY;  Service: Endoscopy;  Laterality: N/A;   KENALOG INJECTION Bilateral 08/22/2016   Procedure: KENALOG INJECTION BILATERAL NECK;  Surgeon: Peggye Form, DO;  Location:  [facility];  Service: Plastics;  Laterality: Bilateral;   LOWER EXTREMITY ANGIOGRAM  07/20/2013   Unsuccessful attempt at crossing the CTO/notes 07/20/2013   LOWER EXTREMITY ANGIOGRAM N/A 07/06/2013   Procedure: LOWER EXTREMITY ANGIOGRAM;  Surgeon: Pamella Pert, MD;  Location: [facility] CATH LAB;  Service: Cardiovascular;  Laterality: N/A;   LOWER EXTREMITY ANGIOGRAM N/A 07/20/2013   Procedure: LOWER EXTREMITY ANGIOGRAM;  Surgeon: Pamella Pert, MD;  Location: [facility] CATH LAB;  Service: Cardiovascular;  Laterality: N/A;   LOWER EXTREMITY ANGIOGRAM N/A 10/05/2013   Procedure: LOWER EXTREMITY ANGIOGRAM;  Surgeon: Pamella Pert, MD;  Location: [facility] CATH LAB;  Service: Cardiovascular;  Laterality: N/A;   MASS EXCISION Right 08/22/2016   Procedure: EXCISION RIGHT NECK KELOID;  Surgeon: Peggye Form, DO;  Location: [facility];  Service: Plastics;  Laterality: Right;   MASS EXCISION Right 09/18/2020   Procedure: Excision of right neck keloid;  Surgeon: Peggye Form, DO;  Location: [facility] OR;  Service: Plastics;  Laterality: Right;   PATCH ANGIOPLASTY Left 02/07/2014   Procedure: PATCH ANGIOPLASTY Carotid;  Surgeon: Sherren Kerns, MD;  Location: [facility] OR;  Service: Vascular;  Laterality: Left;   PATCH ANGIOPLASTY Right 04/18/2014   Procedure:  PATCH ANGIOPLASTY USING HEMASHIELD 0.8cmx 7.6cm PATCH;  Surgeon: Sherren Kerns, MD;  Location: [facility] OR;  Service: Vascular;  Laterality: Right;   PERIPHERAL VASCULAR CATHETERIZATION N/A 07/05/2014   Procedure: Lower Extremity Angiography;  Surgeon: Yates Decamp, MD;  Location: [facility] INVASIVE CV LAB;  Service: Cardiovascular;  Laterality: N/A;   TUBAL LIGATION     No Known Allergies    Objective:    Physical Exam Vitals and nursing note reviewed.  Constitutional:      Appearance: Normal appearance.  Cardiovascular:     Rate and Rhythm: Normal rate and regular rhythm.  Pulmonary:     Effort: Pulmonary effort is normal.     Breath sounds: Normal breath sounds.  Musculoskeletal:        General: Normal range of motion.  Skin:    General: Skin is warm and dry.  Neurological:     Mental Status: She is alert.  Psychiatric:        Mood and Affect: Mood normal.        Behavior: Behavior normal.    BP 113/63 (BP Location: Left Arm, Patient Position: Sitting, Cuff Size: Normal)   Pulse 66   Temp (!) 97.5 F (36.4 C) (Temporal)   Ht 4\' 11"  (1.499 m)   Wt 143 lb 4 oz (65 kg)   SpO2 97%   BMI 28.93 kg/m  Wt Readings from Last 3 Encounters:  08/13/22 143 lb 4 oz (65 kg)  08/05/22 145 lb (65.8 kg)  06/18/22 145 lb 3.2 oz (65.9 kg)       Dulce Sellar, NP

## 2022-08-13 NOTE — Patient Instructions (Signed)
It was very nice to see you today!   STOP the Buspirone (Buspar) that I gave your for your nerves.   On Friday, start the new medication, Gabapentin, but DO NOT START until Friday. You will take 1 capsule twice a day for one week, then increase to 2 pills twice a day if needed. This should help the nerve pain in your side and legs and also help with your anxiety.  We will call you and see how you are doing.  Have a great 4th of July!       PLEASE NOTE:  If you had any lab tests please let us know if you have not heard back within a few days. You may see your results on MyChart before we have a chance to review them but we will give you a call once they are reviewed by [place]. If we ordered any referrals today, please let us know if you have not heard from their office within the next week.

## 2022-08-13 NOTE — Assessment & Plan Note (Signed)
chronic taking 150mg  of Zoloft & started Buspar 5mg  bid about 6 wks ago, not tolerating, making anxiety a little worse, and her flank pain radiating to both legs advised to stop Buspar, starting Gabapentin 100mg  bid, increase to 200mg  bid after 1 week if needed need to keep eye on GFR, pt reminded to drink more water, avoid NSAIDs f/u 1 mos prn

## 2022-08-14 ENCOUNTER — Other Ambulatory Visit: Payer: Self-pay | Admitting: Family

## 2022-08-14 DIAGNOSIS — F411 Generalized anxiety disorder: Secondary | ICD-10-CM

## 2022-08-22 ENCOUNTER — Telehealth: Payer: Self-pay

## 2022-08-22 NOTE — Telephone Encounter (Signed)
-----   Message from Dulce Sellar sent at 08/13/2022  4:15 PM EDT ----- Regarding: return for labs Please call Joanne Morris and ask how she is doing on the Gabapentin. Is it helping her nerves & pain any? If not, she can increase to 2 pills twice a day. Remind her she has to drink at least 8 cups of water daily while taking this med and I need her to return in one month to check her kidney function, please schedule.

## 2022-08-22 NOTE — Telephone Encounter (Signed)
I called pt, Pt states helps Gabapentin does help for a couple of days but pain comes back. Pt gave verbalized understanding to instructions.   1 Month OV schedule for September 24, 2022.

## 2022-08-30 DIAGNOSIS — M47816 Spondylosis without myelopathy or radiculopathy, lumbar region: Secondary | ICD-10-CM | POA: Diagnosis not present

## 2022-08-30 DIAGNOSIS — M5136 Other intervertebral disc degeneration, lumbar region: Secondary | ICD-10-CM | POA: Diagnosis not present

## 2022-08-30 DIAGNOSIS — M545 Low back pain, unspecified: Secondary | ICD-10-CM | POA: Diagnosis not present

## 2022-09-02 ENCOUNTER — Telehealth: Payer: Self-pay | Admitting: Family

## 2022-09-02 NOTE — Telephone Encounter (Signed)
Access nurse unable to reach pt. Spoke with pt this morning. States went to ED on 08/30/22. Pt will call if anything changes.   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 She has an appt. on 8.19.2024 and she is calling to see if she can get an appt. sooner. Caller is having pain around her waist in her back area. She is thinking about going to the ER. Translation No Disp. Time Lamount Cohen Time) Disposition Final User 08/30/2022 12:18:16 PM Attempt made - message left Lequita Halt 08/30/2022 12:33:30 PM Send To RN Personal Luana Shu, RN, Carlee 08/30/2022 12:45:38 PM Attempt made - no message left Nevin Bloodgood, RN, Merry Proud 08/30/2022 12:54:51 PM FINAL ATTEMPT MADE - no message left Yes Nevin Bloodgood, RN, Brandi Final Disposition 08/30/2022 12:54:51 PM FINAL ATTEMPT MADE - no message left Yes Capitanio, RN, Merry Proud

## 2022-09-03 ENCOUNTER — Telehealth: Payer: Self-pay | Admitting: Family

## 2022-09-03 NOTE — Telephone Encounter (Signed)
Patient requests to be called re: Hydrocodone

## 2022-09-03 NOTE — Telephone Encounter (Signed)
LVM to schedule Office Visit

## 2022-09-03 NOTE — Telephone Encounter (Signed)
See below, this is a [facility] pt.

## 2022-09-04 NOTE — Telephone Encounter (Signed)
Patient states she will keep 09/24/22 Office Visit. Declined sooner appointment.

## 2022-09-05 ENCOUNTER — Other Ambulatory Visit: Payer: Self-pay | Admitting: Family

## 2022-09-05 NOTE — Telephone Encounter (Signed)
I Called pt and pt states Gabapentin did not help sx even after trying 2 capsules twice a day. Pt states she went to the ED on 7/20 due to pain being terrible, pt was given Oxycodone 5 mg and was given 7 tablets, which does help sx.

## 2022-09-06 ENCOUNTER — Telehealth: Payer: Self-pay

## 2022-09-06 NOTE — Telephone Encounter (Signed)
Transition Care Management Follow-up Telephone Call Date of discharge and from where: [facility] 7/19 How have you been since you were released from the hospital? Doing better  Any questions or concerns? No  Items Reviewed: Did the pt receive and understand the discharge instructions provided? Yes  Medications obtained and verified? No  Other? No  Any new allergies since your discharge? No  Dietary orders reviewed? No Do you have support at home? Yes     Follow up appointments reviewed:  PCP Hospital f/u appt confirmed? Yes  Scheduled to see PCP on 8/13 @ . Specialist Hospital f/u appt confirmed? No  Scheduled to see  on  @ . Are transportation arrangements needed? No  If their condition worsens, is the pt aware to call PCP or go to the Emergency Dept.? Yes Was the patient provided with contact information for the PCP's office or ED? Yes Was to pt encouraged to call back with questions or concerns? Yes

## 2022-09-10 ENCOUNTER — Other Ambulatory Visit: Payer: Self-pay | Admitting: Family

## 2022-09-10 DIAGNOSIS — G894 Chronic pain syndrome: Secondary | ICD-10-CM

## 2022-09-10 MED ORDER — HYDROCODONE-ACETAMINOPHEN 5-325 MG PO TABS
1.0000 | ORAL_TABLET | Freq: Every day | ORAL | 0 refills | Status: DC | PRN
Start: 2022-09-10 — End: 2022-10-01

## 2022-09-10 NOTE — Telephone Encounter (Signed)
Ok, thx -Let her know I refilled her Hydrocodone for her pain. She should stop taking the Gabapentin.

## 2022-09-11 ENCOUNTER — Encounter (INDEPENDENT_AMBULATORY_CARE_PROVIDER_SITE_OTHER): Payer: Self-pay

## 2022-09-11 NOTE — Telephone Encounter (Signed)
I called pt and pt verbalized understanding.

## 2022-09-17 ENCOUNTER — Other Ambulatory Visit: Payer: Self-pay | Admitting: Family

## 2022-09-17 DIAGNOSIS — F411 Generalized anxiety disorder: Secondary | ICD-10-CM

## 2022-09-24 ENCOUNTER — Ambulatory Visit: Payer: 59 | Admitting: Family

## 2022-09-26 ENCOUNTER — Encounter (INDEPENDENT_AMBULATORY_CARE_PROVIDER_SITE_OTHER): Payer: Self-pay

## 2022-10-01 ENCOUNTER — Ambulatory Visit (INDEPENDENT_AMBULATORY_CARE_PROVIDER_SITE_OTHER): Payer: 59 | Admitting: Family

## 2022-10-01 ENCOUNTER — Encounter: Payer: Self-pay | Admitting: Family

## 2022-10-01 VITALS — BP 144/74 | HR 70 | Temp 96.6°F | Ht 59.0 in | Wt 144.2 lb

## 2022-10-01 DIAGNOSIS — F411 Generalized anxiety disorder: Secondary | ICD-10-CM

## 2022-10-01 DIAGNOSIS — G894 Chronic pain syndrome: Secondary | ICD-10-CM

## 2022-10-01 MED ORDER — HYDROCODONE-ACETAMINOPHEN 5-325 MG PO TABS
1.0000 | ORAL_TABLET | Freq: Every day | ORAL | 0 refills | Status: DC | PRN
Start: 2022-10-09 — End: 2022-10-22

## 2022-10-01 MED ORDER — BUSPIRONE HCL 5 MG PO TABS
5.0000 mg | ORAL_TABLET | Freq: Two times a day (BID) | ORAL | 5 refills | Status: DC
Start: 2022-10-01 — End: 2023-03-26

## 2022-10-01 NOTE — Assessment & Plan Note (Signed)
Chronic, unstable Takes 1 hydrocodone 5mg  pill daily,prn reported more pain last visit, switched med to Gabapentin, titrated dose up, did not help her pain, advised to stopped, resumed Hydrocodone also anxiety a little better which she thought was making her pain worse Follow-up in 3 month

## 2022-10-01 NOTE — Assessment & Plan Note (Signed)
chronic taking 150mg  of Zoloft & started Buspar 5mg  bid, last visit said it was not working, but now states it is working no longer taking Gabapentin, did not help pain at all at 200mg  bid sending Bupar refill f/u 3 mos

## 2022-10-01 NOTE — Patient Instructions (Signed)
It was very nice to see you today!   I have sent your Hydrocodone refill to [facility] - you can pick this up on 8/29. I sent a refill of the Buspirone for your nerves to [facility] mail order pharmacy. Call the Vascular office to schedule a follow up appointment.  Schedule a 3 month follow up visit with me and we will do labs that day - ok to eat some crackers and take your medicine BEFORE you come!  Have a great week!       PLEASE NOTE:  If you had any lab tests please let us know if you have not heard back within a few days. You may see your results on MyChart before we have a chance to review them but we will give you a call once they are reviewed by [place]. If we ordered any referrals today, please let us know if you have not heard from their office within the next week.

## 2022-10-01 NOTE — Progress Notes (Signed)
Patient ID: Joanne Morris, female    DOB: 03/05/1950, 72 y.o.   MRN: [id number]  Chief Complaint  Patient presents with   Anxiety   Hypertension    HPI: Anxiety and depression:  has been controlled well with zoloft 150mg  qd, but pt reported she has had increased episodes of anxiety, where she "gets worked up" about something and it creates tension in her body and increased pain. Added Buspar back in May, pt was taking bid and stated it was not helping, but today she states it now is helping.   Pain She reports chronic right side pain. was not an injury that may have caused the pain. The pain started about a year ago and is staying constant. The pain does not radiate. The pain is described as aching and soreness, is moderate in intensity, occurring intermittently. Symptoms are worse in the: evening  Aggravating factors: standing and walking She has tried application of heat, application of ice, NSAIDs, and prescription pain relievers with moderate relief.  Taking Vicodin 5-325mg  qd w/some relief. States she feels the pain more when her anxiety is high.  Assessment & Plan:  Chronic pain syndrome Assessment & Plan: Chronic, unstable Takes 1 hydrocodone 5mg  pill daily,prn reported more pain last visit, switched med to Gabapentin, titrated dose up, did not help her pain, advised to stopped, resumed Hydrocodone also anxiety a little better which she thought was making her pain worse Follow-up in 3 month  Orders: -     HYDROcodone-Acetaminophen; Take 1 tablet by mouth daily as needed for moderate pain.  Dispense: 30 tablet; Refill: 0  Generalized anxiety disorder Assessment & Plan: chronic taking 150mg  of Zoloft & started Buspar 5mg  bid, last visit said it was not working, but now states it is working no longer taking Gabapentin, did not help pain at all at 200mg  bid sending Bupar refill f/u 3 mos   Orders: -     busPIRone HCl; Take 1 tablet (5 mg total) by mouth 2 (two) times daily.   Dispense: 60 tablet; Refill: 5   Subjective:    Outpatient Medications Prior to Visit  Medication Sig Dispense Refill   amLODipine (NORVASC) 10 MG tablet TAKE ONE TABLET BY MOUTH DAILY AT 9AM (VIAL) 90 tablet 3   aspirin EC 81 MG tablet Take 81 mg by mouth daily.     CALCIUM-VITAMIN D PO Take 1 tablet by mouth daily.     cilostazol (PLETAL) 100 MG tablet TAKE ONE TABLET BY MOUTH TWICE DAILY BEFORE A MEAL (VIAL) 180 tablet 11   dapagliflozin propanediol (FARXIGA) 5 MG TABS tablet TAKE ONE TABLET BY MOUTH DAILY AT 9AM IN THE MORNING (VIAL) 90 tablet 3   diclofenac Sodium (VOLTAREN) 1 % GEL Apply 2 g topically 4 (four) times daily. 100 g 3   glipiZIDE (GLUCOTROL XL) 5 MG 24 hr tablet Take 1 tablet (5 mg total) by mouth daily with breakfast. 90 tablet 3   hydrALAZINE (APRESOLINE) 50 MG tablet TAKE ONE TABLET BY MOUTH THREE TIMES DAILY 90 tablet 2   levocetirizine (XYZAL) 5 MG tablet Take 1 tablet (5 mg total) by mouth daily as needed for allergies. 90 tablet 3   LINZESS 145 MCG CAPS capsule TAKE ONE CAPSULE ( TOTAL) BY MOUTH DAILY 90 capsule 11   metoprolol tartrate (LOPRESSOR) 100 MG tablet TAKE ONE TABLET BY MOUTH TWICE DAILY @9am  & 5pm (vial) 180 tablet 1   niacin (VITAMIN B3) 500 MG ER tablet Take 1 tablet (500 mg total)  by mouth daily. 90 tablet 3   polyethylene glycol powder (GLYCOLAX/MIRALAX) 17 GM/SCOOP powder Take 17 g by mouth daily as needed for constipation.     pravastatin (PRAVACHOL) 40 MG tablet TAKE ONE TABLET BY MOUTH DAILY AT 5PM (VIAL) 90 tablet 3   sertraline (ZOLOFT) 100 MG tablet TAKE ONE TABLET BY MOUTH DAILY AT 9AM IN ADDITION TO 50 MG TABLET (VIAL) 90 tablet 3   sertraline (ZOLOFT) 50 MG tablet TAKE ONE TABLET BY MOUTH DAILY AT 5PM EVERY EVENING (VIAL) 90 tablet 3   zolpidem (AMBIEN CR) 12.5 MG CR tablet Take 1 tablet (12.5 mg total) by mouth at bedtime. 90 tablet 1   gabapentin (NEURONTIN) 100 MG capsule Take 1-2 capsules (100-200 mg total) by mouth 2 (two) times  daily. AFTER 1 week, you can increase to 2 pills bid if needed for nerve pain or anxiety. 60 capsule 1   HYDROcodone-acetaminophen (NORCO/VICODIN) 5-325 MG tablet Take 1 tablet by mouth daily as needed for moderate pain. 30 tablet 0   No facility-administered medications prior to visit.   Past Medical History:  Diagnosis Date   Abdominal pain 10/11/2012   Anemia 05/31/2011   Angina    Anxiety    Arthritis    "knees" (07/20/2013)   Atypical chest pain 05/31/2011   Carotid stenosis, asymptomatic 04/18/2014   Claudication (HCC)    COVID-19 virus infection 08/01/2018   Depression    GERD (gastroesophageal reflux disease)    Hematoma of neck 04/23/2014   High cholesterol    Hyperlipemia 05/31/2011   Hypokalemia 05/31/2011   Leucocytosis 05/31/2011   Myocardial infarction [facility]) 1990's   "1"   Neck pain on right side 10/04/2014   PAD (peripheral artery disease) (HCC)    Peripheral arterial disease (HCC) 06/08/2013   Peripheral arterial disease   Radiculopathy of leg 10/11/2012   Stroke [facility])    "mini stroke 1st then regular stroke", denies residual on 07/20/2013   Wears glasses    Past Surgical History:  Procedure Laterality Date   ABDOMINAL HYSTERECTOMY     ATHERECTOMY  10/05/2013   BALLOON ANGIOPLASTY, ARTERY  10/05/2013   DR Jacinto Halim   CAROTID ENDARTERECTOMY     CORONARY ANGIOPLASTY WITH STENT PLACEMENT     "1"   ENDARTERECTOMY Left 02/07/2014   Procedure: ENDARTERECTOMY CAROTID;  Surgeon: Sherren Kerns, MD;  Location: [facility] OR;  Service: Vascular;  Laterality: Left;   ENDARTERECTOMY Right 04/18/2014   Procedure: ENDARTERECTOMY RIGHT CAROTID;  Surgeon: Sherren Kerns, MD;  Location: [facility] OR;  Service: Vascular;  Laterality: Right;   ENDARTERECTOMY N/A 04/24/2014   Procedure: IRRIGATION AND DEBRIDEMENT OF RIGHT NECK ;  Surgeon: Sherren Kerns, MD;  Location: [facility] OR;  Service: Vascular;  Laterality: N/A;   ESOPHAGOGASTRODUODENOSCOPY N/A 10/13/2012   Procedure: ESOPHAGOGASTRODUODENOSCOPY (EGD);   Surgeon: Iva Boop, MD;  Location: [facility] ENDOSCOPY;  Service: Endoscopy;  Laterality: N/A;   KENALOG INJECTION Bilateral 08/22/2016   Procedure: KENALOG INJECTION BILATERAL NECK;  Surgeon: Peggye Form, DO;  Location: [place] [facility];  Service: Plastics;  Laterality: Bilateral;   LOWER EXTREMITY ANGIOGRAM  07/20/2013   Unsuccessful attempt at crossing the CTO/notes 07/20/2013   LOWER EXTREMITY ANGIOGRAM N/A 07/06/2013   Procedure: LOWER EXTREMITY ANGIOGRAM;  Surgeon: Pamella Pert, MD;  Location[facility] CATH LAB;  Service: Cardiovascular;  Laterality: N/A;   LOWER EXTREMITY ANGIOGRAM N/A 07/20/2013   Procedure: LOWER EXTREMITY ANGIOGRAM;  Surgeon: Pamella Pert, MD;  Location: [facility] CATH LAB;  Service: Cardiovascular;  Laterality: N/A;   LOWER EXTREMITY ANGIOGRAM N/A 10/05/2013   Procedure: LOWER EXTREMITY ANGIOGRAM;  Surgeon: Pamella Pert, MD;  Location: [facility] CATH LAB;  Service: Cardiovascular;  Laterality: N/A;   MASS EXCISION Right 08/22/2016   Procedure: EXCISION RIGHT NECK KELOID;  Surgeon: Peggye Form, DO;  Location:  SURGERY CENTER;  Service: Plastics;  Laterality: Right;   MASS EXCISION Right 09/18/2020   Procedure: Excision of right neck keloid;  Surgeon: Peggye Form, DO;  [facility]: [facility] OR;  Service: Plastics;  Laterality: Right;   PATCH ANGIOPLASTY Left 02/07/2014   Procedure: PATCH ANGIOPLASTY Carotid;  Surgeon: Sherren Kerns, MD;  Location: [facility] OR;  Service: Vascular;  Laterality: Left;   PATCH ANGIOPLASTY Right 04/18/2014   Procedure: PATCH ANGIOPLASTY USING HEMASHIELD 0.8cmx 7.6cm PATCH;  Surgeon: Sherren Kerns, MD;  Location: [facility] OR;  Service: Vascular;  Laterality: Right;   PERIPHERAL VASCULAR CATHETERIZATION N/A 07/05/2014   Procedure: Lower Extremity Angiography;  Surgeon: Yates Decamp, MD;  Location: [facility] INVASIVE CV LAB;  Service: Cardiovascular;  Laterality: N/A;   TUBAL LIGATION     No Known Allergies    Objective:    Physical  Exam Vitals and nursing note reviewed.  Constitutional:      Appearance: Normal appearance.  Cardiovascular:     Rate and Rhythm: Normal rate and regular rhythm.  Pulmonary:     Effort: Pulmonary effort is normal.     Breath sounds: Normal breath sounds.  Musculoskeletal:        General: Normal range of motion.  Skin:    General: Skin is warm and dry.  Neurological:     Mental Status: She is alert.  Psychiatric:        Mood and Affect: Mood normal.        Behavior: Behavior normal.    BP (!) 144/74   Pulse 70   Temp (!) 96.6 F (35.9 C) (Temporal)   Ht 4\' 11"  (1.499 m)   Wt 144 lb 3.2 oz (65.4 kg)   SpO2 97%   BMI 29.12 kg/m  Wt Readings from Last 3 Encounters:  10/01/22 144 lb 3.2 oz (65.4 kg)  08/13/22 143 lb 4 oz (65 kg)  08/05/22 145 lb (65.8 kg)      Dulce Sellar, NP

## 2022-10-09 ENCOUNTER — Telehealth: Payer: Self-pay | Admitting: Family

## 2022-10-09 NOTE — Telephone Encounter (Signed)
RX sent on 8/20 to fill on 8/28 to [facility]

## 2022-10-09 NOTE — Telephone Encounter (Signed)
Prescription Request  10/09/2022  LOV: 10/01/2022  What is the name of the medication or equipment?  HYDROcodone-acetaminophen (NORCO/VICODIN) 5-325 MG tablet   Have you contacted your pharmacy to request a refill? No   Which pharmacy would you like this sent to? [id number] Rosalita Levan, [place] - [address] AT [facility] [address] [place] [address] Phone: [phone] Fax: [phone]   Patient notified that their request is being sent to the clinical staff for review and that they should receive a response within 2 business days.   Please advise at Mobile [phone] (mobile)

## 2022-10-18 ENCOUNTER — Encounter: Payer: Self-pay | Admitting: Pharmacist

## 2022-10-21 ENCOUNTER — Other Ambulatory Visit: Payer: Self-pay | Admitting: Family

## 2022-10-21 DIAGNOSIS — E119 Type 2 diabetes mellitus without complications: Secondary | ICD-10-CM

## 2022-10-22 ENCOUNTER — Ambulatory Visit (INDEPENDENT_AMBULATORY_CARE_PROVIDER_SITE_OTHER): Payer: 59 | Admitting: Family

## 2022-10-22 VITALS — BP 121/72 | HR 68 | Temp 97.5°F | Ht 59.0 in | Wt 142.4 lb

## 2022-10-22 DIAGNOSIS — K5903 Drug induced constipation: Secondary | ICD-10-CM | POA: Diagnosis not present

## 2022-10-22 DIAGNOSIS — F5101 Primary insomnia: Secondary | ICD-10-CM | POA: Diagnosis not present

## 2022-10-22 DIAGNOSIS — N183 Chronic kidney disease, stage 3 unspecified: Secondary | ICD-10-CM

## 2022-10-22 DIAGNOSIS — G894 Chronic pain syndrome: Secondary | ICD-10-CM

## 2022-10-22 DIAGNOSIS — Z23 Encounter for immunization: Secondary | ICD-10-CM | POA: Diagnosis not present

## 2022-10-22 DIAGNOSIS — I739 Peripheral vascular disease, unspecified: Secondary | ICD-10-CM | POA: Diagnosis not present

## 2022-10-22 DIAGNOSIS — E785 Hyperlipidemia, unspecified: Secondary | ICD-10-CM | POA: Diagnosis not present

## 2022-10-22 DIAGNOSIS — I152 Hypertension secondary to endocrine disorders: Secondary | ICD-10-CM

## 2022-10-22 DIAGNOSIS — G4700 Insomnia, unspecified: Secondary | ICD-10-CM | POA: Diagnosis not present

## 2022-10-22 DIAGNOSIS — N1831 Chronic kidney disease, stage 3a: Secondary | ICD-10-CM | POA: Diagnosis not present

## 2022-10-22 DIAGNOSIS — R197 Diarrhea, unspecified: Secondary | ICD-10-CM | POA: Diagnosis not present

## 2022-10-22 DIAGNOSIS — F411 Generalized anxiety disorder: Secondary | ICD-10-CM

## 2022-10-22 DIAGNOSIS — E1122 Type 2 diabetes mellitus with diabetic chronic kidney disease: Secondary | ICD-10-CM

## 2022-10-22 LAB — COMPREHENSIVE METABOLIC PANEL
ALT: 16 U/L (ref 0–35)
AST: 22 U/L (ref 0–37)
Albumin: 4.4 g/dL (ref 3.5–5.2)
Alkaline Phosphatase: 83 U/L (ref 39–117)
BUN: 20 mg/dL (ref 6–23)
CO2: 28 meq/L (ref 19–32)
Calcium: 10.4 mg/dL (ref 8.4–10.5)
Chloride: 104 meq/L (ref 96–112)
Creatinine, Ser: 1.27 mg/dL — ABNORMAL HIGH (ref 0.40–1.20)
GFR: 42.39 mL/min — ABNORMAL LOW (ref >60.00)
Glucose, Bld: 137 mg/dL — ABNORMAL HIGH (ref 70–99)
Potassium: 4.1 meq/L (ref 3.5–5.1)
Sodium: 140 meq/L (ref 135–145)
Total Bilirubin: 0.3 mg/dL (ref 0.2–1.2)
Total Protein: 8 g/dL (ref 6.0–8.3)

## 2022-10-22 LAB — LIPID PANEL
Cholesterol: 162 mg/dL (ref 0–200)
HDL: 60 mg/dL (ref >39.00)
LDL Cholesterol: 66 mg/dL (ref 0–99)
NonHDL: 102.31
Total CHOL/HDL Ratio: 3
Triglycerides: 184 mg/dL — ABNORMAL HIGH (ref 0.0–149.0)
VLDL: 36.8 mg/dL (ref 0.0–40.0)

## 2022-10-22 LAB — CBC WITH DIFFERENTIAL/PLATELET
Basophils Absolute: 0 10*3/uL (ref 0.0–0.1)
Basophils Relative: 0.2 % (ref 0.0–3.0)
Eosinophils Absolute: 0.2 10*3/uL (ref 0.0–0.7)
Eosinophils Relative: 1.7 % (ref 0.0–5.0)
HCT: 40.3 % (ref 36.0–46.0)
Hemoglobin: 12.3 g/dL (ref 12.0–15.0)
Lymphocytes Relative: 34.9 % (ref 12.0–46.0)
Lymphs Abs: 3.2 10*3/uL (ref 0.7–4.0)
MCHC: 30.7 g/dL (ref 30.0–36.0)
MCV: 78 fl (ref 78.0–100.0)
Monocytes Absolute: 0.5 10*3/uL (ref 0.1–1.0)
Monocytes Relative: 6 % (ref 3.0–12.0)
Neutro Abs: 5.3 10*3/uL (ref 1.4–7.7)
Neutrophils Relative %: 57.2 % (ref 43.0–77.0)
Platelets: 340 10*3/uL (ref 150.0–400.0)
RBC: 5.16 Mil/uL — ABNORMAL HIGH (ref 3.87–5.11)
RDW: 16.5 % — ABNORMAL HIGH (ref 11.5–15.5)
WBC: 9.2 10*3/uL (ref 4.0–10.5)

## 2022-10-22 LAB — HEMOGLOBIN A1C: Hgb A1c MFr Bld: 6.6 % — ABNORMAL HIGH (ref 4.6–6.5)

## 2022-10-22 LAB — MICROALBUMIN / CREATININE URINE RATIO
Creatinine,U: 76.2 mg/dL
Microalb Creat Ratio: 0.9 mg/g (ref 0.0–30.0)
Microalb, Ur: <0.7 mg/dL (ref 0.0–1.9)

## 2022-10-22 MED ORDER — HYDROCODONE-ACETAMINOPHEN 5-325 MG PO TABS
1.0000 | ORAL_TABLET | Freq: Every day | ORAL | 0 refills | Status: DC | PRN
Start: 2022-11-12 — End: 2022-12-27

## 2022-10-22 MED ORDER — CILOSTAZOL 100 MG PO TABS
100.0000 mg | ORAL_TABLET | Freq: Two times a day (BID) | ORAL | 3 refills | Status: DC
Start: 2022-10-22 — End: 2023-10-06

## 2022-10-22 MED ORDER — GLIPIZIDE ER 5 MG PO TB24
5.0000 mg | ORAL_TABLET | Freq: Every day | ORAL | 3 refills | Status: DC
Start: 2022-10-22 — End: 2023-10-06

## 2022-10-22 MED ORDER — DAPAGLIFLOZIN PROPANEDIOL 5 MG PO TABS
ORAL_TABLET | ORAL | 3 refills | Status: DC
Start: 2022-10-22 — End: 2022-10-28

## 2022-10-22 MED ORDER — LINZESS 145 MCG PO CAPS
145.0000 ug | ORAL_CAPSULE | Freq: Every day | ORAL | Status: DC
Start: 2022-10-22 — End: 2023-07-17

## 2022-10-22 MED ORDER — HYDRALAZINE HCL 50 MG PO TABS
50.0000 mg | ORAL_TABLET | Freq: Three times a day (TID) | ORAL | 3 refills | Status: DC
Start: 2022-10-22 — End: 2023-10-06

## 2022-10-22 MED ORDER — ZOLPIDEM TARTRATE ER 12.5 MG PO TBCR
12.5000 mg | EXTENDED_RELEASE_TABLET | Freq: Every day | ORAL | 5 refills | Status: DC
Start: 2022-11-12 — End: 2023-01-14

## 2022-10-22 NOTE — Assessment & Plan Note (Signed)
chronic, stable Pravastatin 40mg  qd, Niacin 500mg  ER qd recheck labs today no refills needed today f/u 1 yr

## 2022-10-22 NOTE — Assessment & Plan Note (Addendum)
Chronic, stable pt has claudication, taking Pletal 100mg  bid continues to c/o pain toda reminded pt again to go ahead and get her Vascular appt moved up and let them know she's having more pain Phone # provided today sending refill f/u 1 yr

## 2022-10-22 NOTE — Assessment & Plan Note (Signed)
chronic stable on Amlodipine 10mg  & Metoprolol 100mg  bid, follows with Cardiology q74m no refills needed today, checking labs f/u 6mos

## 2022-10-22 NOTE — Assessment & Plan Note (Signed)
Chronic, stable Takes 1 hydrocodone 5mg  pill daily sending refill Follow-up in 3 month

## 2022-10-22 NOTE — Assessment & Plan Note (Signed)
chronic last GFR 42.25 12/2021 continue to advised pt on drinking enough water, avoid NSAIDs, take BP meds daily taking Scherrie Gerlach 5mg  every day checking labs today sending refill f/u 62m-50yr

## 2022-10-22 NOTE — Progress Notes (Signed)
Patient ID: Joanne Morris, female    DOB: [birth year], 72 y.o.   MRN: [id number]  Chief Complaint  Patient presents with   Leg Pain    Pt c/o right leg and right foot pain, present for months. Has tried hydrocodone which does help for a couple of hours.    Diarrhea    Pt c/o diarrhea after she eats any meal. Present for months off and on. Has not tried any OTC.     HPI: Hypertension: Patient is currently maintained on the following medications for blood pressure: Valsartan, Hydralyzine, Metoprolol, Amlodipine Failed meds include: none Patient reports good compliance with blood pressure medications. Patient denies chest pain, headaches, shortness of breath or swelling.  Hyperlipidemia: Patient is currently maintained on the following medication for hyperlipidemia: Pravachol. Patient denies myalgia or other side effects. Patient reports good compliance with low fat/low cholesterol diet.  Last lipid panel as follows: Lab Results  Component Value Date   CHOL 180 05/01/2021   HDL 62.00 05/01/2021   LDLCALC 90 05/01/2021   TRIG 142.0 05/01/2021   CHOLHDL 3 05/01/2021   PAD: DX in 2015, stents placed in right lower leg, has taken Pletal bid since. pt c/o on & off pain in lower leg & foot since then when walking, reports worsening pain over last month or so. Denies burning, numbness or tingling. Followed by Vascular & vein office in [place].  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 Last GFR 46. Denies hypoglycemia. Home glucose readings range: does not check at home Last A1C was  Lab Results  Component Value Date   HGBA1C 6.2 (A) 05/01/2021   HGBA1C 5.8 (H) 08/01/2018   HGBA1C 6.5 (H) 10/11/2012   Pain:  She reports chronic right side pain. was not an injury that may have caused the pain. The pain started about a year ago and is staying constant. The  pain does not radiate. The pain is described as aching and soreness, is moderate in intensity, occurring intermittently. Symptoms are worse in the: evening  Aggravating factors: standing and walking She has tried application of heat, application of ice, NSAIDs, and prescription pain relievers with moderate relief.  Taking Vicodin 5-325mg  qd w/some relief. States she feels the pain more when her anxiety is high.  INSOMNIA:  How long: years. Difficulty initiating sleep: yes.  Difficulty maintaining sleep: yes.  OTC meds tried: Benadryl, Melatonin. RX meds in past: Trazodone.  Sleep hygiene measures: yes. Started any new meds recently: no. Shift worker: no. New stressors: no. Currently taking Zolpidem qhs, working well, denies any SE.   Assessment & Plan:  Generalized anxiety disorder Assessment & Plan: chronic taking 150mg  of Zoloft & started Buspar 5mg  bid since adding the Buspar, pt reports having diarrhea every time she eats advised to hold the Buspar for one week to see if diarrhea improves, if not, will send medication no refills needed today f/u 3 mos    Type 2 diabetes mellitus with stage 3 chronic kidney disease, without long-term current use of insulin, unspecified whether stage 3a or 3b CKD (HCC) Assessment & Plan: Chronic, stable last A1C 6.2 stable - pt taking meds daily, not checking CBGs at home rechecking A1C, BMP refilling Glipizide 5mg  qd f/u in 7m    Orders: -     Microalbumin / creatinine urine ratio -     Hemoglobin A1c -  Comprehensive metabolic panel -     glipiZIDE ER; Take 1 tablet (5 mg total) by mouth daily with breakfast.  Dispense: 90 tablet; Refill: 3  Dyslipidemia Assessment & Plan: chronic, stable Pravastatin 40mg  qd, Niacin 500mg  ER qd recheck labs today no refills needed today f/u 1 yr  Orders: -     Lipid panel  Hypertension due to endocrine disorder Assessment & Plan: chronic stable on Amlodipine 10mg  & Metoprolol 100mg  bid, follows  with Cardiology q50m no refills needed today, checking labs f/u 6mos  Orders: -     Comprehensive metabolic panel -     CBC with Differential/Platelet -     hydrALAZINE HCl; Take 1 tablet (50 mg total) by mouth 3 (three) times daily.  Dispense: 270 tablet; Refill: 3  Primary insomnia Assessment & Plan: Chronic Stable on Ambien 12.5mg  ER [facility] charges her over $100, but local pharmacy [facility] only charges her $50 sending refill Follow-up in 3 months    Chronic pain syndrome Assessment & Plan: Chronic, stable Takes 1 hydrocodone 5mg  pill daily sending refill Follow-up in 3 month  Orders: -     HYDROcodone-Acetaminophen; Take 1 tablet by mouth daily as needed for moderate pain.  Dispense: 30 tablet; Refill: 0  PAD (peripheral artery disease) (HCC) Assessment & Plan: Chronic, stable pt has claudication, taking Pletal 100mg  bid continues to c/o pain toda reminded pt again to go ahead and get her Vascular appt moved up and let them know she's having more pain Phone # provided today sending refill f/u 1 yr  Orders: -     CBC with Differential/Platelet -     Cilostazol; Take 1 tablet (100 mg total) by mouth 2 (two) times daily.  Dispense: 180 tablet; Refill: 3  Drug-induced constipation Assessment & Plan: chronic, stable r/t hydrocodone & other meds pt has been taking Linzess qd and tolerating, but reports not taking d/t recent off & on diarrhea for last few months sending refill, advised to continue to hold until diarrhea resolved sending refill  f/u 6 mos  Orders: -     Linzess; Take 1 capsule (145 mcg total) by mouth daily before breakfast.  Stage 3a chronic kidney disease (HCC) Assessment & Plan: chronic last GFR 42.25 12/2021 continue to advised pt on drinking enough water, avoid NSAIDs, take BP meds daily taking [place] 5mg  every day checking labs today sending refill f/u 48m-58yr    Orders: -     Comprehensive metabolic panel -     Dapagliflozin  Propanediol; TAKE ONE TABLET BY MOUTH DAILY AT 9AM IN THE MORNING (VIAL)  Dispense: 90 tablet; Refill: 3  Insomnia, unspecified type Assessment & Plan: Chronic Stable on Ambien 12.5mg  ER [facility] charges her over $100, but local pharmacy [facility] only charges her $50 sending refill Follow-up in 3 months   Orders: -     Zolpidem Tartrate ER; Take 1 tablet (12.5 mg total) by mouth at bedtime.  Dispense: 30 tablet; Refill: 5  Immunization due -     Flu Vaccine QUAD High Dose(Fluad)  Diarrhea, unspecified type -     Comprehensive metabolic panel   Subjective:    Outpatient Medications Prior to Visit  Medication Sig Dispense Refill   amLODipine (NORVASC) 10 MG tablet TAKE ONE TABLET BY MOUTH DAILY AT 9AM (VIAL) 90 tablet 3   aspirin EC 81 MG tablet Take 81 mg by mouth daily.     busPIRone (BUSPAR) 5 MG tablet Take 1 tablet (5 mg total) by mouth 2 (  two) times daily. 60 tablet 5   CALCIUM-VITAMIN D PO Take 1 tablet by mouth daily.     diclofenac Sodium (VOLTAREN) 1 % GEL Apply 2 g topically 4 (four) times daily. 100 g 3   levocetirizine (XYZAL) 5 MG tablet Take 1 tablet (5 mg total) by mouth daily as needed for allergies. 90 tablet 3   metoprolol tartrate (LOPRESSOR) 100 MG tablet TAKE ONE TABLET BY MOUTH TWICE DAILY @9am  & 5pm (vial) 180 tablet 1   niacin (VITAMIN B3) 500 MG ER tablet Take 1 tablet (500 mg total) by mouth daily. 90 tablet 3   polyethylene glycol powder (GLYCOLAX/MIRALAX) 17 GM/SCOOP powder Take 17 g by mouth daily as needed for constipation.     pravastatin (PRAVACHOL) 40 MG tablet TAKE ONE TABLET BY MOUTH DAILY AT 5PM (VIAL) 90 tablet 3   sertraline (ZOLOFT) 100 MG tablet TAKE ONE TABLET BY MOUTH DAILY AT 9AM IN ADDITION TO 50 MG TABLET (VIAL) 90 tablet 3   sertraline (ZOLOFT) 50 MG tablet TAKE ONE TABLET BY MOUTH DAILY AT 5PM EVERY EVENING (VIAL) 90 tablet 3   cilostazol (PLETAL) 100 MG tablet TAKE ONE TABLET BY MOUTH TWICE DAILY BEFORE A MEAL (VIAL) 180 tablet 11    dapagliflozin propanediol (FARXIGA) 5 MG TABS tablet TAKE ONE TABLET BY MOUTH DAILY AT 9AM IN THE MORNING (VIAL) 90 tablet 3   glipiZIDE (GLUCOTROL XL) 5 MG 24 hr tablet Take 1 tablet (5 mg total) by mouth daily with breakfast. 90 tablet 3   hydrALAZINE (APRESOLINE) 50 MG tablet TAKE ONE TABLET BY MOUTH THREE TIMES DAILY 90 tablet 2   HYDROcodone-acetaminophen (NORCO/VICODIN) 5-325 MG tablet Take 1 tablet by mouth daily as needed for moderate pain. 30 tablet 0   LINZESS 145 MCG CAPS capsule TAKE ONE CAPSULE ( TOTAL) BY MOUTH DAILY 90 capsule 11   zolpidem (AMBIEN CR) 12.5 MG CR tablet Take 1 tablet (12.5 mg total) by mouth at bedtime. 90 tablet 1   No facility-administered medications prior to visit.   Past Medical History:  Diagnosis Date   Abdominal pain 10/11/2012   Anemia 05/31/2011   Angina    Anxiety    Arthritis    "knees" (07/20/2013)   Atypical chest pain 05/31/2011   Carotid stenosis, asymptomatic 04/18/2014   Claudication (HCC)    COVID-19 virus infection 08/01/2018   Depression    GERD (gastroesophageal reflux disease)    Hematoma of neck 04/23/2014   High cholesterol    Hyperlipemia 05/31/2011   Hypokalemia 05/31/2011   Leucocytosis 05/31/2011   Myocardial infarction [facility]) 1990's   "1"   Neck pain on right side 10/04/2014   PAD (peripheral artery disease) (HCC)    Peripheral arterial disease (HCC) 06/08/2013   Peripheral arterial disease   Radiculopathy of leg 10/11/2012   Stroke [facility])    "mini stroke 1st then regular stroke", denies residual on 07/20/2013   Wears glasses    Past Surgical History:  Procedure Laterality Date   ABDOMINAL HYSTERECTOMY     ATHERECTOMY  10/05/2013   BALLOON ANGIOPLASTY, ARTERY  10/05/2013   DR Jacinto Halim   CAROTID ENDARTERECTOMY     CORONARY ANGIOPLASTY WITH STENT PLACEMENT     "1"   ENDARTERECTOMY Left 02/07/2014   Procedure: ENDARTERECTOMY CAROTID;  Surgeon: Sherren Kerns, MD;  Location: [facility] OR;  Service: Vascular;  Laterality: Left;    ENDARTERECTOMY Right 04/18/2014   Procedure: ENDARTERECTOMY RIGHT CAROTID;  Surgeon: Sherren Kerns, MD;  Location: [facility] OR;  Service: Vascular;  Laterality: Right;   ENDARTERECTOMY N/A 04/24/2014   Procedure: IRRIGATION AND DEBRIDEMENT OF RIGHT NECK ;  Surgeon: Sherren Kerns, MD;  Location: [facility] OR;  Service: Vascular;  Laterality: N/A;   ESOPHAGOGASTRODUODENOSCOPY N/A 10/13/2012   Procedure: ESOPHAGOGASTRODUODENOSCOPY (EGD);  Surgeon: Iva Boop, MD;  Location: [facility] ENDOSCOPY;  Service: Endoscopy;  Laterality: N/A;   KENALOG INJECTION Bilateral 08/22/2016   Procedure: KENALOG INJECTION BILATERAL NECK;  Surgeon: Peggye Form, DO;  Location: [facility];  Service: Plastics;  Laterality: Bilateral;   LOWER EXTREMITY ANGIOGRAM  07/20/2013   Unsuccessful attempt at crossing the CTO/notes 07/20/2013   LOWER EXTREMITY ANGIOGRAM N/A 07/06/2013   Procedure: LOWER EXTREMITY ANGIOGRAM;  Surgeon: Pamella Pert, MD;  Location: [facility] CATH LAB;  Service: Cardiovascular;  Laterality: N/A;   LOWER EXTREMITY ANGIOGRAM N/A 07/20/2013   Procedure: LOWER EXTREMITY ANGIOGRAM;  Surgeon: Pamella Pert, MD;  Location: [facility] CATH LAB;  Service: Cardiovascular;  Laterality: N/A;   LOWER EXTREMITY ANGIOGRAM N/A 10/05/2013   Procedure: LOWER EXTREMITY ANGIOGRAM;  Surgeon: Pamella Pert, MD;  Location: [facility] CATH LAB;  Service: Cardiovascular;  Laterality: N/A;   MASS EXCISION Right 08/22/2016   Procedure: EXCISION RIGHT NECK KELOID;  Surgeon: Peggye Form, DO;  Location: [facility];  Service: Plastics;  Laterality: Right;   MASS EXCISION Right 09/18/2020   Procedure: Excision of right neck keloid;  Surgeon: Peggye Form, DO;  Location: [facility] OR;  Service: Plastics;  Laterality: Right;   PATCH ANGIOPLASTY Left 02/07/2014   Procedure: PATCH ANGIOPLASTY Carotid;  Surgeon: Sherren Kerns, MD;  Location: [facility] OR;  Service: Vascular;  Laterality: Left;   PATCH ANGIOPLASTY Right 04/18/2014    Procedure: PATCH ANGIOPLASTY USING HEMASHIELD 0.8cmx 7.6cm PATCH;  Surgeon: Sherren Kerns, MD;  Location: [facility] OR;  Service: Vascular;  Laterality: Right;   PERIPHERAL VASCULAR CATHETERIZATION N/A 07/05/2014   Procedure: Lower Extremity Angiography;  Surgeon: Yates Decamp, MD;  Location: [facility] INVASIVE CV LAB;  Service: Cardiovascular;  Laterality: N/A;   TUBAL LIGATION     No Known Allergies    Objective:    Physical Exam Vitals and nursing note reviewed.  Constitutional:      Appearance: Normal appearance.  Cardiovascular:     Rate and Rhythm: Normal rate and regular rhythm.  Pulmonary:     Effort: Pulmonary effort is normal.     Breath sounds: Normal breath sounds.  Musculoskeletal:        General: Normal range of motion.  Skin:    General: Skin is warm and dry.  Neurological:     Mental Status: She is alert.  Psychiatric:        Mood and Affect: Mood normal.        Behavior: Behavior normal.    BP 121/72 (BP Location: Left Arm, Patient Position: Sitting, Cuff Size: Normal)   Pulse 68   Temp (!) 97.5 F (36.4 C) (Temporal)   Ht 4\' 11"  (1.499 m)   Wt 142 lb 6 oz (64.6 kg)   SpO2 97%   BMI 28.76 kg/m  Wt Readings from Last 3 Encounters:  10/22/22 142 lb 6 oz (64.6 kg)  10/01/22 144 lb 3.2 oz (65.4 kg)  08/13/22 143 lb 4 oz (65 kg)       Dulce Sellar, NP

## 2022-10-22 NOTE — Patient Instructions (Addendum)
It was very nice to see you today!   We will call you about yourr lab results in a few days.  Call your Vascular/Vein provider about your leg pain - [phone] Stop the Buspirone (Buspar) the pill for anxiety.  See if this helps stop your diarrhea.  We will call you in one week. If still having diarrhea, then I will send medicine to help stop it. I have sent refills for all of your meds that needed filling. Remember to drink at least 8 cups of water every day, starting in the morning.  Schedule a 4 month follow up visit.    PLEASE NOTE:  If you had any lab tests please let us know if you have not heard back within a few days. You may see your results on MyChart before we have a chance to review them but we will give you a call once they are reviewed by [place]. If we ordered any referrals today, please let us know if you have not heard from their office within the next week.

## 2022-10-22 NOTE — Assessment & Plan Note (Signed)
chronic taking 150mg  of Zoloft & started Buspar 5mg  bid since adding the Buspar, pt reports having diarrhea every time she eats advised to hold the Buspar for one week to see if diarrhea improves, if not, will send medication no refills needed today f/u 3 mos

## 2022-10-22 NOTE — Assessment & Plan Note (Signed)
Chronic, stable last A1C 6.2 stable - pt taking meds daily, not checking CBGs at home rechecking A1C, BMP refilling Glipizide 5mg  qd f/u in 40m

## 2022-10-22 NOTE — Assessment & Plan Note (Signed)
chronic, stable r/t hydrocodone & other meds pt has been taking Linzess qd and tolerating, but reports not taking d/t recent off & on diarrhea for last few months sending refill, advised to continue to hold until diarrhea resolved sending refill  f/u 6 mos

## 2022-10-22 NOTE — Assessment & Plan Note (Signed)
Chronic Stable on Ambien 12.5mg  ER [facility] charges her over $100, but local pharmacy [facility] only charges her $50 sending refill Follow-up in 3 months

## 2022-10-28 ENCOUNTER — Telehealth: Payer: Self-pay | Admitting: Family

## 2022-10-28 ENCOUNTER — Encounter: Payer: Self-pay | Admitting: Family

## 2022-10-28 DIAGNOSIS — N1831 Chronic kidney disease, stage 3a: Secondary | ICD-10-CM

## 2022-10-28 DIAGNOSIS — G894 Chronic pain syndrome: Secondary | ICD-10-CM

## 2022-10-28 MED ORDER — ACETAMINOPHEN ER 650 MG PO TBCR
650.0000 mg | EXTENDED_RELEASE_TABLET | Freq: Three times a day (TID) | ORAL | 5 refills | Status: DC | PRN
Start: 2022-10-28 — End: 2023-05-28

## 2022-10-28 MED ORDER — DAPAGLIFLOZIN PROPANEDIOL 10 MG PO TABS
10.0000 mg | ORAL_TABLET | Freq: Every morning | ORAL | 3 refills | Status: DC
Start: 2022-10-28 — End: 2023-10-06

## 2022-10-28 NOTE — Telephone Encounter (Signed)
Patient called requesting call back in regards to lab results. Patient is also wanting PCP to call her in something for her arthritis. Pt last OV was 10/22/22.

## 2022-10-28 NOTE — Telephone Encounter (Signed)
answered in her result note, thx

## 2022-10-28 NOTE — Progress Notes (Signed)
A1C is up to 6.6 from 6.2 a year ago. Has to reduce her carbohydrate intake, less sweets.  Kidney function about the same, has to remember to drink 8 cups of water daily. other labs ok, keep taking same meds daily.  For her arthritis, I will send arthritis strength tylenol. Due to her poor kidney function she can't take NSAIDs. And she can take the Hydrocodone daily for pain. Any questions let me know. thx

## 2022-11-04 ENCOUNTER — Telehealth: Payer: Self-pay | Admitting: Family

## 2022-11-04 NOTE — Telephone Encounter (Signed)
Pt states she was told if she had issues with meds to call our office. She states she is having diarrhea every time she eats and think its because of her meds.

## 2022-11-04 NOTE — Telephone Encounter (Signed)
Patient stated that she was told to give the office a call if she was still having diarrhea and pcp would send medication to the pharmacy. [facility] in [place].

## 2022-11-06 ENCOUNTER — Telehealth: Payer: Self-pay

## 2022-11-06 DIAGNOSIS — R197 Diarrhea, unspecified: Secondary | ICD-10-CM

## 2022-11-06 NOTE — Telephone Encounter (Signed)
Pt called in regards to Diarrhea. Pt states she was told to stop Buspar for a couple of days to see if this helps but she is still having diarrhea. Diarrhea is still the same not worse, Pt would like RX sent into [facility] in [place].   Please advise.

## 2022-11-07 DIAGNOSIS — R197 Diarrhea, unspecified: Secondary | ICD-10-CM | POA: Insufficient documentation

## 2022-11-07 MED ORDER — DIPHENOXYLATE-ATROPINE 2.5-0.025 MG PO TABS
1.0000 | ORAL_TABLET | Freq: Four times a day (QID) | ORAL | 0 refills | Status: DC | PRN
Start: 2022-11-07 — End: 2023-01-01

## 2022-11-07 NOTE — Addendum Note (Signed)
Addended byDulce Sellar on: 11/07/2022 12:25 PM   Modules accepted: Orders

## 2022-11-07 NOTE — Telephone Encounter (Signed)
I sent in medication to take AS NEEDED, she can restart the Buspar

## 2022-11-08 NOTE — Telephone Encounter (Signed)
I called pt in regards, pt verbalized understanding.

## 2022-11-11 ENCOUNTER — Other Ambulatory Visit: Payer: Self-pay | Admitting: Family

## 2022-11-14 ENCOUNTER — Other Ambulatory Visit: Payer: Self-pay

## 2022-11-14 DIAGNOSIS — Z1231 Encounter for screening mammogram for malignant neoplasm of breast: Secondary | ICD-10-CM

## 2022-11-15 ENCOUNTER — Other Ambulatory Visit: Payer: Self-pay | Admitting: Family

## 2022-11-15 DIAGNOSIS — Z1211 Encounter for screening for malignant neoplasm of colon: Secondary | ICD-10-CM

## 2022-11-15 DIAGNOSIS — Z1212 Encounter for screening for malignant neoplasm of rectum: Secondary | ICD-10-CM

## 2022-11-18 DIAGNOSIS — Z1231 Encounter for screening mammogram for malignant neoplasm of breast: Secondary | ICD-10-CM | POA: Diagnosis not present

## 2022-11-18 LAB — HM MAMMOGRAPHY

## 2022-11-21 ENCOUNTER — Other Ambulatory Visit: Payer: Self-pay | Admitting: *Deleted

## 2022-11-21 DIAGNOSIS — I739 Peripheral vascular disease, unspecified: Secondary | ICD-10-CM

## 2022-11-25 ENCOUNTER — Telehealth: Payer: Self-pay | Admitting: Family

## 2022-11-25 ENCOUNTER — Other Ambulatory Visit: Payer: Self-pay

## 2022-11-25 DIAGNOSIS — E785 Hyperlipidemia, unspecified: Secondary | ICD-10-CM

## 2022-11-25 DIAGNOSIS — I10 Essential (primary) hypertension: Secondary | ICD-10-CM

## 2022-11-25 DIAGNOSIS — J309 Allergic rhinitis, unspecified: Secondary | ICD-10-CM

## 2022-11-25 DIAGNOSIS — F3341 Major depressive disorder, recurrent, in partial remission: Secondary | ICD-10-CM

## 2022-11-25 MED ORDER — NIACIN ER (ANTIHYPERLIPIDEMIC) 500 MG PO TBCR: 500.0000 mg | EXTENDED_RELEASE_TABLET | Freq: Every day | ORAL | 3 refills | Status: DC

## 2022-11-25 MED ORDER — AMLODIPINE BESYLATE 10 MG PO TABS: ORAL_TABLET | ORAL | 3 refills | Status: DC

## 2022-11-25 MED ORDER — PRAVASTATIN SODIUM 40 MG PO TABS: ORAL_TABLET | ORAL | 3 refills | Status: DC

## 2022-11-25 MED ORDER — SERTRALINE HCL 100 MG PO TABS: ORAL_TABLET | ORAL | 3 refills | Status: DC

## 2022-11-25 MED ORDER — LEVOCETIRIZINE DIHYDROCHLORIDE 5 MG PO TABS: 5.0000 mg | ORAL_TABLET | Freq: Every day | ORAL | 3 refills | Status: DC | PRN

## 2022-11-25 NOTE — Telephone Encounter (Signed)
Prescription Request  11/25/2022  LOV: 10/22/2022  What is the name of the medication or equipment? 1. valsartan (DIOVAN) 320 MG tablet  2. amLODipine (NORVASC) 10 MG tablet  3. pravastatin (PRAVACHOL) 40 MG tablet  4. niacin (VITAMIN B3) 500 MG ER tablet  5. levocetirizine (XYZAL) 5 MG tablet  6. sertraline (ZOLOFT) 100 MG tablet   Have you contacted your pharmacy to request a refill? Yes   Which pharmacy would you like this sent to?   [facility] [place] [address] [place] [address] Phone: [phone] Fax: [phone]    Patient notified that their request is being sent to the clinical staff for review and that they should receive a response within 2 business days.   Please advise at  [phone]

## 2022-12-03 ENCOUNTER — Ambulatory Visit (HOSPITAL_COMMUNITY)
Admission: RE | Admit: 2022-12-03 | Discharge: 2022-12-03 | Disposition: A | Discharge status: Home / Self Care | Payer: 59 | Source: Ambulatory Visit | Attending: Vascular Surgery | Admitting: Vascular Surgery

## 2022-12-03 ENCOUNTER — Other Ambulatory Visit (HOSPITAL_COMMUNITY): Payer: Self-pay | Admitting: Vascular Surgery

## 2022-12-03 ENCOUNTER — Ambulatory Visit (INDEPENDENT_AMBULATORY_CARE_PROVIDER_SITE_OTHER)
Admission: RE | Admit: 2022-12-03 | Discharge: 2022-12-03 | Disposition: A | Discharge status: Home / Self Care | Payer: 59 | Source: Ambulatory Visit | Attending: Vascular Surgery

## 2022-12-03 DIAGNOSIS — I6529 Occlusion and stenosis of unspecified carotid artery: Secondary | ICD-10-CM | POA: Diagnosis not present

## 2022-12-03 DIAGNOSIS — I739 Peripheral vascular disease, unspecified: Secondary | ICD-10-CM | POA: Insufficient documentation

## 2022-12-03 LAB — VAS US ABI WITH/WO TBI
Left ABI: 0.79
Right ABI: 0.73

## 2022-12-04 ENCOUNTER — Other Ambulatory Visit: Payer: Self-pay | Admitting: Family

## 2022-12-04 DIAGNOSIS — G894 Chronic pain syndrome: Secondary | ICD-10-CM

## 2022-12-09 NOTE — Progress Notes (Deleted)
Patient name: Joanne Morris MRN: [id number] DOB: 10/21/1950 Sex: female  REASON FOR VISIT: Follow-up for surveillance carotid artery disease and PAD  HPI: Joanne Morris is a 72 y.o. female with history of carotid stenosis status post bilateral carotid endarterectomies by Dr. Darrick Penna, hypertension, hyperlipidemia, coronary artery disease status post remote MI, diabetes, PAD that presents for surveillance of PAD and carotid artery disease.    Reports no strokes or TIAs since last follow-up.  She is most bothered by burning in the right calf after walking 15 to 20 minutes.  No rest pain or wounds.   She has previously been under care of Dr. Jacinto Halim.  She has previously undergone a right peroneal atherectomy 2015.  She has single-vessel runoff in the peroneal artery on the right per his note as well as single vessel peroneal runoff on the left.    Past Medical History:  Diagnosis Date   Abdominal pain 10/11/2012   Anemia 05/31/2011   Angina    Anxiety    Arthritis    "knees" (07/20/2013)   Atypical chest pain 05/31/2011   Carotid stenosis, asymptomatic 04/18/2014   Claudication (HCC)    COVID-19 virus infection 08/01/2018   Depression    GERD (gastroesophageal reflux disease)    Hematoma of neck 04/23/2014   High cholesterol    Hyperlipemia 05/31/2011   Hypokalemia 05/31/2011   Leucocytosis 05/31/2011   Myocardial infarction [facility]) 1990's   "1"   Neck pain on right side 10/04/2014   PAD (peripheral artery disease) (HCC)    Peripheral arterial disease (HCC) 06/08/2013   Peripheral arterial disease   Radiculopathy of leg 10/11/2012   Stroke [facility])    "mini stroke 1st then regular stroke", denies residual on 07/20/2013   Wears glasses     Past Surgical History:  Procedure Laterality Date   ABDOMINAL HYSTERECTOMY     ATHERECTOMY  10/05/2013   BALLOON ANGIOPLASTY, ARTERY  10/05/2013   DR Jacinto Halim   CAROTID ENDARTERECTOMY     CORONARY ANGIOPLASTY WITH STENT PLACEMENT     "1"   ENDARTERECTOMY Left  02/07/2014   Procedure: ENDARTERECTOMY CAROTID;  Surgeon: Sherren Kerns, MD;  Location: [facility] OR;  Service: Vascular;  Laterality: Left;   ENDARTERECTOMY Right 04/18/2014   Procedure: ENDARTERECTOMY RIGHT CAROTID;  Surgeon: Sherren Kerns, MD;  Location: [facility] OR;  Service: Vascular;  Laterality: Right;   ENDARTERECTOMY N/A 04/24/2014   Procedure: IRRIGATION AND DEBRIDEMENT OF RIGHT NECK ;  Surgeon: Sherren Kerns, MD;  Location: [facility] OR;  Service: Vascular;  Laterality: N/A;   ESOPHAGOGASTRODUODENOSCOPY N/A 10/13/2012   Procedure: ESOPHAGOGASTRODUODENOSCOPY (EGD);  Surgeon: Iva Boop, MD;  Location: [facility] ENDOSCOPY;  Service: Endoscopy;  Laterality: N/A;   KENALOG INJECTION Bilateral 08/22/2016   Procedure: KENALOG INJECTION BILATERAL NECK;  Surgeon: Peggye Form, DO;  Location: [facility];  Service: Plastics;  Laterality: Bilateral;   LOWER EXTREMITY ANGIOGRAM  07/20/2013   Unsuccessful attempt at crossing the CTO/notes 07/20/2013   LOWER EXTREMITY ANGIOGRAM N/A 07/06/2013   Procedure: LOWER EXTREMITY ANGIOGRAM;  Surgeon: Pamella Pert, MD;  Location: [facility] CATH LAB;  Service: Cardiovascular;  Laterality: N/A;   LOWER EXTREMITY ANGIOGRAM N/A 07/20/2013   Procedure: LOWER EXTREMITY ANGIOGRAM;  Surgeon: Pamella Pert, MD;  Location: [facility] CATH LAB;  Service: Cardiovascular;  Laterality: N/A;   LOWER EXTREMITY ANGIOGRAM N/A 10/05/2013   Procedure: LOWER EXTREMITY ANGIOGRAM;  Surgeon: Pamella Pert, MD;  Location: [facility] CATH LAB;  Service: Cardiovascular;  Laterality: N/A;  MASS EXCISION Right 08/22/2016   Procedure: EXCISION RIGHT NECK KELOID;  Surgeon: Peggye Form, DO;  Location: [facility];  Service: Plastics;  Laterality: Right;   MASS EXCISION Right 09/18/2020   Procedure: Excision of right neck keloid;  Surgeon: Peggye Form, DO;  Location: [facility] OR;  Service: Plastics;  Laterality: Right;   PATCH ANGIOPLASTY Left 02/07/2014   Procedure: PATCH  ANGIOPLASTY Carotid;  Surgeon: Sherren Kerns, MD;  Location: [facility] OR;  Service: Vascular;  Laterality: Left;   PATCH ANGIOPLASTY Right 04/18/2014   Procedure: PATCH ANGIOPLASTY USING HEMASHIELD 0.8cmx 7.6cm PATCH;  Surgeon: Sherren Kerns, MD;  Location: [facility] OR;  Service: Vascular;  Laterality: Right;   PERIPHERAL VASCULAR CATHETERIZATION N/A 07/05/2014   Procedure: Lower Extremity Angiography;  Surgeon: Yates Decamp, MD;  Location: [facility] INVASIVE CV LAB;  Service: Cardiovascular;  Laterality: N/A;   TUBAL LIGATION      No family history on file.  SOCIAL HISTORY: Social History   Tobacco Use   Smoking status: Former    Current packs/day: 0.00    Average packs/day: 0.5 packs/day for 4.0 years (2.0 ttl pk-yrs)    Types: Cigarettes    Start date: 05/30/2005    Quit date: 05/30/2009    Years since quitting: 13.5   Smokeless tobacco: Never  Substance Use Topics   Alcohol use: No    Alcohol/week: 0.0 standard drinks of alcohol    No Known Allergies   Current Outpatient Medications  Medication Sig Dispense Refill   acetaminophen (TYLENOL) 650 MG CR tablet Take 1 tablet (650 mg total) by mouth every 8 (eight) hours as needed for pain. 90 tablet 5   amLODipine (NORVASC) 10 MG tablet TAKE ONE TABLET BY MOUTH DAILY AT 9AM (VIAL) 90 tablet 3   aspirin EC 81 MG tablet Take 81 mg by mouth daily.     busPIRone (BUSPAR) 5 MG tablet Take 1 tablet (5 mg total) by mouth 2 (two) times daily. 60 tablet 5   CALCIUM-VITAMIN D PO Take 1 tablet by mouth daily.     cilostazol (PLETAL) 100 MG tablet Take 1 tablet (100 mg total) by mouth 2 (two) times daily. 180 tablet 3   dapagliflozin propanediol (FARXIGA) 10 MG TABS tablet Take 1 tablet (10 mg total) by mouth in the morning. 90 tablet 3   diclofenac Sodium (VOLTAREN) 1 % GEL APPLY 2 GRAMS TOPICALLY FOUR TIMES DAILY 100 g 3   diphenoxylate-atropine (LOMOTIL) 2.5-0.025 MG tablet Take 1 tablet by mouth 4 (four) times daily as needed for diarrhea or loose stools  (Max dose of 8 tabs in 24 hours). 30 tablet 0   glipiZIDE (GLUCOTROL XL) 5 MG 24 hr tablet Take 1 tablet (5 mg total) by mouth daily with breakfast. 90 tablet 3   hydrALAZINE (APRESOLINE) 50 MG tablet Take 1 tablet (50 mg total) by mouth 3 (three) times daily. 270 tablet 3   HYDROcodone-acetaminophen (NORCO/VICODIN) 5-325 MG tablet Take 1 tablet by mouth daily as needed for moderate pain. 30 tablet 0   levocetirizine (XYZAL) 5 MG tablet Take 1 tablet (5 mg total) by mouth daily as needed for allergies. 90 tablet 3   LINZESS 145 MCG CAPS capsule Take 1 capsule (145 mcg total) by mouth daily before breakfast.     metoprolol tartrate (LOPRESSOR) 100 MG tablet TAKE ONE TABLET BY MOUTH TWICE DAILY @9am  & 5pm (vial) 180 tablet 1   niacin (VITAMIN B3) 500 MG ER tablet Take 1 tablet (500 mg  total) by mouth daily. 90 tablet 3   polyethylene glycol powder (GLYCOLAX/MIRALAX) 17 GM/SCOOP powder Take 17 g by mouth daily as needed for constipation.     pravastatin (PRAVACHOL) 40 MG tablet TAKE ONE TABLET BY MOUTH DAILY AT 5PM (VIAL) 90 tablet 3   sertraline (ZOLOFT) 100 MG tablet TAKE ONE TABLET BY MOUTH DAILY AT 9AM IN ADDITION TO 50 MG TABLET (VIAL) 90 tablet 3   sertraline (ZOLOFT) 50 MG tablet TAKE ONE TABLET BY MOUTH DAILY AT 5PM EVERY EVENING (VIAL) 90 tablet 3   valsartan (DIOVAN) 320 MG tablet TAKE ONE TABLET BY MOUTH DAILY AT 9AM (VIAL) 100 tablet 2   zolpidem (AMBIEN CR) 12.5 MG CR tablet Take 1 tablet (12.5 mg total) by mouth at bedtime. 30 tablet 5   No current facility-administered medications for this visit.    REVIEW OF SYSTEMS:  [X]  denotes positive finding, [ ]  denotes negative finding Cardiac  Comments:  Chest pain or chest pressure:    Shortness of breath upon exertion:    Short of breath when lying flat:    Irregular heart rhythm:        Vascular    Pain in calf, thigh, or hip brought on by ambulation: x Both legs  Pain in feet at night that wakes you up from your sleep:     Blood  clot in your veins:    Leg swelling:         Pulmonary    Oxygen at home:    Productive cough:     Wheezing:         Neurologic    Sudden weakness in arms or legs:     Sudden numbness in arms or legs:     Sudden onset of difficulty speaking or slurred speech:    Temporary loss of vision in one eye:     Problems with dizziness:         Gastrointestinal    Blood in stool:     Vomited blood:         Genitourinary    Burning when urinating:     Blood in urine:        Psychiatric    Major depression:         Hematologic    Bleeding problems:    Problems with blood clotting too easily:        Skin    Rashes or ulcers:        Constitutional    Fever or chills:      PHYSICAL EXAM: There were no vitals filed for this visit.   GENERAL: The patient is a well-nourished female, in no acute distress. The vital signs are documented above. CARDIAC: There is a regular rate and rhythm.  VASCULAR:  Palpable femoral pulses bilaterally No palpable pedal pulses No lower extremity tissue loss PULMONARY: No respiratory distress. ABDOMEN: Soft and non-tender. MUSCULOSKELETAL: There are no major deformities or cyanosis. NEUROLOGIC: No focal weakness or paresthesias are detected. SKIN: There are no ulcers or rashes noted. PSYCHIATRIC: The patient has a normal affect.  DATA:   Carotid duplex today shows no stenosis in either ICA.  ABIs today are 0.73 on the right and 0.79 on the left  Assessment/Plan:  72 year old female that has been under the care of Dr. Darrick Penna and has undergone bilateral carotid endarterectomies that presents for surveillance of her PAD and carotid artery disease.  Discussed no significant stenosis on carotid duplex today.  Her main complaint is  burning in the right calf after walking 15 to 20 minutes consistent with claudication.  I discussed if she is walking 15 to 20 minutes without stopping that is pretty good and I would not recommend intervention until this  becomes lifestyle limiting.  I discussed aspirin statin which she is taking as well as walking therapies.    Recommend follow-up in 1 year with repeat carotid duplex and ABIs.  Cephus Shelling, MD [facility] Office: [phone]

## 2022-12-10 ENCOUNTER — Ambulatory Visit: Payer: 59 | Admitting: Vascular Surgery

## 2022-12-26 ENCOUNTER — Telehealth: Payer: Self-pay | Admitting: Family

## 2022-12-26 DIAGNOSIS — G894 Chronic pain syndrome: Secondary | ICD-10-CM

## 2022-12-26 NOTE — Telephone Encounter (Signed)
Prescription Request  12/26/2022  LOV: 10/22/2022  What is the name of the medication or equipment?  HYDROcodone-acetaminophen (NORCO/VICODIN) 5-325 MG tablet   Have you contacted your pharmacy to request a refill? No   Which pharmacy would you like this sent to?  [id number] Rosalita Levan, [place] - [address] AT [facility] [address] [place] [address] Phone: [phone] Fax: [phone]   Patient notified that their request is being sent to the clinical staff for review and that they should receive a response within 2 business days.   Please advise at Mobile [phone] (mobile)

## 2022-12-27 MED ORDER — HYDROCODONE-ACETAMINOPHEN 5-325 MG PO TABS: 1.0000 | ORAL_TABLET | Freq: Every day | ORAL | 0 refills | Status: DC | PRN

## 2022-12-27 NOTE — Telephone Encounter (Signed)
RX sent

## 2023-01-01 ENCOUNTER — Encounter: Payer: Self-pay | Admitting: Family

## 2023-01-01 ENCOUNTER — Ambulatory Visit (INDEPENDENT_AMBULATORY_CARE_PROVIDER_SITE_OTHER): Payer: 59 | Admitting: Family

## 2023-01-01 VITALS — BP 113/70 | HR 66 | Temp 97.5°F | Ht 59.0 in | Wt 142.5 lb

## 2023-01-01 DIAGNOSIS — G8929 Other chronic pain: Secondary | ICD-10-CM

## 2023-01-01 DIAGNOSIS — R197 Diarrhea, unspecified: Secondary | ICD-10-CM

## 2023-01-01 DIAGNOSIS — M25561 Pain in right knee: Secondary | ICD-10-CM | POA: Diagnosis not present

## 2023-01-01 MED ORDER — DIPHENOXYLATE-ATROPINE 2.5-0.025 MG PO TABS: 1.0000 | ORAL_TABLET | Freq: Four times a day (QID) | ORAL | 1 refills | Status: DC | PRN

## 2023-01-01 NOTE — Progress Notes (Signed)
Patient ID: Joanne Morris, female    DOB: September 02, 1950, 72 y.o.   MRN: [id number]  Chief Complaint  Patient presents with   Diarrhea    Pt states she did not try lomotil sent in.    Leg Pain    Pt c/o right knee pain, Present for about a month.    Discussed the use of AI scribe software for clinical note transcription with the patient, who gave verbal consent to proceed.  History of Present Illness   The patient presents with persistent knee pain, localized to the right knee. The pain is described as a constant ache, exacerbated by walking, denies swelling. The patient has been using a hot pad for relief and has been taking hydrocodone, which provides some relief. The patient has not had any imaging or orthopedic evaluation for this issue.  In addition to the knee pain, the patient reports ongoing diarrhea. She describes it as occurring every time she eats, with food seemingly "going straight through" her. The patient was prescribed Lomotil for this issue, but it appears she did not pick up the medication.     Assessment & Plan:     Knee Pain - Likely osteoarthritis given the patient's age and symptoms. No prior imaging or orthopedic consultation. Hydrocodone provides some relief. -Refer to Orthopedics for further evaluation and potential steroid injections. -Continue using topical analgesic gel as needed.  Chronic Diarrhea - Occurs after every meal. Patient has not picked up prescribed Lomotil. -Resend Lomotil prescription to [facility]. Instruct patient to take as directed, up to four times a day, after loose stools. -Advise patient to discontinue Linzess and Miralax, which were previously prescribed for constipation.  -Reminder to drink up to 2L water qd to prevent dehydration.    Subjective:    Outpatient Medications Prior to Visit  Medication Sig Dispense Refill   acetaminophen (TYLENOL) 650 MG CR tablet Take 1 tablet (650 mg total) by mouth every 8 (eight) hours as needed for  pain. 90 tablet 5   amLODipine (NORVASC) 10 MG tablet TAKE ONE TABLET BY MOUTH DAILY AT 9AM (VIAL) 90 tablet 3   aspirin EC 81 MG tablet Take 81 mg by mouth daily.     busPIRone (BUSPAR) 5 MG tablet Take 1 tablet (5 mg total) by mouth 2 (two) times daily. 60 tablet 5   CALCIUM-VITAMIN D PO Take 1 tablet by mouth daily.     cilostazol (PLETAL) 100 MG tablet Take 1 tablet (100 mg total) by mouth 2 (two) times daily. 180 tablet 3   dapagliflozin propanediol (FARXIGA) 10 MG TABS tablet Take 1 tablet (10 mg total) by mouth in the morning. 90 tablet 3   diclofenac Sodium (VOLTAREN) 1 % GEL APPLY 2 GRAMS TOPICALLY FOUR TIMES DAILY 100 g 3   glipiZIDE (GLUCOTROL XL) 5 MG 24 hr tablet Take 1 tablet (5 mg total) by mouth daily with breakfast. 90 tablet 3   hydrALAZINE (APRESOLINE) 50 MG tablet Take 1 tablet (50 mg total) by mouth 3 (three) times daily. 270 tablet 3   HYDROcodone-acetaminophen (NORCO/VICODIN) 5-325 MG tablet Take 1 tablet by mouth daily as needed for moderate pain (pain score 4-6). 30 tablet 0   levocetirizine (XYZAL) 5 MG tablet Take 1 tablet (5 mg total) by mouth daily as needed for allergies. 90 tablet 3   LINZESS 145 MCG CAPS capsule Take 1 capsule (145 mcg total) by mouth daily before breakfast.     metoprolol tartrate (LOPRESSOR) 100 MG tablet TAKE ONE TABLET  BY MOUTH TWICE DAILY @9am  & 5pm (vial) 180 tablet 1   niacin (VITAMIN B3) 500 MG ER tablet Take 1 tablet (500 mg total) by mouth daily. 90 tablet 3   polyethylene glycol powder (GLYCOLAX/MIRALAX) 17 GM/SCOOP powder Take 17 g by mouth daily as needed for constipation.     pravastatin (PRAVACHOL) 40 MG tablet TAKE ONE TABLET BY MOUTH DAILY AT 5PM (VIAL) 90 tablet 3   sertraline (ZOLOFT) 100 MG tablet TAKE ONE TABLET BY MOUTH DAILY AT 9AM IN ADDITION TO 50 MG TABLET (VIAL) 90 tablet 3   sertraline (ZOLOFT) 50 MG tablet TAKE ONE TABLET BY MOUTH DAILY AT 5PM EVERY EVENING (VIAL) 90 tablet 3   valsartan (DIOVAN) 320 MG tablet TAKE ONE  TABLET BY MOUTH DAILY AT 9AM (VIAL) 100 tablet 2   zolpidem (AMBIEN CR) 12.5 MG CR tablet Take 1 tablet (12.5 mg total) by mouth at bedtime. 30 tablet 5   diphenoxylate-atropine (LOMOTIL) 2.5-0.025 MG tablet Take 1 tablet by mouth 4 (four) times daily as needed for diarrhea or loose stools (Max dose of 8 tabs in 24 hours). 30 tablet 0   No facility-administered medications prior to visit.   Past Medical History:  Diagnosis Date   Abdominal pain 10/11/2012   Anemia 05/31/2011   Angina    Anxiety    Arthritis    "knees" (07/20/2013)   Atypical chest pain 05/31/2011   Carotid stenosis, asymptomatic 04/18/2014   Claudication (HCC)    COVID-19 virus infection 08/01/2018   Depression    GERD (gastroesophageal reflux disease)    Hematoma of neck 04/23/2014   High cholesterol    Hyperlipemia 05/31/2011   Hypokalemia 05/31/2011   Leucocytosis 05/31/2011   Myocardial infarction [facility]) 1990's   "1"   Neck pain on right side 10/04/2014   PAD (peripheral artery disease) (HCC)    Peripheral arterial disease (HCC) 06/08/2013   Peripheral arterial disease   Radiculopathy of leg 10/11/2012   Stroke [facility])    "mini stroke 1st then regular stroke", denies residual on 07/20/2013   Wears glasses    Past Surgical History:  Procedure Laterality Date   ABDOMINAL HYSTERECTOMY     ATHERECTOMY  10/05/2013   BALLOON ANGIOPLASTY, ARTERY  10/05/2013   DR Jacinto Halim   CAROTID ENDARTERECTOMY     CORONARY ANGIOPLASTY WITH STENT PLACEMENT     "1"   ENDARTERECTOMY Left 02/07/2014   Procedure: ENDARTERECTOMY CAROTID;  Surgeon: Sherren Kerns, MD;  Location: [facility] OR;  Service: Vascular;  Laterality: Left;   ENDARTERECTOMY Right 04/18/2014   Procedure: ENDARTERECTOMY RIGHT CAROTID;  Surgeon: Sherren Kerns, MD;  Location: [facility] OR;  Service: Vascular;  Laterality: Right;   ENDARTERECTOMY N/A 04/24/2014   Procedure: IRRIGATION AND DEBRIDEMENT OF RIGHT NECK ;  Surgeon: Sherren Kerns, MD;  Location: [facility] OR;  Service: Vascular;   Laterality: N/A;   ESOPHAGOGASTRODUODENOSCOPY N/A 10/13/2012   Procedure: ESOPHAGOGASTRODUODENOSCOPY (EGD);  Surgeon: Iva Boop, MD;  Location: [facility] ENDOSCOPY;  Service: Endoscopy;  Laterality: N/A;   KENALOG INJECTION Bilateral 08/22/2016   Procedure: KENALOG INJECTION BILATERAL NECK;  Surgeon: Peggye Form, DO;  Location: [facility];  Service: Plastics;  Laterality: Bilateral;   LOWER EXTREMITY ANGIOGRAM  07/20/2013   Unsuccessful attempt at crossing the CTO/notes 07/20/2013   LOWER EXTREMITY ANGIOGRAM N/A 07/06/2013   Procedure: LOWER EXTREMITY ANGIOGRAM;  Surgeon: Pamella Pert, MD;  Location: [facility] CATH LAB;  Service: Cardiovascular;  Laterality: N/A;   LOWER EXTREMITY ANGIOGRAM N/A  07/20/2013   Procedure: LOWER EXTREMITY ANGIOGRAM;  Surgeon: Pamella Pert, MD;  Location: [facility] CATH LAB;  Service: Cardiovascular;  Laterality: N/A;   LOWER EXTREMITY ANGIOGRAM N/A 10/05/2013   Procedure: LOWER EXTREMITY ANGIOGRAM;  Surgeon: Pamella Pert, MD;  Location: [facility] CATH LAB;  Service: Cardiovascular;  Laterality: N/A;   MASS EXCISION Right 08/22/2016   Procedure: EXCISION RIGHT NECK KELOID;  Surgeon: Peggye Form, DO;  Location: [facility];  Service: Plastics;  Laterality: Right;   MASS EXCISION Right 09/18/2020   Procedure: Excision of right neck keloid;  Surgeon: Peggye Form, DO;  Location: [facility] OR;  Service: Plastics;  Laterality: Right;   PATCH ANGIOPLASTY Left 02/07/2014   Procedure: PATCH ANGIOPLASTY Carotid;  Surgeon: Sherren Kerns, MD;  Location: [facility] OR;  Service: Vascular;  Laterality: Left;   PATCH ANGIOPLASTY Right 04/18/2014   Procedure: PATCH ANGIOPLASTY USING HEMASHIELD 0.8cmx 7.6cm PATCH;  Surgeon: Sherren Kerns, MD;  Location: [facility] OR;  Service: Vascular;  Laterality: Right;   PERIPHERAL VASCULAR CATHETERIZATION N/A 07/05/2014   Procedure: Lower Extremity Angiography;  Surgeon: Yates Decamp, MD;  Location: [facility] INVASIVE CV LAB;  Service:  Cardiovascular;  Laterality: N/A;   TUBAL LIGATION     No Known Allergies    Objective:    Physical Exam Vitals and nursing note reviewed.  Constitutional:      Appearance: Normal appearance.  Cardiovascular:     Rate and Rhythm: Normal rate and regular rhythm.  Pulmonary:     Effort: Pulmonary effort is normal.     Breath sounds: Normal breath sounds.  Musculoskeletal:        General: Normal range of motion.     Right knee: Bony tenderness present. Normal range of motion. Tenderness present over the medial joint line, lateral joint line and patellar tendon.  Skin:    General: Skin is warm and dry.  Neurological:     Mental Status: She is alert.  Psychiatric:        Mood and Affect: Mood normal.        Behavior: Behavior normal.    BP 113/70 (BP Location: Left Arm, Patient Position: Sitting, Cuff Size: Large)   Pulse 66   Temp (!) 97.5 F (36.4 C) (Temporal)   Ht 4\' 11"  (1.499 m)   Wt 142 lb 8 oz (64.6 kg)   SpO2 96%   BMI 28.78 kg/m  Wt Readings from Last 3 Encounters:  01/01/23 142 lb 8 oz (64.6 kg)  10/22/22 142 lb 6 oz (64.6 kg)  10/01/22 144 lb 3.2 oz (65.4 kg)      Dulce Sellar, NP

## 2023-01-07 ENCOUNTER — Telehealth: Payer: Self-pay

## 2023-01-07 ENCOUNTER — Ambulatory Visit: Payer: 59 | Admitting: Family

## 2023-01-07 NOTE — Telephone Encounter (Signed)
Called pt in regards to [facility] (Colonoscopy) referral placed and [facility] attempted to contact patient to schedule. Patient agreeable to me helping schedule appt for her. Contacted [facility] and scheduled appt for first available 02/26/22 @ 10:30am. Patient has been advised

## 2023-01-08 ENCOUNTER — Encounter: Payer: Self-pay | Admitting: Physician Assistant

## 2023-01-11 ENCOUNTER — Other Ambulatory Visit: Payer: Self-pay | Admitting: Family

## 2023-01-11 DIAGNOSIS — F3341 Major depressive disorder, recurrent, in partial remission: Secondary | ICD-10-CM

## 2023-01-12 ENCOUNTER — Other Ambulatory Visit: Payer: Self-pay | Admitting: Family

## 2023-01-12 DIAGNOSIS — G4700 Insomnia, unspecified: Secondary | ICD-10-CM

## 2023-01-13 DIAGNOSIS — M25561 Pain in right knee: Secondary | ICD-10-CM | POA: Diagnosis not present

## 2023-01-14 ENCOUNTER — Ambulatory Visit: Payer: 59 | Admitting: Family

## 2023-01-14 MED ORDER — ZOLPIDEM TARTRATE ER 12.5 MG PO TBCR: 12.5000 mg | EXTENDED_RELEASE_TABLET | Freq: Every day | ORAL | 5 refills | Status: DC

## 2023-01-14 NOTE — Telephone Encounter (Signed)
Patient states RX for  zolpidem (AMBIEN CR) 12.5 MG CR tablet was sent to wrong Pharmacy -states [facility] no longer carries the medication.  Requests RX be sent to:   [facility] [address], [place] - [address] Phone: [phone]  Fax: [phone]

## 2023-01-21 ENCOUNTER — Ambulatory Visit: Payer: 59 | Admitting: Vascular Surgery

## 2023-02-07 ENCOUNTER — Other Ambulatory Visit: Payer: Self-pay | Admitting: Family

## 2023-02-07 DIAGNOSIS — G4700 Insomnia, unspecified: Secondary | ICD-10-CM

## 2023-02-12 DIAGNOSIS — C801 Malignant (primary) neoplasm, unspecified: Secondary | ICD-10-CM

## 2023-02-12 HISTORY — DX: Malignant (primary) neoplasm, unspecified: C80.1

## 2023-02-14 ENCOUNTER — Other Ambulatory Visit: Payer: Self-pay | Admitting: Family

## 2023-02-14 DIAGNOSIS — I10 Essential (primary) hypertension: Secondary | ICD-10-CM

## 2023-02-20 ENCOUNTER — Other Ambulatory Visit: Payer: Self-pay | Admitting: Family

## 2023-02-20 DIAGNOSIS — G894 Chronic pain syndrome: Secondary | ICD-10-CM

## 2023-02-20 NOTE — Telephone Encounter (Signed)
 Copied from CRM [id number]. Topic: Clinical - Medication Refill >> Feb 20, 2023  9:51 AM Joanne Morris wrote: Most Recent Primary Care Visit:  Provider: LUCIUS KRABBE  Department: [facility]  Visit Type: OFFICE VISIT  Date: 01/01/2023  Medication: HYDROcodone -acetaminophen  (NORCO/VICODIN) 5-325 MG tablet   Has the patient contacted their pharmacy? Yes (Agent: If no, request that the patient contact the pharmacy for the refill. If patient does not wish to contact the pharmacy document the reason why and proceed with request.) (Agent: If yes, when and what did the pharmacy advise?)  Is this the correct pharmacy for this prescription? Yes If no, delete pharmacy and type the correct one.  This is the patient's preferred pharmacy:  [id number] [place], [place] - [address] AT [facility] [address] [place] [address] Phone: [phone] Fax: [phone]     Has the prescription been filled recently? Yes  Is the patient out of the medication? Yes  Has the patient been seen for an appointment in the last year OR does the patient have an upcoming appointment? Yes  Can we respond through MyChart? Yes  Agent: Please be advised that Rx refills may take up to 3 business days. We ask that you follow-up with your pharmacy.

## 2023-02-21 MED ORDER — HYDROCODONE-ACETAMINOPHEN 5-325 MG PO TABS: 1.0000 | ORAL_TABLET | Freq: Every day | ORAL | 0 refills | Status: DC | PRN

## 2023-02-25 ENCOUNTER — Ambulatory Visit: Payer: 59 | Admitting: Family

## 2023-02-26 NOTE — Progress Notes (Deleted)
02/26/2023 Joanne Morris [id number] 13-Dec-1950  Referring provider: Dulce Sellar, NP Primary GI doctor: {acdocs:27040}  ASSESSMENT AND PLAN:   Assessment and Plan              Patient Care Team: Dulce Sellar, NP as PCP - General (Family Medicine) Elissa Hefty, NP as Nurse Practitioner Cephus Shelling, MD as Consulting Physician (Vascular Surgery) Tyrone Nine, MD as Attending Physician (Family Medicine)  HISTORY OF PRESENT ILLNESS: 73 y.o. female with a past medical history of CAD s/p stent placement, fatty liver, PAD status post carotid enterectomy on Pletal blood thinner, hypertension aortic valve sclerosis, type 2 diabetes, drug-induced constipation on Linzess and MiraLAX, stage IIIa CKD and others listed below presents for evaluation of diarrhea and colonoscopy.   Patient is only been seen in the hospital last time 2014, previously seen by Dr. Loreta Ave and Dr. Elnoria Howard with cross coverage, had follow-up visit but this was canceled.  Discussed the use of AI scribe software for clinical note transcription with the patient, who gave verbal consent to proceed.  History of Present Illness             She  reports that she quit smoking about 13 years ago. Her smoking use included cigarettes. She started smoking about 17 years ago. She has a 2 pack-year smoking history. She has never used smokeless tobacco. She reports that she does not drink alcohol and does not use drugs.  RELEVANT GI HISTORY, LABS, IMAGING:  10/13/2012 EGD Normal EGD   CTAB pelvis with contrast 09/2022 1.  Progressive hepatic steatosis since the prior CT study.  2.  No acute findings in the abdomen or pelvis.   CBC    Component Value Date/Time   WBC 9.2 10/22/2022 1004   RBC 5.16 (H) 10/22/2022 1004   HGB 12.3 10/22/2022 1004   HCT 40.3 10/22/2022 1004   PLT 340.0 10/22/2022 1004   MCV 78.0 10/22/2022 1004   MCH 24.6 (L) 09/14/2020 1602   MCHC 30.7 10/22/2022 1004   RDW  16.5 (H) 10/22/2022 1004   LYMPHSABS 3.2 10/22/2022 1004   MONOABS 0.5 10/22/2022 1004   EOSABS 0.2 10/22/2022 1004   BASOSABS 0.0 10/22/2022 1004   Recent Labs    10/22/22 1004  HGB 12.3    CMP     Component Value Date/Time   NA 140 10/22/2022 1004   NA 141 12/29/2020 0000   K 4.1 10/22/2022 1004   CL 104 10/22/2022 1004   CO2 28 10/22/2022 1004   GLUCOSE 137 (H) 10/22/2022 1004   BUN 20 10/22/2022 1004   BUN 22 (A) 12/29/2020 0000   CREATININE 1.27 (H) 10/22/2022 1004   CALCIUM 10.4 10/22/2022 1004   PROT 8.0 10/22/2022 1004   ALBUMIN 4.4 10/22/2022 1004   AST 22 10/22/2022 1004   ALT 16 10/22/2022 1004   ALKPHOS 83 10/22/2022 1004   BILITOT 0.3 10/22/2022 1004   GFRNONAA 46 (L) 09/14/2020 1602   GFRAA 45 12/29/2020 0000      Latest Ref Rng & Units 10/22/2022   10:04 AM 12/18/2021    9:43 AM 05/01/2021    9:56 AM  Hepatic Function  Total Protein 6.0 - 8.3 g/dL 8.0  7.4  7.5   Albumin 3.5 - 5.2 g/dL 4.4  4.5  4.6   AST 0 - 37 U/L 22  21  17    ALT 0 - 35 U/L 16  17  13    Alk Phosphatase 39 - 117  U/L 83  77  84   Total Bilirubin 0.2 - 1.2 mg/dL 0.3  0.3  0.3       Current Medications:   Current Outpatient Medications (Endocrine & Metabolic):    dapagliflozin propanediol (FARXIGA) 10 MG TABS tablet, Take 1 tablet (10 mg total) by mouth in the morning.   glipiZIDE (GLUCOTROL XL) 5 MG 24 hr tablet, Take 1 tablet (5 mg total) by mouth daily with breakfast.  Current Outpatient Medications (Cardiovascular):    amLODipine (NORVASC) 10 MG tablet, TAKE ONE TABLET BY MOUTH DAILY AT 9AM (VIAL)   hydrALAZINE (APRESOLINE) 50 MG tablet, Take 1 tablet (50 mg total) by mouth 3 (three) times daily.   metoprolol tartrate (LOPRESSOR) 100 MG tablet, TAKE ONE TABLET BY MOUTH TWICE DAILY @9AM -5PM   niacin (VITAMIN B3) 500 MG ER tablet, Take 1 tablet (500 mg total) by mouth daily.   pravastatin (PRAVACHOL) 40 MG tablet, TAKE ONE TABLET BY MOUTH DAILY AT 5PM (VIAL)   valsartan  (DIOVAN) 320 MG tablet, TAKE ONE TABLET BY MOUTH DAILY AT 9AM (VIAL)  Current Outpatient Medications (Respiratory):    levocetirizine (XYZAL) 5 MG tablet, Take 1 tablet (5 mg total) by mouth daily as needed for allergies.  Current Outpatient Medications (Analgesics):    acetaminophen (TYLENOL) 650 MG CR tablet, Take 1 tablet (650 mg total) by mouth every 8 (eight) hours as needed for pain.   aspirin EC 81 MG tablet, Take 81 mg by mouth daily.   HYDROcodone-acetaminophen (NORCO/VICODIN) 5-325 MG tablet, Take 1 tablet by mouth daily as needed for moderate pain (pain score 4-6).  Current Outpatient Medications (Hematological):    cilostazol (PLETAL) 100 MG tablet, Take 1 tablet (100 mg total) by mouth 2 (two) times daily.  Current Outpatient Medications (Other):    busPIRone (BUSPAR) 5 MG tablet, Take 1 tablet (5 mg total) by mouth 2 (two) times daily.   CALCIUM-VITAMIN D PO, Take 1 tablet by mouth daily.   diclofenac Sodium (VOLTAREN) 1 % GEL, APPLY 2 GRAMS TOPICALLY FOUR TIMES DAILY   diphenoxylate-atropine (LOMOTIL) 2.5-0.025 MG tablet, Take 1 tablet by mouth 4 (four) times daily as needed for diarrhea or loose stools (Max dose of 8 tabs in 24 hours).   LINZESS 145 MCG CAPS capsule, Take 1 capsule (145 mcg total) by mouth daily before breakfast.   polyethylene glycol powder (GLYCOLAX/MIRALAX) 17 GM/SCOOP powder, Take 17 g by mouth daily as needed for constipation.   sertraline (ZOLOFT) 100 MG tablet, TAKE ONE TABLET BY MOUTH DAILY AT 9AM IN ADDITION TO 50 MG TABLET (VIAL)   sertraline (ZOLOFT) 50 MG tablet, TAKE ONE TABLET BY MOUTH DAILY AT 5PM EVERY EVENING   zolpidem (AMBIEN CR) 12.5 MG CR tablet, Take 1 tablet (12.5 mg total) by mouth at bedtime.  Medical History:  Past Medical History:  Diagnosis Date   Abdominal pain 10/11/2012   Anemia 05/31/2011   Angina    Anxiety    Arthritis    "knees" (07/20/2013)   Atypical chest pain 05/31/2011   Carotid stenosis, asymptomatic 04/18/2014    Claudication (HCC)    COVID-19 virus infection 08/01/2018   Depression    GERD (gastroesophageal reflux disease)    Hematoma of neck 04/23/2014   High cholesterol    Hyperlipemia 05/31/2011   Hypokalemia 05/31/2011   Leucocytosis 05/31/2011   Myocardial infarction [facility]) 1990's   "1"   Neck pain on right side 10/04/2014   PAD (peripheral artery disease) (HCC)    Peripheral arterial disease (  HCC) 06/08/2013   Peripheral arterial disease   Radiculopathy of leg 10/11/2012   Stroke [facility])    "mini stroke 1st then regular stroke", denies residual on 07/20/2013   Wears glasses    Allergies: No Known Allergies   Surgical History:  She  has a past surgical history that includes Abdominal hysterectomy; Tubal ligation; Esophagogastroduodenoscopy (N/A, 10/13/2012); Lower extremity angiogram (07/20/2013); Coronary angioplasty with stent; Balloon angioplasty, artery (10/05/2013); Atherectomy (10/05/2013); lower extremity angiogram (N/A, 07/06/2013); lower extremity angiogram (N/A, 07/20/2013); lower extremity angiogram (N/A, 10/05/2013); Endarterectomy (Left, 02/07/2014); Patch angioplasty (Left, 02/07/2014); Carotid endarterectomy; Endarterectomy (Right, 04/18/2014); Patch angioplasty (Right, 04/18/2014); Endarterectomy (N/A, 04/24/2014); Cardiac catheterization (N/A, 07/05/2014); Mass excision (Right, 08/22/2016); Kenolog injection (Bilateral, 08/22/2016); and Mass excision (Right, 09/18/2020). Family History:  Her family history is not on file.  REVIEW OF SYSTEMS  : All other systems reviewed and negative except where noted in the History of Present Illness.  PHYSICAL EXAM: There were no vitals taken for this visit. General Appearance: Well nourished, in no apparent distress. Head:   Normocephalic and atraumatic. Eyes:  sclerae anicteric,conjunctive pink  Respiratory: Respiratory effort normal, BS equal bilaterally without rales, rhonchi, wheezing. Cardio: RRR with no MRGs. Peripheral pulses intact.  Abdomen: Soft,   {BlankSingle:19197::"Flat","Obese","Non-distended"} ,active bowel sounds. {actendernessAB:27319} tenderness {anatomy; site abdomen:5010}. {BlankMultiple:19196::"Without guarding","With guarding","Without rebound","With rebound"}. No masses. Rectal: {acrectalexam:27461} Musculoskeletal: Full ROM, {PSY - GAIT AND STATION:22860} gait. {With/Without:[id number]} edema. Skin:  Dry and intact without significant lesions or rashes Neuro: Alert and  oriented x4;  No focal deficits. Psych:  Cooperative. Normal mood and affect.    Doree Albee, PA-C 1:19 PM

## 2023-02-27 ENCOUNTER — Ambulatory Visit: Payer: 59 | Admitting: Physician Assistant

## 2023-02-27 DIAGNOSIS — K760 Fatty (change of) liver, not elsewhere classified: Secondary | ICD-10-CM

## 2023-02-27 DIAGNOSIS — R197 Diarrhea, unspecified: Secondary | ICD-10-CM

## 2023-02-27 DIAGNOSIS — I2510 Atherosclerotic heart disease of native coronary artery without angina pectoris: Secondary | ICD-10-CM

## 2023-02-27 DIAGNOSIS — I739 Peripheral vascular disease, unspecified: Secondary | ICD-10-CM

## 2023-02-27 DIAGNOSIS — K5903 Drug induced constipation: Secondary | ICD-10-CM

## 2023-03-10 NOTE — Progress Notes (Unsigned)
Patient name: Joanne Morris MRN: [id number] DOB: 1950-05-20 Sex: female  REASON FOR VISIT: Follow-up for surveillance carotid artery disease and PAD  HPI: Joanne Morris is a 73 y.o. female with history of carotid stenosis status post bilateral carotid endarterectomies by Dr. Darrick Penna, hypertension, hyperlipidemia, coronary artery disease status post remote MI, diabetes, PAD that presents for surveillance of PAD and carotid artery disease.  Reports no strokes or TIAs since last follow-up.  She is most bothered by burning in the right calf after walking 15 to 20 minutes.  No rest pain or wounds.   She has previously been under care of Dr. Jacinto Halim.  She has previously undergone a right peroneal atherectomy 2015.  She has single-vessel runoff in the peroneal artery on the right per his note as well as single vessel peroneal runoff on the left.    Past Medical History:  Diagnosis Date   Abdominal pain 10/11/2012   Anemia 05/31/2011   Angina    Anxiety    Arthritis    "knees" (07/20/2013)   Atypical chest pain 05/31/2011   Carotid stenosis, asymptomatic 04/18/2014   Claudication (HCC)    COVID-19 virus infection 08/01/2018   Depression    GERD (gastroesophageal reflux disease)    Hematoma of neck 04/23/2014   High cholesterol    Hyperlipemia 05/31/2011   Hypokalemia 05/31/2011   Leucocytosis 05/31/2011   Myocardial infarction [facility]) 1990's   "1"   Neck pain on right side 10/04/2014   PAD (peripheral artery disease) (HCC)    Peripheral arterial disease (HCC) 06/08/2013   Peripheral arterial disease   Radiculopathy of leg 10/11/2012   Stroke [facility])    "mini stroke 1st then regular stroke", denies residual on 07/20/2013   Wears glasses     Past Surgical History:  Procedure Laterality Date   ABDOMINAL HYSTERECTOMY     ATHERECTOMY  10/05/2013   BALLOON ANGIOPLASTY, ARTERY  10/05/2013   DR Jacinto Halim   CAROTID ENDARTERECTOMY     CORONARY ANGIOPLASTY WITH STENT PLACEMENT     "1"   ENDARTERECTOMY Left 02/07/2014    Procedure: ENDARTERECTOMY CAROTID;  Surgeon: Sherren Kerns, MD;  Location: [facility] OR;  Service: Vascular;  Laterality: Left;   ENDARTERECTOMY Right 04/18/2014   Procedure: ENDARTERECTOMY RIGHT CAROTID;  Surgeon: Sherren Kerns, MD;  Location: [facility] OR;  Service: Vascular;  Laterality: Right;   ENDARTERECTOMY N/A 04/24/2014   Procedure: IRRIGATION AND DEBRIDEMENT OF RIGHT NECK ;  Surgeon: Sherren Kerns, MD;  Location: [facility] OR;  Service: Vascular;  Laterality: N/A;   ESOPHAGOGASTRODUODENOSCOPY N/A 10/13/2012   Procedure: ESOPHAGOGASTRODUODENOSCOPY (EGD);  Surgeon: Iva Boop, MD;  Location: [facility] ENDOSCOPY;  Service: Endoscopy;  Laterality: N/A;   KENALOG INJECTION Bilateral 08/22/2016   Procedure: KENALOG INJECTION BILATERAL NECK;  Surgeon: Peggye Form, DO;  Location: [facility];  Service: Plastics;  Laterality: Bilateral;   LOWER EXTREMITY ANGIOGRAM  07/20/2013   Unsuccessful attempt at crossing the CTO/notes 07/20/2013   LOWER EXTREMITY ANGIOGRAM N/A 07/06/2013   Procedure: LOWER EXTREMITY ANGIOGRAM;  Surgeon: Pamella Pert, MD;  Location: [facility] CATH LAB;  Service: Cardiovascular;  Laterality: N/A;   LOWER EXTREMITY ANGIOGRAM N/A 07/20/2013   Procedure: LOWER EXTREMITY ANGIOGRAM;  Surgeon: Pamella Pert, MD;  Location: [facility] CATH LAB;  Service: Cardiovascular;  Laterality: N/A;   LOWER EXTREMITY ANGIOGRAM N/A 10/05/2013   Procedure: LOWER EXTREMITY ANGIOGRAM;  Surgeon: Pamella Pert, MD;  Location: [facility] CATH LAB;  Service: Cardiovascular;  Laterality: N/A;  MASS EXCISION Right 08/22/2016   Procedure: EXCISION RIGHT NECK KELOID;  Surgeon: Peggye Form, DO;  Location: [facility];  Service: Plastics;  Laterality: Right;   MASS EXCISION Right 09/18/2020   Procedure: Excision of right neck keloid;  Surgeon: Peggye Form, DO;  Location: [facility] OR;  Service: Plastics;  Laterality: Right;   PATCH ANGIOPLASTY Left 02/07/2014   Procedure: PATCH ANGIOPLASTY  Carotid;  Surgeon: Sherren Kerns, MD;  Location: [facility] OR;  Service: Vascular;  Laterality: Left;   PATCH ANGIOPLASTY Right 04/18/2014   Procedure: PATCH ANGIOPLASTY USING HEMASHIELD 0.8cmx 7.6cm PATCH;  Surgeon: Sherren Kerns, MD;  Location: [facility] OR;  Service: Vascular;  Laterality: Right;   PERIPHERAL VASCULAR CATHETERIZATION N/A 07/05/2014   Procedure: Lower Extremity Angiography;  Surgeon: Yates Decamp, MD;  Location: [facility] INVASIVE CV LAB;  Service: Cardiovascular;  Laterality: N/A;   TUBAL LIGATION      No family history on file.  SOCIAL HISTORY: Social History   Tobacco Use   Smoking status: Former    Current packs/day: 0.00    Average packs/day: 0.5 packs/day for 4.0 years (2.0 ttl pk-yrs)    Types: Cigarettes    Start date: 05/30/2005    Quit date: 05/30/2009    Years since quitting: 13.7   Smokeless tobacco: Never  Substance Use Topics   Alcohol use: No    Alcohol/week: 0.0 standard drinks of alcohol    No Known Allergies   Current Outpatient Medications  Medication Sig Dispense Refill   acetaminophen (TYLENOL) 650 MG CR tablet Take 1 tablet (650 mg total) by mouth every 8 (eight) hours as needed for pain. 90 tablet 5   amLODipine (NORVASC) 10 MG tablet TAKE ONE TABLET BY MOUTH DAILY AT 9AM (VIAL) 90 tablet 3   aspirin EC 81 MG tablet Take 81 mg by mouth daily.     busPIRone (BUSPAR) 5 MG tablet Take 1 tablet (5 mg total) by mouth 2 (two) times daily. 60 tablet 5   CALCIUM-VITAMIN D PO Take 1 tablet by mouth daily.     cilostazol (PLETAL) 100 MG tablet Take 1 tablet (100 mg total) by mouth 2 (two) times daily. 180 tablet 3   dapagliflozin propanediol (FARXIGA) 10 MG TABS tablet Take 1 tablet (10 mg total) by mouth in the morning. 90 tablet 3   diclofenac Sodium (VOLTAREN) 1 % GEL APPLY 2 GRAMS TOPICALLY FOUR TIMES DAILY 100 g 3   diphenoxylate-atropine (LOMOTIL) 2.5-0.025 MG tablet Take 1 tablet by mouth 4 (four) times daily as needed for diarrhea or loose stools (Max dose of  8 tabs in 24 hours). 30 tablet 1   glipiZIDE (GLUCOTROL XL) 5 MG 24 hr tablet Take 1 tablet (5 mg total) by mouth daily with breakfast. 90 tablet 3   hydrALAZINE (APRESOLINE) 50 MG tablet Take 1 tablet (50 mg total) by mouth 3 (three) times daily. 270 tablet 3   HYDROcodone-acetaminophen (NORCO/VICODIN) 5-325 MG tablet Take 1 tablet by mouth daily as needed for moderate pain (pain score 4-6). 30 tablet 0   levocetirizine (XYZAL) 5 MG tablet Take 1 tablet (5 mg total) by mouth daily as needed for allergies. 90 tablet 3   LINZESS 145 MCG CAPS capsule Take 1 capsule (145 mcg total) by mouth daily before breakfast.     metoprolol tartrate (LOPRESSOR) 100 MG tablet TAKE ONE TABLET BY MOUTH TWICE DAILY @9AM -5PM 180 tablet 1   niacin (VITAMIN B3) 500 MG ER tablet Take 1 tablet (500 mg  total) by mouth daily. 90 tablet 3   polyethylene glycol powder (GLYCOLAX/MIRALAX) 17 GM/SCOOP powder Take 17 g by mouth daily as needed for constipation.     pravastatin (PRAVACHOL) 40 MG tablet TAKE ONE TABLET BY MOUTH DAILY AT 5PM (VIAL) 90 tablet 3   sertraline (ZOLOFT) 100 MG tablet TAKE ONE TABLET BY MOUTH DAILY AT 9AM IN ADDITION TO 50 MG TABLET (VIAL) 90 tablet 3   sertraline (ZOLOFT) 50 MG tablet TAKE ONE TABLET BY MOUTH DAILY AT 5PM EVERY EVENING 90 tablet 3   valsartan (DIOVAN) 320 MG tablet TAKE ONE TABLET BY MOUTH DAILY AT 9AM (VIAL) 100 tablet 2   zolpidem (AMBIEN CR) 12.5 MG CR tablet Take 1 tablet (12.5 mg total) by mouth at bedtime. 30 tablet 5   No current facility-administered medications for this visit.    REVIEW OF SYSTEMS:  [X]  denotes positive finding, [ ]  denotes negative finding Cardiac  Comments:  Chest pain or chest pressure:    Shortness of breath upon exertion:    Short of breath when lying flat:    Irregular heart rhythm:        Vascular    Pain in calf, thigh, or hip brought on by ambulation: x Both legs  Pain in feet at night that wakes you up from your sleep:     Blood clot in your  veins:    Leg swelling:         Pulmonary    Oxygen at home:    Productive cough:     Wheezing:         Neurologic    Sudden weakness in arms or legs:     Sudden numbness in arms or legs:     Sudden onset of difficulty speaking or slurred speech:    Temporary loss of vision in one eye:     Problems with dizziness:         Gastrointestinal    Blood in stool:     Vomited blood:         Genitourinary    Burning when urinating:     Blood in urine:        Psychiatric    Major depression:         Hematologic    Bleeding problems:    Problems with blood clotting too easily:        Skin    Rashes or ulcers:        Constitutional    Fever or chills:      PHYSICAL EXAM: There were no vitals filed for this visit.   GENERAL: The patient is a well-nourished female, in no acute distress. The vital signs are documented above. CARDIAC: There is a regular rate and rhythm.  VASCULAR:  Palpable femoral pulses bilaterally No palpable pedal pulses No lower extremity tissue loss PULMONARY: No respiratory distress. ABDOMEN: Soft and non-tender. MUSCULOSKELETAL: There are no major deformities or cyanosis. NEUROLOGIC: No focal weakness or paresthesias are detected. SKIN: There are no ulcers or rashes noted. PSYCHIATRIC: The patient has a normal affect.  DATA:   Carotid duplex 12/03/22 shows no evidence of carotid stenosis in right or left ICA  ABIs 12/03/22 are 0.73 right monophasic and 0.79 left monophasic    Assessment/Plan:  73 year old female that has been under the care of Dr. Darrick Penna and has undergone bilateral carotid endarterectomies that presents for surveillance of her PAD and carotid artery disease.  Discussed no significant stenosis on carotid duplex today.  Her main complaint is burning in the right calf after walking 15 to 20 minutes consistent with claudication.  I discussed if she is walking 15 to 20 minutes without stopping that is pretty good and I would not  recommend intervention until this becomes lifestyle limiting.  No evidence of disabling symptoms at this time.  She has known single-vessel peroneal runoff bilaterally and has previously been under the care of Dr. Jacinto Halim.   Cephus Shelling, MD [facility] Office: [phone]

## 2023-03-11 ENCOUNTER — Encounter: Payer: Self-pay | Admitting: Vascular Surgery

## 2023-03-11 ENCOUNTER — Ambulatory Visit (INDEPENDENT_AMBULATORY_CARE_PROVIDER_SITE_OTHER): Payer: 59 | Admitting: Vascular Surgery

## 2023-03-11 VITALS — BP 178/82 | HR 78 | Temp 97.2°F | Resp 18 | Ht 59.0 in | Wt 143.3 lb

## 2023-03-11 DIAGNOSIS — I6523 Occlusion and stenosis of bilateral carotid arteries: Secondary | ICD-10-CM

## 2023-03-11 DIAGNOSIS — I739 Peripheral vascular disease, unspecified: Secondary | ICD-10-CM

## 2023-03-18 ENCOUNTER — Ambulatory Visit: Payer: 59 | Admitting: Family

## 2023-03-18 NOTE — Progress Notes (Deleted)
 Patient ID: Joanne Morris, female    DOB: 02/25/1950, 73 y.o.   MRN: [id number]  No chief complaint on file.           Assessment & Plan:   Subjective:    Outpatient Medications Prior to Visit  Medication Sig Dispense Refill   acetaminophen  (TYLENOL ) 650 MG CR tablet Take 1 tablet (650 mg total) by mouth every 8 (eight) hours as needed for pain. 90 tablet 5   amLODipine  (NORVASC ) 10 MG tablet TAKE ONE TABLET BY MOUTH DAILY AT 9AM (VIAL) 90 tablet 3   aspirin  EC 81 MG tablet Take 81 mg by mouth daily.     busPIRone  (BUSPAR ) 5 MG tablet Take 1 tablet (5 mg total) by mouth 2 (two) times daily. 60 tablet 5   CALCIUM -VITAMIN D PO Take 1 tablet by mouth daily.     cilostazol  (PLETAL ) 100 MG tablet Take 1 tablet (100 mg total) by mouth 2 (two) times daily. 180 tablet 3   dapagliflozin  propanediol (FARXIGA ) 10 MG TABS tablet Take 1 tablet (10 mg total) by mouth in the morning. 90 tablet 3   diclofenac  Sodium (VOLTAREN ) 1 % GEL APPLY 2 GRAMS TOPICALLY FOUR TIMES DAILY 100 g 3   diphenoxylate -atropine  (LOMOTIL ) 2.5-0.025 MG tablet Take 1 tablet by mouth 4 (four) times daily as needed for diarrhea or loose stools (Max dose of 8 tabs in 24 hours). 30 tablet 1   glipiZIDE  (GLUCOTROL  XL) 5 MG 24 hr tablet Take 1 tablet (5 mg total) by mouth daily with breakfast. 90 tablet 3   hydrALAZINE  (APRESOLINE ) 50 MG tablet Take 1 tablet (50 mg total) by mouth 3 (three) times daily. 270 tablet 3   HYDROcodone -acetaminophen  (NORCO/VICODIN) 5-325 MG tablet Take 1 tablet by mouth daily as needed for moderate pain (pain score 4-6). 30 tablet 0   levocetirizine (XYZAL ) 5 MG tablet Take 1 tablet (5 mg total) by mouth daily as needed for allergies. 90 tablet 3   LINZESS  145 MCG CAPS capsule Take 1 capsule (145 mcg total) by mouth daily before breakfast.     metoprolol  tartrate (LOPRESSOR ) 100 MG tablet TAKE ONE TABLET BY MOUTH TWICE DAILY @9AM -5PM 180 tablet 1   niacin  (VITAMIN B3) 500 MG ER tablet Take 1 tablet (500 mg  total) by mouth daily. 90 tablet 3   polyethylene glycol powder (GLYCOLAX /MIRALAX ) 17 GM/SCOOP powder Take 17 g by mouth daily as needed for constipation.     pravastatin  (PRAVACHOL ) 40 MG tablet TAKE ONE TABLET BY MOUTH DAILY AT 5PM (VIAL) 90 tablet 3   sertraline  (ZOLOFT ) 100 MG tablet TAKE ONE TABLET BY MOUTH DAILY AT 9AM IN ADDITION TO 50 MG TABLET (VIAL) 90 tablet 3   sertraline  (ZOLOFT ) 50 MG tablet TAKE ONE TABLET BY MOUTH DAILY AT 5PM EVERY EVENING 90 tablet 3   valsartan  (DIOVAN ) 320 MG tablet TAKE ONE TABLET BY MOUTH DAILY AT 9AM (VIAL) 100 tablet 2   zolpidem  (AMBIEN  CR) 12.5 MG CR tablet Take 1 tablet (12.5 mg total) by mouth at bedtime. 30 tablet 5   No facility-administered medications prior to visit.   Past Medical History:  Diagnosis Date   Abdominal pain 10/11/2012   Anemia 05/31/2011   Angina    Anxiety    Arthritis    knees (07/20/2013)   Atypical chest pain 05/31/2011   Carotid stenosis, asymptomatic 04/18/2014   Claudication (HCC)    COVID-19 virus infection 08/01/2018   Depression    GERD (gastroesophageal reflux disease)  Hematoma of neck 04/23/2014   High cholesterol    Hyperlipemia 05/31/2011   Hypokalemia 05/31/2011   Leucocytosis 05/31/2011   Myocardial infarction [facility]) 1990's   1   Neck pain on right side 10/04/2014   PAD (peripheral artery disease) (HCC)    Peripheral arterial disease (HCC) 06/08/2013   Peripheral arterial disease   Radiculopathy of leg 10/11/2012   Stroke [facility])    mini stroke 1st then regular stroke, denies residual on 07/20/2013   Wears glasses    Past Surgical History:  Procedure Laterality Date   ABDOMINAL HYSTERECTOMY     ATHERECTOMY  10/05/2013   BALLOON ANGIOPLASTY, ARTERY  10/05/2013   DR LADONA   CAROTID ENDARTERECTOMY     CORONARY ANGIOPLASTY WITH STENT PLACEMENT     1   ENDARTERECTOMY Left 02/07/2014   Procedure: ENDARTERECTOMY CAROTID;  Surgeon: Carlin FORBES Haddock, MD;  Location: [facility] OR;  Service: Vascular;  Laterality:  Left;   ENDARTERECTOMY Right 04/18/2014   Procedure: ENDARTERECTOMY RIGHT CAROTID;  Surgeon: Carlin FORBES Haddock, MD;  Location: [facility] OR;  Service: Vascular;  Laterality: Right;   ENDARTERECTOMY N/A 04/24/2014   Procedure: IRRIGATION AND DEBRIDEMENT OF RIGHT NECK ;  Surgeon: Carlin FORBES Haddock, MD;  Location: [facility] OR;  Service: Vascular;  Laterality: N/A;   ESOPHAGOGASTRODUODENOSCOPY N/A 10/13/2012   Procedure: ESOPHAGOGASTRODUODENOSCOPY (EGD);  Surgeon: Lupita FORBES Commander, MD;  Location: [facility] ENDOSCOPY;  Service: Endoscopy;  Laterality: N/A;   KENALOG  INJECTION Bilateral 08/22/2016   Procedure: KENALOG  INJECTION BILATERAL NECK;  Surgeon: Lowery Estefana RAMAN, DO;  Location: [facility];  Service: Plastics;  Laterality: Bilateral;   LOWER EXTREMITY ANGIOGRAM  07/20/2013   Unsuccessful attempt at crossing the CTO/notes 07/20/2013   LOWER EXTREMITY ANGIOGRAM N/A 07/06/2013   Procedure: LOWER EXTREMITY ANGIOGRAM;  Surgeon: Erick JONELLE Ladona, MD;  Location: [facility] CATH LAB;  Service: Cardiovascular;  Laterality: N/A;   LOWER EXTREMITY ANGIOGRAM N/A 07/20/2013   Procedure: LOWER EXTREMITY ANGIOGRAM;  Surgeon: Erick JONELLE Ladona, MD;  Location: [facility] CATH LAB;  Service: Cardiovascular;  Laterality: N/A;   LOWER EXTREMITY ANGIOGRAM N/A 10/05/2013   Procedure: LOWER EXTREMITY ANGIOGRAM;  Surgeon: Erick JONELLE Ladona, MD;  Location: [facility] CATH LAB;  Service: Cardiovascular;  Laterality: N/A;   MASS EXCISION Right 08/22/2016   Procedure: EXCISION RIGHT NECK KELOID;  Surgeon: Lowery Estefana RAMAN, DO;  Location: [facility];  Service: Plastics;  Laterality: Right;   MASS EXCISION Right 09/18/2020   Procedure: Excision of right neck keloid;  Surgeon: Lowery Estefana RAMAN, DO;  Location: [facility] OR;  Service: Plastics;  Laterality: Right;   PATCH ANGIOPLASTY Left 02/07/2014   Procedure: PATCH ANGIOPLASTY Carotid;  Surgeon: Carlin FORBES Haddock, MD;  Location: [facility] OR;  Service: Vascular;  Laterality: Left;   PATCH ANGIOPLASTY Right  04/18/2014   Procedure: PATCH ANGIOPLASTY USING HEMASHIELD 0.8cmx 7.6cm PATCH;  Surgeon: Carlin FORBES Haddock, MD;  Location: [facility] OR;  Service: Vascular;  Laterality: Right;   PERIPHERAL VASCULAR CATHETERIZATION N/A 07/05/2014   Procedure: Lower Extremity Angiography;  Surgeon: Gordy Ladona, MD;  Location: [facility] INVASIVE CV LAB;  Service: Cardiovascular;  Laterality: N/A;   TUBAL LIGATION     No Known Allergies    Objective:    Physical Exam Vitals and nursing note reviewed.  Constitutional:      Appearance: Normal appearance.  Cardiovascular:     Rate and Rhythm: Normal rate and regular rhythm.  Pulmonary:     Effort: Pulmonary effort is normal.     Breath  sounds: Normal breath sounds.  Musculoskeletal:        General: Normal range of motion.  Skin:    General: Skin is warm and dry.  Neurological:     Mental Status: She is alert.  Psychiatric:        Mood and Affect: Mood normal.        Behavior: Behavior normal.    There were no vitals taken for this visit. Wt Readings from Last 3 Encounters:  03/11/23 143 lb 4.8 oz (65 kg)  01/01/23 142 lb 8 oz (64.6 kg)  10/22/22 142 lb 6 oz (64.6 kg)       Lucius Krabbe, NP

## 2023-03-25 ENCOUNTER — Institutional Professional Consult (permissible substitution): Payer: 59 | Admitting: Plastic Surgery

## 2023-03-26 ENCOUNTER — Other Ambulatory Visit: Payer: Self-pay | Admitting: Family

## 2023-03-26 DIAGNOSIS — F411 Generalized anxiety disorder: Secondary | ICD-10-CM

## 2023-03-27 ENCOUNTER — Other Ambulatory Visit: Payer: Self-pay

## 2023-03-27 DIAGNOSIS — I739 Peripheral vascular disease, unspecified: Secondary | ICD-10-CM

## 2023-03-27 DIAGNOSIS — I6523 Occlusion and stenosis of bilateral carotid arteries: Secondary | ICD-10-CM

## 2023-03-28 DIAGNOSIS — M25561 Pain in right knee: Secondary | ICD-10-CM | POA: Diagnosis not present

## 2023-03-28 DIAGNOSIS — M1711 Unilateral primary osteoarthritis, right knee: Secondary | ICD-10-CM | POA: Diagnosis not present

## 2023-04-03 ENCOUNTER — Other Ambulatory Visit: Payer: Self-pay | Admitting: Family

## 2023-04-03 DIAGNOSIS — G894 Chronic pain syndrome: Secondary | ICD-10-CM

## 2023-04-03 DIAGNOSIS — R197 Diarrhea, unspecified: Secondary | ICD-10-CM

## 2023-04-03 NOTE — Telephone Encounter (Unsigned)
Copied from CRM [id number]. Topic: Clinical - Medication Refill >> Apr 03, 2023  2:29 PM Fuller Mandril wrote: Most Recent Primary Care Visit:  Provider: Dulce Sellar  Department: [facility]  Visit Type: OFFICE VISIT  Date: 01/01/2023  Medication:  diphenoxylate-atropine (LOMOTIL) 2.5-0.025 MG tablet HYDROcodone-acetaminophen (NORCO/VICODIN) 5-325 MG tablet  Has the patient contacted their pharmacy? No (Agent: If no, request that the patient contact the pharmacy for the refill. If patient does not wish to contact the pharmacy document the reason why and proceed with request.) (Agent: If yes, when and what did the pharmacy advise?)  Is this the correct pharmacy for this prescription? Yes If no, delete pharmacy and type the correct one.  This is the patient's preferred pharmacy:  [id number] Rosalita Levan, [place] - [address] AT [facility] [address] [place] [address] Phone: [phone] Fax: [phone]   Has the prescription been filled recently? No  Is the patient out of the medication? Yes  Has the patient been seen for an appointment in the last year OR does the patient have an upcoming appointment? Yes 01/01/23 scheduled for 3/11  Can we respond through MyChart? No  Agent: Please be advised that Rx refills may take up to 3 business days. We ask that you follow-up with your pharmacy.

## 2023-04-04 ENCOUNTER — Telehealth: Payer: Self-pay

## 2023-04-04 MED ORDER — DIPHENOXYLATE-ATROPINE 2.5-0.025 MG PO TABS: 1.0000 | ORAL_TABLET | Freq: Four times a day (QID) | ORAL | 1 refills | Status: DC | PRN

## 2023-04-04 MED ORDER — HYDROCODONE-ACETAMINOPHEN 5-325 MG PO TABS: 1.0000 | ORAL_TABLET | Freq: Every day | ORAL | 0 refills | Status: DC | PRN

## 2023-04-04 NOTE — Telephone Encounter (Signed)
Pt requesting refill for Lomotil and Norco. Last OV 01/01/2023.

## 2023-04-04 NOTE — Telephone Encounter (Signed)
Copied from CRM [id number]. Topic: Clinical - Prescription Issue >> Apr 04, 2023 11:57 AM Eunice Blase wrote: Reason for CRM: Pt called stated that pharmacy said HYDROcodone-acetaminophen (NORCO/VICODIN) 5-325 MG tablet [[id number]] and diphenoxylate-atropine (LOMOTIL) 2.5-0.025 MG tablet, they have not received approval/script. Please call pt. at [phone].  Please advise

## 2023-04-04 NOTE — Telephone Encounter (Signed)
Copied from CRM [id number]. Topic: Clinical - Medication Question >> Apr 04, 2023 12:10 PM Almira Coaster wrote: Reason for CRM: Patient is calling to follow up on request for diphenoxylate-atropine & HYDROcodone-acetaminophen, advised patient of the turnaround time. Patient states she would like to have them before the weekend.   Please advise

## 2023-04-04 NOTE — Telephone Encounter (Signed)
both RX have been sent in

## 2023-04-13 DIAGNOSIS — N6325 Unspecified lump in the left breast, overlapping quadrants: Secondary | ICD-10-CM | POA: Diagnosis not present

## 2023-04-13 DIAGNOSIS — N6321 Unspecified lump in the left breast, upper outer quadrant: Secondary | ICD-10-CM | POA: Diagnosis not present

## 2023-04-14 ENCOUNTER — Telehealth: Payer: Self-pay | Admitting: Family

## 2023-04-14 ENCOUNTER — Telehealth: Payer: Self-pay

## 2023-04-14 DIAGNOSIS — M25561 Pain in right knee: Secondary | ICD-10-CM | POA: Diagnosis not present

## 2023-04-14 NOTE — Transitions of Care (Post Inpatient/ED Visit) (Signed)
 04/14/2023  Name: Joanne Morris MRN: [id number] DOB: Jun 25, 1950  Today's TOC FU Call Status: Today's TOC FU Call Status:: Successful TOC FU Call Completed TOC FU Call Complete Date: 04/14/23 Patient's Name and Date of Birth confirmed.  Transition Care Management Follow-up Telephone Call Date of Discharge: 04/13/23 Discharge Facility: Other ([facility]) Name of Other ([facility]) Discharge Facility: [facility] Type of Discharge: Emergency Department Reason for ED Visit: Other: (nodule in left breast) How have you been since you were released from the hospital?: Same Any questions or concerns?: No  Items Reviewed: Did you receive and understand the discharge instructions provided?: Yes Medications obtained,verified, and reconciled?: Yes (Medications Reviewed) Any new allergies since your discharge?: No Dietary orders reviewed?: Yes Do you have support at home?: No  Medications Reviewed Today: Medications Reviewed Today     Reviewed by Karena Addison, LPN (Licensed Practical Nurse) on 04/14/23 at 1527  Med List Status: <None>   Medication Order Taking? Sig Documenting Provider Last Dose Status Informant  acetaminophen (TYLENOL) 650 MG CR tablet [id number] No Take 1 tablet (650 mg total) by mouth every 8 (eight) hours as needed for pain. Dulce Sellar, NP Taking Active   amLODipine (NORVASC) 10 MG tablet [id number] No TAKE ONE TABLET BY MOUTH DAILY AT 9AM (VIAL) Dulce Sellar, NP Taking Active   aspirin EC 81 MG tablet [id number] No Take 81 mg by mouth daily. [provider] Taking Active Self  busPIRone (BUSPAR) 5 MG tablet [id number]  TAKE ONE TABLET (5MG  TOTAL) BY MOUTH TWICE DAILY Hudnell, Stephanie, NP  Active   CALCIUM-VITAMIN D PO [id number] No Take 1 tablet by mouth daily. [provider] Taking Active Self  cilostazol (PLETAL) 100 MG tablet [id number] No Take 1 tablet (100 mg total) by mouth 2 (two) times daily. Dulce Sellar, NP Taking Active    dapagliflozin propanediol (FARXIGA) 10 MG TABS tablet [id number] No Take 1 tablet (10 mg total) by mouth in the morning. Dulce Sellar, NP Taking Active   diclofenac Sodium (VOLTAREN) 1 % GEL [id number] No APPLY 2 GRAMS TOPICALLY FOUR TIMES DAILY Hudnell, Stephanie, NP Taking Active   diphenoxylate-atropine (LOMOTIL) 2.5-0.025 MG tablet [id number]  Take 1 tablet by mouth 4 (four) times daily as needed for diarrhea or loose stools (Max dose of 8 tabs in 24 hours). Dulce Sellar, NP  Active   glipiZIDE (GLUCOTROL XL) 5 MG 24 hr tablet [id number] No Take 1 tablet (5 mg total) by mouth daily with breakfast. Dulce Sellar, NP Taking Active   hydrALAZINE (APRESOLINE) 50 MG tablet [id number] No Take 1 tablet (50 mg total) by mouth 3 (three) times daily. Dulce Sellar, NP Taking Expired 01/20/23 2359   HYDROcodone-acetaminophen (NORCO/VICODIN) 5-325 MG tablet [id number]  Take 1 tablet by mouth daily as needed for moderate pain (pain score 4-6). Dulce Sellar, NP  Active   levocetirizine (XYZAL) 5 MG tablet [id number] No Take 1 tablet (5 mg total) by mouth daily as needed for allergies. Dulce Sellar, NP Taking Active   LINZESS 145 MCG CAPS capsule [id number] No Take 1 capsule (145 mcg total) by mouth daily before breakfast. Dulce Sellar, NP Taking Active   metoprolol tartrate (LOPRESSOR) 100 MG tablet [id number] No TAKE ONE TABLET BY MOUTH TWICE DAILY @9AM -5PM Dulce Sellar, NP Taking Active   niacin (VITAMIN B3) 500 MG ER tablet [id number] No Take 1 tablet (500 mg total) by mouth daily. Dulce Sellar, NP Taking Active   polyethylene glycol powder (GLYCOLAX/MIRALAX) 17 GM/SCOOP powder [id number] No Take 17 g by mouth daily  as needed for constipation. [provider] Taking Active Self  pravastatin (PRAVACHOL) 40 MG tablet [id number] No TAKE ONE TABLET BY MOUTH DAILY AT 5PM (VIAL) Dulce Sellar, NP Taking Active   sertraline (ZOLOFT) 100 MG tablet [id number] No  TAKE ONE TABLET BY MOUTH DAILY AT 9AM IN ADDITION TO 50 MG TABLET (VIAL) Dulce Sellar, NP Taking Active   sertraline (ZOLOFT) 50 MG tablet [id number] No TAKE ONE TABLET BY MOUTH DAILY AT 5PM EVERY EVENING Dulce Sellar, NP Taking Active   valsartan (DIOVAN) 320 MG tablet [id number] No TAKE ONE TABLET BY MOUTH DAILY AT 9AM (VIAL) Dulce Sellar, NP Taking Active   zolpidem (AMBIEN CR) 12.5 MG CR tablet [id number] No Take 1 tablet (12.5 mg total) by mouth at bedtime. Dulce Sellar, NP Taking Active             Home Care and Equipment/Supplies: Were Home Health Services Ordered?: NA Any new equipment or medical supplies ordered?: NA  Functional Questionnaire: Do you need assistance with bathing/showering or dressing?: No Do you need assistance with meal preparation?: No Do you need assistance with eating?: No Do you have difficulty maintaining continence: No Do you need assistance with getting out of bed/getting out of a chair/moving?: No Do you have difficulty managing or taking your medications?: No  Follow up appointments reviewed: Specialist Hospital Follow-up appointment confirmed?: NA Do you need transportation to your follow-up appointment?: No Do you understand care options if your condition(s) worsen?: Yes-patient verbalized understanding    SIGNATURE Karena Addison, LPN [facility] Nurse Health Advisor Direct Dial [phone]

## 2023-04-14 NOTE — Telephone Encounter (Unsigned)
 Copied from CRM [id number]. Topic: Clinical - Request for Lab/Test Order >> Apr 14, 2023  3:14 PM [place] J wrote: Reason for CRM: Lauren calling from [facility] stated that in order to continue treatment an order for bilateral diagnostic mammogram and left breast ultra sound needed.

## 2023-04-15 ENCOUNTER — Other Ambulatory Visit: Payer: Self-pay

## 2023-04-15 DIAGNOSIS — N63 Unspecified lump in unspecified breast: Secondary | ICD-10-CM

## 2023-04-15 DIAGNOSIS — S83241A Other tear of medial meniscus, current injury, right knee, initial encounter: Secondary | ICD-10-CM | POA: Diagnosis not present

## 2023-04-15 DIAGNOSIS — S83281A Other tear of lateral meniscus, current injury, right knee, initial encounter: Secondary | ICD-10-CM | POA: Diagnosis not present

## 2023-04-15 NOTE — Telephone Encounter (Signed)
 Please see msg regarding orders needing to be placed for patient and advise

## 2023-04-15 NOTE — Telephone Encounter (Signed)
 Orders placed per PCP approval

## 2023-04-21 DIAGNOSIS — M1711 Unilateral primary osteoarthritis, right knee: Secondary | ICD-10-CM | POA: Diagnosis not present

## 2023-04-22 ENCOUNTER — Ambulatory Visit: Payer: 59 | Admitting: Family

## 2023-04-22 NOTE — Progress Notes (Deleted)
 Patient ID: Joanne Morris, female    DOB: [birth year], 73 y.o.   MRN: [id number]  No chief complaint on file.           Assessment & Plan:   Subjective:    Outpatient Medications Prior to Visit  Medication Sig Dispense Refill   acetaminophen (TYLENOL) 650 MG CR tablet Take 1 tablet (650 mg total) by mouth every 8 (eight) hours as needed for pain. 90 tablet 5   amLODipine (NORVASC) 10 MG tablet TAKE ONE TABLET BY MOUTH DAILY AT 9AM (VIAL) 90 tablet 3   aspirin EC 81 MG tablet Take 81 mg by mouth daily.     busPIRone (BUSPAR) 5 MG tablet TAKE ONE TABLET (5MG  TOTAL) BY MOUTH TWICE DAILY 60 tablet 5   CALCIUM-VITAMIN D PO Take 1 tablet by mouth daily.     cilostazol (PLETAL) 100 MG tablet Take 1 tablet (100 mg total) by mouth 2 (two) times daily. 180 tablet 3   dapagliflozin propanediol (FARXIGA) 10 MG TABS tablet Take 1 tablet (10 mg total) by mouth in the morning. 90 tablet 3   diclofenac Sodium (VOLTAREN) 1 % GEL APPLY 2 GRAMS TOPICALLY FOUR TIMES DAILY 100 g 3   diphenoxylate-atropine (LOMOTIL) 2.5-0.025 MG tablet Take 1 tablet by mouth 4 (four) times daily as needed for diarrhea or loose stools (Max dose of 8 tabs in 24 hours). 30 tablet 1   glipiZIDE (GLUCOTROL XL) 5 MG 24 hr tablet Take 1 tablet (5 mg total) by mouth daily with breakfast. 90 tablet 3   hydrALAZINE (APRESOLINE) 50 MG tablet Take 1 tablet (50 mg total) by mouth 3 (three) times daily. 270 tablet 3   HYDROcodone-acetaminophen (NORCO/VICODIN) 5-325 MG tablet Take 1 tablet by mouth daily as needed for moderate pain (pain score 4-6). 30 tablet 0   levocetirizine (XYZAL) 5 MG tablet Take 1 tablet (5 mg total) by mouth daily as needed for allergies. 90 tablet 3   LINZESS 145 MCG CAPS capsule Take 1 capsule (145 mcg total) by mouth daily before breakfast.     metoprolol tartrate (LOPRESSOR) 100 MG tablet TAKE ONE TABLET BY MOUTH TWICE DAILY @9AM -5PM 180 tablet 1   niacin (VITAMIN B3) 500 MG ER tablet Take 1 tablet (500 mg total)  by mouth daily. 90 tablet 3   polyethylene glycol powder (GLYCOLAX/MIRALAX) 17 GM/SCOOP powder Take 17 g by mouth daily as needed for constipation.     pravastatin (PRAVACHOL) 40 MG tablet TAKE ONE TABLET BY MOUTH DAILY AT 5PM (VIAL) 90 tablet 3   sertraline (ZOLOFT) 100 MG tablet TAKE ONE TABLET BY MOUTH DAILY AT 9AM IN ADDITION TO 50 MG TABLET (VIAL) 90 tablet 3   sertraline (ZOLOFT) 50 MG tablet TAKE ONE TABLET BY MOUTH DAILY AT 5PM EVERY EVENING 90 tablet 3   valsartan (DIOVAN) 320 MG tablet TAKE ONE TABLET BY MOUTH DAILY AT 9AM (VIAL) 100 tablet 2   zolpidem (AMBIEN CR) 12.5 MG CR tablet Take 1 tablet (12.5 mg total) by mouth at bedtime. 30 tablet 5   No facility-administered medications prior to visit.   Past Medical History:  Diagnosis Date   Abdominal pain 10/11/2012   Anemia 05/31/2011   Angina    Anxiety    Arthritis    "knees" (07/20/2013)   Atypical chest pain 05/31/2011   Carotid stenosis, asymptomatic 04/18/2014   Claudication (HCC)    COVID-19 virus infection 08/01/2018   Depression    GERD (gastroesophageal reflux disease)    Hematoma  of neck 04/23/2014   High cholesterol    Hyperlipemia 05/31/2011   Hypokalemia 05/31/2011   Leucocytosis 05/31/2011   Myocardial infarction [facility]) 1990's   "1"   Neck pain on right side 10/04/2014   PAD (peripheral artery disease) (HCC)    Peripheral arterial disease (HCC) 06/08/2013   Peripheral arterial disease   Radiculopathy of leg 10/11/2012   Stroke [facility])    "mini stroke 1st then regular stroke", denies residual on 07/20/2013   Wears glasses    Past Surgical History:  Procedure Laterality Date   ABDOMINAL HYSTERECTOMY     ATHERECTOMY  10/05/2013   BALLOON ANGIOPLASTY, ARTERY  10/05/2013   DR Jacinto Halim   CAROTID ENDARTERECTOMY     CORONARY ANGIOPLASTY WITH STENT PLACEMENT     "1"   ENDARTERECTOMY Left 02/07/2014   Procedure: ENDARTERECTOMY CAROTID;  Surgeon: Sherren Kerns, MD;  Location: [facility] OR;  Service: Vascular;  Laterality: Left;    ENDARTERECTOMY Right 04/18/2014   Procedure: ENDARTERECTOMY RIGHT CAROTID;  Surgeon: Sherren Kerns, MD;  Location: [facility] OR;  Service: Vascular;  Laterality: Right;   ENDARTERECTOMY N/A 04/24/2014   Procedure: IRRIGATION AND DEBRIDEMENT OF RIGHT NECK ;  Surgeon: Sherren Kerns, MD;  Location: [facility] OR;  Service: Vascular;  Laterality: N/A;   ESOPHAGOGASTRODUODENOSCOPY N/A 10/13/2012   Procedure: ESOPHAGOGASTRODUODENOSCOPY (EGD);  Surgeon: Iva Boop, MD;  Location: [facility] ENDOSCOPY;  Service: Endoscopy;  Laterality: N/A;   KENALOG INJECTION Bilateral 08/22/2016   Procedure: KENALOG INJECTION BILATERAL NECK;  Surgeon: Peggye Form, DO;  Location: [facility];  Service: Plastics;  Laterality: Bilateral;   LOWER EXTREMITY ANGIOGRAM  07/20/2013   Unsuccessful attempt at crossing the CTO/notes 07/20/2013   LOWER EXTREMITY ANGIOGRAM N/A 07/06/2013   Procedure: LOWER EXTREMITY ANGIOGRAM;  Surgeon: Pamella Pert, MD;  Location: [facility] CATH LAB;  Service: Cardiovascular;  Laterality: N/A;   LOWER EXTREMITY ANGIOGRAM N/A 07/20/2013   Procedure: LOWER EXTREMITY ANGIOGRAM;  Surgeon: Pamella Pert, MD;  Location: [facility] CATH LAB;  Service: Cardiovascular;  Laterality: N/A;   LOWER EXTREMITY ANGIOGRAM N/A 10/05/2013   Procedure: LOWER EXTREMITY ANGIOGRAM;  Surgeon: Pamella Pert, MD;  Location: [facility] CATH LAB;  Service: Cardiovascular;  Laterality: N/A;   MASS EXCISION Right 08/22/2016   Procedure: EXCISION RIGHT NECK KELOID;  Surgeon: Peggye Form, DO;  Location: [facility];  Service: Plastics;  Laterality: Right;   MASS EXCISION Right 09/18/2020   Procedure: Excision of right neck keloid;  Surgeon: Peggye Form, DO;  Location: [facility] OR;  Service: Plastics;  Laterality: Right;   PATCH ANGIOPLASTY Left 02/07/2014   Procedure: PATCH ANGIOPLASTY Carotid;  Surgeon: Sherren Kerns, MD;  Location: [facility] OR;  Service: Vascular;  Laterality: Left;   PATCH ANGIOPLASTY Right 04/18/2014    Procedure: PATCH ANGIOPLASTY USING HEMASHIELD 0.8cmx 7.6cm PATCH;  Surgeon: Sherren Kerns, MD;  Location: [facility] OR;  Service: Vascular;  Laterality: Right;   PERIPHERAL VASCULAR CATHETERIZATION N/A 07/05/2014   Procedure: Lower Extremity Angiography;  Surgeon: Yates Decamp, MD;  Location: [facility] INVASIVE CV LAB;  Service: Cardiovascular;  Laterality: N/A;   TUBAL LIGATION     No Known Allergies    Objective:    Physical Exam Vitals and nursing note reviewed.  Constitutional:      Appearance: Normal appearance.  Cardiovascular:     Rate and Rhythm: Normal rate and regular rhythm.  Pulmonary:     Effort: Pulmonary effort is normal.     Breath sounds:  Normal breath sounds.  Musculoskeletal:        General: Normal range of motion.  Skin:    General: Skin is warm and dry.  Neurological:     Mental Status: She is alert.  Psychiatric:        Mood and Affect: Mood normal.        Behavior: Behavior normal.    There were no vitals taken for this visit. Wt Readings from Last 3 Encounters:  03/11/23 143 lb 4.8 oz (65 kg)  01/01/23 142 lb 8 oz (64.6 kg)  10/22/22 142 lb 6 oz (64.6 kg)       Dulce Sellar, NP

## 2023-04-23 ENCOUNTER — Ambulatory Visit (INDEPENDENT_AMBULATORY_CARE_PROVIDER_SITE_OTHER): Admitting: Family

## 2023-04-23 ENCOUNTER — Encounter: Payer: Self-pay | Admitting: Family

## 2023-04-23 VITALS — BP 160/88 | HR 65 | Temp 97.3°F | Ht 59.0 in | Wt 142.6 lb

## 2023-04-23 DIAGNOSIS — G894 Chronic pain syndrome: Secondary | ICD-10-CM

## 2023-04-23 DIAGNOSIS — N6321 Unspecified lump in the left breast, upper outer quadrant: Secondary | ICD-10-CM

## 2023-04-23 DIAGNOSIS — G4700 Insomnia, unspecified: Secondary | ICD-10-CM | POA: Diagnosis not present

## 2023-04-23 DIAGNOSIS — M1711 Unilateral primary osteoarthritis, right knee: Secondary | ICD-10-CM

## 2023-04-23 DIAGNOSIS — I152 Hypertension secondary to endocrine disorders: Secondary | ICD-10-CM

## 2023-04-23 HISTORY — DX: Unspecified lump in the left breast, upper outer quadrant: N63.21

## 2023-04-23 MED ORDER — HYDROCODONE-ACETAMINOPHEN 5-325 MG PO TABS: 1.0000 | ORAL_TABLET | Freq: Every day | ORAL | 0 refills | Status: DC | PRN

## 2023-04-23 MED ORDER — ZOLPIDEM TARTRATE ER 12.5 MG PO TBCR: 12.5000 mg | EXTENDED_RELEASE_TABLET | Freq: Every day | ORAL | 5 refills | Status: DC

## 2023-04-23 NOTE — Progress Notes (Unsigned)
 Patient ID: Joanne Morris, female    DOB: 08-Nov-1950, 73 y.o.   MRN: [id number]  Chief Complaint  Patient presents with  . Anxiety   HPI: Pain:  She reports chronic right side pain. was not an injury that may have caused the pain. The pain started about a year ago and is staying constant. The pain does not radiate. The pain is described as aching and soreness, is moderate in intensity, occurring intermittently. Symptoms are worse in the: evening  Aggravating factors: standing and walking She has tried application of heat, application of ice, NSAIDs, and prescription pain relievers with moderate relief.  Taking Vicodin 5-325mg  qd w/some relief. States she feels the pain more when her anxiety is high. INSOMNIA:  How long: years. Difficulty initiating sleep: yes.  Difficulty maintaining sleep: yes.  OTC meds tried: Benadryl, Melatonin. RX meds in past: Trazodone.  Sleep hygiene measures: yes. Started any new meds recently: no. Shift worker: no. New stressors: no. Currently taking Zolpidem qhs, working well, denies any SE.  Breast Lump:      Assessment & Plan:  Chronic pain syndrome -     HYDROcodone-Acetaminophen; Take 1 tablet by mouth daily as needed for moderate pain (pain score 4-6).  Dispense: 30 tablet; Refill: 0   Subjective:    Outpatient Medications Prior to Visit  Medication Sig Dispense Refill  . acetaminophen (TYLENOL) 650 MG CR tablet Take 1 tablet (650 mg total) by mouth every 8 (eight) hours as needed for pain. 90 tablet 5  . amLODipine (NORVASC) 10 MG tablet TAKE ONE TABLET BY MOUTH DAILY AT 9AM (VIAL) 90 tablet 3  . aspirin EC 81 MG tablet Take 81 mg by mouth daily.    . busPIRone (BUSPAR) 5 MG tablet TAKE ONE TABLET (5MG  TOTAL) BY MOUTH TWICE DAILY 60 tablet 5  . CALCIUM-VITAMIN D PO Take 1 tablet by mouth daily.    . cilostazol (PLETAL) 100 MG tablet Take 1 tablet (100 mg total) by mouth 2 (two) times daily. 180 tablet 3  . dapagliflozin propanediol (FARXIGA) 10 MG TABS  tablet Take 1 tablet (10 mg total) by mouth in the morning. 90 tablet 3  . diclofenac Sodium (VOLTAREN) 1 % GEL APPLY 2 GRAMS TOPICALLY FOUR TIMES DAILY 100 g 3  . diphenoxylate-atropine (LOMOTIL) 2.5-0.025 MG tablet Take 1 tablet by mouth 4 (four) times daily as needed for diarrhea or loose stools (Max dose of 8 tabs in 24 hours). 30 tablet 1  . glipiZIDE (GLUCOTROL XL) 5 MG 24 hr tablet Take 1 tablet (5 mg total) by mouth daily with breakfast. 90 tablet 3  . levocetirizine (XYZAL) 5 MG tablet Take 1 tablet (5 mg total) by mouth daily as needed for allergies. 90 tablet 3  . LINZESS 145 MCG CAPS capsule Take 1 capsule (145 mcg total) by mouth daily before breakfast.    . metoprolol tartrate (LOPRESSOR) 100 MG tablet TAKE ONE TABLET BY MOUTH TWICE DAILY @9AM -5PM 180 tablet 1  . niacin (VITAMIN B3) 500 MG ER tablet Take 1 tablet (500 mg total) by mouth daily. 90 tablet 3  . polyethylene glycol powder (GLYCOLAX/MIRALAX) 17 GM/SCOOP powder Take 17 g by mouth daily as needed for constipation.    . pravastatin (PRAVACHOL) 40 MG tablet TAKE ONE TABLET BY MOUTH DAILY AT 5PM (VIAL) 90 tablet 3  . sertraline (ZOLOFT) 100 MG tablet TAKE ONE TABLET BY MOUTH DAILY AT 9AM IN ADDITION TO 50 MG TABLET (VIAL) 90 tablet 3  . sertraline (ZOLOFT)  50 MG tablet TAKE ONE TABLET BY MOUTH DAILY AT 5PM EVERY EVENING 90 tablet 3  . valsartan (DIOVAN) 320 MG tablet TAKE ONE TABLET BY MOUTH DAILY AT 9AM (VIAL) 100 tablet 2  . zolpidem (AMBIEN CR) 12.5 MG CR tablet Take 1 tablet (12.5 mg total) by mouth at bedtime. 30 tablet 5  . HYDROcodone-acetaminophen (NORCO/VICODIN) 5-325 MG tablet Take 1 tablet by mouth daily as needed for moderate pain (pain score 4-6). 30 tablet 0  . hydrALAZINE (APRESOLINE) 50 MG tablet Take 1 tablet (50 mg total) by mouth 3 (three) times daily. 270 tablet 3   No facility-administered medications prior to visit.   Past Medical History:  Diagnosis Date  . Abdominal pain 10/11/2012  . Anemia 05/31/2011   . Angina   . Anxiety   . Arthritis    "knees" (07/20/2013)  . Atypical chest pain 05/31/2011  . Carotid stenosis, asymptomatic 04/18/2014  . Claudication (HCC)   . COVID-19 virus infection 08/01/2018  . Depression   . GERD (gastroesophageal reflux disease)   . Hematoma of neck 04/23/2014  . High cholesterol   . Hyperlipemia 05/31/2011  . Hypokalemia 05/31/2011  . Leucocytosis 05/31/2011  . Myocardial infarction (HCC) 1990's   "1"  . Neck pain on right side 10/04/2014  . PAD (peripheral artery disease) (HCC)   . Peripheral arterial disease (HCC) 06/08/2013   Peripheral arterial disease  . Radiculopathy of leg 10/11/2012  . Stroke [facility])    "mini stroke 1st then regular stroke", denies residual on 07/20/2013  . Wears glasses    Past Surgical History:  Procedure Laterality Date  . ABDOMINAL HYSTERECTOMY    . ATHERECTOMY  10/05/2013  . BALLOON ANGIOPLASTY, ARTERY  10/05/2013   DR Jacinto Halim  . CAROTID ENDARTERECTOMY    . CORONARY ANGIOPLASTY WITH STENT PLACEMENT     "1"  . ENDARTERECTOMY Left 02/07/2014   Procedure: ENDARTERECTOMY CAROTID;  Surgeon: Sherren Kerns, MD;  Location: [facility] OR;  Service: Vascular;  Laterality: Left;  . ENDARTERECTOMY Right 04/18/2014   Procedure: ENDARTERECTOMY RIGHT CAROTID;  Surgeon: Sherren Kerns, MD;  Location: [facility] OR;  Service: Vascular;  Laterality: Right;  . ENDARTERECTOMY N/A 04/24/2014   Procedure: IRRIGATION AND DEBRIDEMENT OF RIGHT NECK ;  Surgeon: Sherren Kerns, MD;  Location: [facility] OR;  Service: Vascular;  Laterality: N/A;  . ESOPHAGOGASTRODUODENOSCOPY N/A 10/13/2012   Procedure: ESOPHAGOGASTRODUODENOSCOPY (EGD);  Surgeon: Iva Boop, MD;  Location: [facility] ENDOSCOPY;  Service: Endoscopy;  Laterality: N/A;  . KENALOG INJECTION Bilateral 08/22/2016   Procedure: KENALOG INJECTION BILATERAL NECK;  Surgeon: Peggye Form, DO;  Location: [facility];  Service: Plastics;  Laterality: Bilateral;  . LOWER EXTREMITY ANGIOGRAM  07/20/2013    Unsuccessful attempt at crossing the CTO/notes 07/20/2013  . LOWER EXTREMITY ANGIOGRAM N/A 07/06/2013   Procedure: LOWER EXTREMITY ANGIOGRAM;  Surgeon: Pamella Pert, MD;  Location: [facility] CATH LAB;  Service: Cardiovascular;  Laterality: N/A;  . LOWER EXTREMITY ANGIOGRAM N/A 07/20/2013   Procedure: LOWER EXTREMITY ANGIOGRAM;  Surgeon: Pamella Pert, MD;  Location: [facility] CATH LAB;  Service: Cardiovascular;  Laterality: N/A;  . LOWER EXTREMITY ANGIOGRAM N/A 10/05/2013   Procedure: LOWER EXTREMITY ANGIOGRAM;  Surgeon: Pamella Pert, MD;  Location: [facility] CATH LAB;  Service: Cardiovascular;  Laterality: N/A;  . MASS EXCISION Right 08/22/2016   Procedure: EXCISION RIGHT NECK KELOID;  Surgeon: Peggye Form, DO;  Location: [facility];  Service: Plastics;  Laterality: Right;  . MASS EXCISION Right  09/18/2020   Procedure: Excision of right neck keloid;  Surgeon: Peggye Form, DO;  Location: [facility] OR;  Service: Plastics;  Laterality: Right;  . PATCH ANGIOPLASTY Left 02/07/2014   Procedure: PATCH ANGIOPLASTY Carotid;  Surgeon: Sherren Kerns, MD;  Location: [facility] OR;  Service: Vascular;  Laterality: Left;  . PATCH ANGIOPLASTY Right 04/18/2014   Procedure: PATCH ANGIOPLASTY USING HEMASHIELD 0.8cmx 7.6cm PATCH;  Surgeon: Sherren Kerns, MD;  Location: [facility] OR;  Service: Vascular;  Laterality: Right;  . PERIPHERAL VASCULAR CATHETERIZATION N/A 07/05/2014   Procedure: Lower Extremity Angiography;  Surgeon: Yates Decamp, MD;  Location: [facility] INVASIVE CV LAB;  Service: Cardiovascular;  Laterality: N/A;  . TUBAL LIGATION     No Known Allergies    Objective:    Physical Exam Vitals and nursing note reviewed.  Constitutional:      Appearance: Normal appearance.  Cardiovascular:     Rate and Rhythm: Normal rate and regular rhythm.  Pulmonary:     Effort: Pulmonary effort is normal.     Breath sounds: Normal breath sounds.  Musculoskeletal:        General: Normal range of motion.  Skin:     General: Skin is warm and dry.  Neurological:     Mental Status: She is alert.  Psychiatric:        Mood and Affect: Mood normal.        Behavior: Behavior normal.   BP (!) 160/88 (BP Location: Left Arm, Patient Position: Sitting, Cuff Size: Normal)   Pulse 65   Temp (!) 97.3 F (36.3 C) (Temporal)   Ht 4\' 11"  (1.499 m)   Wt 142 lb 9.6 oz (64.7 kg)   SpO2 97%   BMI 28.80 kg/m  Wt Readings from Last 3 Encounters:  04/23/23 142 lb 9.6 oz (64.7 kg)  03/11/23 143 lb 4.8 oz (65 kg)  01/01/23 142 lb 8 oz (64.6 kg)       Dulce Sellar, NP

## 2023-04-25 ENCOUNTER — Encounter: Payer: Self-pay | Admitting: Family

## 2023-04-25 DIAGNOSIS — M1711 Unilateral primary osteoarthritis, right knee: Secondary | ICD-10-CM | POA: Insufficient documentation

## 2023-04-25 MED ORDER — HYDROCODONE-ACETAMINOPHEN 5-325 MG PO TABS: 1.0000 | ORAL_TABLET | Freq: Every day | ORAL | 0 refills | Status: DC | PRN

## 2023-04-25 NOTE — Assessment & Plan Note (Signed)
 Blood pressure elevated at 158/84. Possible high salt intake. Advised increased water intake and reduced salt consumption. - Encourage increased water intake and reduced salt consumption. - Monitor blood pressure regularly. - Ensure adherence to antihypertensive medication regimen. -F/U in 4 mos

## 2023-04-25 NOTE — Assessment & Plan Note (Signed)
 Non-tender left breast lump for two weeks. 2 pea size lumps palpated below an area of circular discoloration, approx 2cm in diameter. Diagram in note. Differential includes benign causes; malignancy must be ruled out. Further imaging required. - Order mammogram with ultrasound at [facility] radiology in [place].

## 2023-04-25 NOTE — Assessment & Plan Note (Signed)
 On hydrocodone for chronic side pain. Prescription management necessary. - Send hydrocodone prescription to [facility] with RX to hold on file. - Monitor pain levels and adjust medication as needed.

## 2023-04-25 NOTE — Assessment & Plan Note (Signed)
 Hoping recent injection more effective than cortisone. Coordination with insurance ongoing. Monitoring relief duration is crucial. - Continue therapy as recommended by orthopedic specialist. - Monitor effectiveness and duration of relief. - Coordinate with insurance for coverage. -Will continue to monitor

## 2023-04-28 ENCOUNTER — Telehealth: Payer: Self-pay

## 2023-04-28 ENCOUNTER — Other Ambulatory Visit: Payer: Self-pay

## 2023-04-28 DIAGNOSIS — N6321 Unspecified lump in the left breast, upper outer quadrant: Secondary | ICD-10-CM

## 2023-04-28 NOTE — Telephone Encounter (Signed)
 Copied from CRM [id number]. Topic: Clinical - Request for Lab/Test Order >> Apr 28, 2023  9:49 AM Denese Killings wrote: Reason for CRM: Lauren with [facility] is requesting that a diagnostic mammogram of the left breast and an ultrasound of the left breast order faxed to [phone].    Orders were faxed over this morning.

## 2023-04-29 NOTE — Telephone Encounter (Signed)
 Joanne Morris with [facility] states the fax for the following order has not been received: ultrasound of the left breast.(Confirmed Order for Diagnostic Mammogram has been received)  Requests Ultrasound Order of the left breast be faxed to [phone]

## 2023-04-30 DIAGNOSIS — M1711 Unilateral primary osteoarthritis, right knee: Secondary | ICD-10-CM | POA: Diagnosis not present

## 2023-04-30 NOTE — Addendum Note (Signed)
 Addended by: Candie Chroman on: 04/30/2023 08:10 AM   Modules accepted: Orders

## 2023-04-30 NOTE — Telephone Encounter (Signed)
 Ultrasound Order of the left breast has been faxed to [phone]

## 2023-05-07 DIAGNOSIS — M1711 Unilateral primary osteoarthritis, right knee: Secondary | ICD-10-CM | POA: Diagnosis not present

## 2023-05-14 DIAGNOSIS — M1711 Unilateral primary osteoarthritis, right knee: Secondary | ICD-10-CM | POA: Diagnosis not present

## 2023-05-20 ENCOUNTER — Institutional Professional Consult (permissible substitution): Payer: 59 | Admitting: Plastic Surgery

## 2023-05-22 ENCOUNTER — Other Ambulatory Visit: Payer: Self-pay | Admitting: Family

## 2023-05-22 DIAGNOSIS — I10 Essential (primary) hypertension: Secondary | ICD-10-CM

## 2023-05-22 NOTE — Telephone Encounter (Unsigned)
 Copied from CRM [id number]. Topic: Clinical - Medication Refill >> May 22, 2023  5:33 PM Denese Killings wrote: Most Recent Primary Care Visit:  Provider: Dulce Sellar  Department: [facility]  Visit Type: OFFICE VISIT  Date: 04/23/2023  Medication: metoprolol tartrate (LOPRESSOR) 100 MG tablet  Has the patient contacted their pharmacy? Yes (Agent: If no, request that the patient contact the pharmacy for the refill. If patient does not wish to contact the pharmacy document the reason why and proceed with request.) (Agent: If yes, when and what did the pharmacy advise?)  Is this the correct pharmacy for this prescription? Yes If no, delete pharmacy and type the correct one.  This is the patient's preferred pharmacy:   [facility] [place] [address] [place] [address] Phone: [phone] Fax: [phone]   Has the prescription been filled recently? N/A  Is the patient out of the medication? No few days left   Has the patient been seen for an appointment in the last year OR does the patient have an upcoming appointment? Yes  Can we respond through MyChart? Yes  Agent: Please be advised that Rx refills may take up to 3 business days. We ask that you follow-up with your pharmacy.

## 2023-05-23 MED ORDER — METOPROLOL TARTRATE 100 MG PO TABS: ORAL_TABLET | ORAL | 1 refills | Status: DC

## 2023-05-26 ENCOUNTER — Telehealth: Payer: Self-pay

## 2023-05-26 NOTE — Telephone Encounter (Signed)
 Copied from CRM [id number]. Topic: Clinical - Medication Question >> May 26, 2023  3:46 PM Joanne Morris wrote: Reason for CRM: Patient called in regarding prescription HYDROcodone-acetaminophen (NORCO/VICODIN) 5-325 MG tablet , stated her nurse just left and stated she should call her dr to see if they can up the prescription to 7.5   [phone]   Pt needs office visit to increase dose. Please schedule.  Thanks!

## 2023-05-27 DIAGNOSIS — G8929 Other chronic pain: Secondary | ICD-10-CM | POA: Diagnosis not present

## 2023-05-27 DIAGNOSIS — M25561 Pain in right knee: Secondary | ICD-10-CM | POA: Diagnosis not present

## 2023-05-28 ENCOUNTER — Encounter: Payer: Self-pay | Admitting: Family

## 2023-05-28 ENCOUNTER — Ambulatory Visit (INDEPENDENT_AMBULATORY_CARE_PROVIDER_SITE_OTHER): Admitting: Family

## 2023-05-28 VITALS — BP 143/73 | HR 73 | Temp 97.0°F | Ht 59.0 in | Wt 143.4 lb

## 2023-05-28 DIAGNOSIS — G894 Chronic pain syndrome: Secondary | ICD-10-CM | POA: Diagnosis not present

## 2023-05-28 MED ORDER — HYDROCODONE-ACETAMINOPHEN 7.5-300 MG PO TABS: 1.0000 | ORAL_TABLET | Freq: Every day | ORAL | 0 refills | Status: DC | PRN

## 2023-05-28 MED ORDER — ACETAMINOPHEN ER 650 MG PO TBCR
650.0000 mg | EXTENDED_RELEASE_TABLET | Freq: Every evening | ORAL | 11 refills | Status: AC
Start: 2023-05-28 — End: ?

## 2023-05-28 NOTE — Progress Notes (Signed)
 Patient ID: Joanne Morris, female    DOB: October 17, 1950, 73 y.o.   MRN: [id number]  Chief Complaint  Patient presents with   Chronic pain syndrome    Pt would liked to discuss increasing dose.  Discussed the use of AI scribe software for clinical note transcription with the patient, who gave verbal consent to proceed.  History of Present Illness The patient, with a history of chronic knee pain, reports improvement after receiving a second cortisone injection. However, she continues to experience pain, primarily in the knee, and occasionally in the back. She is currently taking hydrocodone 5mg  for pain management, but a nurse suggested that a higher dose (7.5mg ) might provide better relief. The patient is not currently taking Tylenol. The patient takes her medications directly from the bottle, rather than using a pill planner. She also uses diclofenac gel for pain relief, applying it several times a day. The patient's blood pressure was slightly elevated, possibly due to the recent cortisone injection. She is also dealing with keloids, which have not been treated yet.  Assessment & Plan Chronic pain of multiple joints - Improvement after second injection. Requested hydrocodone increase to 7.5 mg. Cautious about side effects. Not using acetaminophen, recommended for additional relief. - Increase hydrocodone to 7.5 mg qd prn. - Prescribe long-acting acetaminophen 650mg  qevening  (arthritis strength). - Continue diclofenac gel tid as needed. - F/U in 4 mos  Intermittent back pain Long-acting acetaminophen recommended due to limited NSAID use. Diclofenac gel advised for localized relief. - Prescribe long-acting acetaminophen (arthritis strength). - Continue diclofenac gel as needed.  Hypertension Slightly elevated BP, possibly from steroid injection. Advised on salt intake and hydration. - Advise to avoid adding salt to food. - Encourage increased water intake.  Keloids Previously untreated,  referred to specialist but no follow-up. Interested in future treatment. - Encourage follow-up with a specialist.     Subjective:    Outpatient Medications Prior to Visit  Medication Sig Dispense Refill   acetaminophen (TYLENOL) 650 MG CR tablet Take 1 tablet (650 mg total) by mouth every 8 (eight) hours as needed for pain. 90 tablet 5   amLODipine (NORVASC) 10 MG tablet TAKE ONE TABLET BY MOUTH DAILY AT 9AM (VIAL) 90 tablet 3   aspirin EC 81 MG tablet Take 81 mg by mouth daily.     busPIRone (BUSPAR) 5 MG tablet TAKE ONE TABLET (5MG  TOTAL) BY MOUTH TWICE DAILY 60 tablet 5   CALCIUM-VITAMIN D PO Take 1 tablet by mouth daily.     cilostazol (PLETAL) 100 MG tablet Take 1 tablet (100 mg total) by mouth 2 (two) times daily. 180 tablet 3   dapagliflozin propanediol (FARXIGA) 10 MG TABS tablet Take 1 tablet (10 mg total) by mouth in the morning. 90 tablet 3   diclofenac Sodium (VOLTAREN) 1 % GEL APPLY 2 GRAMS TOPICALLY FOUR TIMES DAILY 100 g 3   diphenoxylate-atropine (LOMOTIL) 2.5-0.025 MG tablet Take 1 tablet by mouth 4 (four) times daily as needed for diarrhea or loose stools (Max dose of 8 tabs in 24 hours). 30 tablet 1   glipiZIDE (GLUCOTROL XL) 5 MG 24 hr tablet Take 1 tablet (5 mg total) by mouth daily with breakfast. 90 tablet 3   [START ON 05/31/2023] HYDROcodone-acetaminophen (NORCO/VICODIN) 5-325 MG tablet Take 1 tablet by mouth daily as needed for moderate pain (pain score 4-6). 30 tablet 0   levocetirizine (XYZAL) 5 MG tablet Take 1 tablet (5 mg total) by mouth daily as needed for allergies. 90  tablet 3   LINZESS 145 MCG CAPS capsule Take 1 capsule (145 mcg total) by mouth daily before breakfast.     metoprolol tartrate (LOPRESSOR) 100 MG tablet TAKE ONE TABLET BY MOUTH TWICE DAILY @9AM -5PM 180 tablet 1   niacin (VITAMIN B3) 500 MG ER tablet Take 1 tablet (500 mg total) by mouth daily. 90 tablet 3   polyethylene glycol powder (GLYCOLAX/MIRALAX) 17 GM/SCOOP powder Take 17 g by mouth daily  as needed for constipation.     pravastatin (PRAVACHOL) 40 MG tablet TAKE ONE TABLET BY MOUTH DAILY AT 5PM (VIAL) 90 tablet 3   sertraline (ZOLOFT) 100 MG tablet TAKE ONE TABLET BY MOUTH DAILY AT 9AM IN ADDITION TO 50 MG TABLET (VIAL) 90 tablet 3   sertraline (ZOLOFT) 50 MG tablet TAKE ONE TABLET BY MOUTH DAILY AT 5PM EVERY EVENING 90 tablet 3   valsartan (DIOVAN) 320 MG tablet TAKE ONE TABLET BY MOUTH DAILY AT 9AM (VIAL) 100 tablet 2   zolpidem (AMBIEN CR) 12.5 MG CR tablet Take 1 tablet (12.5 mg total) by mouth at bedtime. 30 tablet 5   hydrALAZINE (APRESOLINE) 50 MG tablet Take 1 tablet (50 mg total) by mouth 3 (three) times daily. 270 tablet 3   No facility-administered medications prior to visit.   Past Medical History:  Diagnosis Date   Abdominal pain 10/11/2012   Acute kidney injury superimposed on CKD (HCC) 08/01/2018   Anemia 05/31/2011   Angina    Anxiety    Arthritis    "knees" (07/20/2013)   Atypical chest pain 05/31/2011   Carotid stenosis, asymptomatic 04/18/2014   Claudication (HCC)    COVID-19 virus infection 08/01/2018   Depression    GERD (gastroesophageal reflux disease)    Hematoma of neck 04/23/2014   High cholesterol    Hyperlipemia 05/31/2011   Hypokalemia 05/31/2011   Leucocytosis 05/31/2011   Myocardial infarction [facility]) 1990's   "1"   Neck pain on right side 10/04/2014   PAD (peripheral artery disease) (HCC)    Peripheral arterial disease (HCC) 06/08/2013   Peripheral arterial disease   Radiculopathy of leg 10/11/2012   Stroke [facility])    "mini stroke 1st then regular stroke", denies residual on 07/20/2013   Wears glasses    Past Surgical History:  Procedure Laterality Date   ABDOMINAL HYSTERECTOMY     ATHERECTOMY  10/05/2013   BALLOON ANGIOPLASTY, ARTERY  10/05/2013   DR Jacinto Halim   CAROTID ENDARTERECTOMY     CORONARY ANGIOPLASTY WITH STENT PLACEMENT     "1"   ENDARTERECTOMY Left 02/07/2014   Procedure: ENDARTERECTOMY CAROTID;  Surgeon: Sherren Kerns, MD;  Location: [facility] OR;  Service: Vascular;  Laterality: Left;   ENDARTERECTOMY Right 04/18/2014   Procedure: ENDARTERECTOMY RIGHT CAROTID;  Surgeon: Sherren Kerns, MD;  Location: [facility] OR;  Service: Vascular;  Laterality: Right;   ENDARTERECTOMY N/A 04/24/2014   Procedure: IRRIGATION AND DEBRIDEMENT OF RIGHT NECK ;  Surgeon: Sherren Kerns, MD;  Location: [facility] OR;  Service: Vascular;  Laterality: N/A;   ESOPHAGOGASTRODUODENOSCOPY N/A 10/13/2012   Procedure: ESOPHAGOGASTRODUODENOSCOPY (EGD);  Surgeon: Iva Boop, MD;  Location: [facility] ENDOSCOPY;  Service: Endoscopy;  Laterality: N/A;   KENALOG INJECTION Bilateral 08/22/2016   Procedure: KENALOG INJECTION BILATERAL NECK;  Surgeon: Peggye Form, DO;  Location: [facility];  Service: Plastics;  Laterality: Bilateral;   LOWER EXTREMITY ANGIOGRAM  07/20/2013   Unsuccessful attempt at crossing the CTO/notes 07/20/2013   LOWER EXTREMITY ANGIOGRAM N/A 07/06/2013  Procedure: LOWER EXTREMITY ANGIOGRAM;  Surgeon: Jessica Morn, MD;  Location: [facility] CATH LAB;  Service: Cardiovascular;  Laterality: N/A;   LOWER EXTREMITY ANGIOGRAM N/A 07/20/2013   Procedure: LOWER EXTREMITY ANGIOGRAM;  Surgeon: Jessica Morn, MD;  Location: [facility] CATH LAB;  Service: Cardiovascular;  Laterality: N/A;   LOWER EXTREMITY ANGIOGRAM N/A 10/05/2013   Procedure: LOWER EXTREMITY ANGIOGRAM;  Surgeon: Jessica Morn, MD;  Location: [facility] CATH LAB;  Service: Cardiovascular;  Laterality: N/A;   MASS EXCISION Right 08/22/2016   Procedure: EXCISION RIGHT NECK KELOID;  Surgeon: Thornell Flirt, DO;  Location: [facility];  Service: Plastics;  Laterality: Right;   MASS EXCISION Right 09/18/2020   Procedure: Excision of right neck keloid;  Surgeon: Thornell Flirt, DO;  Location: [facility] OR;  Service: Plastics;  Laterality: Right;   PATCH ANGIOPLASTY Left 02/07/2014   Procedure: PATCH ANGIOPLASTY Carotid;  Surgeon: Richrd Char, MD;  Location: [facility] OR;   Service: Vascular;  Laterality: Left;   PATCH ANGIOPLASTY Right 04/18/2014   Procedure: PATCH ANGIOPLASTY USING HEMASHIELD 0.8cmx 7.6cm PATCH;  Surgeon: Richrd Char, MD;  Location: [facility] OR;  Service: Vascular;  Laterality: Right;   PERIPHERAL VASCULAR CATHETERIZATION N/A 07/05/2014   Procedure: Lower Extremity Angiography;  Surgeon: Knox Perl, MD;  Location: [facility] INVASIVE CV LAB;  Service: Cardiovascular;  Laterality: N/A;   TUBAL LIGATION     No Known Allergies    Objective:    Physical Exam Vitals and nursing note reviewed.  Constitutional:      Appearance: Normal appearance.  Cardiovascular:     Rate and Rhythm: Normal rate and regular rhythm.  Pulmonary:     Effort: Pulmonary effort is normal.     Breath sounds: Normal breath sounds.  Musculoskeletal:        General: Normal range of motion.  Skin:    General: Skin is warm and dry.  Neurological:     Mental Status: She is alert.  Psychiatric:        Mood and Affect: Mood normal.        Behavior: Behavior normal.   BP (!) 143/73 (BP Location: Left Arm, Patient Position: Sitting, Cuff Size: Large)   Pulse 73   Temp (!) 97 F (36.1 C) (Temporal)   Ht 4\' 11"  (1.499 m)   Wt 143 lb 6 oz (65 kg)   SpO2 98%   BMI 28.96 kg/m  Wt Readings from Last 3 Encounters:  05/28/23 143 lb 6 oz (65 kg)  04/23/23 142 lb 9.6 oz (64.7 kg)  03/11/23 143 lb 4.8 oz (65 kg)      Versa Gore, NP

## 2023-05-28 NOTE — Assessment & Plan Note (Signed)
 Improvement after second injection. Requested hydrocodone increase to 7.5 mg. Cautious about side effects. Not using acetaminophen, recommended for additional relief. - Increase hydrocodone to 7.5 mg qd prn. - Prescribe long-acting acetaminophen 650mg  qevening  (arthritis strength). - Continue diclofenac gel tid as needed. - F/U in 4 mos

## 2023-05-29 ENCOUNTER — Other Ambulatory Visit: Payer: Self-pay | Admitting: Family

## 2023-05-29 NOTE — Telephone Encounter (Signed)
 Copied from CRM [id number]. Topic: Clinical - Medication Refill >> May 29, 2023 12:31 PM Palma Bob wrote: Most Recent Primary Care Visit:  Provider: Versa Gore  Department: [facility]  Visit Type: OFFICE VISIT  Date: 05/28/2023  Medication: HYDROcodone-Acetaminophen 7.5-300 MG TABS, patient states the pharmacy called her to let her know they only have 7.5-325MG  tabs , she was advised that they don't carry 7.2-300MG  anymore.   Has the patient contacted their pharmacy? Yes (Agent: If no, request that the patient contact the pharmacy for the refill. If patient does not wish to contact the pharmacy document the reason why and proceed with request.) (Agent: If yes, when and what did the pharmacy advise?)  Is this the correct pharmacy for this prescription? Yes If no, delete pharmacy and type the correct one.  This is the patient's preferred pharmacy:  [id number] - [place], [place] - [address] AT   Has the prescription been filled recently? No  Is the patient out of the medication? Yes  Has the patient been seen for an appointment in the last year OR does the patient have an upcoming appointment? Yes  Can we respond through MyChart? Yes  Agent: Please be advised that Rx refills may take up to 3 business days. We ask that you follow-up with your pharmacy.

## 2023-05-30 ENCOUNTER — Other Ambulatory Visit: Payer: Self-pay | Admitting: Family

## 2023-05-30 DIAGNOSIS — G894 Chronic pain syndrome: Secondary | ICD-10-CM

## 2023-05-30 NOTE — Telephone Encounter (Signed)
 Copied from CRM [id number]. Topic: Clinical - Prescription Issue >> May 30, 2023  2:48 PM China J wrote: Reason for CRM: [facility] does not have the patient's HYDROcodone . Patient is asking if the refill could be sent to [facility] instead:   [facility] [address] [place] - [address] [place] [address] Phone: [phone] Fax: [phone]

## 2023-06-02 ENCOUNTER — Telehealth: Payer: Self-pay

## 2023-06-02 NOTE — Telephone Encounter (Signed)
 Copied from CRM [id number]. Topic: General - Other >> Jun 02, 2023  3:55 PM Howard Macho wrote: Reason for CRM: patient called checking on her medication refill for hydrocodone    Returned pt's call.

## 2023-06-02 NOTE — Telephone Encounter (Signed)
 Copied from CRM [id number]. Topic: Clinical - Prescription Issue >> Jun 02, 2023  1:30 PM Albertha Alosa wrote: Reason for CRM: Patient called in regarding her prescription HYDROcodone -Acetaminophen  7.5-300 MG TABS , stated she has called the pharmacy and they still have not received it would like for someone to give her a callback regarding this   I returned pts call to let pt know RX was sent and pharmacist is filling for her now. Pt verbalized understanding.

## 2023-06-03 NOTE — Telephone Encounter (Signed)
 This was taken care of

## 2023-06-06 ENCOUNTER — Telehealth: Payer: Self-pay

## 2023-06-06 ENCOUNTER — Other Ambulatory Visit: Payer: Self-pay

## 2023-06-06 DIAGNOSIS — E1122 Type 2 diabetes mellitus with diabetic chronic kidney disease: Secondary | ICD-10-CM

## 2023-06-06 DIAGNOSIS — G8929 Other chronic pain: Secondary | ICD-10-CM

## 2023-06-06 NOTE — Telephone Encounter (Signed)
 Copied from CRM [id number]. Topic: General - Other >> Jun 06, 2023  1:43 PM Freya Jesus wrote: Reason for CRM: Patient calling to request provider or nurse send a request to [facility] for Life Alert chain.   Order has been placed and faxed to [facility].

## 2023-06-11 ENCOUNTER — Telehealth: Payer: Self-pay

## 2023-06-11 NOTE — Telephone Encounter (Signed)
 Copied from CRM [id number]. Topic: Clinical - Prescription Issue >> Jun 10, 2023  2:01 PM Marlan Silva wrote: Reason for CRM: Patient calling to request provider or nurse send a request to [facility] for Life Alert chain. She wanted to know if the provider could write a rx for it. Patient is requesting a call back.    I returned pt's call letting her know this has already been faxed. Advised pt to call back if she has any questions.

## 2023-06-13 ENCOUNTER — Other Ambulatory Visit (HOSPITAL_COMMUNITY): Payer: Self-pay

## 2023-06-18 ENCOUNTER — Telehealth: Payer: Self-pay

## 2023-06-18 ENCOUNTER — Telehealth: Payer: Self-pay | Admitting: Family

## 2023-06-18 DIAGNOSIS — L910 Hypertrophic scar: Secondary | ICD-10-CM

## 2023-06-18 DIAGNOSIS — G4700 Insomnia, unspecified: Secondary | ICD-10-CM

## 2023-06-18 NOTE — Telephone Encounter (Signed)
 Copied from CRM [id number]. Topic: Clinical - Medical Advice >> Jun 18, 2023 12:26 PM Joanne Morris wrote: Reason for CRM: Pt would like to know if she can get a life alert because she lives alone. She believes we may have to call the insurance company to get this approved.   Will re-fax order.

## 2023-06-18 NOTE — Addendum Note (Signed)
 Addended by: Lauri Poot on: 06/18/2023 03:46 PM   Modules accepted: Orders

## 2023-06-18 NOTE — Telephone Encounter (Signed)
 Last Fill: 04/23/23 30 tabs/5 RF  This RN attempted to reach pt to advise refills available at pharmacy. "Call cannot be completed as dialed"

## 2023-06-18 NOTE — Telephone Encounter (Signed)
 ok to send referral to [facility] plastic surgery - DX keloids on neck

## 2023-06-18 NOTE — Telephone Encounter (Signed)
 Copied from CRM [id number]. Topic: Referral - Request for Referral >> Jun 17, 2023  4:09 PM Magdalene School wrote: Did the patient discuss referral with their provider in the last year? Yes (If No - schedule appointment) (If Yes - send message)  Appointment offered? Yes  Type of order/referral and detailed reason for visit: Referral to plastic surgery to get mass on neck removed.  Preference of office, provider, location: No preference.  If referral order, have you been seen by this specialty before? No (If Yes, this issue or another issue? When? Where?  Can we respond through MyChart? No

## 2023-06-18 NOTE — Telephone Encounter (Signed)
 Copied from CRM [id number]. Topic: Referral - Request for Referral >> Jun 18, 2023 12:24 PM Joanne Morris wrote: Did the patient discuss referral with their provider in the last year? No (If No - schedule appointment) (If Yes - send message)  Appointment offered? No  Type of order/referral and detailed reason for visit: Pt states she wants a referral for a plastic surgeon to have a keloid removed from her neck   Preference of office, provider, location: Joanne Morris is possible.  If referral order, have you been seen by this specialty before? No (If Yes, this issue or another issue? When? Where?  Can we respond through MyChart? No       Sent to pcp.

## 2023-06-18 NOTE — Telephone Encounter (Signed)
 Copied from CRM [id number]. Topic: Clinical - Medication Refill >> Jun 18, 2023 12:21 PM Dewanda Foots wrote: Medication:  zolpidem  (AMBIEN  CR) 12.5 MG CR tablet  Has the patient contacted their pharmacy? No (Agent: If no, request that the patient contact the pharmacy for the refill. If patient does not wish to contact the pharmacy document the reason why and proceed with request.) (Agent: If yes, when and what did the pharmacy advise?)  This is the patient's preferred pharmacy:   [facility] [address], [place] - [address] [place] [address] Phone: [phone] Fax: [phone]  Is this the correct pharmacy for this prescription? Yes If no, delete pharmacy and type the correct one.   Has the prescription been filled recently? No  Is the patient out of the medication? No  Has the patient been seen for an appointment in the last year OR does the patient have an upcoming appointment? Yes  Can we respond through MyChart? No  Agent: Please be advised that Rx refills may take up to 3 business days. We ask that you follow-up with your pharmacy.

## 2023-06-19 ENCOUNTER — Telehealth: Payer: Self-pay

## 2023-06-19 ENCOUNTER — Other Ambulatory Visit: Payer: Self-pay

## 2023-06-19 ENCOUNTER — Other Ambulatory Visit: Payer: Self-pay | Admitting: Family

## 2023-06-19 DIAGNOSIS — N6321 Unspecified lump in the left breast, upper outer quadrant: Secondary | ICD-10-CM

## 2023-06-19 NOTE — Addendum Note (Signed)
 Addended by: Alvie Jolly on: 06/19/2023 08:50 AM   Modules accepted: Orders

## 2023-06-19 NOTE — Telephone Encounter (Signed)
 Referral placed.

## 2023-06-19 NOTE — Telephone Encounter (Signed)
 Copied from CRM [id number]. Topic: General - Other >> Jun 18, 2023  2:58 PM [name] T wrote: Reason for CRM: patient is  in [place] the dr is not there and want be back until August patient is needing to see if the dr [name]  can get her appt for diagnostics  mammogram [place] are [place] office   I called pt, Pt is scheduled at the end of May in [place].

## 2023-06-24 NOTE — Telephone Encounter (Signed)
 Called pt insurance and was advised needing procedure/CPT code before advising if patient has this benefit through insurance and unable to provider a fax number as to where to send office notes and DME order. Insurance contact number [phone].

## 2023-06-25 NOTE — Telephone Encounter (Signed)
 recommend giving pt the direct phone # to Life Alert and she should get her daughter involved, thx.

## 2023-06-26 ENCOUNTER — Other Ambulatory Visit: Payer: Self-pay | Admitting: Family

## 2023-06-26 DIAGNOSIS — G894 Chronic pain syndrome: Secondary | ICD-10-CM

## 2023-06-26 NOTE — Telephone Encounter (Signed)
 I reached out to pt and lvm in regards. I left the direct line to [facility] - [phone].

## 2023-06-26 NOTE — Telephone Encounter (Signed)
 Received CRM stating pt called back, stating she got a call from office today. Please reach out to patient if needed.

## 2023-06-27 NOTE — Telephone Encounter (Signed)
 I returned pt's call, Advised pt to contact [facility] for life alert.

## 2023-07-01 NOTE — Addendum Note (Signed)
 Addended by: Erminia Hazel on: 07/01/2023 02:32 PM   Modules accepted: Orders

## 2023-07-01 NOTE — Telephone Encounter (Signed)
 Copied from CRM [id number]. Topic: Clinical - Medication Refill >> Jul 01, 2023 12:39 PM Emmet Harm C wrote: Medication: HYDROcodone -Acetaminophen  7.5-300 MG TABS  Has the patient contacted their pharmacy? Yes (Agent: If no, request that the patient contact the pharmacy for the refill. If patient does not wish to contact the pharmacy document the reason why and proceed with request.) (Agent: If yes, when and what did the pharmacy advise?)  This is the patient's preferred pharmacy:  [id number] [place], [place] - [address] [place] [address] Phone: [phone] Fax: [phone]   Is this the correct pharmacy for this prescription? Yes If no, delete pharmacy and type the correct one.   Has the prescription been filled recently? No  Is the patient out of the medication? Yes  Has the patient been seen for an appointment in the last year OR does the patient have an upcoming appointment? Yes  Can we respond through MyChart? No  Agent: Please be advised that Rx refills may take up to 3 business days. We ask that you follow-up with your pharmacy.

## 2023-07-03 ENCOUNTER — Other Ambulatory Visit: Payer: Self-pay | Admitting: Family

## 2023-07-03 ENCOUNTER — Ambulatory Visit
Admission: RE | Admit: 2023-07-03 | Discharge: 2023-07-03 | Disposition: A | Discharge status: Home / Self Care | Source: Ambulatory Visit | Attending: Family | Admitting: Family

## 2023-07-03 DIAGNOSIS — N6321 Unspecified lump in the left breast, upper outer quadrant: Secondary | ICD-10-CM

## 2023-07-04 ENCOUNTER — Other Ambulatory Visit

## 2023-07-08 ENCOUNTER — Other Ambulatory Visit: Payer: Self-pay | Admitting: Family

## 2023-07-08 ENCOUNTER — Ambulatory Visit: Payer: Self-pay | Admitting: Family

## 2023-07-08 ENCOUNTER — Other Ambulatory Visit

## 2023-07-15 ENCOUNTER — Other Ambulatory Visit

## 2023-07-17 ENCOUNTER — Other Ambulatory Visit: Payer: Self-pay | Admitting: Family

## 2023-07-17 ENCOUNTER — Institutional Professional Consult (permissible substitution): Admitting: Plastic Surgery

## 2023-07-17 DIAGNOSIS — K5903 Drug induced constipation: Secondary | ICD-10-CM

## 2023-07-22 ENCOUNTER — Other Ambulatory Visit

## 2023-07-29 ENCOUNTER — Other Ambulatory Visit

## 2023-08-04 ENCOUNTER — Telehealth: Payer: Self-pay | Admitting: Plastic Surgery

## 2023-08-04 NOTE — Telephone Encounter (Signed)
 called lvmail 08-04-23 to r/s provider in [place]

## 2023-08-05 ENCOUNTER — Ambulatory Visit
Admission: RE | Admit: 2023-08-05 | Discharge: 2023-08-05 | Disposition: A | Discharge status: Home / Self Care | Source: Ambulatory Visit | Attending: Family | Admitting: Family

## 2023-08-05 DIAGNOSIS — N6321 Unspecified lump in the left breast, upper outer quadrant: Secondary | ICD-10-CM

## 2023-08-05 DIAGNOSIS — C50412 Malignant neoplasm of upper-outer quadrant of left female breast: Secondary | ICD-10-CM | POA: Diagnosis not present

## 2023-08-05 DIAGNOSIS — Z170 Estrogen receptor positive status [ER+]: Secondary | ICD-10-CM | POA: Diagnosis not present

## 2023-08-05 HISTORY — PX: BREAST BIOPSY: SHX20

## 2023-08-06 ENCOUNTER — Telehealth: Payer: Self-pay | Admitting: *Deleted

## 2023-08-06 ENCOUNTER — Other Ambulatory Visit: Payer: Self-pay | Admitting: Family

## 2023-08-06 DIAGNOSIS — I10 Essential (primary) hypertension: Secondary | ICD-10-CM

## 2023-08-06 LAB — SURGICAL PATHOLOGY

## 2023-08-06 NOTE — Telephone Encounter (Signed)
 Spoke to patient to confirm upcoming morning [facility] clinic appointment on 7/2, paperwork will be sent via mail, to daughters address.  Gave location and time, also informed patient that the surgeon's office would be calling as well to get information from them similar to the packet that they will be receiving so make sure to do both.  Reminded patient that all providers will be coming to the clinic to see them HERE and if they had any questions to not hesitate to reach back out to myself or their navigators.

## 2023-08-11 ENCOUNTER — Encounter: Payer: Self-pay | Admitting: *Deleted

## 2023-08-11 DIAGNOSIS — Z171 Estrogen receptor negative status [ER-]: Secondary | ICD-10-CM | POA: Insufficient documentation

## 2023-08-12 NOTE — Progress Notes (Signed)
 Radiation Oncology         [phone] ________________________________  Multidisciplinary Breast Oncology Clinic [facility]) Initial Outpatient Consultation  Name: Joanne Morris MRN: [id number]  Date: 08/13/2023  DOB: 23-Sep-1950  RR:Yliwzoo, Corean, NP  Aron Shoulders, MD   REFERRING PHYSICIAN: Aron Shoulders, MD  DIAGNOSIS: There were no encounter diagnoses.  Stage *** Left Breast UOQ, Invasive Ductal Carcinoma, ER- / PR- / Her2-, Grade 2  No diagnosis found.  HISTORY OF PRESENT ILLNESS::Joanne Morris is a 73 y.o. female who is presenting to the office today for evaluation of her newly diagnosed breast cancer. She is accompanied by ***. She is doing well overall.   She initially had routine screening mammography in October of 2024 that showed a possible abnormality in the upper outer left breast. In this setting, she recently underwent a left breast diagnostic mammography with tomography and left breast ultrasonography [facility] on 07[facility]23 showing: a highly suspicious mass in the 1 o'clock left breast measuring 1.9 cm, located 8 cmfn. Imaging otherwise showed no evidence of left axillary lymphadenopathy.  Biopsy of the 1 o'clock left breast mass on 08/05/23 showed: grade 2 invasive ductal carcinoma measuring 1.2 cm in the greatest linear extent of the sample. Prognostic indicators significant for: estrogen receptor, 0% negative and progesterone receptor 0% negative. Proliferation marker Ki67 at 70%. HER2 negative.  Menarche: *** years old Age at first live birth: *** years old GP: *** LMP: *** Contraceptive: *** HRT: ***   The patient was referred today for presentation in the multidisciplinary conference.  Radiology studies and pathology slides were presented there for review and discussion of treatment options.  A consensus was discussed regarding potential next steps.  PREVIOUS RADIATION THERAPY: No  PAST MEDICAL HISTORY:  Past Medical History:  Diagnosis Date    Abdominal pain 10/11/2012   Acute kidney injury superimposed on CKD (HCC) 08/01/2018   Anemia 05/31/2011   Angina    Anxiety    Arthritis    knees (07/20/2013)   Atypical chest pain 05/31/2011   Carotid stenosis, asymptomatic 04/18/2014   Claudication (HCC)    COVID-19 virus infection 08/01/2018   Depression    GERD (gastroesophageal reflux disease)    Hematoma of neck 04/23/2014   High cholesterol    Hyperlipemia 05/31/2011   Hypokalemia 05/31/2011   Leucocytosis 05/31/2011   Myocardial infarction (HCC) 1990's   1   Neck pain on right side 10/04/2014   PAD (peripheral artery disease) (HCC)    Peripheral arterial disease (HCC) 06/08/2013   Peripheral arterial disease   Radiculopathy of leg 10/11/2012   Stroke (HCC)    mini stroke 1st then regular stroke, denies residual on 07/20/2013   Wears glasses     PAST SURGICAL HISTORY: Past Surgical History:  Procedure Laterality Date   ABDOMINAL HYSTERECTOMY     ATHERECTOMY  10/05/2013   BALLOON ANGIOPLASTY, ARTERY  10/05/2013   DR LADONA   BREAST BIOPSY Left 08/05/2023   US  LT BREAST BX W LOC DEV 1ST LESION IMG BX SPEC US  GUIDE 08/05/2023 [facility] MAMMOGRAPHY   CAROTID ENDARTERECTOMY     CORONARY ANGIOPLASTY WITH STENT PLACEMENT     1   ENDARTERECTOMY Left 02/07/2014   Procedure: ENDARTERECTOMY CAROTID;  Surgeon: Carlin FORBES Haddock, MD;  Location: [facility] OR;  Service: Vascular;  Laterality: Left;   ENDARTERECTOMY Right 04/18/2014   Procedure: ENDARTERECTOMY RIGHT CAROTID;  Surgeon: Carlin FORBES Haddock, MD;  Location: [facility] OR;  Service: Vascular;  Laterality: Right;   ENDARTERECTOMY N/A  04/24/2014   Procedure: IRRIGATION AND DEBRIDEMENT OF RIGHT NECK ;  Surgeon: Carlin FORBES Haddock, MD;  Location: [facility] OR;  Service: Vascular;  Laterality: N/A;   ESOPHAGOGASTRODUODENOSCOPY N/A 10/13/2012   Procedure: ESOPHAGOGASTRODUODENOSCOPY (EGD);  Surgeon: Lupita FORBES Commander, MD;  Location: [facility] ENDOSCOPY;  Service: Endoscopy;  Laterality: N/A;   KENALOG  INJECTION  Bilateral 08/22/2016   Procedure: KENALOG  INJECTION BILATERAL NECK;  Surgeon: Lowery Estefana RAMAN, DO;  Location: [facility];  Service: Plastics;  Laterality: Bilateral;   LOWER EXTREMITY ANGIOGRAM  07/20/2013   Unsuccessful attempt at crossing the CTO/notes 07/20/2013   LOWER EXTREMITY ANGIOGRAM N/A 07/06/2013   Procedure: LOWER EXTREMITY ANGIOGRAM;  Surgeon: Erick JONELLE Bergamo, MD;  Location: [facility] CATH LAB;  Service: Cardiovascular;  Laterality: N/A;   LOWER EXTREMITY ANGIOGRAM N/A 07/20/2013   Procedure: LOWER EXTREMITY ANGIOGRAM;  Surgeon: Erick JONELLE Bergamo, MD;  Location: [facility] CATH LAB;  Service: Cardiovascular;  Laterality: N/A;   LOWER EXTREMITY ANGIOGRAM N/A 10/05/2013   Procedure: LOWER EXTREMITY ANGIOGRAM;  Surgeon: Erick JONELLE Bergamo, MD;  Location: [facility] CATH LAB;  Service: Cardiovascular;  Laterality: N/A;   MASS EXCISION Right 08/22/2016   Procedure: EXCISION RIGHT NECK KELOID;  Surgeon: Lowery Estefana RAMAN, DO;  Location: [facility];  Service: Plastics;  Laterality: Right;   MASS EXCISION Right 09/18/2020   Procedure: Excision of right neck keloid;  Surgeon: Lowery Estefana RAMAN, DO;  Location: [facility] OR;  Service: Plastics;  Laterality: Right;   PATCH ANGIOPLASTY Left 02/07/2014   Procedure: PATCH ANGIOPLASTY Carotid;  Surgeon: Carlin FORBES Haddock, MD;  Location: [facility] OR;  Service: Vascular;  Laterality: Left;   PATCH ANGIOPLASTY Right 04/18/2014   Procedure: PATCH ANGIOPLASTY USING HEMASHIELD 0.8cmx 7.6cm PATCH;  Surgeon: Carlin FORBES Haddock, MD;  Location: [facility] OR;  Service: Vascular;  Laterality: Right;   PERIPHERAL VASCULAR CATHETERIZATION N/A 07/05/2014   Procedure: Lower Extremity Angiography;  Surgeon: Gordy Bergamo, MD;  Location: [facility] INVASIVE CV LAB;  Service: Cardiovascular;  Laterality: N/A;   TUBAL LIGATION      FAMILY HISTORY: No family history on file.  SOCIAL HISTORY:  Social History   Socioeconomic History   Marital status: Legally Separated    Spouse name: Not on  file   Number of children: Not on file   Years of education: Not on file   Highest education level: Not on file  Occupational History   Not on file  Tobacco Use   Smoking status: Former    Current packs/day: 0.00    Average packs/day: 0.5 packs/day for 4.0 years (2.0 ttl pk-yrs)    Types: Cigarettes    Start date: 05/30/2005    Quit date: 05/30/2009    Years since quitting: 14.2   Smokeless tobacco: Never  Vaping Use   Vaping status: Never Used  Substance and Sexual Activity   Alcohol use: No    Alcohol/week: 0.0 standard drinks of alcohol   Drug use: No   Sexual activity: Yes    Birth control/protection: Post-menopausal  Other Topics Concern   Not on file  Social History Narrative   Not on file   Social Drivers of Health   Financial Resource Strain: Low Risk  (08/05/2022)   Overall Financial Resource Strain (CARDIA)    Difficulty of Paying Living Expenses: Not hard at all  Food Insecurity: No Food Insecurity (08/05/2022)   Hunger Vital Sign    Worried About Running Out of Food in the Last Year: Never true    Ran Out of  Food in the Last Year: Never true  Transportation Needs: No Transportation Needs (08/05/2022)   PRAPARE - Administrator, Civil Service (Medical): No    Lack of Transportation (Non-Medical): No  Physical Activity: Insufficiently Active (08/05/2022)   Exercise Vital Sign    Days of Exercise per Week: 3 days    Minutes of Exercise per Session: 10 min  Stress: No Stress Concern Present (08/05/2022)   [employer] of Occupational Health - Occupational Stress Questionnaire    Feeling of Stress : Not at all  Social Connections: Moderately Isolated (08/05/2022)   Social Connection and Isolation Panel    Frequency of Communication with Friends and Family: More than three times a week    Frequency of Social Gatherings with Friends and Family: More than three times a week    Attends Religious Services: More than 4 times per year    Active Member of  [employer] or Organizations: No    Attends Banker Meetings: Never    Marital Status: Separated    ALLERGIES: No Known Allergies  MEDICATIONS:  Current Outpatient Medications  Medication Sig Dispense Refill   acetaminophen  (TYLENOL ) 650 MG CR tablet Take 1 tablet (650 mg total) by mouth every evening. FOR PAIN. 30 tablet 11   amLODipine  (NORVASC ) 10 MG tablet TAKE ONE TABLET BY MOUTH DAILY AT 9AM (VIAL) 90 tablet 3   aspirin  EC 81 MG tablet Take 81 mg by mouth daily.     busPIRone  (BUSPAR ) 5 MG tablet TAKE ONE TABLET (5MG  TOTAL) BY MOUTH TWICE DAILY 60 tablet 5   CALCIUM -VITAMIN D PO Take 1 tablet by mouth daily.     cilostazol  (PLETAL ) 100 MG tablet Take 1 tablet (100 mg total) by mouth 2 (two) times daily. 180 tablet 3   dapagliflozin  propanediol (FARXIGA ) 10 MG TABS tablet Take 1 tablet (10 mg total) by mouth in the morning. 90 tablet 3   diclofenac  Sodium (VOLTAREN ) 1 % GEL APPLY 2 GRAMS TOPICALLY FOUR TIMES DAILY 100 g 3   diphenoxylate -atropine  (LOMOTIL ) 2.5-0.025 MG tablet Take 1 tablet by mouth 4 (four) times daily as needed for diarrhea or loose stools (Max dose of 8 tabs in 24 hours). 30 tablet 1   glipiZIDE  (GLUCOTROL  XL) 5 MG 24 hr tablet Take 1 tablet (5 mg total) by mouth daily with breakfast. 90 tablet 3   hydrALAZINE  (APRESOLINE ) 50 MG tablet Take 1 tablet (50 mg total) by mouth 3 (three) times daily. 270 tablet 3   HYDROcodone -Acetaminophen  7.5-300 MG TABS Take 1 tablet by mouth daily as needed. After eating. 30 tablet 0   HYDROcodone -Acetaminophen  7.5-300 MG TABS Take 1 tablet by mouth daily as needed. After eating. 30 tablet 0   levocetirizine (XYZAL ) 5 MG tablet Take 1 tablet (5 mg total) by mouth daily as needed for allergies. 90 tablet 3   LINZESS  145 MCG CAPS capsule TAKE ONE CAPSULE ( TOTAL) BY MOUTH EVERY DAY 90 capsule 11   metoprolol  tartrate (LOPRESSOR ) 100 MG tablet TAKE ONE TABLET BY MOUTH TWICE DAILY @9AM -5PM 180 tablet 1   niacin  (VITAMIN B3)  500 MG ER tablet Take 1 tablet (500 mg total) by mouth daily. 90 tablet 3   polyethylene glycol powder (GLYCOLAX /MIRALAX ) 17 GM/SCOOP powder Take 17 g by mouth daily as needed for constipation.     pravastatin  (PRAVACHOL ) 40 MG tablet TAKE ONE TABLET BY MOUTH DAILY AT 5PM (VIAL) 90 tablet 3   sertraline  (ZOLOFT ) 100 MG tablet TAKE ONE TABLET BY  MOUTH DAILY AT 9AM IN ADDITION TO 50 MG TABLET (VIAL) 90 tablet 3   sertraline  (ZOLOFT ) 50 MG tablet TAKE ONE TABLET BY MOUTH DAILY AT 5PM EVERY EVENING 90 tablet 3   valsartan  (DIOVAN ) 320 MG tablet TAKE ONE TABLET BY MOUTH DAILY AT 9AM 100 tablet 0   zolpidem  (AMBIEN  CR) 12.5 MG CR tablet Take 1 tablet (12.5 mg total) by mouth at bedtime. 30 tablet 5   No current facility-administered medications for this encounter.    REVIEW OF SYSTEMS: A 10+ POINT REVIEW OF SYSTEMS WAS OBTAINED including neurology, dermatology, psychiatry, cardiac, respiratory, lymph, extremities, GI, GU, musculoskeletal, constitutional, reproductive, HEENT. On the provided form, she reports ***. She denies *** and any other symptoms.    PHYSICAL EXAM:  vitals were not taken for this visit.  {may need to copy over vitals} Lungs are clear to auscultation bilaterally. Heart has regular rate and rhythm. No palpable cervical, supraclavicular, or axillary adenopathy. Abdomen soft, non-tender, normal bowel sounds. Breast: Right breast with no palpable mass, nipple discharge, or bleeding. Left breast with ***.   KPS = ***  100 - Normal; no complaints; no evidence of disease. 90   - Able to carry on normal activity; minor signs or symptoms of disease. 80   - Normal activity with effort; some signs or symptoms of disease. 34   - Cares for self; unable to carry on normal activity or to do active work. 60   - Requires occasional assistance, but is able to care for most of his personal needs. 50   - Requires considerable assistance and frequent medical care. 40   - Disabled; requires special  care and assistance. 30   - Severely disabled; hospital admission is indicated although death not imminent. 20   - Very sick; hospital admission necessary; active supportive treatment necessary. 10   - Moribund; fatal processes progressing rapidly. 0     - Dead  Karnofsky DA, Abelmann WH, Craver LS and Burchenal [facility] [phone]) The use of the nitrogen mustards in the palliative treatment of carcinoma: with particular reference to bronchogenic carcinoma Cancer 1 634-56  LABORATORY DATA:  Lab Results  Component Value Date   WBC 9.2 10/22/2022   HGB 12.3 10/22/2022   HCT 40.3 10/22/2022   MCV 78.0 10/22/2022   PLT 340.0 10/22/2022   Lab Results  Component Value Date   NA 140 10/22/2022   K 4.1 10/22/2022   CL 104 10/22/2022   CO2 28 10/22/2022   Lab Results  Component Value Date   ALT 16 10/22/2022   AST 22 10/22/2022   ALKPHOS 83 10/22/2022   BILITOT 0.3 10/22/2022    PULMONARY FUNCTION TEST:   Review Flowsheet        No data to display          RADIOGRAPHY: US  LT BREAST BX W LOC DEV 1ST LESION IMG BX SPEC US  GUIDE Addendum Date: 08/06/2023 ADDENDUM REPORT: 08/06/2023 11:27 ADDENDUM: Pathology revealed: GRADE II INVASIVE DUCTAL CARCINOMA of the LEFT breast, 1:00 o'clock, 8cmfn, (ribbon clip). This was found to be concordant by Dr. Madeleine Satterfield. Pathology results were discussed with the patient and her daughter, Lyndon Derby, by telephone. The patient reported doing well after the biopsy with moderate tenderness at the site. Post biopsy instructions and care were reviewed and questions were answered. The patient was encouraged to call [facility] for any additional concerns. My direct phone number was provided for both the patient and her  daughter. The patient was referred to [facility] on August 13, 2023. Pathology results reported by Hendricks Benders, RN on 08/06/2023.  Electronically Signed   By: Debby Satterfield M.D.   On: 08/06/2023 11:27   Result Date: 08/06/2023 CLINICAL DATA:  Screening detected highly suspicious 1.9 cm mass LEFT breast UPPER OUTER QUADRANT at 1 oc 8 cm FN. EXAM: ULTRASOUND GUIDED LEFT BREAST CORE NEEDLE BIOPSY COMPARISON:  Previous exam(s). PROCEDURE: I met with the patient and we discussed the procedure of ultrasound-guided biopsy, including benefits and alternatives. We discussed the high likelihood of a successful procedure. We discussed the risks of the procedure, including infection, bleeding, tissue injury, clip migration, and inadequate sampling. Informed written consent was given. The usual time-out protocol was performed immediately prior to the procedure. Lesion quadrant: UPPER OUTER QUADRANT. Using sterile technique with chlorhexidine  as skin antisepsis, 1% lidocaine  and 1% lidocaine  with epinephrine  as local anesthetic, under direct ultrasound visualization, a 12 gauge Bard Marquee core needle device placed through an 11 gauge introducer needle was used to perform biopsy of the mass in the UPPER OUTER QUADRANT using a lateral approach. At the conclusion of the procedure, a ribbon shaped tissue marker clip was deployed into the biopsy cavity. The patient tolerated the procedure well without apparent immediate complications. Follow up 2 view mammogram was performed in order to confirm clip placement and was dictated separately. IMPRESSION: Ultrasound guided core needle biopsy of a highly suspicious 1.9 cm mass in the UPPER OUTER QUADRANT of the LEFT breast. Electronically Signed: By: Debby Satterfield M.D. On: 08/05/2023 11:04   MM CLIP PLACEMENT LEFT Result Date: 08/05/2023 CLINICAL DATA:  Confirmation of clip placement after ultrasound-guided core needle biopsy of a highly suspicious 1.9 cm mass in the UPPER OUTER QUADRANT of the LEFT breast. EXAM: 2D and 3D DIAGNOSTIC LEFT MAMMOGRAM POST ULTRASOUND BIOPSY COMPARISON:  Previous exam(s). ACR  Breast Density Category b: There are scattered areas of fibroglandular density. FINDINGS: 2D and 3D full field CC and mediolateral mammographic images were obtained following ultrasound guided biopsy of a highly suspicious mass in the UPPER OUTER QUADRANT of the LEFT breast. The ribbon shaped tissue marking clip is appropriately positioned within the biopsied mass. Expected post biopsy changes are present without evidence of hematoma. IMPRESSION: Appropriate positioning of the ribbon shaped tissue marking clip within the biopsied mass in the UPPER OUTER QUADRANT of the LEFT breast. Final Assessment: Post Procedure Mammograms for Marker Placement Electronically Signed   By: Debby Satterfield M.D.   On: 08/05/2023 11:03      IMPRESSION: *** {DIAGNOSIS HERE}  Patient will be a good candidate for breast conservation with radiotherapy to the left breast. We discussed the general course of radiation, potential side effects, and toxicities with radiation and the patient is interested in this approach. ***   PLAN:  *** (copy notes from board at conference)   ------------------------------------------------  Lynwood CHARM Nasuti, PhD, MD  This document serves as a record of services personally performed by Lynwood Nasuti, MD. It was created on his behalf by Dorthy Fuse, a trained medical scribe. The creation of this record is based on the scribe's personal observations and the provider's statements to them. This document has been checked and approved by the attending provider.

## 2023-08-13 ENCOUNTER — Ambulatory Visit

## 2023-08-13 ENCOUNTER — Encounter: Payer: Self-pay | Admitting: *Deleted

## 2023-08-13 ENCOUNTER — Other Ambulatory Visit: Payer: Self-pay

## 2023-08-13 ENCOUNTER — Ambulatory Visit
Admission: RE | Admit: 2023-08-13 | Discharge: 2023-08-13 | Disposition: A | Discharge status: Home / Self Care | Source: Ambulatory Visit | Attending: Radiation Oncology | Admitting: Radiation Oncology

## 2023-08-13 ENCOUNTER — Inpatient Hospital Stay: Admitting: Genetic Counselor

## 2023-08-13 ENCOUNTER — Inpatient Hospital Stay

## 2023-08-13 ENCOUNTER — Ambulatory Visit: Attending: General Surgery | Admitting: Physical Therapy

## 2023-08-13 ENCOUNTER — Inpatient Hospital Stay: Attending: Hematology and Oncology | Admitting: Hematology and Oncology

## 2023-08-13 ENCOUNTER — Encounter: Payer: Self-pay | Admitting: Physical Therapy

## 2023-08-13 ENCOUNTER — Encounter: Payer: Self-pay | Admitting: Genetic Counselor

## 2023-08-13 ENCOUNTER — Other Ambulatory Visit: Payer: Self-pay | Admitting: General Surgery

## 2023-08-13 VITALS — Ht 59.0 in | Wt 143.0 lb

## 2023-08-13 VITALS — BP 122/60 | HR 68 | Temp 97.6°F | Resp 18 | Ht 59.0 in | Wt 143.0 lb

## 2023-08-13 DIAGNOSIS — Z800 Family history of malignant neoplasm of digestive organs: Secondary | ICD-10-CM | POA: Diagnosis not present

## 2023-08-13 DIAGNOSIS — R262 Difficulty in walking, not elsewhere classified: Secondary | ICD-10-CM | POA: Insufficient documentation

## 2023-08-13 DIAGNOSIS — Z87891 Personal history of nicotine dependence: Secondary | ICD-10-CM | POA: Diagnosis not present

## 2023-08-13 DIAGNOSIS — Z171 Estrogen receptor negative status [ER-]: Secondary | ICD-10-CM | POA: Diagnosis not present

## 2023-08-13 DIAGNOSIS — I7389 Other specified peripheral vascular diseases: Secondary | ICD-10-CM | POA: Diagnosis not present

## 2023-08-13 DIAGNOSIS — N183 Chronic kidney disease, stage 3 unspecified: Secondary | ICD-10-CM

## 2023-08-13 DIAGNOSIS — C50412 Malignant neoplasm of upper-outer quadrant of left female breast: Secondary | ICD-10-CM

## 2023-08-13 DIAGNOSIS — E1151 Type 2 diabetes mellitus with diabetic peripheral angiopathy without gangrene: Secondary | ICD-10-CM | POA: Diagnosis not present

## 2023-08-13 DIAGNOSIS — Z0000 Encounter for general adult medical examination without abnormal findings: Secondary | ICD-10-CM

## 2023-08-13 DIAGNOSIS — E1122 Type 2 diabetes mellitus with diabetic chronic kidney disease: Secondary | ICD-10-CM | POA: Diagnosis not present

## 2023-08-13 DIAGNOSIS — Z955 Presence of coronary angioplasty implant and graft: Secondary | ICD-10-CM | POA: Diagnosis not present

## 2023-08-13 DIAGNOSIS — Z9889 Other specified postprocedural states: Secondary | ICD-10-CM | POA: Diagnosis not present

## 2023-08-13 DIAGNOSIS — R293 Abnormal posture: Secondary | ICD-10-CM | POA: Diagnosis not present

## 2023-08-13 LAB — CBC WITH DIFFERENTIAL (CANCER CENTER ONLY)
Abs Immature Granulocytes: 0.03 10*3/uL (ref 0.00–0.07)
Basophils Absolute: 0 10*3/uL (ref 0.0–0.1)
Basophils Relative: 0 %
Eosinophils Absolute: 0.1 10*3/uL (ref 0.0–0.5)
Eosinophils Relative: 2 %
HCT: 41.8 % (ref 36.0–46.0)
Hemoglobin: 13.2 g/dL (ref 12.0–15.0)
Immature Granulocytes: 0 %
Lymphocytes Relative: 35 %
Lymphs Abs: 2.8 10*3/uL (ref 0.7–4.0)
MCH: 24.4 pg — ABNORMAL LOW (ref 26.0–34.0)
MCHC: 31.6 g/dL (ref 30.0–36.0)
MCV: 77.3 fL — ABNORMAL LOW (ref 80.0–100.0)
Monocytes Absolute: 0.5 10*3/uL (ref 0.1–1.0)
Monocytes Relative: 6 %
Neutro Abs: 4.7 10*3/uL (ref 1.7–7.7)
Neutrophils Relative %: 57 %
Platelet Count: 302 10*3/uL (ref 150–400)
RBC: 5.41 MIL/uL — ABNORMAL HIGH (ref 3.87–5.11)
RDW: 15.4 % (ref 11.5–15.5)
WBC Count: 8.2 10*3/uL (ref 4.0–10.5)
nRBC: 0 % (ref 0.0–0.2)

## 2023-08-13 LAB — CMP (CANCER CENTER ONLY)
ALT: 17 U/L (ref 0–44)
AST: 26 U/L (ref 15–41)
Albumin: 4.5 g/dL (ref 3.5–5.0)
Alkaline Phosphatase: 80 U/L (ref 38–126)
Anion gap: 10 (ref 5–15)
BUN: 16 mg/dL (ref 8–23)
CO2: 26 mmol/L (ref 22–32)
Calcium: 10.4 mg/dL — ABNORMAL HIGH (ref 8.9–10.3)
Chloride: 103 mmol/L (ref 98–111)
Creatinine: 1.14 mg/dL — ABNORMAL HIGH (ref 0.44–1.00)
GFR, Estimated: 51 mL/min — ABNORMAL LOW (ref >60)
Glucose, Bld: 218 mg/dL — ABNORMAL HIGH (ref 70–99)
Potassium: 4.9 mmol/L (ref 3.5–5.1)
Sodium: 139 mmol/L (ref 135–145)
Total Bilirubin: 0.4 mg/dL (ref 0.0–1.2)
Total Protein: 8 g/dL (ref 6.5–8.1)

## 2023-08-13 LAB — GENETIC SCREENING ORDER

## 2023-08-13 NOTE — Patient Instructions (Signed)
 Ms. Lawyer , Thank you for taking time out of your busy schedule to complete your Annual Wellness Visit with me. I enjoyed our conversation and look forward to speaking with you again next year. I, as well as your care team,  appreciate your ongoing commitment to your health goals. Please review the following plan we discussed and let me know if I can assist you in the future. Your Game plan/ To Do List    Referrals: If you haven't heard from the office you've been referred to, please reach out to them at the phone provided.   Follow up Visits: Next Medicare AWV with our clinical staff: 08/17/24   Have you seen your provider in the last 6 months (3 months if uncontrolled diabetes)? Yes Next Office Visit with your provider: 09/30/23  Clinician Recommendations:  Aim for 30 minutes of exercise or brisk walking, 6-8 glasses of water, and 5 servings of fruits and vegetables each day.       This is a list of the screening recommended for you and due dates:  Health Maintenance  Topic Date Due   Yearly kidney health urinalysis for diabetes  Never done   Hemoglobin A1C  04/21/2023   Medicare Annual Wellness Visit  08/05/2023   Eye exam for diabetics  08/18/2023*   Mammogram  04/27/2024*   Complete foot exam   02/10/2025*   Flu Shot  09/12/2023   Yearly kidney function blood test for diabetes  10/22/2023   Hepatitis B Vaccine  Aged Out   HPV Vaccine  Aged Out   Meningitis B Vaccine  Aged Out   DTaP/Tdap/Td vaccine  Discontinued   Pneumococcal Vaccine for age over 39  Discontinued   DEXA scan (bone density measurement)  Discontinued   Colon Cancer Screening  Discontinued   COVID-19 Vaccine  Discontinued   Hepatitis C Screening  Discontinued   Zoster (Shingles) Vaccine  Discontinued  *Topic was postponed. The date shown is not the original due date.    Advanced directives: (Copy Requested) Please bring a copy of your health care power of attorney and living will to the office to be added to  your chart at your convenience. You can mail to [facility] [address] [place], [place] [address] or email to [email or web address] Advance Care Planning is important because it:  [x]  Makes sure you receive the medical care that is consistent with your values, goals, and preferences  [x]  It provides guidance to your family and loved ones and reduces their decisional burden about whether or not they are making the right decisions based on your wishes.  Follow the link provided in your after visit summary or read over the paperwork we have mailed to you to help you started getting your Advance Directives in place. If you need assistance in completing these, please reach out to us  so that we can help you!  See attachments for Preventive Care and Fall Prevention Tips.

## 2023-08-13 NOTE — Research (Signed)
 [employer] 2021-05 - Specimen Collection Study to Evaluate Biomarkers in Subjects with Cancer    Patient Joanne Morris was identified by Dr. Gudena as a potential candidate for the above listed study.  This Clinical Research Coordinator met with Luverta Korte, [id number], on 08/13/23 in a manner and location that ensures patient privacy to discuss participation in the above listed research study.  Patient is Accompanied by her daughter.  A copy of the informed consent document with embedded HIPAA language was provided to the patient.  Patient reads, speaks, and understands [place].   Patient was provided with the business card of this Coordinator and encouraged to contact the research team with any questions.  Approximately 15  minutes were spent with the patient reviewing the informed consent documents.  Patient was provided the option of taking informed consent documents home to review and was encouraged to review at their convenience with their support network, including other care providers. Patient took the consent documents home to review.   Pt was thanked for their time. This CRC will call pt to follow up at 1:30pm on 08/19/23.   Claude Swendsen, Ph.D. Clinical Research Coordinator [phone] 08/13/2023 4:06 PM

## 2023-08-13 NOTE — Progress Notes (Signed)
 REFERRING PROVIDER: Odean Potts, MD [address] [place],  [place] [address]  PRIMARY PROVIDER:  Lucius Krabbe, NP  PRIMARY REASON FOR VISIT:  1. Family history of colon cancer   2. Malignant neoplasm of upper-outer quadrant of left breast in female, estrogen receptor negative (HCC)      HISTORY OF PRESENT ILLNESS:   Joanne Morris, a 74 y.o. female, was seen for a [place] cancer genetics consultation at the request of Dr. Odean due to a personal and family history of cancer.  Joanne Morris presents to clinic today to discuss the possibility of a hereditary predisposition to cancer, genetic testing, and to further clarify her future cancer risks, as well as potential cancer risks for family members.   In July 2025, at the age of 6, Joanne Morris was diagnosed with triple negative breast cancer.    CANCER HISTORY:  Oncology History  Malignant neoplasm of upper-outer quadrant of left breast in female, estrogen receptor negative (HCC)  08/11/2023 Initial Diagnosis   Screening mammogram detected left breast mass upper outer quadrant 1.9 cm by ultrasound axilla negative biopsy: Grade 2 IDC triple negative Ki-67 70%   08/13/2023 Cancer Staging   Staging form: Breast, AJCC 8th Edition - Clinical stage from 08/13/2023: Stage IB (cT1c, cN0, cM0, G2, ER-, PR-, HER2-) - Signed by Odean Potts, MD on 08/13/2023 Stage prefix: Initial diagnosis Histologic grading system: 3 grade system Laterality: Left Staged by: Pathologist and managing physician Stage used in treatment planning: Yes National guidelines used in treatment planning: Yes Type of national guideline used in treatment planning: NCCN     Past Medical History:  Diagnosis Date   Abdominal pain 10/11/2012   Acute kidney injury superimposed on CKD (HCC) 08/01/2018   Anemia 05/31/2011   Angina    Anxiety    Arthritis    knees (07/20/2013)   Atypical chest pain 05/31/2011   Carotid stenosis, asymptomatic 04/18/2014    Claudication (HCC)    COVID-19 virus infection 08/01/2018   Depression    Family history of colon cancer    GERD (gastroesophageal reflux disease)    Hematoma of neck 04/23/2014   High cholesterol    Hyperlipemia 05/31/2011   Hypokalemia 05/31/2011   Leucocytosis 05/31/2011   Myocardial infarction (HCC) 1990's   1   Neck pain on right side 10/04/2014   PAD (peripheral artery disease) (HCC)    Peripheral arterial disease (HCC) 06/08/2013   Peripheral arterial disease   Radiculopathy of leg 10/11/2012   Stroke (HCC)    mini stroke 1st then regular stroke, denies residual on 07/20/2013   Wears glasses     Past Surgical History:  Procedure Laterality Date   ABDOMINAL HYSTERECTOMY     ATHERECTOMY  10/05/2013   BALLOON ANGIOPLASTY, ARTERY  10/05/2013   DR LADONA   BREAST BIOPSY Left 08/05/2023   US  LT BREAST BX W LOC DEV 1ST LESION IMG BX SPEC US  GUIDE 08/05/2023 [facility] MAMMOGRAPHY   CAROTID ENDARTERECTOMY     CORONARY ANGIOPLASTY WITH STENT PLACEMENT     1   ENDARTERECTOMY Left 02/07/2014   Procedure: ENDARTERECTOMY CAROTID;  Surgeon: Carlin FORBES Haddock, MD;  Location: [facility] OR;  Service: Vascular;  Laterality: Left;   ENDARTERECTOMY Right 04/18/2014   Procedure: ENDARTERECTOMY RIGHT CAROTID;  Surgeon: Carlin FORBES Haddock, MD;  Location: [facility] OR;  Service: Vascular;  Laterality: Right;   ENDARTERECTOMY N/A 04/24/2014   Procedure: IRRIGATION AND DEBRIDEMENT OF RIGHT NECK ;  Surgeon: Carlin FORBES Haddock, MD;  Location: [facility]  OR;  Service: Vascular;  Laterality: N/A;   ESOPHAGOGASTRODUODENOSCOPY N/A 10/13/2012   Procedure: ESOPHAGOGASTRODUODENOSCOPY (EGD);  Surgeon: Lupita FORBES Commander, MD;  Location: [facility] ENDOSCOPY;  Service: Endoscopy;  Laterality: N/A;   KENALOG  INJECTION Bilateral 08/22/2016   Procedure: KENALOG  INJECTION BILATERAL NECK;  Surgeon: Lowery Estefana RAMAN, DO;  Location: [facility];  Service: Plastics;  Laterality: Bilateral;   LOWER EXTREMITY ANGIOGRAM  07/20/2013   Unsuccessful  attempt at crossing the CTO/notes 07/20/2013   LOWER EXTREMITY ANGIOGRAM N/A 07/06/2013   Procedure: LOWER EXTREMITY ANGIOGRAM;  Surgeon: Erick JONELLE Bergamo, MD;  Location: [facility] CATH LAB;  Service: Cardiovascular;  Laterality: N/A;   LOWER EXTREMITY ANGIOGRAM N/A 07/20/2013   Procedure: LOWER EXTREMITY ANGIOGRAM;  Surgeon: Erick JONELLE Bergamo, MD;  Location: [facility] CATH LAB;  Service: Cardiovascular;  Laterality: N/A;   LOWER EXTREMITY ANGIOGRAM N/A 10/05/2013   Procedure: LOWER EXTREMITY ANGIOGRAM;  Surgeon: Erick JONELLE Bergamo, MD;  Location: [facility] CATH LAB;  Service: Cardiovascular;  Laterality: N/A;   MASS EXCISION Right 08/22/2016   Procedure: EXCISION RIGHT NECK KELOID;  Surgeon: Lowery Estefana RAMAN, DO;  Location: [facility];  Service: Plastics;  Laterality: Right;   MASS EXCISION Right 09/18/2020   Procedure: Excision of right neck keloid;  Surgeon: Lowery Estefana RAMAN, DO;  Location: [facility] OR;  Service: Plastics;  Laterality: Right;   PATCH ANGIOPLASTY Left 02/07/2014   Procedure: PATCH ANGIOPLASTY Carotid;  Surgeon: Carlin FORBES Haddock, MD;  Location: [facility] OR;  Service: Vascular;  Laterality: Left;   PATCH ANGIOPLASTY Right 04/18/2014   Procedure: PATCH ANGIOPLASTY USING HEMASHIELD 0.8cmx 7.6cm PATCH;  Surgeon: Carlin FORBES Haddock, MD;  Location: [facility] OR;  Service: Vascular;  Laterality: Right;   PERIPHERAL VASCULAR CATHETERIZATION N/A 07/05/2014   Procedure: Lower Extremity Angiography;  Surgeon: Gordy Bergamo, MD;  Location: [facility] INVASIVE CV LAB;  Service: Cardiovascular;  Laterality: N/A;   TUBAL LIGATION      Social History   Socioeconomic History   Marital status: Legally Separated    Spouse name: Not on file   Number of children: Not on file   Years of education: Not on file   Highest education level: Not on file  Occupational History   Not on file  Tobacco Use   Smoking status: Former    Current packs/day: 0.00    Average packs/day: 0.5 packs/day for 4.0 years (2.0 ttl pk-yrs)    Types:  Cigarettes    Start date: 05/30/2005    Quit date: 05/30/2009    Years since quitting: 14.2   Smokeless tobacco: Never  Vaping Use   Vaping status: Never Used  Substance and Sexual Activity   Alcohol use: No    Alcohol/week: 0.0 standard drinks of alcohol   Drug use: No   Sexual activity: Yes    Birth control/protection: Post-menopausal  Other Topics Concern   Not on file  Social History Narrative   Not on file   Social Drivers of Health   Financial Resource Strain: Low Risk  (08/13/2023)   Overall Financial Resource Strain (CARDIA)    Difficulty of Paying Living Expenses: Not hard at all  Food Insecurity: No Food Insecurity (08/13/2023)   Hunger Vital Sign    Worried About Running Out of Food in the Last Year: Never true    Ran Out of Food in the Last Year: Never true  Transportation Needs: No Transportation Needs (08/13/2023)   PRAPARE - Transportation    Lack of Transportation (Medical): No    Lack  of Transportation (Non-Medical): No  Physical Activity: Sufficiently Active (08/13/2023)   Exercise Vital Sign    Days of Exercise per Week: 5 days    Minutes of Exercise per Session: 30 min  Stress: No Stress Concern Present (08/13/2023)   [employer] of Occupational Health - Occupational Stress Questionnaire    Feeling of Stress: Not at all  Social Connections: Moderately Isolated (08/13/2023)   Social Connection and Isolation Panel    Frequency of Communication with Friends and Family: More than three times a week    Frequency of Social Gatherings with Friends and Family: More than three times a week    Attends Religious Services: More than 4 times per year    Active Member of [employer] or Organizations: No    Attends Banker Meetings: Never    Marital Status: Separated     FAMILY HISTORY:  We obtained a detailed, 4-generation family history.  Significant diagnoses are listed below: Family History  Problem Relation Age of Onset   Cancer Mother        unknown  form, d. < 16   Colon cancer Brother        d. > 50      The patient has a son and daughter who are cancer free. She has one full brother who has colon cancer.  Her mother died when she was very young of an unknown cancer.  The patient's father is also deceased. His family history is unknown.  Joanne Morris is unaware of previous family history of genetic testing for hereditary cancer risks.   GENETIC COUNSELING ASSESSMENT: Joanne Morris is a 73 y.o. female with a personal and family history of cancer which is somewhat suggestive of a hereditary cancer syndrome and predisposition to cancer given her triple negative breast cancer and family history. We, therefore, discussed and recommended the following at today's visit.   DISCUSSION: We discussed that, in general, most cancer is not inherited in families, but instead is sporadic or familial. Sporadic cancers occur by chance and typically happen at older ages (>50 years) as this type of cancer is caused by genetic changes acquired during an individual's lifetime. Some families have more cancers than would be expected by chance; however, the ages or types of cancer are not consistent with a known genetic mutation or known genetic mutations have been ruled out. This type of familial cancer is thought to be due to a combination of multiple genetic, environmental, hormonal, and lifestyle factors. While this combination of factors likely increases the risk of cancer, the exact source of this risk is not currently identifiable or testable.  We discussed that 5 - 10% of breast cancer is hereditary, with most cases associated with BRCA mutations.  There are other genes that can be associated with hereditary breast cancer syndromes.  These include ATM, PALB2 and CHEK2.  We discussed that testing is beneficial for several reasons including knowing how to follow individuals after completing their treatment, identifying whether potential treatment options such as PARP  inhibitors would be beneficial, and understand if other family members could be at risk for cancer and allow them to undergo genetic testing.   We reviewed the characteristics, features and inheritance patterns of hereditary cancer syndromes. We also discussed genetic testing, including the appropriate family members to test, the process of testing, insurance coverage and turn-around-time for results. We discussed the implications of a negative, positive, carrier and/or variant of uncertain significant result. Joanne Morris  was offered a common  hereditary cancer panel (36+ genes) and an expanded pan-cancer panel (70+ genes). Joanne Morris was informed of the benefits and limitations of each panel, including that expanded pan-cancer panels contain genes that do not have clear management guidelines at this point in time.  We also discussed that as the number of genes included on a panel increases, the chances of variants of uncertain significance increases. Joanne Morris decided to pursue genetic testing for the CancerNext-Expanded+RNA gene panel.   The CancerNext-Expanded gene panel offered by [facility] and includes sequencing, rearrangement, and RNA analysis for the following 77 genes: AIP, ALK, APC, ATM, BAP1, BARD1, BMPR1A, BRCA1, BRCA2, BRIP1, CDC73, CDH1, CDK4, CDKN1B, CDKN2A, CEBPA, CHEK2, CTNNA1, DDX41, DICER1, ETV6, FH, FLCN, GATA2, LZTR1, MAX, MBD4, MEN1, MET, MLH1, MSH2, MSH3, MSH6, MUTYH, NF1, NF2, NTHL1, PALB2, PHOX2B, PMS2, POT1, PRKAR1A, PTCH1, PTEN, RAD51C, RAD51D, RB1, RET, RPS20, RUNX1, SDHA, SDHAF2, SDHB, SDHC, SDHD, SMAD4, SMARCA4, SMARCB1, SMARCE1, STK11, SUFU, TMEM127, TP53, TSC1, TSC2, VHL, and WT1 (sequencing and deletion/duplication); AXIN2, CTNNA1, DDX41, EGFR, HOXB13, KIT, MBD4, MITF, MSH3, PDGFRA, POLD1 and POLE (sequencing only); EPCAM and GREM1 (deletion/duplication only). RNA data is routinely analyzed for use in variant interpretation for all genes.   Based on Joanne Morris's personal  and family history of cancer, she meets medical criteria for genetic testing. Despite that she meets criteria, she may still have an out of pocket cost. We discussed that if her out of pocket cost for testing is over $100, the laboratory will call and confirm whether she wants to proceed with testing.  If the out of pocket cost of testing is less than $100 she will be billed by the genetic testing laboratory.   PLAN: After considering the risks, benefits, and limitations, Joanne Morris provided informed consent to pursue genetic testing and the blood sample was sent to [facility] for analysis of the CancerNext+RNAinsight. Results should be available within approximately 2-3 weeks' time, at which point they will be disclosed by telephone to Joanne Morris, as will any additional recommendations warranted by these results. Joanne Morris will receive a summary of her genetic counseling visit and a copy of her results once available. This information will also be available in Epic.   Lastly, we encouraged Joanne Morris to remain in contact with cancer genetics annually so that we can continuously update the family history and inform her of any changes in cancer genetics and testing that may be of benefit for this family.   Joanne Morris questions were answered to her satisfaction today. Our contact information was provided should additional questions or concerns arise. Thank you for the referral and allowing us  to share in the care of your patient.   Tamora Huneke P. Perri, MS, CGC Licensed, Patent attorney [email or web address] phone: [phone]  In total, 40 minutes were spent on the date of the encounter in service to the patient including preparation, face-to-face consultation, documentation and care coordination.  The patient brought her daughter. Drs. Lanny Stalls, and/or Gudena were available for questions, if needed..     _______________________________________________________________________ For Office Staff:  Number of people involved in session: 2 Was an Intern/ student involved with case: no

## 2023-08-13 NOTE — Assessment & Plan Note (Signed)
 08/11/2023:Screening mammogram detected left breast mass upper outer quadrant 1.9 cm by ultrasound axilla negative biopsy: Grade 2 IDC triple negative Ki-67 70%  Pathology and radiology counseling: Discussed with the patient, the details of pathology including the type of breast cancer,the clinical staging, the significance of ER, PR and HER-2/neu receptors and the implications for treatment. After reviewing the pathology in detail, we proceeded to discuss the different treatment options between surgery, radiation, chemotherapy, antiestrogen therapies.  Treatment plan: Breast conserving surgery with sentinel lymph node biopsy Adjuvant chemotherapy with CMF x 6 cycles (patient has cardiac disease, low performance status, diabetes) Adjuvant radiation therapy  Chemotherapy Counseling: I discussed the risks and benefits of chemotherapy including the risks of nausea/ vomiting, risk of infection from low WBC count, fatigue due to chemo or anemia, bruising or bleeding due to low platelets, mouth sores, loss/ change in taste and decreased appetite. Liver and kidney function will be monitored through out chemotherapy as abnormalities in liver and kidney function may be a side effect of treatment.  Risk of permanent bone marrow dysfunction and leukemia due to chemo were also discussed.  Return to clinic after surgery to finalize adjuvant treatment plan.

## 2023-08-13 NOTE — Progress Notes (Signed)
 Subjective:   Joanne Morris is a 73 y.o. who presents for a Medicare Wellness preventive visit.  As a reminder, Annual Wellness Visits don't include a physical exam, and some assessments may be limited, especially if this visit is performed virtually. We may recommend an in-person follow-up visit with your provider if needed.  Visit Complete: Virtual I connected with  Joanne Morris on 08/13/23 by a audio enabled telemedicine application and verified that I am speaking with the correct person using two identifiers.  Patient Location: Home  Provider Location: Home Office  I discussed the limitations of evaluation and management by telemedicine. The patient expressed understanding and agreed to proceed.  Vital Signs: Because this visit was a virtual/telehealth visit, some criteria may be missing or patient reported. Any vitals not documented were not able to be obtained and vitals that have been documented are patient reported.  VideoDeclined- This patient declined Librarian, academic. Therefore the visit was completed with audio only.  Persons Participating in Visit: Patient.  AWV Questionnaire: No: Patient Medicare AWV questionnaire was not completed prior to this visit.  Cardiac Risk Factors include: advanced age (>78men, >70 women);hypertension;diabetes mellitus;dyslipidemia     Objective:    Today's Vitals   08/13/23 0847  Weight: 143 lb (64.9 kg)  Height: 4' 11 (1.499 m)   Body mass index is 28.88 kg/m.     08/13/2023    8:52 AM 08/05/2022    2:56 PM 08/10/2021    8:59 AM 09/14/2020    3:10 PM 08/18/2018    9:22 AM 08/01/2018    6:00 AM 11/06/2017    7:57 PM  Advanced Directives  Does Patient Have a Medical Advance Directive? Yes Yes No No No No No   Type of Estate agent of [place];Living will Healthcare Power of [place];Living will       Copy of Healthcare Power of Attorney in Chart? No - copy requested No - copy requested        Would patient like information on creating a medical advance directive?   No - Patient declined Yes (MAU/Ambulatory/Procedural Areas - Information given) No - Patient declined  No - Patient declined       Data saved with a previous flowsheet row definition    Current Medications (verified) Outpatient Encounter Medications as of 08/13/2023  Medication Sig   acetaminophen  (TYLENOL ) 650 MG CR tablet Take 1 tablet (650 mg total) by mouth every evening. FOR PAIN.   amLODipine  (NORVASC ) 10 MG tablet TAKE ONE TABLET BY MOUTH DAILY AT 9AM (VIAL)   aspirin  EC 81 MG tablet Take 81 mg by mouth daily.   busPIRone  (BUSPAR ) 5 MG tablet TAKE ONE TABLET (5MG  TOTAL) BY MOUTH TWICE DAILY   CALCIUM -VITAMIN D PO Take 1 tablet by mouth daily.   cilostazol  (PLETAL ) 100 MG tablet Take 1 tablet (100 mg total) by mouth 2 (two) times daily.   dapagliflozin  propanediol (FARXIGA ) 10 MG TABS tablet Take 1 tablet (10 mg total) by mouth in the morning.   diclofenac  Sodium (VOLTAREN ) 1 % GEL APPLY 2 GRAMS TOPICALLY FOUR TIMES DAILY   diphenoxylate -atropine  (LOMOTIL ) 2.5-0.025 MG tablet Take 1 tablet by mouth 4 (four) times daily as needed for diarrhea or loose stools (Max dose of 8 tabs in 24 hours).   glipiZIDE  (GLUCOTROL  XL) 5 MG 24 hr tablet Take 1 tablet (5 mg total) by mouth daily with breakfast.   hydrALAZINE  (APRESOLINE ) 50 MG tablet Take 1 tablet (50 mg total) by  mouth 3 (three) times daily.   HYDROcodone -Acetaminophen  7.5-300 MG TABS Take 1 tablet by mouth daily as needed. After eating.   levocetirizine (XYZAL ) 5 MG tablet Take 1 tablet (5 mg total) by mouth daily as needed for allergies.   LINZESS  145 MCG CAPS capsule TAKE ONE CAPSULE ( TOTAL) BY MOUTH EVERY DAY   metoprolol  tartrate (LOPRESSOR ) 100 MG tablet TAKE ONE TABLET BY MOUTH TWICE DAILY @9AM -5PM   niacin  (VITAMIN B3) 500 MG ER tablet Take 1 tablet (500 mg total) by mouth daily.   polyethylene glycol powder (GLYCOLAX /MIRALAX ) 17 GM/SCOOP powder  Take 17 g by mouth daily as needed for constipation.   pravastatin  (PRAVACHOL ) 40 MG tablet TAKE ONE TABLET BY MOUTH DAILY AT 5PM (VIAL)   sertraline  (ZOLOFT ) 100 MG tablet TAKE ONE TABLET BY MOUTH DAILY AT 9AM IN ADDITION TO 50 MG TABLET (VIAL)   sertraline  (ZOLOFT ) 50 MG tablet TAKE ONE TABLET BY MOUTH DAILY AT 5PM EVERY EVENING   valsartan  (DIOVAN ) 320 MG tablet TAKE ONE TABLET BY MOUTH DAILY AT 9AM   zolpidem  (AMBIEN  CR) 12.5 MG CR tablet Take 1 tablet (12.5 mg total) by mouth at bedtime.   HYDROcodone -Acetaminophen  7.5-300 MG TABS Take 1 tablet by mouth daily as needed. After eating. (Patient not taking: Reported on 08/13/2023)   No facility-administered encounter medications on file as of 08/13/2023.    Allergies (verified) Patient has no known allergies.   History: Past Medical History:  Diagnosis Date   Abdominal pain 10/11/2012   Acute kidney injury superimposed on CKD (HCC) 08/01/2018   Anemia 05/31/2011   Angina    Anxiety    Arthritis    knees (07/20/2013)   Atypical chest pain 05/31/2011   Carotid stenosis, asymptomatic 04/18/2014   Claudication (HCC)    COVID-19 virus infection 08/01/2018   Depression    GERD (gastroesophageal reflux disease)    Hematoma of neck 04/23/2014   High cholesterol    Hyperlipemia 05/31/2011   Hypokalemia 05/31/2011   Leucocytosis 05/31/2011   Myocardial infarction [facility]) 1990's   1   Neck pain on right side 10/04/2014   PAD (peripheral artery disease) (HCC)    Peripheral arterial disease (HCC) 06/08/2013   Peripheral arterial disease   Radiculopathy of leg 10/11/2012   Stroke (HCC)    mini stroke 1st then regular stroke, denies residual on 07/20/2013   Wears glasses    Past Surgical History:  Procedure Laterality Date   ABDOMINAL HYSTERECTOMY     ATHERECTOMY  10/05/2013   BALLOON ANGIOPLASTY, ARTERY  10/05/2013   DR LADONA   BREAST BIOPSY Left 08/05/2023   US  LT BREAST BX W LOC DEV 1ST LESION IMG BX SPEC US  GUIDE 08/05/2023 [facility]  MAMMOGRAPHY   CAROTID ENDARTERECTOMY     CORONARY ANGIOPLASTY WITH STENT PLACEMENT     1   ENDARTERECTOMY Left 02/07/2014   Procedure: ENDARTERECTOMY CAROTID;  Surgeon: Carlin FORBES Haddock, MD;  Location: [facility] OR;  Service: Vascular;  Laterality: Left;   ENDARTERECTOMY Right 04/18/2014   Procedure: ENDARTERECTOMY RIGHT CAROTID;  Surgeon: Carlin FORBES Haddock, MD;  Location: [facility] OR;  Service: Vascular;  Laterality: Right;   ENDARTERECTOMY N/A 04/24/2014   Procedure: IRRIGATION AND DEBRIDEMENT OF RIGHT NECK ;  Surgeon: Carlin FORBES Haddock, MD;  Location[facility] OR;  Service: Vascular;  Laterality: N/A;   ESOPHAGOGASTRODUODENOSCOPY N/A 10/13/2012   Procedure: ESOPHAGOGASTRODUODENOSCOPY (EGD);  Surgeon: Lupita FORBES Commander, MD;  Location: [facility] ENDOSCOPY;  Service: Endoscopy;  Laterality: N/A;   KENALOG  INJECTION Bilateral 08/22/2016  Procedure: KENALOG  INJECTION BILATERAL NECK;  Surgeon: Lowery Estefana RAMAN, DO;  Location: [facility];  Service: Plastics;  Laterality: Bilateral;   LOWER EXTREMITY ANGIOGRAM  07/20/2013   Unsuccessful attempt at crossing the CTO/notes 07/20/2013   LOWER EXTREMITY ANGIOGRAM N/A 07/06/2013   Procedure: LOWER EXTREMITY ANGIOGRAM;  Surgeon: Erick JONELLE Bergamo, MD;  Location: [facility] CATH LAB;  Service: Cardiovascular;  Laterality: N/A;   LOWER EXTREMITY ANGIOGRAM N/A 07/20/2013   Procedure: LOWER EXTREMITY ANGIOGRAM;  Surgeon: Erick JONELLE Bergamo, MD;  Location: [facility] CATH LAB;  Service: Cardiovascular;  Laterality: N/A;   LOWER EXTREMITY ANGIOGRAM N/A 10/05/2013   Procedure: LOWER EXTREMITY ANGIOGRAM;  Surgeon: Erick JONELLE Bergamo, MD;  Location: [facility] CATH LAB;  Service: Cardiovascular;  Laterality: N/A;   MASS EXCISION Right 08/22/2016   Procedure: EXCISION RIGHT NECK KELOID;  Surgeon: Lowery Estefana RAMAN, DO;  Location:  SURGERY CENTER;  Service: Plastics;  Laterality: Right;   MASS EXCISION Right 09/18/2020   Procedure: Excision of right neck keloid;  Surgeon: Lowery Estefana RAMAN, DO;   Location: [facility] OR;  Service: Plastics;  Laterality: Right;   PATCH ANGIOPLASTY Left 02/07/2014   Procedure: PATCH ANGIOPLASTY Carotid;  Surgeon: Carlin FORBES Haddock, MD;  Location: [facility] OR;  Service: Vascular;  Laterality: Left;   PATCH ANGIOPLASTY Right 04/18/2014   Procedure: PATCH ANGIOPLASTY USING HEMASHIELD 0.8cmx 7.6cm PATCH;  Surgeon: Carlin FORBES Haddock, MD;  Location: [facility] OR;  Service: Vascular;  Laterality: Right;   PERIPHERAL VASCULAR CATHETERIZATION N/A 07/05/2014   Procedure: Lower Extremity Angiography;  Surgeon: Gordy Bergamo, MD;  Location: [facility] INVASIVE CV LAB;  Service: Cardiovascular;  Laterality: N/A;   TUBAL LIGATION     No family history on file. Social History   Socioeconomic History   Marital status: Legally Separated    Spouse name: Not on file   Number of children: Not on file   Years of education: Not on file   Highest education level: Not on file  Occupational History   Not on file  Tobacco Use   Smoking status: Former    Current packs/day: 0.00    Average packs/day: 0.5 packs/day for 4.0 years (2.0 ttl pk-yrs)    Types: Cigarettes    Start date: 05/30/2005    Quit date: 05/30/2009    Years since quitting: 14.2   Smokeless tobacco: Never  Vaping Use   Vaping status: Never Used  Substance and Sexual Activity   Alcohol use: No    Alcohol/week: 0.0 standard drinks of alcohol   Drug use: No   Sexual activity: Yes    Birth control/protection: Post-menopausal  Other Topics Concern   Not on file  Social History Narrative   Not on file   Social Drivers of Health   Financial Resource Strain: Low Risk  (08/13/2023)   Overall Financial Resource Strain (CARDIA)    Difficulty of Paying Living Expenses: Not hard at all  Food Insecurity: No Food Insecurity (08/13/2023)   Hunger Vital Sign    Worried About Running Out of Food in the Last Year: Never true    Ran Out of Food in the Last Year: Never true  Transportation Needs: No Transportation Needs (08/13/2023)   PRAPARE -  Administrator, Civil Service (Medical): No    Lack of Transportation (Non-Medical): No  Physical Activity: Sufficiently Active (08/13/2023)   Exercise Vital Sign    Days of Exercise per Week: 5 days    Minutes of Exercise per Session: 30 min  Stress:  No Stress Concern Present (08/13/2023)   [employer] of Occupational Health - Occupational Stress Questionnaire    Feeling of Stress: Not at all  Social Connections: Moderately Isolated (08/13/2023)   Social Connection and Isolation Panel    Frequency of Communication with Friends and Family: More than three times a week    Frequency of Social Gatherings with Friends and Family: More than three times a week    Attends Religious Services: More than 4 times per year    Active Member of [employer] or Organizations: No    Attends Banker Meetings: Never    Marital Status: Separated    Tobacco Counseling Counseling given: Not Answered    Clinical Intake:  Pre-visit preparation completed: Yes  Pain : No/denies pain     BMI - recorded: 28.88 Nutritional Status: BMI 25 -29 Overweight Diabetes: Yes CBG done?: No Did pt. bring in CBG monitor from home?: No  Lab Results  Component Value Date   HGBA1C 6.6 (H) 10/22/2022   HGBA1C 6.2 (A) 05/01/2021   HGBA1C 5.8 (H) 08/01/2018     How often do you need to have someone help you when you read instructions, pamphlets, or other written materials from your doctor or pharmacy?: 1 - Never  Interpreter Needed?: No  Information entered by :: Ellouise Haws, LPN   Activities of Daily Living      08/13/2023    8:49 AM  In your present state of health, do you have any difficulty performing the following activities:  Hearing? 0  Vision? 0  Difficulty concentrating or making decisions? 0  Walking or climbing stairs? 0  Dressing or bathing? 0  Doing errands, shopping? 0  Preparing Food and eating ? N  Using the Toilet? N  In the past six months, have you accidently  leaked urine? N  Do you have problems with loss of bowel control? N  Managing your Medications? N  Managing your Finances? N  Housekeeping or managing your Housekeeping? N    Patient Care Team: Lucius Krabbe, NP as PCP - General (Family Medicine) Tye Leach, NP as Nurse Practitioner [name] [name] [name], MD as Consulting Physician (Vascular Surgery) [name], MD as Attending Physician (Family Medicine) [name], RN as Oncology Nurse Navigator Aron Shoulders, MD as Consulting Physician (General Surgery) Odean Potts, MD as Consulting Physician (Hematology and Oncology) Shannon Agent, MD as Consulting Physician (Radiation Oncology)  I have updated your Care Teams any recent Medical Services you may have received from other providers in the past year.     Assessment:   This is a routine wellness examination for [place].  Hearing/Vision screen Hearing Screening - Comments:: Pt denies any hearing issues  Vision Screening - Comments:: Wears rx glasses - up to date with routine eye exams with  [facility]    Goals Addressed   None    Depression Screen     08/13/2023    8:54 AM 04/23/2023    1:21 PM 01/01/2023   10:59 AM 08/05/2022    2:55 PM 06/18/2022    9:09 AM 03/19/2022    9:31 AM 12/18/2021    9:41 AM  PHQ 2/9 Scores  PHQ - 2 Score 0 0 0 0 0 0 0  PHQ- 9 Score  0 0 0 0 0 0    Fall Risk     08/13/2023    8:53 AM 08/05/2022    2:57 PM 03/19/2022    9:33 AM 08/10/2021    9:00  AM 02/27/2021    9:02 AM  Fall Risk   Falls in the past year? 0 0 0 0 0  Number falls in past yr: 0 0 0 0   Injury with Fall? 0 0 0 0   Risk for fall due to : No Fall Risks Impaired balance/gait;Impaired vision No Fall Risks Impaired vision   Follow up Falls prevention discussed Falls prevention discussed Falls evaluation completed;Education provided Falls prevention discussed       Data saved with a previous flowsheet row definition    MEDICARE RISK AT HOME:  Medicare Risk at  Home Any stairs in or around the home?: No If so, are there any without handrails?: No Home free of loose throw rugs in walkways, pet beds, electrical cords, etc?: Yes Adequate lighting in your home to reduce risk of falls?: Yes Life alert?: No Use of a cane, walker or w/c?: No Grab bars in the bathroom?: No Shower chair or bench in shower?: No Elevated toilet seat or a handicapped toilet?: No  TIMED UP AND GO:  Was the test performed?  No  Cognitive Function: 6CIT completed        08/13/2023    8:55 AM 08/05/2022    3:05 PM 08/10/2021    9:01 AM  6CIT Screen  What Year? 0 points 0 points 0 points  What month? 0 points 0 points 0 points  What time? 0 points 0 points 0 points  Count back from 20 0 points 0 points 0 points  Months in reverse 0 points 4 points 2 points  Repeat phrase 0 points 0 points 10 points  Total Score 0 points 4 points 12 points    Immunizations Immunization History  Administered Date(s) Administered   Fluad Quad(high Dose 65+) 12/26/2020, 12/18/2021, 10/22/2022   Influenza-Unspecified 12/26/2020   Moderna Sars-Covid-2 Vaccination 03/06/2020, 04/03/2020   Pneumococcal Polysaccharide-23 07/21/2013    Screening Tests Health Maintenance  Topic Date Due   Diabetic kidney evaluation - Urine ACR  Never done   HEMOGLOBIN A1C  04/21/2023   OPHTHALMOLOGY EXAM  08/18/2023 (Originally 10/14/1960)   MAMMOGRAM  04/27/2024 (Originally 03/31/2014)   FOOT EXAM  02/10/2025 (Originally 03/20/2023)   INFLUENZA VACCINE  09/12/2023   Diabetic kidney evaluation - eGFR measurement  10/22/2023   Medicare Annual Wellness (AWV)  08/12/2024   Hepatitis B Vaccines  Aged Out   HPV VACCINES  Aged Out   Meningococcal B Vaccine  Aged Out   DTaP/Tdap/Td  Discontinued   Pneumococcal Vaccine: 50+ Years  Discontinued   DEXA SCAN  Discontinued   Colonoscopy  Discontinued   COVID-19 Vaccine  Discontinued   Hepatitis C Screening  Discontinued   Zoster Vaccines- Shingrix   Discontinued    Health Maintenance  Health Maintenance Due  Topic Date Due   Diabetic kidney evaluation - Urine ACR  Never done   HEMOGLOBIN A1C  04/21/2023   Health Maintenance Items Addressed: See Nurse Notes at the end of this note  Additional Screening:  Vision Screening: Recommended annual ophthalmology exams for early detection of glaucoma and other disorders of the eye. Would you like a referral to an eye doctor? No    Dental Screening: Recommended annual dental exams for proper oral hygiene  Community Resource Referral / Chronic Care Management: CRR required this visit?  No   CCM required this visit?  No   Plan:    I have personally reviewed and noted the following in the patient's chart:   Medical and social  history Use of alcohol, tobacco or illicit drugs  Current medications and supplements including opioid prescriptions. Patient is currently taking opioid prescriptions. Information provided to patient regarding non-opioid alternatives. Patient advised to discuss non-opioid treatment plan with their provider. Functional ability and status Nutritional status Physical activity Advanced directives List of other physicians Hospitalizations, surgeries, and ER visits in previous 12 months Vitals Screenings to include cognitive, depression, and falls Referrals and appointments  In addition, I have reviewed and discussed with patient certain preventive protocols, quality metrics, and best practice recommendations. A written personalized care plan for preventive services as well as general preventive health recommendations were provided to patient.   Ellouise VEAR Haws, LPN   03/20/7972   After Visit Summary: (MyChart) Due to this being a telephonic visit, the after visit summary with patients personalized plan was offered to patient via MyChart   Notes: Nothing significant to report at this time.

## 2023-08-13 NOTE — Progress Notes (Signed)
 [facility] CONSULT NOTE  Patient Care Team: Lucius Krabbe, NP as PCP - General (Family Medicine) Tye Leach, NP as Nurse Practitioner [name] [name] [name], MD as Consulting Physician (Vascular Surgery) [name], MD as Attending Physician (Family Medicine) [name], RN as Oncology Nurse Navigator Aron Shoulders, MD as Consulting Physician (General Surgery) Odean Potts, MD as Consulting Physician (Hematology and Oncology) Shannon Agent, MD as Consulting Physician (Radiation Oncology)  CHIEF COMPLAINTS/PURPOSE OF CONSULTATION:  Newly diagnosed breast cancer  HISTORY OF PRESENTING ILLNESS:  [name] is a 73 year old who had a screening mammogram detected left breast mass which on biopsy came back as a grade 2 invasive ductal carcinoma triple negative with a Ki-67 of 70%.  It measured 1.9 cm by ultrasound.  Axilla negative.  She was presented this morning to the multidisciplinary tumor board and she is here today to discuss treatment plan.  She had initial mammograms at [facility].   I reviewed her records extensively and collaborated the history with the patient.  SUMMARY OF ONCOLOGIC HISTORY: Oncology History  Malignant neoplasm of upper-outer quadrant of left breast in female, estrogen receptor negative (HCC)  08/11/2023 Initial Diagnosis   Screening mammogram detected left breast mass upper outer quadrant 1.9 cm by ultrasound axilla negative biopsy: Grade 2 IDC triple negative Ki-67 70%   08/13/2023 Cancer Staging   Staging form: Breast, AJCC 8th Edition - Clinical stage from 08/13/2023: Stage IB (cT1c, cN0, cM0, G2, ER-, PR-, HER2-) - Signed by Odean Potts, MD on 08/13/2023 Stage prefix: Initial diagnosis Histologic grading system: 3 grade system Laterality: Left Staged by: Pathologist and managing physician Stage used in treatment planning: Yes National guidelines used in treatment planning: Yes Type of national guideline used in  treatment planning: NCCN      MEDICAL HISTORY:  Past Medical History:  Diagnosis Date   Abdominal pain 10/11/2012   Acute kidney injury superimposed on CKD (HCC) 08/01/2018   Anemia 05/31/2011   Angina    Anxiety    Arthritis    knees (07/20/2013)   Atypical chest pain 05/31/2011   Carotid stenosis, asymptomatic 04/18/2014   Claudication (HCC)    COVID-19 virus infection 08/01/2018   Depression    GERD (gastroesophageal reflux disease)    Hematoma of neck 04/23/2014   High cholesterol    Hyperlipemia 05/31/2011   Hypokalemia 05/31/2011   Leucocytosis 05/31/2011   Myocardial infarction (HCC) 1990's   1   Neck pain on right side 10/04/2014   PAD (peripheral artery disease) (HCC)    Peripheral arterial disease (HCC) 06/08/2013   Peripheral arterial disease   Radiculopathy of leg 10/11/2012   Stroke (HCC)    mini stroke 1st then regular stroke, denies residual on 07/20/2013   Wears glasses     SURGICAL HISTORY: Past Surgical History:  Procedure Laterality Date   ABDOMINAL HYSTERECTOMY     ATHERECTOMY  10/05/2013   BALLOON ANGIOPLASTY, ARTERY  10/05/2013   DR LADONA   BREAST BIOPSY Left 08/05/2023   US  LT BREAST BX W LOC DEV 1ST LESION IMG BX SPEC US  GUIDE 08/05/2023 [facility] MAMMOGRAPHY   CAROTID ENDARTERECTOMY     CORONARY ANGIOPLASTY WITH STENT PLACEMENT     1   ENDARTERECTOMY Left 02/07/2014   Procedure: ENDARTERECTOMY CAROTID;  Surgeon: Carlin FORBES Haddock, MD;  Location[facility];  Service: Vascular;  Laterality: Left;   ENDARTERECTOMY Right 04/18/2014   Procedure: ENDARTERECTOMY RIGHT CAROTID;  Surgeon: Carlin FORBES Haddock, MD;  Location: [facility] OR;  Service: Vascular;  Laterality: Right;   ENDARTERECTOMY N/A 04/24/2014   Procedure: IRRIGATION AND DEBRIDEMENT OF RIGHT NECK ;  Surgeon: Carlin FORBES Haddock, MD;  Location: [facility] OR;  Service: Vascular;  Laterality: N/A;   ESOPHAGOGASTRODUODENOSCOPY N/A 10/13/2012   Procedure: ESOPHAGOGASTRODUODENOSCOPY (EGD);  Surgeon: Lupita FORBES Commander,  MD;  Location: [facility] ENDOSCOPY;  Service: Endoscopy;  Laterality: N/A;   KENALOG  INJECTION Bilateral 08/22/2016   Procedure: KENALOG  INJECTION BILATERAL NECK;  Surgeon: Lowery Estefana RAMAN, DO;  Location: [facility];  Service: Plastics;  Laterality: Bilateral;   LOWER EXTREMITY ANGIOGRAM  07/20/2013   Unsuccessful attempt at crossing the CTO/notes 07/20/2013   LOWER EXTREMITY ANGIOGRAM N/A 07/06/2013   Procedure: LOWER EXTREMITY ANGIOGRAM;  Surgeon: Erick JONELLE Bergamo, MD;  Location: [facility] CATH LAB;  Service: Cardiovascular;  Laterality: N/A;   LOWER EXTREMITY ANGIOGRAM N/A 07/20/2013   Procedure: LOWER EXTREMITY ANGIOGRAM;  Surgeon: Erick JONELLE Bergamo, MD;  Location: [facility] CATH LAB;  Service: Cardiovascular;  Laterality: N/A;   LOWER EXTREMITY ANGIOGRAM N/A 10/05/2013   Procedure: LOWER EXTREMITY ANGIOGRAM;  Surgeon: Erick JONELLE Bergamo, MD;  Location[facility] LAB;  Service: Cardiovascular;  Laterality: N/A;   MASS EXCISION Right 08/22/2016   Procedure: EXCISION RIGHT NECK KELOID;  Surgeon: Lowery Estefana RAMAN, DO;  Location: [facility];  Service: Plastics;  Laterality: Right;   MASS EXCISION Right 09/18/2020   Procedure: Excision of right neck keloid;  Surgeon: Lowery Estefana RAMAN, DO;  Location: [facility] OR;  Service: Plastics;  Laterality: Right;   PATCH ANGIOPLASTY Left 02/07/2014   Procedure: PATCH ANGIOPLASTY Carotid;  Surgeon: Carlin FORBES Haddock, MD;  Location: [facility] OR;  Service: Vascular;  Laterality: Left;   PATCH ANGIOPLASTY Right 04/18/2014   Procedure: PATCH ANGIOPLASTY USING HEMASHIELD 0.8cmx 7.6cm PATCH;  Surgeon: Carlin FORBES Haddock, MD;  Location: [facility] OR;  Service: Vascular;  Laterality: Right;   PERIPHERAL VASCULAR CATHETERIZATION N/A 07/05/2014   Procedure: Lower Extremity Angiography;  Surgeon: Gordy Bergamo, MD;  Location: [facility] INVASIVE CV LAB;  Service: Cardiovascular;  Laterality: N/A;   TUBAL LIGATION      SOCIAL HISTORY: Social History   Socioeconomic History   Marital status:  Legally Separated    Spouse name: Not on file   Number of children: Not on file   Years of education: Not on file   Highest education level: Not on file  Occupational History   Not on file  Tobacco Use   Smoking status: Former    Current packs/day: 0.00    Average packs/day: 0.5 packs/day for 4.0 years (2.0 ttl pk-yrs)    Types: Cigarettes    Start date: 05/30/2005    Quit date: 05/30/2009    Years since quitting: 14.2   Smokeless tobacco: Never  Vaping Use   Vaping status: Never Used  Substance and Sexual Activity   Alcohol use: No    Alcohol/week: 0.0 standard drinks of alcohol   Drug use: No   Sexual activity: Yes    Birth control/protection: Post-menopausal  Other Topics Concern   Not on file  Social History Narrative   Not on file   Social Drivers of Health   Financial Resource Strain: Low Risk  (08/13/2023)   Overall Financial Resource Strain (CARDIA)    Difficulty of Paying Living Expenses: Not hard at all  Food Insecurity: No Food Insecurity (08/13/2023)   Hunger Vital Sign    Worried About Running Out of Food in the Last Year: Never true    Ran Out of  Food in the Last Year: Never true  Transportation Needs: No Transportation Needs (08/13/2023)   PRAPARE - Administrator, Civil Service (Medical): No    Lack of Transportation (Non-Medical): No  Physical Activity: Sufficiently Active (08/13/2023)   Exercise Vital Sign    Days of Exercise per Week: 5 days    Minutes of Exercise per Session: 30 min  Stress: No Stress Concern Present (08/13/2023)   [employer] of Occupational Health - Occupational Stress Questionnaire    Feeling of Stress: Not at all  Social Connections: Moderately Isolated (08/13/2023)   Social Connection and Isolation Panel    Frequency of Communication with Friends and Family: More than three times a week    Frequency of Social Gatherings with Friends and Family: More than three times a week    Attends Religious Services: More than 4  times per year    Active Member of [employer] or Organizations: No    Attends Banker Meetings: Never    Marital Status: Separated  Intimate Partner Violence: Not At Risk (08/13/2023)   Humiliation, Afraid, Rape, and Kick questionnaire    Fear of Current or Ex-Partner: No    Emotionally Abused: No    Physically Abused: No    Sexually Abused: No    FAMILY HISTORY: No family history on file.  ALLERGIES:  has no known allergies.  MEDICATIONS:  Current Outpatient Medications  Medication Sig Dispense Refill   acetaminophen  (TYLENOL ) 650 MG CR tablet Take 1 tablet (650 mg total) by mouth every evening. FOR PAIN. 30 tablet 11   amLODipine  (NORVASC ) 10 MG tablet TAKE ONE TABLET BY MOUTH DAILY AT 9AM (VIAL) 90 tablet 3   aspirin  EC 81 MG tablet Take 81 mg by mouth daily.     busPIRone  (BUSPAR ) 5 MG tablet TAKE ONE TABLET (5MG  TOTAL) BY MOUTH TWICE DAILY 60 tablet 5   CALCIUM -VITAMIN D PO Take 1 tablet by mouth daily.     cilostazol  (PLETAL ) 100 MG tablet Take 1 tablet (100 mg total) by mouth 2 (two) times daily. 180 tablet 3   dapagliflozin  propanediol (FARXIGA ) 10 MG TABS tablet Take 1 tablet (10 mg total) by mouth in the morning. 90 tablet 3   diclofenac  Sodium (VOLTAREN ) 1 % GEL APPLY 2 GRAMS TOPICALLY FOUR TIMES DAILY 100 g 3   diphenoxylate -atropine  (LOMOTIL ) 2.5-0.025 MG tablet Take 1 tablet by mouth 4 (four) times daily as needed for diarrhea or loose stools (Max dose of 8 tabs in 24 hours). 30 tablet 1   glipiZIDE  (GLUCOTROL  XL) 5 MG 24 hr tablet Take 1 tablet (5 mg total) by mouth daily with breakfast. 90 tablet 3   hydrALAZINE  (APRESOLINE ) 50 MG tablet Take 1 tablet (50 mg total) by mouth 3 (three) times daily. 270 tablet 3   HYDROcodone -Acetaminophen  7.5-300 MG TABS Take 1 tablet by mouth daily as needed. After eating. 30 tablet 0   HYDROcodone -Acetaminophen  7.5-300 MG TABS Take 1 tablet by mouth daily as needed. After eating. (Patient not taking: Reported on 08/13/2023) 30  tablet 0   levocetirizine (XYZAL ) 5 MG tablet Take 1 tablet (5 mg total) by mouth daily as needed for allergies. 90 tablet 3   LINZESS  145 MCG CAPS capsule TAKE ONE CAPSULE ( TOTAL) BY MOUTH EVERY DAY 90 capsule 11   metoprolol  tartrate (LOPRESSOR ) 100 MG tablet TAKE ONE TABLET BY MOUTH TWICE DAILY @9AM -5PM 180 tablet 1   niacin  (VITAMIN B3) 500 MG ER tablet Take 1 tablet (500 mg  total) by mouth daily. 90 tablet 3   polyethylene glycol powder (GLYCOLAX /MIRALAX ) 17 GM/SCOOP powder Take 17 g by mouth daily as needed for constipation.     pravastatin  (PRAVACHOL ) 40 MG tablet TAKE ONE TABLET BY MOUTH DAILY AT 5PM (VIAL) 90 tablet 3   sertraline  (ZOLOFT ) 100 MG tablet TAKE ONE TABLET BY MOUTH DAILY AT 9AM IN ADDITION TO 50 MG TABLET (VIAL) 90 tablet 3   sertraline  (ZOLOFT ) 50 MG tablet TAKE ONE TABLET BY MOUTH DAILY AT 5PM EVERY EVENING 90 tablet 3   valsartan  (DIOVAN ) 320 MG tablet TAKE ONE TABLET BY MOUTH DAILY AT 9AM 100 tablet 0   zolpidem  (AMBIEN  CR) 12.5 MG CR tablet Take 1 tablet (12.5 mg total) by mouth at bedtime. 30 tablet 5   No current facility-administered medications for this visit.    REVIEW OF SYSTEMS:   Constitutional: Denies fevers, chills or abnormal night sweats Breast:  Denies any palpable lumps or discharge All other systems were reviewed with the patient and are negative.  PHYSICAL EXAMINATION: ECOG PERFORMANCE STATUS: 2 - Symptomatic, <50% confined to bed  Vitals:   08/13/23 1225  BP: 122/60  Pulse: 68  Resp: 18  Temp: 97.6 F (36.4 C)  SpO2: 100%   Filed Weights   08/13/23 1225  Weight: 143 lb (64.9 kg)    GENERAL:alert, no distress and comfortable    LABORATORY DATA:  I have reviewed the data as listed Lab Results  Component Value Date   WBC 8.2 08/13/2023   HGB 13.2 08/13/2023   HCT 41.8 08/13/2023   MCV 77.3 (L) 08/13/2023   PLT 302 08/13/2023   Lab Results  Component Value Date   NA 139 08/13/2023   K 4.9 08/13/2023   CL 103  08/13/2023   CO2 26 08/13/2023    RADIOGRAPHIC STUDIES: I have personally reviewed the radiological reports and agreed with the findings in the report.  ASSESSMENT AND PLAN:  Malignant neoplasm of upper-outer quadrant of left breast in female, estrogen receptor negative (HCC) 08/11/2023:Screening mammogram detected left breast mass upper outer quadrant 1.9 cm by ultrasound axilla negative biopsy: Grade 2 IDC triple negative Ki-67 70%  Pathology and radiology counseling: Discussed with the patient, the details of pathology including the type of breast cancer,the clinical staging, the significance of ER, PR and HER-2/neu receptors and the implications for treatment. After reviewing the pathology in detail, we proceeded to discuss the different treatment options between surgery, radiation, chemotherapy, antiestrogen therapies.  Treatment plan: Breast conserving surgery with sentinel lymph node biopsy Adjuvant chemotherapy with CMF x 6 cycles (patient has cardiac disease, low performance status, diabetes) Adjuvant radiation therapy  Chemotherapy Counseling: I discussed the risks and benefits of chemotherapy including the risks of nausea/ vomiting, risk of infection from low WBC count, fatigue due to chemo or anemia, bruising or bleeding due to low platelets, mouth sores, loss/ change in taste and decreased appetite. Liver and kidney function will be monitored through out chemotherapy as abnormalities in liver and kidney function may be a side effect of treatment.  Risk of permanent bone marrow dysfunction and leukemia due to chemo were also discussed.  Return to clinic after surgery to finalize adjuvant treatment plan.      All questions were answered. The patient knows to call the clinic with any problems, questions or concerns.    Viinay K Hydia Copelin, MD 08/13/23

## 2023-08-13 NOTE — Therapy (Addendum)
 OUTPATIENT PHYSICAL THERAPY BREAST CANCER BASELINE EVALUATION   Patient Name: Joanne Morris MRN: [id number] DOB:[birth year], 73 y.o., female Today's Date: 08/13/2023  END OF SESSION:  PT End of Session - 08/13/23 1440     Visit Number 1    Number of Visits 2    Date for PT Re-Evaluation 10/08/23    PT Start Time 1333    PT Stop Time 1356   Also saw the pt from 1420-1430 for a total of 33 min   PT Time Calculation (min) 23 min    Activity Tolerance Patient tolerated treatment well    Behavior During Therapy [facility] for tasks assessed/performed          Past Medical History:  Diagnosis Date   Abdominal pain 10/11/2012   Acute kidney injury superimposed on CKD (HCC) 08/01/2018   Anemia 05/31/2011   Angina    Anxiety    Arthritis    knees (07/20/2013)   Atypical chest pain 05/31/2011   Carotid stenosis, asymptomatic 04/18/2014   Claudication (HCC)    COVID-19 virus infection 08/01/2018   Depression    GERD (gastroesophageal reflux disease)    Hematoma of neck 04/23/2014   High cholesterol    Hyperlipemia 05/31/2011   Hypokalemia 05/31/2011   Leucocytosis 05/31/2011   Myocardial infarction (HCC) 1990's   1   Neck pain on right side 10/04/2014   PAD (peripheral artery disease) (HCC)    Peripheral arterial disease (HCC) 06/08/2013   Peripheral arterial disease   Radiculopathy of leg 10/11/2012   Stroke (HCC)    mini stroke 1st then regular stroke, denies residual on 07/20/2013   Wears glasses    Past Surgical History:  Procedure Laterality Date   ABDOMINAL HYSTERECTOMY     ATHERECTOMY  10/05/2013   BALLOON ANGIOPLASTY, ARTERY  10/05/2013   DR LADONA   BREAST BIOPSY Left 08/05/2023   US  LT BREAST BX W LOC DEV 1ST LESION IMG BX SPEC US  GUIDE 08/05/2023 [facility] MAMMOGRAPHY   CAROTID ENDARTERECTOMY     CORONARY ANGIOPLASTY WITH STENT PLACEMENT     1   ENDARTERECTOMY Left 02/07/2014   Procedure: ENDARTERECTOMY CAROTID;  Surgeon: Carlin FORBES Haddock, MD;  Location: [facility] OR;   Service: Vascular;  Laterality: Left;   ENDARTERECTOMY Right 04/18/2014   Procedure: ENDARTERECTOMY RIGHT CAROTID;  Surgeon: Carlin FORBES Haddock, MD;  Location: [facility] OR;  Service: Vascular;  Laterality: Right;   ENDARTERECTOMY N/A 04/24/2014   Procedure: IRRIGATION AND DEBRIDEMENT OF RIGHT NECK ;  Surgeon: Carlin FORBES Haddock, MD;  Location: [facility] OR;  Service: Vascular;  Laterality: N/A;   ESOPHAGOGASTRODUODENOSCOPY N/A 10/13/2012   Procedure: ESOPHAGOGASTRODUODENOSCOPY (EGD);  Surgeon: Lupita FORBES Commander, MD;  Location: [facility] ENDOSCOPY;  Service: Endoscopy;  Laterality: N/A;   KENALOG  INJECTION Bilateral 08/22/2016   Procedure: KENALOG  INJECTION BILATERAL NECK;  Surgeon: Lowery Estefana RAMAN, DO;  Location: [facility];  Service: Plastics;  Laterality: Bilateral;   LOWER EXTREMITY ANGIOGRAM  07/20/2013   Unsuccessful attempt at crossing the CTO/notes 07/20/2013   LOWER EXTREMITY ANGIOGRAM N/A 07/06/2013   Procedure: LOWER EXTREMITY ANGIOGRAM;  Surgeon: Erick JONELLE LADONA, MD;  Location: [facility] CATH LAB;  Service: Cardiovascular;  Laterality: N/A;   LOWER EXTREMITY ANGIOGRAM N/A 07/20/2013   Procedure: LOWER EXTREMITY ANGIOGRAM;  Surgeon: Erick JONELLE LADONA, MD;  Location: [facility] CATH LAB;  Service: Cardiovascular;  Laterality: N/A;   LOWER EXTREMITY ANGIOGRAM N/A 10/05/2013   Procedure: LOWER EXTREMITY ANGIOGRAM;  Surgeon: Erick JONELLE LADONA, MD;  Location: [facility] CATH LAB;  Service: Cardiovascular;  Laterality: N/A;   MASS EXCISION Right 08/22/2016   Procedure: EXCISION RIGHT NECK KELOID;  Surgeon: Lowery Estefana RAMAN, DO;  Location: [facility];  Service: Plastics;  Laterality: Right;   MASS EXCISION Right 09/18/2020   Procedure: Excision of right neck keloid;  Surgeon: Lowery Estefana RAMAN, DO;  Location: [facility] OR;  Service: Plastics;  Laterality: Right;   PATCH ANGIOPLASTY Left 02/07/2014   Procedure: PATCH ANGIOPLASTY Carotid;  Surgeon: Carlin FORBES Haddock, MD;  Location: [facility] OR;  Service: Vascular;  Laterality:  Left;   PATCH ANGIOPLASTY Right 04/18/2014   Procedure: PATCH ANGIOPLASTY USING HEMASHIELD 0.8cmx 7.6cm PATCH;  Surgeon: Carlin FORBES Haddock, MD;  Location: [facility] OR;  Service: Vascular;  Laterality: Right;   PERIPHERAL VASCULAR CATHETERIZATION N/A 07/05/2014   Procedure: Lower Extremity Angiography;  Surgeon: Gordy Bergamo, MD;  Location: [facility] INVASIVE CV LAB;  Service: Cardiovascular;  Laterality: N/A;   TUBAL LIGATION     Patient Active Problem List   Diagnosis Date Noted   Malignant neoplasm of upper-outer quadrant of left breast in female, estrogen receptor negative (HCC) 08/11/2023   Primary osteoarthritis of right knee 04/25/2023   Mass of upper outer quadrant of left breast 04/23/2023   Diarrhea 11/07/2022   Generalized anxiety disorder 06/18/2022   Chronic allergic rhinitis 03/19/2022   Drug-induced constipation 12/18/2021   Stage 3a chronic kidney disease (HCC) 05/01/2021   Chronic pain syndrome 12/26/2020   Insomnia 12/26/2020   Keloid scar 09/14/2019   Aortic valve sclerosis 01/04/2019   H/O heart artery stent 01/04/2019   Bilateral carotid artery stenosis 12/10/2017   H/O carotid endarterectomy 12/10/2017   Old MI (myocardial infarction) 12/10/2017   PAD (peripheral artery disease) (HCC) 07/20/2013   Dyslipidemia 10/11/2012   Depression 10/11/2012   CAD (coronary artery disease) 05/31/2011   HTN (hypertension) 05/31/2011   DM2 (diabetes mellitus, type 2) (HCC) 05/31/2011    REFERRING PROVIDER: Dr. Jina Nephew  REFERRING DIAG: Left breast cancer  THERAPY DIAG:  Malignant neoplasm of upper-outer quadrant of left breast in female, estrogen receptor negative (HCC)  Abnormal posture  Difficulty in walking, not elsewhere classified  Rationale for Evaluation and Treatment: Rehabilitation  ONSET DATE: 07/03/2023  SUBJECTIVE:                                                                                                                                                                                            SUBJECTIVE STATEMENT: Patient reports she is here today to be seen by her medical team for her newly diagnosed left breast cancer.   PERTINENT HISTORY:  Patient was diagnosed on 07/03/2023 with  left grade 2 invasive ductal carcinoma breast cancer. It measures 1.9 cm and is located in the upper outer quadrant. It is triple negative with a Ki67 of 70%. She ambulates with a cane and has a cardiac stent.   PATIENT GOALS:   reduce lymphedema risk and learn post op HEP.   PAIN:  Are you having pain? No  PRECAUTIONS: Active CA Fall  RED FLAGS: None   HAND DOMINANCE: left  WEIGHT BEARING RESTRICTIONS: No  FALLS:  Has patient fallen in last 6 months? 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  COGNITION: Overall cognitive status: Within functional limits for tasks assessed    POSTURE:  Forward head and rounded shoulders posture  UPPER EXTREMITY AROM/PROM:  A/PROM RIGHT   eval   Shoulder extension 41  Shoulder flexion 138  Shoulder abduction 149  Shoulder internal rotation 43  Shoulder external rotation 77    (Blank rows = not tested)  A/PROM LEFT   eval  Shoulder extension 37  Shoulder flexion 127  Shoulder abduction 142  Shoulder internal rotation 44  Shoulder external rotation 80    (Blank rows = not tested)   UPPER EXTREMITY STRENGTH: WFL  LYMPHEDEMA ASSESSMENTS (in cm):   LANDMARK RIGHT   eval  10 cm proximal to olecranon process 29.5  Olecranon process 24.1  10 cm proximal to ulnar styloid process 21.1  Just proximal to ulnar styloid process 16  Across hand at thumb web space 19.5  At base of 2nd digit 7   (Blank rows = not tested)  LANDMARK LEFT   eval  10 cm proximal to olecranon process 28.5  Olecranon process 25.1  10 cm proximal to ulnar styloid process 22  Just proximal to ulnar styloid process 16.3  Across hand at thumb web space 19.5  At base of 2nd digit 6.9  (Blank rows = not tested)  L-DEX LYMPHEDEMA SCREENING:  The patient was assessed using the L-Dex machine today to produce a lymphedema index baseline score. The patient will be reassessed on a regular basis (typically every 3 months) to obtain new L-Dex scores. If the score is > 6.5 points away from his/her baseline score indicating onset of subclinical lymphedema, it will be recommended to wear a compression garment for 4 weeks, 12 hours per day and then be reassessed. If the score continues to be > 6.5 points from baseline at reassessment, we will initiate lymphedema treatment. Assessing in this manner has a 95% rate of preventing clinically significant lymphedema.   L-DEX FLOWSHEETS - 08/13/23 1400       L-DEX LYMPHEDEMA SCREENING   Measurement Type Unilateral    L-DEX MEASUREMENT EXTREMITY Upper Extremity    POSITION  Standing    DOMINANT SIDE Left    At Risk Side Left    BASELINE SCORE (UNILATERAL) -2.7          QUICK DASH SURVEY:  Joanne Morris - 08/13/23 0001     Open a tight or new jar No difficulty    Do heavy household chores (wash walls, wash floors) No difficulty    Carry a shopping bag or briefcase No difficulty    Wash your back No difficulty    Use a knife to  cut food No difficulty    Recreational activities in which you take some force or impact through your arm, shoulder, or hand (golf, hammering, tennis) No difficulty    During the past week, to what extent has your arm, shoulder or hand problem interfered with your normal social activities with family, friends, neighbors, or groups? Not at all    During the past week, to what extent has your arm, shoulder or hand problem limited your work or  other regular daily activities Not at all    Arm, shoulder, or hand pain. None    Tingling (pins and needles) in your arm, shoulder, or hand None    Difficulty Sleeping No difficulty    DASH Score 0 %           PATIENT EDUCATION:  Education details: Time spent educating patient on aspects of self-care to maximize post op recovery. Patient was educated on where and how to get a post op compression bra to use to reduce post op edema. Patient was also educated on the use of SOZO screenings and surveillance principles for early identification of lymphedema onset. She was instructed to use the post op pillow in the axilla for pressure and pain relief. Patient educated on lymphedema risk reduction and post op shoulder/posture HEP. Person educated: Patient Education method: Explanation, Demonstration, Handout Education comprehension: Patient verbalized understanding and returned demonstration  HOME EXERCISE PROGRAM: Patient was instructed today in a home exercise program today for post op shoulder range of motion. These included active assist shoulder flexion in sitting, scapular retraction, wall walking with shoulder abduction, and hands behind head external rotation.  She was encouraged to do these twice a day, holding 3 seconds and repeating 5 times when permitted by her physician.   ASSESSMENT:  CLINICAL IMPRESSION: Patient was diagnosed on 07/03/2023 with left grade 2 invasive ductal carcinoma breast cancer. It measures 1.9 cm and is located in the upper outer quadrant. It is triple negative with a Ki67 of 70%. She ambulates with a cane and has a cardiac stent. Her multidisciplinary medical team met prior to her assessments to determine a recommended treatment plan. She is planning to have a left lumpectomy and sentinel node biopsy followed by chemotherapy and radiation. She will benefit from a post op PT reassessment to determine needs and from L-Dex screens every 3 months for 2 years to detect  subclinical lymphedema.  Pt will benefit from skilled therapeutic intervention to improve on the following deficits: Decreased knowledge of precautions, impaired UE functional use, pain, decreased ROM, postural dysfunction.   PT treatment/interventions: ADL/self-care home management, pt/family education, therapeutic exercise  REHAB POTENTIAL: Good  CLINICAL DECISION MAKING: Stable/uncomplicated  EVALUATION COMPLEXITY: Low   GOALS: Goals reviewed with patient? YES  LONG TERM GOALS: (STG=LTG)    Name Target Date Goal status  1 Pt will be able to verbalize understanding of pertinent lymphedema risk reduction practices relevant to her dx specifically related to skin care.  Baseline:  No knowledge 08/13/2023 Achieved at eval  2 Pt will be able to return demo and/or verbalize understanding of the post op HEP related to regaining shoulder ROM. Baseline:  No knowledge 08/13/2023 Achieved at eval  3 Pt will be able to verbalize understanding of the importance of viewing the post op After Breast CA Class video for further lymphedema risk reduction education and therapeutic exercise.  Baseline:  No knowledge 08/13/2023 Achieved at eval  4 Pt will demo she has regained full shoulder ROM and function  post operatively compared to baselines.  Baseline: See objective measurements taken today. 10/08/2023     PLAN:  PT FREQUENCY/DURATION: EVAL and 1 follow up appointment.   PLAN FOR NEXT SESSION: will reassess 3-4 weeks post op to determine needs.   Patient will follow up at outpatient cancer rehab 3-4 weeks following surgery.  If the patient requires physical therapy at that time, a specific plan will be dictated and sent to the referring physician for approval. The patient was educated today on appropriate basic range of motion exercises to begin post operatively and the importance of viewing the After Breast Cancer class video following surgery.  Patient was educated today on lymphedema risk reduction  practices as it pertains to recommendations that will benefit the patient immediately following surgery.  She verbalized good understanding.    Physical Therapy Information for After Breast Cancer Surgery/Treatment:  Lymphedema is a swelling condition that you may be at risk for in your arm if you have lymph nodes removed from the armpit area.  After a sentinel node biopsy, the risk is approximately 5-9% and is higher after an axillary node dissection.  There is treatment available for this condition and it is not life-threatening.  Contact your physician or physical therapist with concerns. You may begin the 4 shoulder/posture exercises (see additional sheet) when permitted by your physician (typically a week after surgery).  If you have drains, you may need to wait until those are removed before beginning range of motion exercises.  A general recommendation is to not lift your arms above shoulder height until drains are removed.  These exercises should be done to your tolerance and gently.  This is not a no pain/no gain type of recovery so listen to your body and stretch into the range of motion that you can tolerate, stopping if you have pain.  If you are having immediate reconstruction, ask your plastic surgeon about doing exercises as he or she may want you to wait. We encourage you to view the After Breast Cancer class video following surgery.  You will learn information related to lymphedema risk, prevention and treatment and additional exercises to regain mobility following surgery.   While undergoing any medical procedure or treatment, try to avoid blood pressure being taken or needle sticks from occurring on the arm on the side of cancer.   This recommendation begins after surgery and continues for the rest of your life.  This may help reduce your risk of getting lymphedema (swelling in your arm). An excellent resource for those seeking information on lymphedema is the National Lymphedema  Network's web site. It can be accessed at www.lymphnet.org If you notice swelling in your hand, arm or breast at any time following surgery (even if it is many years from now), please contact your doctor or physical therapist to discuss this.  Lymphedema can be treated at any time but it is easier for you if it is treated early on.  If you feel like your shoulder motion is not returning to normal in a reasonable amount of time, please contact your surgeon or physical therapist.  [facility] [phone]. [address], [place] [address]  ABC CLASS After Breast Cancer Class  After Breast Cancer Class is a specially designed exercise class video to assist you in a safe recover after having breast cancer surgery.  In this video you will learn how to get back to full function whether your drains were just removed or  if you had surgery a month ago. The video can be viewed on this page: [email or web address].boyd-meyer.org/ or on YouTube here: https://youtu.az/[email or web address].  Class Goals  Understand specific stretches to improve the flexibility of you chest and shoulder. Learn ways to safely strengthen your upper body and improve your posture. Understand the warning signs of infection and why you may be at risk for an arm infection. Learn about Lymphedema and prevention.  ** You do not need to view this video until after surgery.  Drains should be removed to participate in the recommended exercises on the video.  Patient was instructed today in a home exercise program today for post op shoulder range of motion. These included active assist shoulder flexion in sitting, scapular retraction, wall walking with shoulder abduction, and hands behind head external rotation.  She was encouraged to do these twice a day, holding 3 seconds and repeating 5 times when permitted by her physician.  Eward Wonda Sharps,  [place] 08/13/23 3:34 PM

## 2023-08-14 ENCOUNTER — Institutional Professional Consult (permissible substitution): Admitting: Plastic Surgery

## 2023-08-19 ENCOUNTER — Telehealth: Payer: Self-pay

## 2023-08-19 DIAGNOSIS — C50412 Malignant neoplasm of upper-outer quadrant of left female breast: Secondary | ICD-10-CM

## 2023-08-19 NOTE — Telephone Encounter (Signed)
 [employer] 2021-05 - Specimen Collection Study to Evaluate Biomarkers in Subjects with Cancer    Per pt's request on Breast Clinic Day, her daughter was contacted regarding participation in the above study. She stated that her mother, Ms. Ellithorpe, is not interested in participating in research.  They were thanked for their time and consideration and were encouraged to contact research team with any questions or concerns.   Harbert Fitterer, Ph.D. Clinical Research Coordinator [phone] 08/19/2023 1:40 PM

## 2023-08-20 ENCOUNTER — Other Ambulatory Visit: Payer: Self-pay | Admitting: Family

## 2023-08-20 DIAGNOSIS — G894 Chronic pain syndrome: Secondary | ICD-10-CM

## 2023-08-20 NOTE — Telephone Encounter (Signed)
 Medication: Hydrocodone  Directions: Take 1 tablet by mouth daily as needed. After eating.,  Last given: 05/28/2023 Number refills: 0 Last o/v: 05/28/2023 Follow up: F/U 4 months. Labs: 08/13/2023  Please review refill request.

## 2023-08-20 NOTE — Telephone Encounter (Unsigned)
 Copied from CRM [id number]. Topic: Clinical - Medication Refill >> Aug 20, 2023 12:31 PM Viola F wrote: Medication: HYDROcodone -Acetaminophen  7.5-300 MG TABS [[id number]]  Has the patient contacted their pharmacy? Yes (Agent: If no, request that the patient contact the pharmacy for the refill. If patient does not wish to contact the pharmacy document the reason why and proceed with request.) (Agent: If yes, when and what did the pharmacy advise?)  This is the patient's preferred pharmacy:  [id number] [place], [place] - [address] AT [facility] [address] [place] [address] Phone: [phone] Fax: [phone]  [facility] - [place], [place] - [address] [place] [address] Phone: [phone] Fax: [phone]  [facility] [address] [place] - [address] [place] [address] Phone: [phone] Fax: [phone]  Is this the correct pharmacy for this prescription? Yes If no, delete pharmacy and type the correct one.   Has the prescription been filled recently? Yes  Is the patient out of the medication? Yes  Has the patient been seen for an appointment in the last year OR does the patient have an upcoming appointment? Yes  Can we respond through MyChart? Yes  Agent: Please be advised that Rx refills may take up to 3 business days. We ask that you follow-up with your pharmacy.

## 2023-08-21 ENCOUNTER — Telehealth: Payer: Self-pay | Admitting: *Deleted

## 2023-08-21 ENCOUNTER — Encounter: Payer: Self-pay | Admitting: *Deleted

## 2023-08-21 MED ORDER — HYDROCODONE-ACETAMINOPHEN 7.5-300 MG PO TABS: 1.0000 | ORAL_TABLET | Freq: Every day | ORAL | 0 refills | Status: DC | PRN

## 2023-08-21 NOTE — Telephone Encounter (Signed)
 Spoke with patient to follow up from [facility] 7/2 and assess navigation needs. Patient denies any questions or concerns at this time. Encouraged her to call should anything arise.

## 2023-08-22 ENCOUNTER — Institutional Professional Consult (permissible substitution): Admitting: Plastic Surgery

## 2023-08-25 ENCOUNTER — Encounter: Payer: Self-pay | Admitting: Genetic Counselor

## 2023-08-25 ENCOUNTER — Encounter: Payer: Self-pay | Admitting: General Practice

## 2023-08-25 ENCOUNTER — Telehealth: Payer: Self-pay | Admitting: Genetic Counselor

## 2023-08-25 DIAGNOSIS — Z1379 Encounter for other screening for genetic and chromosomal anomalies: Secondary | ICD-10-CM | POA: Insufficient documentation

## 2023-08-25 NOTE — Progress Notes (Signed)
 [facility] Multidisciplinary Clinic Spiritual Care Note  Met with Rock by phone following Breast Multidisciplinary Clinic to introduce Support Center team/resources.  She completed SDOH screening; results follow below.  SDOH Interventions    Flowsheet Row Clinical Support from 08/13/2023 in [facility] [place] Clinical Support from 08/05/2022 in [facility] [place]  SDOH Interventions    Food Insecurity Interventions Intervention Not Indicated Intervention Not Indicated  Housing Interventions Intervention Not Indicated Intervention Not Indicated  Transportation Interventions Intervention Not Indicated Intervention Not Indicated  Utilities Interventions Intervention Not Indicated Intervention Not Indicated  Alcohol Usage Interventions Intervention Not Indicated (Score <7) --  Financial Strain Interventions Intervention Not Indicated Intervention Not Indicated  Physical Activity Interventions Intervention Not Indicated Intervention Not Indicated  Stress Interventions Intervention Not Indicated Intervention Not Indicated  Social Connections Interventions Intervention Not Indicated Intervention Not Indicated  Health Literacy Interventions Intervention Not Indicated --    SDOH Screenings   Food Insecurity: No Food Insecurity (08/25/2023)  Housing: Low Risk  (08/25/2023)  Transportation Needs: No Transportation Needs (08/25/2023)  Utilities: Not At Risk (08/25/2023)  Alcohol Screen: Low Risk  (08/13/2023)  Depression (PHQ2-9): Low Risk  (08/25/2023)  Financial Resource Strain: Low Risk  (08/13/2023)  Physical Activity: Sufficiently Active (08/13/2023)  Social Connections: Moderately Isolated (08/13/2023)  Stress: No Stress Concern Present (08/13/2023)  Tobacco Use: Medium Risk (08/13/2023)  Health Literacy: Adequate Health Literacy (08/13/2023)     Chaplain and patient discussed common feelings and emotions when being diagnosed with cancer, and the  importance of support during treatment.  Chaplain informed patient of the support team and support services at [facility].  Chaplain provided contact information and encouraged patient to call with any questions or concerns.  Ms Baysinger reports good support from her daughter and states that she has no other needs at this time.  Follow up needed: No.Ms Bermingham has direct Spiritual Care number in case needs or questions arise.  [address] [place], [place], [facility] Pager [phone] Voicemail [phone]

## 2023-08-25 NOTE — Telephone Encounter (Signed)
 I contacted [name] to discuss her genetic testing results. No pathogenic variants were identified in the 77 genes analyzed. After disclosing her results, she requested I also call her daughter and explain her results. I then contacted her daughter, Joanne Morris, and disclosed the results to her as well. Detailed clinic note to follow.  The test report has been scanned into EPIC and is located under the Molecular Pathology section of the Results Review tab.  A portion of the result report is included below for reference.   Hester Forget, MS, [facility] Genetic Counselor [email or web address] (P) [phone]

## 2023-08-27 ENCOUNTER — Other Ambulatory Visit (HOSPITAL_COMMUNITY): Payer: Self-pay

## 2023-08-27 ENCOUNTER — Telehealth: Payer: Self-pay

## 2023-08-27 DIAGNOSIS — G894 Chronic pain syndrome: Secondary | ICD-10-CM

## 2023-08-27 NOTE — Telephone Encounter (Signed)
 Pharmacy Patient Advocate Encounter   Received notification from [employer] that prior authorization for HYDROcodone -Acetaminophen  7.5-300MG  tablets is required/requested.   Insurance verification completed.   The patient is insured through [facility] .   Per test claim:  ACETAMINOPHEN /CODEINE  is preferred by the insurance.  If suggested medication is appropriate, Please send in a new RX and discontinue this one. If not, please advise as to why it's not appropriate so that we may request a Prior Authorization. Please note, some preferred medications may still require a PA.  If the suggested medications have not been trialed and there are no contraindications to their use, the PA will not be submitted, as it will not be approved.

## 2023-08-28 NOTE — Telephone Encounter (Signed)
 Joanne Morris know her insurance is not covering the Hydrocodone  medication for pain any longer. Has she been paying cash for this? How much? This is the only med that goes to [employer] [facility] and I thought due to better pricing. Her insurance will cover Codeine which is a milder narcotic if she wants to try this, Joanne me know, thx.

## 2023-08-29 ENCOUNTER — Ambulatory Visit: Payer: Self-pay | Admitting: Genetic Counselor

## 2023-08-29 DIAGNOSIS — Z1379 Encounter for other screening for genetic and chromosomal anomalies: Secondary | ICD-10-CM

## 2023-08-29 NOTE — Progress Notes (Signed)
 HPI:  Joanne Morris was previously seen in the [facility] due to a personal and family history of cancer and concerns regarding a hereditary predisposition to cancer. Please refer to our prior cancer genetics clinic note for more information regarding our discussion, assessment and recommendations, at the time. Joanne Morris recent genetic test results were disclosed to her, as were recommendations warranted by these results. These results and recommendations are discussed in more detail below.  CANCER HISTORY:  Oncology History  Malignant neoplasm of upper-outer quadrant of left breast in female, estrogen receptor negative (HCC)  08/11/2023 Initial Diagnosis   Screening mammogram detected left breast mass upper outer quadrant 1.9 cm by ultrasound axilla negative biopsy: Grade 2 IDC triple negative Ki-67 70%   08/13/2023 Cancer Staging   Staging form: Breast, AJCC 8th Edition - Clinical stage from 08/13/2023: Stage IB (cT1c, cN0, cM0, G2, ER-, PR-, HER2-) - Signed by Odean Potts, MD on 08/13/2023 Stage prefix: Initial diagnosis Histologic grading system: 3 grade system Laterality: Left Staged by: Pathologist and managing physician Stage used in treatment planning: Yes National guidelines used in treatment planning: Yes Type of national guideline used in treatment planning: NCCN    Genetic Testing   [facility] CancerNext-Expanded Panel+RNA was Negative. Report date is 08/23/2023.   The CancerNext-Expanded gene panel offered by [facility] and includes sequencing, rearrangement, and RNA analysis for the following 77 genes: AIP, ALK, APC, ATM, AXIN2, BAP1, BARD1, BMPR1A, BRCA1, BRCA2, BRIP1, CDC73, CDH1, CDK4, CDKN1B, CDKN2A, CEBPA, CHEK2, CTNNA1, DDX41, DICER1, ETV6, FH, FLCN, GATA2, LZTR1, MAX, MBD4, MEN1, MET, MLH1, MSH2, MSH3, MSH6, MUTYH, NF1, NF2, NTHL1, PALB2, PHOX2B, PMS2, POT1, PRKAR1A, PTCH1, PTEN, RAD51C, RAD51D, RB1, RET, RPS20, RUNX1, SDHA, SDHAF2, SDHB, SDHC, SDHD, SMAD4,  SMARCA4, SMARCB1, SMARCE1, STK11, SUFU, TMEM127, TP53, TSC1, TSC2, VHL, and WT1 (sequencing and deletion/duplication); EGFR, HOXB13, KIT, MITF, PDGFRA, POLD1, and POLE (sequencing only); EPCAM and GREM1 (deletion/duplication only).       FAMILY HISTORY:  We obtained a detailed, 4-generation family history.  Significant diagnoses are listed below: Family History  Problem Relation Age of Onset   Cancer Mother        unknown form, d. < 16   Colon cancer Brother        d. > 50         The patient has a son and daughter who are cancer free. She has one full brother who has colon cancer.  Her mother died when she was very young of an unknown cancer.   The patient's father is also deceased. His family history is unknown.   Joanne Morris is unaware of previous family history of genetic testing for hereditary cancer risks.  GENETIC TEST RESULTS: Genetic testing reported out on August 23, 2023 through the CancerNext-Expanded+RNAinsight cancer panel found no pathogenic mutations. The CancerNext-Expanded gene panel offered by [facility] and includes sequencing, rearrangement, and RNA analysis for the following 77 genes: AIP, ALK, APC, ATM, BAP1, BARD1, BMPR1A, BRCA1, BRCA2, BRIP1, CDC73, CDH1, CDK4, CDKN1B, CDKN2A, CEBPA, CHEK2, CTNNA1, DDX41, DICER1, ETV6, FH, FLCN, GATA2, LZTR1, MAX, MBD4, MEN1, MET, MLH1, MSH2, MSH3, MSH6, MUTYH, NF1, NF2, NTHL1, PALB2, PHOX2B, PMS2, POT1, PRKAR1A, PTCH1, PTEN, RAD51C, RAD51D, RB1, RET, RPS20, RUNX1, SDHA, SDHAF2, SDHB, SDHC, SDHD, SMAD4, SMARCA4, SMARCB1, SMARCE1, STK11, SUFU, TMEM127, TP53, TSC1, TSC2, VHL, and WT1 (sequencing and deletion/duplication); AXIN2, CTNNA1, DDX41, EGFR, HOXB13, KIT, MBD4, MITF, MSH3, PDGFRA, POLD1 and POLE (sequencing only); EPCAM and GREM1 (deletion/duplication only). RNA data is routinely analyzed for  use in variant interpretation for all genes. The test report has been scanned into EPIC and is located under the Molecular Pathology  section of the Results Review tab.  A portion of the result report is included below for reference.     We discussed with Joanne Morris that because current genetic testing is not perfect, it is possible there may be a gene mutation in one of these genes that current testing cannot detect, but that chance is small.  We also discussed, that there could be another gene that has not yet been discovered, or that we have not yet tested, that is responsible for the cancer diagnoses in the family. It is also possible there is a hereditary cause for the cancer in the family that Joanne Morris did not inherit and therefore was not identified in her testing.  Therefore, it is important to remain in touch with cancer genetics in the future so that we can continue to offer Joanne Morris the most up to date genetic testing.   ADDITIONAL GENETIC TESTING: We discussed with Joanne Morris that her genetic testing was fairly extensive.  If there are genes identified to increase cancer risk that can be analyzed in the future, we would be happy to discuss and coordinate this testing at that time.    CANCER SCREENING RECOMMENDATIONS: Joanne Morris test result is considered negative (normal).  This means that we have not identified a hereditary cause for her personal and family history of cancer at this time. Most cancers happen by chance and this negative test suggests that her personal and family history of cancer may fall into this category.    Possible reasons for Joanne Morris's negative genetic test include:  1. There may be a gene mutation in one of these genes that current testing methods cannot detect but that chance is small.  2. There could be another gene that has not yet been discovered, or that we have not yet tested, that is responsible for the cancer diagnoses in the family.  3.  There may be no hereditary risk for cancer in the family. The cancers in Joanne Morris and/or her family may be sporadic/familial or due to other  genetic and environmental factors. 4. It is also possible there is a hereditary cause for the cancer in the family that Joanne Morris did not inherit.  Therefore, it is recommended she continue to follow the cancer management and screening guidelines provided by her oncology and primary healthcare provider. An individual's cancer risk and medical management are not determined by genetic test results alone. Overall cancer risk assessment incorporates additional factors, including personal medical history, family history, and any available genetic information that may result in a personalized plan for cancer prevention and surveillance  RECOMMENDATIONS FOR FAMILY MEMBERS:   Since she did not inherit a identifiable mutation in a cancer predisposition gene included on this panel, her children could not have inherited a known mutation from her in one of these genes. Individuals in this family might be at some increased risk of developing cancer, over the general population risk, simply due to the family history of cancer.  We recommended women in this family have a yearly mammogram beginning at age 33, or 41 years younger than the earliest onset of cancer, an annual clinical breast exam, and perform monthly breast self-exams. Women in this family should also have a gynecological exam as recommended by their primary provider. All family members should be referred for colonoscopy starting at age 33,  or 10 years younger than the earliest onset of cancer.  FOLLOW-UP: Lastly, we discussed with Joanne Morris that cancer genetics is a rapidly advancing field and it is possible that new genetic tests will be appropriate for her and/or her family members in the future. We encouraged her to remain in contact with cancer genetics on an annual basis so we can update her personal and family histories and let her know of advances in cancer genetics that may benefit this family.   Our contact number was provided. Joanne Morris  questions were answered to her satisfaction, and she knows she is welcome to call us  at anytime with additional questions or concerns.   Joanne Monte, MS, [facility] Licensed, Certified Genetic Counselor [email or web address]

## 2023-09-01 DIAGNOSIS — M1711 Unilateral primary osteoarthritis, right knee: Secondary | ICD-10-CM | POA: Diagnosis not present

## 2023-09-01 MED ORDER — ACETAMINOPHEN-CODEINE 300-60 MG PO TABS: 1.0000 | ORAL_TABLET | ORAL | 2 refills | Status: DC | PRN

## 2023-09-01 NOTE — Addendum Note (Signed)
 Addended by: Deniel Mcquiston on: 09/01/2023 09:29 AM   Modules accepted: Orders

## 2023-09-05 DIAGNOSIS — I10 Essential (primary) hypertension: Secondary | ICD-10-CM | POA: Diagnosis not present

## 2023-09-05 DIAGNOSIS — E785 Hyperlipidemia, unspecified: Secondary | ICD-10-CM | POA: Diagnosis not present

## 2023-09-05 DIAGNOSIS — I340 Nonrheumatic mitral (valve) insufficiency: Secondary | ICD-10-CM | POA: Diagnosis not present

## 2023-09-05 DIAGNOSIS — Z9889 Other specified postprocedural states: Secondary | ICD-10-CM | POA: Diagnosis not present

## 2023-09-05 DIAGNOSIS — I2510 Atherosclerotic heart disease of native coronary artery without angina pectoris: Secondary | ICD-10-CM | POA: Diagnosis not present

## 2023-09-10 ENCOUNTER — Other Ambulatory Visit: Payer: Self-pay | Admitting: General Surgery

## 2023-09-10 ENCOUNTER — Ambulatory Visit: Admitting: Family

## 2023-09-10 ENCOUNTER — Telehealth: Payer: Self-pay | Admitting: *Deleted

## 2023-09-10 ENCOUNTER — Telehealth: Payer: Self-pay

## 2023-09-10 ENCOUNTER — Encounter: Payer: Self-pay | Admitting: General Surgery

## 2023-09-10 DIAGNOSIS — C50412 Malignant neoplasm of upper-outer quadrant of left female breast: Secondary | ICD-10-CM

## 2023-09-10 NOTE — Telephone Encounter (Signed)
 Daughter called to ask who should fill out her FMLA paperwork, advised since she is seeing the surgeon first that is who should fill it out, also emailed her a calendar of her moms upcoming appointments

## 2023-09-10 NOTE — Telephone Encounter (Signed)
 I returned call, pt cancelled on 7/30 with PCP to fill out Home health forms. [facility] stated they will reach out to pt in regards.  Unable to complete form at this time.  Copied from CRM [id number]. Topic: General - Other >> Sep 09, 2023 11:51 AM Thersia BROCKS wrote: Reason for CRM: [facility] called in regarding some paperwork that was faxed over for NP Corean Comment to signed, would like a callback on if it has been received and signed  [phone]

## 2023-09-11 ENCOUNTER — Encounter: Payer: Self-pay | Admitting: *Deleted

## 2023-09-18 NOTE — Pre-Procedure Instructions (Signed)
 Surgical Instructions   Your procedure is scheduled on Thursday, August 14th. Report to [facility] Main Entrance A at 06:45 A.M., then check in with the Admitting office. Any questions or running late day of surgery: call [phone]  Questions prior to your surgery date: call [phone], Monday-Friday, 8am-4pm. If you experience any cold or flu symptoms such as cough, fever, chills, shortness of breath, etc. between now and your scheduled surgery, please notify us  at the above number.     Remember:  Do not eat after midnight the night before your surgery   You may drink clear liquids until 05:45 AM the morning of your surgery.   Clear liquids allowed are: Water, Non-Citrus Juices (without pulp), Carbonated Beverages, Clear Tea (no milk, honey, etc.), Black Coffee Only (NO MILK, CREAM OR POWDERED CREAMER of any kind), and Gatorade.    Take these medicines the morning of surgery with A SIP OF WATER  acetaminophen  (TYLENOL )  busPIRone  (BUSPAR )  hydrALAZINE  (APRESOLINE )  metoprolol  tartrate (LOPRESSOR )  amLODipine  (NORVASC )  sertraline  (ZOLOFT )    May take these medicines IF NEEDED: acetaminophen -codeine  (TYLENOL  #4)  levocetirizine (XYZAL )    Follow your surgeon's instructions on when to stop Aspirin  and cilostazol  (PLETAL ).  If no instructions were given by your surgeon then you will need to call the office to get those instructions.    One week prior to surgery, STOP taking any Aleve, Naproxen, Ibuprofen, Motrin, Advil, Goody's, BC's, all herbal medications, fish oil, and non-prescription vitamins.  WHAT DO I DO ABOUT MY DIABETES MEDICATION?   STOP taking dapagliflozin  propanediol (FARXIGA ) 3 days prior to surgery. Last dose 8/10.  Do not take glipiZIDE  (GLUCOTROL  XL) the morning of surgery.      HOW TO MANAGE YOUR DIABETES BEFORE AND AFTER SURGERY  Why is it important to control my blood sugar before and after surgery? Improving blood sugar levels before and  after surgery helps healing and can limit problems. A way of improving blood sugar control is eating a healthy diet by:  Eating less sugar and carbohydrates  Increasing activity/exercise  Talking with your doctor about reaching your blood sugar goals High blood sugars (greater than 180 mg/dL) can raise your risk of infections and slow your recovery, so you will need to focus on controlling your diabetes during the weeks before surgery. Make sure that the doctor who takes care of your diabetes knows about your planned surgery including the date and location.  How do I manage my blood sugar before surgery? Check your blood sugar at least 4 times a day, starting 2 days before surgery, to make sure that the level is not too high or low.  Check your blood sugar the morning of your surgery when you wake up and every 2 hours until you get to the Short Stay unit.  If your blood sugar is less than 70 mg/dL, you will need to treat for low blood sugar: Do not take insulin . Treat a low blood sugar (less than 70 mg/dL) with  cup of clear juice (cranberry or apple), 4 glucose tablets, OR glucose gel. Recheck blood sugar in 15 minutes after treatment (to make sure it is greater than 70 mg/dL). If your blood sugar is not greater than 70 mg/dL on recheck, call [phone] for further instructions. Report your blood sugar to the short stay nurse when you get to Short Stay.  If you are admitted to the hospital after surgery: Your blood sugar will be checked by the staff and you will  probably be given insulin  after surgery (instead of oral diabetes medicines) to make sure you have good blood sugar levels. The goal for blood sugar control after surgery is 80-180 mg/dL.                     Do NOT Smoke (Tobacco/Vaping) for 24 hours prior to your procedure.  If you use a CPAP at night, you may bring your mask/headgear for your overnight stay.   You will be asked to remove any contacts, glasses, piercing's,  hearing aid's, dentures/partials prior to surgery. Please bring cases for these items if needed.    Patients discharged the day of surgery will not be allowed to drive home, and someone needs to stay with them for 24 hours.  SURGICAL WAITING ROOM VISITATION Patients may have no more than 2 support people in the waiting area - these visitors may rotate.   Pre-op nurse will coordinate an appropriate time for 1 ADULT support person, who may not rotate, to accompany patient in pre-op.  Children under the age of 53 must have an adult with them who is not the patient and must remain in the main waiting area with an adult.  If the patient needs to stay at the hospital during part of their recovery, the visitor guidelines for inpatient rooms apply.  Please refer to the [facility] website for the visitor guidelines for any additional information.   If you received a COVID test during your pre-op visit  it is requested that you wear a mask when out in public, stay away from anyone that may not be feeling well and notify your surgeon if you develop symptoms. If you have been in contact with anyone that has tested positive in the last 10 days please notify you surgeon.      Pre-operative CHG Bathing Instructions   You can play a key role in reducing the risk of infection after surgery. Your skin needs to be as free of germs as possible. You can reduce the number of germs on your skin by washing with CHG (chlorhexidine  gluconate) soap before surgery. CHG is an antiseptic soap that kills germs and continues to kill germs even after washing.   DO NOT use if you have an allergy to chlorhexidine /CHG or antibacterial soaps. If your skin becomes reddened or irritated, stop using the CHG and notify one of our RNs at [phone].              TAKE A SHOWER THE NIGHT BEFORE SURGERY AND THE DAY OF SURGERY    Please keep in mind the following:  DO NOT shave, including legs and underarms, 48 hours prior to  surgery.   You may shave your face before/day of surgery.  Place clean sheets on your bed the night before surgery Use a clean washcloth (not used since being washed) for each shower. DO NOT sleep with pet's night before surgery.  CHG Shower Instructions:  Wash your face and private area with normal soap. If you choose to wash your hair, wash first with your normal shampoo.  After you use shampoo/soap, rinse your hair and body thoroughly to remove shampoo/soap residue.  Turn the water OFF and apply half the bottle of CHG soap to a CLEAN washcloth.  Apply CHG soap ONLY FROM YOUR NECK DOWN TO YOUR TOES (washing for 3-5 minutes)  DO NOT use CHG soap on face, private areas, open wounds, or sores.  Pay special attention to the area where  your surgery is being performed.  If you are having back surgery, having someone wash your back for you may be helpful. Wait 2 minutes after CHG soap is applied, then you may rinse off the CHG soap.  Pat dry with a clean towel  Put on clean pajamas    Additional instructions for the day of surgery: DO NOT APPLY any lotions, deodorants, cologne, or perfumes.   Do not wear jewelry or makeup Do not wear nail polish, gel polish, artificial nails, or any other type of covering on natural nails (fingers and toes) Do not bring valuables to the hospital. [facility] is not responsible for valuables/personal belongings. Put on clean/comfortable clothes.  Please brush your teeth.  Ask your nurse before applying any prescription medications to the skin.

## 2023-09-19 ENCOUNTER — Encounter (HOSPITAL_COMMUNITY): Payer: Self-pay

## 2023-09-19 ENCOUNTER — Encounter (HOSPITAL_COMMUNITY)
Admission: RE | Admit: 2023-09-19 | Discharge: 2023-09-19 | Disposition: A | Discharge status: Home / Self Care | Source: Ambulatory Visit | Attending: General Surgery | Admitting: General Surgery

## 2023-09-19 ENCOUNTER — Other Ambulatory Visit: Payer: Self-pay

## 2023-09-19 VITALS — BP 153/73 | HR 62 | Temp 97.9°F | Resp 17 | Ht 59.0 in | Wt 141.9 lb

## 2023-09-19 DIAGNOSIS — E1122 Type 2 diabetes mellitus with diabetic chronic kidney disease: Secondary | ICD-10-CM | POA: Diagnosis not present

## 2023-09-19 DIAGNOSIS — Z87891 Personal history of nicotine dependence: Secondary | ICD-10-CM | POA: Diagnosis not present

## 2023-09-19 DIAGNOSIS — I129 Hypertensive chronic kidney disease with stage 1 through stage 4 chronic kidney disease, or unspecified chronic kidney disease: Secondary | ICD-10-CM | POA: Diagnosis not present

## 2023-09-19 DIAGNOSIS — C50412 Malignant neoplasm of upper-outer quadrant of left female breast: Secondary | ICD-10-CM | POA: Insufficient documentation

## 2023-09-19 DIAGNOSIS — Z8673 Personal history of transient ischemic attack (TIA), and cerebral infarction without residual deficits: Secondary | ICD-10-CM | POA: Diagnosis not present

## 2023-09-19 DIAGNOSIS — Z171 Estrogen receptor negative status [ER-]: Secondary | ICD-10-CM | POA: Diagnosis not present

## 2023-09-19 DIAGNOSIS — E119 Type 2 diabetes mellitus without complications: Secondary | ICD-10-CM

## 2023-09-19 DIAGNOSIS — Z955 Presence of coronary angioplasty implant and graft: Secondary | ICD-10-CM | POA: Diagnosis not present

## 2023-09-19 DIAGNOSIS — Z01818 Encounter for other preprocedural examination: Secondary | ICD-10-CM | POA: Diagnosis not present

## 2023-09-19 DIAGNOSIS — I252 Old myocardial infarction: Secondary | ICD-10-CM | POA: Diagnosis not present

## 2023-09-19 DIAGNOSIS — I25119 Atherosclerotic heart disease of native coronary artery with unspecified angina pectoris: Secondary | ICD-10-CM | POA: Insufficient documentation

## 2023-09-19 DIAGNOSIS — K219 Gastro-esophageal reflux disease without esophagitis: Secondary | ICD-10-CM | POA: Insufficient documentation

## 2023-09-19 DIAGNOSIS — N189 Chronic kidney disease, unspecified: Secondary | ICD-10-CM | POA: Insufficient documentation

## 2023-09-19 DIAGNOSIS — I2510 Atherosclerotic heart disease of native coronary artery without angina pectoris: Secondary | ICD-10-CM

## 2023-09-19 HISTORY — DX: Cardiac murmur, unspecified: R01.1

## 2023-09-19 LAB — CBC
HCT: 42.2 % (ref 36.0–46.0)
Hemoglobin: 13.1 g/dL (ref 12.0–15.0)
MCH: 24.3 pg — ABNORMAL LOW (ref 26.0–34.0)
MCHC: 31 g/dL (ref 30.0–36.0)
MCV: 78.1 fL — ABNORMAL LOW (ref 80.0–100.0)
Platelets: 304 K/uL (ref 150–400)
RBC: 5.4 MIL/uL — ABNORMAL HIGH (ref 3.87–5.11)
RDW: 15.6 % — ABNORMAL HIGH (ref 11.5–15.5)
WBC: 8.7 K/uL (ref 4.0–10.5)
nRBC: 0 % (ref 0.0–0.2)

## 2023-09-19 LAB — BASIC METABOLIC PANEL WITH GFR
Anion gap: 10 (ref 5–15)
BUN: 18 mg/dL (ref 8–23)
CO2: 23 mmol/L (ref 22–32)
Calcium: 10.3 mg/dL (ref 8.9–10.3)
Chloride: 109 mmol/L (ref 98–111)
Creatinine, Ser: 1.14 mg/dL — ABNORMAL HIGH (ref 0.44–1.00)
GFR, Estimated: 51 mL/min — ABNORMAL LOW (ref >60)
Glucose, Bld: 91 mg/dL (ref 70–99)
Potassium: 4 mmol/L (ref 3.5–5.1)
Sodium: 142 mmol/L (ref 135–145)

## 2023-09-19 LAB — SURGICAL PCR SCREEN
MRSA, PCR: NEGATIVE
Staphylococcus aureus: NEGATIVE

## 2023-09-19 LAB — HEMOGLOBIN A1C
Hgb A1c MFr Bld: 6.5 % — ABNORMAL HIGH (ref 4.8–5.6)
Mean Plasma Glucose: 140 mg/dL

## 2023-09-19 LAB — GLUCOSE, CAPILLARY: Glucose-Capillary: 155 mg/dL — ABNORMAL HIGH (ref 70–99)

## 2023-09-19 NOTE — Progress Notes (Signed)
 PCP - Corean Comment, NP Cardiologist - Dr. Debby Manner - last office visit 09/05/2023  PPM/ICD - Denies Device Orders - n/a Rep Notified - n/a  Chest x-ray - n/a EKG - 09/19/2023 Stress Test - 06/07/2013 ECHO - 01/19/2019 - Per Dr. Rockland note, pt is to have another echocardiogram but she does not require cardiac testing prior to surgery. Echo is scheduled for 09/29/23 Cardiac Cath - 2005 with stent placed   Sleep Study - Denies CPAP - n/a  Pt is DM2. She has a meter at home, but does not check her blood sugars, therefore normal fasting is unknown. CBG at pre-op 155. A1c result pending  Last dose of GLP1 agonist-  n/a GLP1 instructions: n/a  Blood Thinner Instructions: Pt instructed to not take anymore Pletal  prior to surgery per Dr. Dia office on 09/19/2023 Aspirin  Instructions: Pt instructed to not take anymore ASA prior to surgery per Dr. Dia office on 09/19/2023  ERAS Protcol - Clear liquids until 0545 morning of surgery PRE-SURGERY Ensure or G2- n/a  COVID TEST- n/a   Anesthesia review: Yes. Cardiac clearance (hx of CAD with stent in 2005, heart murmur, CVA, MI) DM2, multiple vascular surgeries.    Patient denies shortness of breath, fever, cough and chest pain at PAT appointment. Pt denies any respiratory illness/infection in the last two months.   All instructions explained to the patient, with a verbal understanding of the material. Patient agrees to go over the instructions while at home for a better understanding. Patient also instructed to self quarantine after being tested for COVID-19. The opportunity to ask questions was provided.

## 2023-09-19 NOTE — Progress Notes (Signed)
 Per Dr. Dia office, as of today, pt is NOT to take anymore Pletal  and Aspirin  prior to surgery. Information was given to pt and pts daughter at pre-op appointment 09/19/2023 at 1400 with all questions answered.

## 2023-09-19 NOTE — Pre-Procedure Instructions (Addendum)
 Surgical Instructions   Your procedure is scheduled on Thursday, August 14th. Report to [facility] Main Entrance A at 06:45 A.M., then check in with the Admitting office. Any questions or running late day of surgery: call [phone]  Questions prior to your surgery date: call [phone], Monday-Friday, 8am-4pm. If you experience any cold or flu symptoms such as cough, fever, chills, shortness of breath, etc. between now and your scheduled surgery, please notify us  at the above number.     Remember:  Do not eat after midnight the night before your surgery   You may drink clear liquids until 05:45 AM the morning of your surgery.   Clear liquids allowed are: Water, Non-Citrus Juices (without pulp), Carbonated Beverages, Clear Tea (no milk, honey, etc.), Black Coffee Only (NO MILK, CREAM OR POWDERED CREAMER of any kind), and Gatorade.    Take these medicines the morning of surgery with A SIP OF WATER  acetaminophen  (TYLENOL )  busPIRone  (BUSPAR )  hydrALAZINE  (APRESOLINE )  metoprolol  tartrate (LOPRESSOR )  amLODipine  (NORVASC )  sertraline  (ZOLOFT )    May take these medicines IF NEEDED: acetaminophen -codeine  (TYLENOL  #4)  levocetirizine (XYZAL )    DO NOT TAKE ANYMORE ASPIRIN  PRIOR TO SURGERY.  DO NOT TAKE ANYMORE PLETAL  PRIOR TO SURGERY.   One week prior to surgery, STOP taking any Aleve, Naproxen, Ibuprofen, Motrin, Advil, Goody's, BC's, all herbal medications, fish oil, and non-prescription vitamins.   WHAT DO I DO ABOUT MY DIABETES MEDICATION?   STOP taking dapagliflozin  propanediol (FARXIGA ) 3 days prior to surgery. Last dose 8/10.  Do not take glipiZIDE  (GLUCOTROL  XL) the morning of surgery.     HOW TO MANAGE YOUR DIABETES BEFORE AND AFTER SURGERY  Why is it important to control my blood sugar before and after surgery? Improving blood sugar levels before and after surgery helps healing and can limit problems. A way of improving blood sugar control is eating a  healthy diet by:  Eating less sugar and carbohydrates  Increasing activity/exercise  Talking with your doctor about reaching your blood sugar goals High blood sugars (greater than 180 mg/dL) can raise your risk of infections and slow your recovery, so you will need to focus on controlling your diabetes during the weeks before surgery. Make sure that the doctor who takes care of your diabetes knows about your planned surgery including the date and location.  How do I manage my blood sugar before surgery? Check your blood sugar at least 4 times a day, starting 2 days before surgery, to make sure that the level is not too high or low.  Check your blood sugar the morning of your surgery when you wake up and every 2 hours until you get to the Short Stay unit.  If your blood sugar is less than 70 mg/dL, you will need to treat for low blood sugar: Do not take insulin . Treat a low blood sugar (less than 70 mg/dL) with  cup of clear juice (cranberry or apple), 4 glucose tablets, OR glucose gel. Recheck blood sugar in 15 minutes after treatment (to make sure it is greater than 70 mg/dL). If your blood sugar is not greater than 70 mg/dL on recheck, call [phone] for further instructions. Report your blood sugar to the short stay nurse when you get to Short Stay.  If you are admitted to the hospital after surgery: Your blood sugar will be checked by the staff and you will probably be given insulin  after surgery (instead of oral diabetes medicines) to make sure you have good  blood sugar levels. The goal for blood sugar control after surgery is 80-180 mg/dL.                     Do NOT Smoke (Tobacco/Vaping) for 24 hours prior to your procedure.  If you use a CPAP at night, you may bring your mask/headgear for your overnight stay.   You will be asked to remove any contacts, glasses, piercing's, hearing aid's, dentures/partials prior to surgery. Please bring cases for these items if needed.     Patients discharged the day of surgery will not be allowed to drive home, and someone needs to stay with them for 24 hours.  SURGICAL WAITING ROOM VISITATION Patients may have no more than 2 support people in the waiting area - these visitors may rotate.   Pre-op nurse will coordinate an appropriate time for 1 ADULT support person, who may not rotate, to accompany patient in pre-op.  Children under the age of 64 must have an adult with them who is not the patient and must remain in the main waiting area with an adult.  If the patient needs to stay at the hospital during part of their recovery, the visitor guidelines for inpatient rooms apply.  Please refer to the [facility] website for the visitor guidelines for any additional information.   If you received a COVID test during your pre-op visit  it is requested that you wear a mask when out in public, stay away from anyone that may not be feeling well and notify your surgeon if you develop symptoms. If you have been in contact with anyone that has tested positive in the last 10 days please notify you surgeon.      Pre-operative CHG Bathing Instructions   You can play a key role in reducing the risk of infection after surgery. Your skin needs to be as free of germs as possible. You can reduce the number of germs on your skin by washing with CHG (chlorhexidine  gluconate) soap before surgery. CHG is an antiseptic soap that kills germs and continues to kill germs even after washing.   DO NOT use if you have an allergy to chlorhexidine /CHG or antibacterial soaps. If your skin becomes reddened or irritated, stop using the CHG and notify one of our RNs at [phone].              TAKE A SHOWER THE NIGHT BEFORE SURGERY AND THE DAY OF SURGERY    Please keep in mind the following:  DO NOT shave, including legs and underarms, 48 hours prior to surgery.   You may shave your face before/day of surgery.  Place clean sheets on your bed the night  before surgery Use a clean washcloth (not used since being washed) for each shower. DO NOT sleep with pet's night before surgery.  CHG Shower Instructions:  Wash your face and private area with normal soap. If you choose to wash your hair, wash first with your normal shampoo.  After you use shampoo/soap, rinse your hair and body thoroughly to remove shampoo/soap residue.  Turn the water OFF and apply half the bottle of CHG soap to a CLEAN washcloth.  Apply CHG soap ONLY FROM YOUR NECK DOWN TO YOUR TOES (washing for 3-5 minutes)  DO NOT use CHG soap on face, private areas, open wounds, or sores.  Pay special attention to the area where your surgery is being performed.  If you are having back surgery, having someone wash your back  for you may be helpful. Wait 2 minutes after CHG soap is applied, then you may rinse off the CHG soap.  Pat dry with a clean towel  Put on clean pajamas    Additional instructions for the day of surgery: DO NOT APPLY any lotions, deodorants, cologne, or perfumes.   Do not wear jewelry or makeup Do not wear nail polish, gel polish, artificial nails, or any other type of covering on natural nails (fingers and toes) Do not bring valuables to the hospital. [facility] is not responsible for valuables/personal belongings. Put on clean/comfortable clothes.  Please brush your teeth.  Ask your nurse before applying any prescription medications to the skin.

## 2023-09-22 NOTE — Anesthesia Preprocedure Evaluation (Addendum)
 Anesthesia Evaluation  Patient identified by MRN, date of birth, ID band Patient awake    Reviewed: Allergy & Precautions, H&P , NPO status , Patient's Chart, lab work & pertinent test results, reviewed documented beta blocker date and time   Airway Mallampati: II  TM Distance: >3 FB Neck ROM: Full    Dental no notable dental hx.    Pulmonary neg pulmonary ROS, former smoker   Pulmonary exam normal breath sounds clear to auscultation       Cardiovascular hypertension, Pt. on medications and Pt. on home beta blockers + CAD, + Past MI, + Cardiac Stents and + Peripheral Vascular Disease  Normal cardiovascular exam+ Valvular Problems/Murmurs (mild MR) MR  Rhythm:Regular Rate:Normal  MI 1990's; s/p RCA stent ~ [place]    Echo 01/21/19 ([facility]): Summary  Mild concentric left ventricular hypertrophy  Normal left ventricular size and systolic function with no appreciable  segmental abnormality.  Ejection fraction is visually estimated at 60-65%  Mild mitral regurgitation  No change compared to echo from 12/2017       Nuclear stress test 06/07/13 [facility] CV; scanned under Media tab, Correspondence Enc 02/07/14): Resting EKG NSR, poor r wave progression. Non-diagnostic stress EKG. No ST/T changes of ischemia noted with pharmacologic stress testing. Stress symptoms included being winded. Stress terminated due to completion of protocol. The perfusion study demonstrated normal isotope uptake both at rest and stress. There was no evidence of ischemia or scar.  Dynamic gated images reveal normal wall motion and endocardial thickening. LVEF estimated at 73%.   Cardiac cath 02/01/10 ([facility]; scanned under Media tab, Correspondence Enc 02/07/14):):  Summary: RCA stent patent with mild in-stent restenosis (40% distal RCA, 35% proximal RCA).  Other vessels have non-obstructive disease as before, LAD is slightly worse (35% mid LAD). LCx  normal. LV gram shows mild inferobasal hypokinesis.  Recommendation: Medical therapy.    Neuro/Psych  PSYCHIATRIC DISORDERS Anxiety Depression    CVA (s/p CEA), No Residual Symptoms    GI/Hepatic Neg liver ROS,GERD  Controlled,,  Endo/Other  diabetes, Well Controlled, Type 2, Oral Hypoglycemic Agents  A1c 6.5  Renal/GU Renal InsufficiencyRenal disease (cr 1.14)  negative genitourinary   Musculoskeletal  (+) Arthritis , Osteoarthritis,    Abdominal   Peds negative pediatric ROS (+)  Hematology negative hematology ROS (+) Hb 13.1   Anesthesia Other Findings   Reproductive/Obstetrics negative OB ROS                              Anesthesia Physical Anesthesia Plan  ASA: 3  Anesthesia Plan: General and Regional   Post-op Pain Management: Tylenol  PO (pre-op)* and Regional block*   Induction: Intravenous  PONV Risk Score and Plan: 3 and Ondansetron , Dexamethasone , Midazolam  and Treatment may vary due to age or medical condition  Airway Management Planned: Oral ETT and Video Laryngoscope Planned  Additional Equipment: None  Intra-op Plan:   Post-operative Plan: Extubation in OR  Informed Consent: I have reviewed the patients History and Physical, chart, labs and discussed the procedure including the risks, benefits and alternatives for the proposed anesthesia with the patient or authorized representative who has indicated his/her understanding and acceptance.     Dental advisory given  Plan Discussed with: CRNA  Anesthesia Plan Comments: ( )         Anesthesia Quick Evaluation

## 2023-09-22 NOTE — Progress Notes (Signed)
 Anesthesia Chart Review:  Case: [id number] Date/Time: 09/25/23 0830   Procedures:      BREAST LUMPECTOMY WITH RADIOACTIVE SEED LOCALIZATION (Left) - LEFT BREAST SEED LOCALIZATION LUMPECTOMY     BIOPSY, LYMPH NODE, SENTINEL     INSERTION, TUNNELED CENTRAL VENOUS DEVICE, WITH PORT   Anesthesia type: General   Diagnosis: Malignant neoplasm of upper-outer quadrant of left breast in female, estrogen receptor negative (HCC) [C50.412, Z17.1]   Pre-op diagnosis: LEFT BREAST CANCER   [facility] 09 / [facility]   Surgeons: Aron Shoulders, MD       DISCUSSION: Patient is a 73 year old female scheduled for the above procedure.  History includes former smoker (quit 05/30/09), HTN, CAD/angina (MI 1990's; s/p RCA stent ~ [place]), DM2, PAD (with claudication, unsuccessful attempt right peroneal artery angioplasty 07/20/13, s/p right tibioperoneal trunk atherectomy/angioplasty 10/05/13), CVA/TIA, carotid artery disease (left carotid endarterectomy 01/19/14; right CEA 04/18/14, s/p incision & drainage of a seroma 04/24/14), GERD, keloid (s/p right neck keloid excision 08/22/16, 09/18/20), left breast cancer (08/2023).   Last cardiology visit Maximo Leach, NP was on 09/05/23. CAD stable without chest pain. Mild MR in 2020 TTE. On statin therapy for dyslipidemia per primary care. BP acceptable. She wrote, Overall she seems to be managing well from a cardiac standpoint. She is not describing any anginal symptoms. This seems to be stable in regards to her coronary disease  She is scheduled to undergo a lumpectomy. She does not need any cardiac testing in preparation for her procedure. A routine follow-up echo is planned before next visit to re-evaluate mild MR. Dr. Aron instructed hold ASA and Pletal  for surgery starting 09/19/23.  A1c 6.5%. On Farxig, glipizide . Last Fargixa planned for 09/21/23.   RSL is scheduled for 09/23/23. Anesthesia team to evaluate on the day of surgery.   VS: BP (!) 153/73    Pulse 62   Temp 36.6 C   Resp 17   Ht 4' 11 (1.499 m)   Wt 64.4 kg   SpO2 99%   BMI 28.66 kg/m    PROVIDERS: Lucius Krabbe, NP is PCP  Raylene Ned, MD is Cardiologist ([facility]) [name] Bruckner, MD is vascular surgeon, last visit 03/11/23 with one year follow-up planned. Odean Potts, MD is HEM-ONC Shannon Agent, MD is RAD-ONC   LABS: Labs reviewed: Acceptable for surgery. (all labs ordered are listed, but only abnormal results are displayed)  Labs Reviewed  GLUCOSE, CAPILLARY - Abnormal; Notable for the following components:      Result Value   Glucose-Capillary 155 (*)    All other components within normal limits  HEMOGLOBIN A1C - Abnormal; Notable for the following components:   Hgb A1c MFr Bld 6.5 (*)    All other components within normal limits  BASIC METABOLIC PANEL WITH GFR - Abnormal; Notable for the following components:   Creatinine, Ser 1.14 (*)    GFR, Estimated 51 (*)    All other components within normal limits  CBC - Abnormal; Notable for the following components:   RBC 5.40 (*)    MCV 78.1 (*)    MCH 24.3 (*)    RDW 15.6 (*)    All other components within normal limits  SURGICAL PCR SCREEN  LFTs normal 08/13/23.    EKG: 09/19/23: Sinus bradycardia at 57 bpm Nonspecific T wave abnormality consdier ischemia Since last tracing rate slower Confirmed by Okey Moccasin ([id number]) on 09/19/2023 10:51:42 PM    CV:  Carotid US  12/03/22: Summary:  -  Right Carotid: There is no evidence of stenosis in the right ICA.  - Left Carotid: There is no evidence of stenosis in the left ICA.  - Vertebrals:  Bilateral vertebral arteries demonstrate antegrade flow.  - Subclavians: Normal flow hemodynamics were seen in bilateral subclavian  arteries.     Echo 01/21/19 ([facility]): Summary  Mild concentric left ventricular hypertrophy  Normal left ventricular size and systolic function with no appreciable  segmental abnormality.  Ejection fraction is  visually estimated at 60-65%  Mild mitral regurgitation  No change compared to echo from 12/2017      Nuclear stress test 06/07/13 [facility] CV; scanned under Media tab, Correspondence Enc 02/07/14): Resting EKG NSR, poor r wave progression. Non-diagnostic stress EKG. No ST/T changes of ischemia noted with pharmacologic stress testing. Stress symptoms included being winded. Stress terminated due to completion of protocol. The perfusion study demonstrated normal isotope uptake both at rest and stress. There was no evidence of ischemia or scar.  Dynamic gated images reveal normal wall motion and endocardial thickening. LVEF estimated at 73%.   Cardiac cath 02/01/10 ([facility]; scanned under Media tab, Correspondence Enc 02/07/14):):  Summary: RCA stent patent with mild in-stent restenosis (40% distal RCA, 35% proximal RCA).  Other vessels have non-obstructive disease as before, LAD is slightly worse (35% mid LAD). LCx normal. LV gram shows mild inferobasal hypokinesis.  Recommendation: Medical therapy.  Past Medical History:  Diagnosis Date   Abdominal pain 10/11/2012   Acute kidney injury superimposed on CKD (HCC) 08/01/2018   Anemia 05/31/2011   Iron deficiency   Angina    Anxiety    Arthritis    knees (07/20/2013)   Atypical chest pain 05/31/2011   Cancer (HCC) 2025   Left Breast Cancer   Carotid stenosis, asymptomatic 04/18/2014   Claudication (HCC)    Coronary artery disease 2005   Stent placed to RCA   COVID-19 virus infection 08/01/2018   Depression    Family history of colon cancer    GERD (gastroesophageal reflux disease)    Heart murmur    Hematoma of neck 04/23/2014   High cholesterol    Hyperlipemia 05/31/2011   Hypertension    Hypokalemia 05/31/2011   Leucocytosis 05/31/2011   Myocardial infarction [facility]) 1990's   1   Neck pain on right side 10/04/2014   PAD (peripheral artery disease) (HCC)    Peripheral arterial disease (HCC) 06/08/2013   Peripheral arterial  disease   Radiculopathy of leg 10/11/2012   Stroke (HCC)    mini stroke 1st then regular stroke, denies residual on 07/20/2013   Type 2 diabetes mellitus with diabetic peripheral angiopathy without gangrene, without long-term current use of insulin  (HCC) 08/13/2023   Wears glasses     Past Surgical History:  Procedure Laterality Date   ABDOMINAL HYSTERECTOMY     ATHERECTOMY  10/05/2013   BALLOON ANGIOPLASTY, ARTERY  10/05/2013   DR LADONA   BREAST BIOPSY Left 08/05/2023   US  LT BREAST BX W LOC DEV 1ST LESION IMG BX SPEC US  GUIDE 08/05/2023 [facility]AMMOGRAPHY   CAROTID ENDARTERECTOMY     CORONARY ANGIOPLASTY WITH STENT PLACEMENT     1   ENDARTERECTOMY Left 02/07/2014   Procedure: ENDARTERECTOMY CAROTID;  Surgeon: Carlin FORBES Haddock, MD;  Location: [facility] OR;  Service: Vascular;  Laterality: Left;   ENDARTERECTOMY Right 04/18/2014   Procedure: ENDARTERECTOMY RIGHT CAROTID;  Surgeon: Carlin FORBES Haddock, MD;  Location: [facility] OR;  Service: Vascular;  Laterality: Right;   ENDARTERECTOMY N/A 04/24/2014  Procedure: IRRIGATION AND DEBRIDEMENT OF RIGHT NECK ;  Surgeon: Carlin FORBES Haddock, MD;  Location: [facility] OR;  Service: Vascular;  Laterality: N/A;   ESOPHAGOGASTRODUODENOSCOPY N/A 10/13/2012   Procedure: ESOPHAGOGASTRODUODENOSCOPY (EGD);  Surgeon: Lupita FORBES Commander, MD;  Location: [facility] ENDOSCOPY;  Service: Endoscopy;  Laterality: N/A;   KENALOG  INJECTION Bilateral 08/22/2016   Procedure: KENALOG  INJECTION BILATERAL NECK;  Surgeon: Lowery Estefana RAMAN, DO;  Location: [facility];  Service: Plastics;  Laterality: Bilateral;   LOWER EXTREMITY ANGIOGRAM  07/20/2013   Unsuccessful attempt at crossing the CTO/notes 07/20/2013   LOWER EXTREMITY ANGIOGRAM N/A 07/06/2013   Procedure: LOWER EXTREMITY ANGIOGRAM;  Surgeon: Erick JONELLE Bergamo, MD;  Location: [facility] CATH LAB;  Service: Cardiovascular;  Laterality: N/A;   LOWER EXTREMITY ANGIOGRAM N/A 07/20/2013   Procedure: LOWER EXTREMITY ANGIOGRAM;  Surgeon: Erick JONELLE Bergamo,  MD;  Location: [facility] CATH LAB;  Service: Cardiovascular;  Laterality: N/A;   LOWER EXTREMITY ANGIOGRAM N/A 10/05/2013   Procedure: LOWER EXTREMITY ANGIOGRAM;  Surgeon: Erick JONELLE Bergamo, MD;  Location: [facility] CATH LAB;  Service: Cardiovascular;  Laterality: N/A;   MASS EXCISION Right 08/22/2016   Procedure: EXCISION RIGHT NECK KELOID;  Surgeon: Lowery Estefana RAMAN, DO;  Location: [facility];  Service: Plastics;  Laterality: Right;   MASS EXCISION Right 09/18/2020   Procedure: Excision of right neck keloid;  Surgeon: Lowery Estefana RAMAN, DO;  [facility] OR;  Service: Plastics;  Laterality: Right;   PATCH ANGIOPLASTY Left 02/07/2014   Procedure: PATCH ANGIOPLASTY Carotid;  Surgeon: Carlin FORBES Haddock, MD;  Location: [facility] OR;  Service: Vascular;  Laterality: Left;   PATCH ANGIOPLASTY Right 04/18/2014   Procedure: PATCH ANGIOPLASTY USING HEMASHIELD 0.8cmx 7.6cm PATCH;  Surgeon: Carlin FORBES Haddock, MD;  Location: [facility] OR;  Service: Vascular;  Laterality: Right;   PERIPHERAL VASCULAR CATHETERIZATION N/A 07/05/2014   Procedure: Lower Extremity Angiography;  Surgeon: Gordy Bergamo, MD;  Location: [facility] INVASIVE CV LAB;  Service: Cardiovascular;  Laterality: N/A;   TUBAL LIGATION      MEDICATIONS:  acetaminophen  (TYLENOL ) 650 MG CR tablet   acetaminophen -codeine  (TYLENOL  #4) 300-60 MG tablet   amLODipine  (NORVASC ) 10 MG tablet   aspirin  EC 81 MG tablet   busPIRone  (BUSPAR ) 5 MG tablet   calcium  carbonate (OSCAL) 1500 (600 Ca) MG TABS tablet   Cholecalciferol (VITAMIN D3) 125 MCG (5000 UT) TABS   cilostazol  (PLETAL ) 100 MG tablet   dapagliflozin  propanediol (FARXIGA ) 10 MG TABS tablet   diphenoxylate -atropine  (LOMOTIL ) 2.5-0.025 MG tablet   glipiZIDE  (GLUCOTROL  XL) 5 MG 24 hr tablet   hydrALAZINE  (APRESOLINE ) 50 MG tablet   levocetirizine (XYZAL ) 5 MG tablet   LINZESS  145 MCG CAPS capsule   metoprolol  tartrate (LOPRESSOR ) 100 MG tablet   niacin  (VITAMIN B3) 500 MG ER tablet   pravastatin  (PRAVACHOL ) 40  MG tablet   sertraline  (ZOLOFT ) 100 MG tablet   sertraline  (ZOLOFT ) 50 MG tablet   valsartan  (DIOVAN ) 320 MG tablet   zolpidem  (AMBIEN  CR) 12.5 MG CR tablet   No current facility-administered medications for this encounter.    Isaiah Ruder, PA-C Surgical Short Stay/Anesthesiology [facility] Phone [phone] [facility] Phone [phone] 09/22/2023 5:08 PM

## 2023-09-23 ENCOUNTER — Telehealth: Payer: Self-pay | Admitting: Family

## 2023-09-23 ENCOUNTER — Ambulatory Visit
Admission: RE | Admit: 2023-09-23 | Discharge: 2023-09-23 | Disposition: A | Discharge status: Home / Self Care | Source: Ambulatory Visit | Attending: General Surgery | Admitting: General Surgery

## 2023-09-23 ENCOUNTER — Other Ambulatory Visit: Payer: Self-pay | Admitting: General Surgery

## 2023-09-23 DIAGNOSIS — Z171 Estrogen receptor negative status [ER-]: Secondary | ICD-10-CM

## 2023-09-23 DIAGNOSIS — C50412 Malignant neoplasm of upper-outer quadrant of left female breast: Secondary | ICD-10-CM | POA: Diagnosis not present

## 2023-09-23 HISTORY — PX: BREAST BIOPSY: SHX20

## 2023-09-23 NOTE — Telephone Encounter (Unsigned)
 Copied from CRM [id number]. Topic: Clinical - Prescription Issue >> Sep 23, 2023 12:44 PM Shereese L wrote: Reason for CRM: patient daughter is calling because her mother is taking 4 different type of blood pressure medication and wanted a call back to discuss which ones to take Please call daughter at @ [phone]

## 2023-09-24 DIAGNOSIS — C50912 Malignant neoplasm of unspecified site of left female breast: Secondary | ICD-10-CM | POA: Diagnosis not present

## 2023-09-24 NOTE — H&P (Signed)
 REFERRING PHYSICIAN: Jerilynn  PROVIDER: JINA CLAIR NEPHEW, MD  Care Team: Patient Care Team: Lucius Krabbe as PCP - General NEPHEW JINA CLAIR, MD as Consulting Provider (Surgical Oncology) Odean Mackey POUR, MD as Referring Physician (Hematology and Oncology) Shannon Lynwood BIRCH, MD as Referring Physician (Radiation Oncology) Raylene Ned, MD as Referring Physician (Cardiovascular Disease) [name] [name] [name], MD (Vascular Surgery)   MRN: [id number] DOB: 04-18-1950   Subjective   Chief Complaint: Breast Cancer   History of Present Illness: Joanne Morris is a 73 y.o. female who is seen today as an office consultation at the request of Dr. Gudena for evaluation of Breast Cancer .  Patient presents with new diagnosis of left breast cancer 07/2023. She had a screening detected mass 11/2022 but had other health issues that interrupted getting follow up mammogram. At the point of diagnostic imaging 06/2023 she had a palpable mass. Diagnostic imaging showed a 1.5 cm mass at 2 o'clock. Core needle biopsy was positive for a grade 2 triple negative invasive ductal carcinoma with a Ki 67 of 70%.   She hasn't had cancer before. Her brother had colon cancer. Her daughter works with Dr. Saintclair in [employer]. She is unaware of other family cancer history. She has history of DM and has vascular disease with h/o bilateral CEA, right lower extremity arterial stent, and remote cardiac stent. She also has arthritis. She is on 81 mg ASA and pletal .   Menarche - teen Menopause - with hysterectomy Parity - 3 children, first age 62.   Work n/a  Diagnostic mammogram/us  07/03/23 BCG ACR Breast Density Category b: There are scattered areas of fibroglandular density.  FINDINGS: Spot-compression CC and MLO views of the area of concern were obtained.  Irregular hyperdense mass with microlobulated margins and associated architectural distortion in the UPPER OUTER QUADRANT at middle to posterior depth  measuring on the order of 1.5 cm in size, increased in size since the screening mammogram in October, 2024. There may be faint associated calcifications.  Targeted ultrasound is performed, demonstrating an irregular hypoechoic mass with angular and microlobulated margins at 1 o'clock 8 cm from the nipple measuring approximately 1.9 x 1.2 x 1.4 cm, with scattered faint internal calcifications, demonstrating slight posterior acoustic enhancement and demonstrating internal power Doppler flow, corresponding to the mammographic finding.  Sonographic evaluation of the axilla demonstrates no pathologic lymphadenopathy.  On correlative physical examination, there is a firm palpable approximate 2 cm mass in UPPER INNER QUADRANT which corresponds to the sonographic finding. There is a hyperpigmented skin lesion in the general vicinity of the mass, though not directly overlying the mass.  IMPRESSION: 1. Highly suspicious 1.9 cm mass in the UPPER INNER QUADRANT LEFT breast 1 o'clock 8 cm from nipple. 2. No pathologic LEFT axillary lymphadenopathy.  RECOMMENDATION: Ultrasound-guided core needle biopsy of the LEFT breast mass.  I have discussed the findings and recommendations with the patient. The ultrasound core needle biopsy procedure was discussed with the patient and her questions were answered. She wishes to proceed with the biopsy which has been scheduled by the [facility] staff.  BI-RADS CATEGORY 5: Highly suggestive of malignancy.   Pathology core needle biopsy: 08/05/23 1. Breast, left, needle core biopsy, 1:00 8 cmfn :  INVASIVE DUCTAL CARCINOMA, SEE NOTE  TUBULE FORMATION: SCORE 3  NUCLEAR PLEOMORPHISM: SCORE 2  MITOTIC COUNT: SCORE 1  TOTAL SCORE: 6  OVERALL GRADE: 2  LYMPHOVASCULAR INVASION: NOT IDENTIFIED  CANCER LENGTH: 1.2 CM  CALCIFICATIONS: NOT IDENTIFIED  OTHER FINDINGS: NONE   Receptors: The tumor cells are NEGATIVE for Her2 (0).   Estrogen Receptor: 0%, NEGATIVE  Progesterone Receptor: 0%, NEGATIVE  Proliferation Marker Ki67: 70%   Review of Systems: A complete review of systems was obtained from the patient. I have reviewed this information and discussed as appropriate with the patient. See HPI as well for other ROS. ROS - right knee pain, anxiety, depression, anemia, hotflashes  Medical History: Past Medical History:  Diagnosis Date  Anemia  Anxiety  Arthritis  Diabetes mellitus without complication (CMS/HHS-HCC)  History of cancer  History of stroke  Hyperlipidemia  Hypertension   Patient Active Problem List  Diagnosis  DM2 (diabetes mellitus, type 2) (CMS/HHS-HCC)  H/O carotid endarterectomy  Malignant neoplasm of upper-outer quadrant of left breast in female, estrogen receptor negative (CMS/HHS-HCC)  Mass of upper outer quadrant of left breast  H/O heart artery stent  PAD (peripheral artery disease) ()  Stage 3a chronic kidney disease (CMS/HHS-HCC)  Type 2 diabetes mellitus with diabetic peripheral angiopathy without gangrene, without long-term current use of insulin  (CMS/HHS-HCC)   Past Surgical History:  Procedure Laterality Date  ENDARTERECTOMY CORONARY ARTERY Right 04/18/2014  .Mass Excision Right 09/18/2020  08/22/16  .Breast Biopsy Left 08/05/2023  HYSTERECTOMY    No Known Allergies  Current Outpatient Medications on File Prior to Visit  Medication Sig Dispense Refill  amLODIPine  (NORVASC ) 10 MG tablet Take 10 mg by mouth once daily  busPIRone  (BUSPAR ) 5 MG tablet Take 5 mg by mouth 2 (two) times daily  cilostazoL  (PLETAL ) 100 MG tablet Take 100 mg by mouth 2 (two) times daily  dapagliflozin  propanediol (FARXIGA ) 10 mg tablet Take 10 mg by mouth once daily  diclofenac  (VOLTAREN ) 1 % topical gel Apply 2 g topically 4 (four) times daily  FARXIGA  5 mg tablet Take 5 mg by mouth once daily  glipiZIDE  (GLUCOTROL  XL) 5 MG XL tablet Take 5 mg by mouth daily with breakfast  hydrALAZINE   (APRESOLINE ) 50 MG tablet Take 50 mg by mouth 3 (three) times daily  levocetirizine (XYZAL ) 5 MG tablet Take 5 mg by mouth once daily  LINZESS  145 mcg capsule Take 145 mcg by mouth once daily  metoprolol  TARTrate (LOPRESSOR ) 100 MG tablet Take 100 mg by mouth 2 (two) times daily @ 9AM and 5PM  pravastatin  (PRAVACHOL ) 40 MG tablet Take 40 mg by mouth once daily 5 pm  valsartan  (DIOVAN ) 320 MG tablet Take 320 mg by mouth once daily  aspirin  81 MG EC tablet Take 81 mg by mouth once daily   No current facility-administered medications on file prior to visit.   Family History  Problem Relation Age of Onset  Breast cancer Mother    Social History   Tobacco Use  Smoking Status Former  Types: Cigarettes  Smokeless Tobacco Never   Are you married, widowed, divorced, separated, never married, or living with a partner?: Separated   Objective:   Vitals:   BP: 122/60  Pulse: 68  Resp: 18  Temp: 36.4 C (97.6 F)  Weight: 64.9 kg (143 lb)  Height: 149.9 cm (4' 11)   Body mass index is 28.88 kg/m.  Gen: No acute distress. Well nourished and well groomed.  Neurological: Alert and oriented to person, place, and time. Coordination normal.  Head: Normocephalic and atraumatic.  Eyes: Conjunctivae are normal. Pupils are equal, round, and reactive to light. No scleral icterus.  Neck: Normal range of motion. Neck supple. No tracheal deviation or thyromegaly present.  Cardiovascular: Normal rate,  regular rhythm, normal heart sounds and intact distal pulses. Exam reveals no gallop and no friction rub. No murmur heard. Breast: right breast larger than left. Ptotic bilaterally . Palpable mass UOQ left with overlying skin bruising. No palpable LN either side. No nipple retraction or nipple discharge. Right breast exam benign.  Respiratory: Effort normal. No respiratory distress. No chest wall tenderness. Breath sounds normal. No wheezes, rales or rhonchi.  GI: Soft. Bowel sounds are normal. The  abdomen is soft and nontender. There is no rebound and no guarding.  Musculoskeletal: Normal range of motion. Extremities are nontender.  Lymphadenopathy: No cervical, preauricular, postauricular or axillary adenopathy is present Skin: Skin is warm and dry. No rash noted. No diaphoresis. No erythema. No pallor. No clubbing, cyanosis, or edema.  Psychiatric: Normal mood and affect. Behavior is normal. Judgment and thought content normal.   Labs 08/13/23 CBC ok CMET with gluc 218, Cr 1.17, Ca 10.4  Assessment and Plan:   ICD-10-CM  1. Malignant neoplasm of upper-outer quadrant of left breast in female, estrogen receptor negative (CMS/HHS-HCC) C50.412  Z17.1   2. H/O carotid endarterectomy Z98.890   3. Type 2 diabetes mellitus with diabetic peripheral angiopathy without gangrene, without long-term current use of insulin  (CMS/HHS-HCC) E11.51   4. PAD (peripheral artery disease) () I73.9   5. H/O heart artery stent Z95.5    Patient has a new diagnosis of clinical T1c N0 triple negative left breast cancer. This is amenable to breast conservation. I discussed this with the patient and her daughter. Will plan to do a seed localized lumpectomy with sentinel lymph node biopsy with  port placement.  I discussed the surgery including risks.The surgical procedure was described to the patient. I discussed the incision type and location and that we would need radiology involved pre op to place a seed 1-2 days pre op.   We discussed the risks bleeding, infection, damage to other structures, need for further procedures/surgeries. We discussed the risk of seroma. The patient was advised that we may need to go back to surgery for additional tissue to obtain negative margins. The patient was advised that these are the most common complications, but that others can occur as well. I discussed the risk of alteration in breast contour or size. I discussed risk of chronic pain. There are rare instances of  heart/lung issues post op as well as blood clots.   They were advised against taking aspirin  or other anti-inflammatory agents/blood thinners the week before surgery.   The risks and benefits of the procedure were described to the patient and she wishes to proceed.

## 2023-09-25 ENCOUNTER — Encounter (HOSPITAL_COMMUNITY): Payer: Self-pay | Admitting: General Surgery

## 2023-09-25 ENCOUNTER — Ambulatory Visit (HOSPITAL_COMMUNITY): Payer: Self-pay | Admitting: Vascular Surgery

## 2023-09-25 ENCOUNTER — Ambulatory Visit (HOSPITAL_COMMUNITY)
Admission: RE | Admit: 2023-09-25 | Discharge: 2023-09-25 | Disposition: A | Discharge status: Home / Self Care | Attending: General Surgery | Admitting: General Surgery

## 2023-09-25 ENCOUNTER — Ambulatory Visit (HOSPITAL_COMMUNITY)

## 2023-09-25 ENCOUNTER — Ambulatory Visit
Admission: RE | Admit: 2023-09-25 | Discharge: 2023-09-25 | Disposition: A | Discharge status: Home / Self Care | Source: Ambulatory Visit | Attending: General Surgery | Admitting: General Surgery

## 2023-09-25 ENCOUNTER — Institutional Professional Consult (permissible substitution): Admitting: Plastic Surgery

## 2023-09-25 ENCOUNTER — Ambulatory Visit (HOSPITAL_BASED_OUTPATIENT_CLINIC_OR_DEPARTMENT_OTHER): Admitting: Anesthesiology

## 2023-09-25 ENCOUNTER — Encounter (HOSPITAL_COMMUNITY)
Admission: RE | Disposition: A | Discharge status: Home / Self Care | Payer: Self-pay | Source: Home / Self Care | Attending: General Surgery

## 2023-09-25 ENCOUNTER — Other Ambulatory Visit: Payer: Self-pay

## 2023-09-25 DIAGNOSIS — Z1732 Human epidermal growth factor receptor 2 negative status: Secondary | ICD-10-CM | POA: Insufficient documentation

## 2023-09-25 DIAGNOSIS — Z171 Estrogen receptor negative status [ER-]: Secondary | ICD-10-CM | POA: Diagnosis not present

## 2023-09-25 DIAGNOSIS — M1990 Unspecified osteoarthritis, unspecified site: Secondary | ICD-10-CM | POA: Diagnosis not present

## 2023-09-25 DIAGNOSIS — Z955 Presence of coronary angioplasty implant and graft: Secondary | ICD-10-CM | POA: Diagnosis not present

## 2023-09-25 DIAGNOSIS — Z1722 Progesterone receptor negative status: Secondary | ICD-10-CM | POA: Diagnosis not present

## 2023-09-25 DIAGNOSIS — C50912 Malignant neoplasm of unspecified site of left female breast: Secondary | ICD-10-CM | POA: Diagnosis not present

## 2023-09-25 DIAGNOSIS — K219 Gastro-esophageal reflux disease without esophagitis: Secondary | ICD-10-CM | POA: Diagnosis not present

## 2023-09-25 DIAGNOSIS — E1151 Type 2 diabetes mellitus with diabetic peripheral angiopathy without gangrene: Secondary | ICD-10-CM | POA: Insufficient documentation

## 2023-09-25 DIAGNOSIS — E1122 Type 2 diabetes mellitus with diabetic chronic kidney disease: Secondary | ICD-10-CM | POA: Insufficient documentation

## 2023-09-25 DIAGNOSIS — Z87891 Personal history of nicotine dependence: Secondary | ICD-10-CM | POA: Insufficient documentation

## 2023-09-25 DIAGNOSIS — E785 Hyperlipidemia, unspecified: Secondary | ICD-10-CM | POA: Diagnosis not present

## 2023-09-25 DIAGNOSIS — N189 Chronic kidney disease, unspecified: Secondary | ICD-10-CM | POA: Diagnosis not present

## 2023-09-25 DIAGNOSIS — I2510 Atherosclerotic heart disease of native coronary artery without angina pectoris: Secondary | ICD-10-CM

## 2023-09-25 DIAGNOSIS — I517 Cardiomegaly: Secondary | ICD-10-CM | POA: Diagnosis not present

## 2023-09-25 DIAGNOSIS — I10 Essential (primary) hypertension: Secondary | ICD-10-CM | POA: Insufficient documentation

## 2023-09-25 DIAGNOSIS — G8918 Other acute postprocedural pain: Secondary | ICD-10-CM | POA: Diagnosis not present

## 2023-09-25 DIAGNOSIS — Z7984 Long term (current) use of oral hypoglycemic drugs: Secondary | ICD-10-CM | POA: Insufficient documentation

## 2023-09-25 DIAGNOSIS — I252 Old myocardial infarction: Secondary | ICD-10-CM | POA: Insufficient documentation

## 2023-09-25 DIAGNOSIS — C50412 Malignant neoplasm of upper-outer quadrant of left female breast: Secondary | ICD-10-CM | POA: Diagnosis not present

## 2023-09-25 DIAGNOSIS — E119 Type 2 diabetes mellitus without complications: Secondary | ICD-10-CM

## 2023-09-25 HISTORY — PX: BREAST LUMPECTOMY WITH RADIOACTIVE SEED LOCALIZATION: SHX6424

## 2023-09-25 HISTORY — PX: SENTINEL NODE BIOPSY: SHX6608

## 2023-09-25 HISTORY — PX: PORTACATH PLACEMENT: SHX2246

## 2023-09-25 LAB — GLUCOSE, CAPILLARY
Glucose-Capillary: 100 mg/dL — ABNORMAL HIGH (ref 70–99)
Glucose-Capillary: 128 mg/dL — ABNORMAL HIGH (ref 70–99)
Glucose-Capillary: 157 mg/dL — ABNORMAL HIGH (ref 70–99)

## 2023-09-25 SURGERY — BREAST LUMPECTOMY WITH RADIOACTIVE SEED LOCALIZATION
Anesthesia: Regional | Site: Chest

## 2023-09-25 MED ORDER — LACTATED RINGERS IV SOLN: INTRAVENOUS | Status: DC

## 2023-09-25 MED ORDER — LIDOCAINE HCL 1 % IJ SOLN
INTRAMUSCULAR | Status: DC | PRN
  Administered 2023-09-25: 6 mL via INTRAMUSCULAR

## 2023-09-25 MED ORDER — EPHEDRINE 5 MG/ML INJ
INTRAVENOUS | Status: AC
  Filled 2023-09-25: qty 5

## 2023-09-25 MED ORDER — DEXAMETHASONE SODIUM PHOSPHATE 10 MG/ML IJ SOLN
INTRAMUSCULAR | Status: DC | PRN
  Administered 2023-09-25: 10 mg via INTRAVENOUS

## 2023-09-25 MED ORDER — HEPARIN 6000 UNIT IRRIGATION SOLUTION
Status: AC
  Filled 2023-09-25: qty 500

## 2023-09-25 MED ORDER — CEFAZOLIN SODIUM-DEXTROSE 2-4 GM/100ML-% IV SOLN: 2.0000 g | INTRAVENOUS | Status: AC

## 2023-09-25 MED ORDER — FENTANYL CITRATE (PF) 100 MCG/2ML IJ SOLN
INTRAMUSCULAR | Status: AC
  Administered 2023-09-25: 100 ug via INTRAVENOUS
  Filled 2023-09-25: qty 2

## 2023-09-25 MED ORDER — PROPOFOL 10 MG/ML IV BOLUS
INTRAVENOUS | Status: AC
  Filled 2023-09-25: qty 20

## 2023-09-25 MED ORDER — ORAL CARE MOUTH RINSE: 15.0000 mL | Freq: Once | OROMUCOSAL | Status: AC

## 2023-09-25 MED ORDER — ONDANSETRON HCL 4 MG/2ML IJ SOLN
INTRAMUSCULAR | Status: AC
Start: 2023-09-25 — End: 2023-09-25
  Filled 2023-09-25: qty 2

## 2023-09-25 MED ORDER — FENTANYL CITRATE (PF) 250 MCG/5ML IJ SOLN
INTRAMUSCULAR | Status: DC | PRN
  Administered 2023-09-25: 50 ug via INTRAVENOUS
  Administered 2023-09-25 (×2): 25 ug via INTRAVENOUS
  Administered 2023-09-25 (×2): 50 ug via INTRAVENOUS

## 2023-09-25 MED ORDER — OXYCODONE HCL 5 MG/5ML PO SOLN: 5.0000 mg | Freq: Once | ORAL | Status: DC | PRN

## 2023-09-25 MED ORDER — DEXAMETHASONE SODIUM PHOSPHATE 10 MG/ML IJ SOLN
INTRAMUSCULAR | Status: AC
  Filled 2023-09-25: qty 1

## 2023-09-25 MED ORDER — OXYCODONE HCL 5 MG PO TABS: 5.0000 mg | ORAL_TABLET | Freq: Four times a day (QID) | ORAL | 0 refills | Status: DC | PRN

## 2023-09-25 MED ORDER — INSULIN ASPART 100 UNIT/ML IJ SOLN: 0.0000 [IU] | INTRAMUSCULAR | Status: DC | PRN

## 2023-09-25 MED ORDER — MIDAZOLAM HCL 2 MG/2ML IJ SOLN
INTRAMUSCULAR | Status: AC
  Filled 2023-09-25: qty 2

## 2023-09-25 MED ORDER — HEPARIN SOD (PORK) LOCK FLUSH 100 UNIT/ML IV SOLN
INTRAVENOUS | Status: AC
  Filled 2023-09-25: qty 5

## 2023-09-25 MED ORDER — OXYCODONE HCL 5 MG PO TABS: 5.0000 mg | ORAL_TABLET | Freq: Once | ORAL | Status: DC | PRN

## 2023-09-25 MED ORDER — LIDOCAINE HCL 1 % IJ SOLN
INTRAMUSCULAR | Status: AC
  Filled 2023-09-25: qty 20

## 2023-09-25 MED ORDER — ACETAMINOPHEN 500 MG PO TABS: 1000.0000 mg | ORAL_TABLET | Freq: Once | ORAL | Status: AC

## 2023-09-25 MED ORDER — BUPIVACAINE LIPOSOME 1.3 % IJ SUSP
INTRAMUSCULAR | Status: DC | PRN
  Administered 2023-09-25: 10 mL via PERINEURAL

## 2023-09-25 MED ORDER — 0.9 % SODIUM CHLORIDE (POUR BTL) OPTIME
TOPICAL | Status: DC | PRN
  Administered 2023-09-25: 1000 mL

## 2023-09-25 MED ORDER — AMISULPRIDE (ANTIEMETIC) 5 MG/2ML IV SOLN: 10.0000 mg | Freq: Once | INTRAVENOUS | Status: DC | PRN

## 2023-09-25 MED ORDER — CEFAZOLIN SODIUM-DEXTROSE 3-4 GM/150ML-% IV SOLN
INTRAVENOUS | Status: DC
Start: 2023-09-25 — End: 2023-09-25
  Filled 2023-09-25: qty 150

## 2023-09-25 MED ORDER — HEPARIN SOD (PORK) LOCK FLUSH 100 UNIT/ML IV SOLN
INTRAVENOUS | Status: DC | PRN
Start: 2023-09-25 — End: 2023-09-25
  Administered 2023-09-25: 500 [IU] via INTRAVENOUS

## 2023-09-25 MED ORDER — LIDOCAINE 2% (20 MG/ML) 5 ML SYRINGE
INTRAMUSCULAR | Status: DC | PRN
  Administered 2023-09-25: 40 mg via INTRAVENOUS

## 2023-09-25 MED ORDER — CEFAZOLIN SODIUM-DEXTROSE 2-4 GM/100ML-% IV SOLN
2.0000 g | INTRAVENOUS | Status: AC
  Administered 2023-09-25: 2 g via INTRAVENOUS
  Filled 2023-09-25: qty 100

## 2023-09-25 MED ORDER — FENTANYL CITRATE (PF) 100 MCG/2ML IJ SOLN: 100.0000 ug | Freq: Once | INTRAMUSCULAR | Status: AC

## 2023-09-25 MED ORDER — CHLORHEXIDINE GLUCONATE CLOTH 2 % EX PADS: 6.0000 | MEDICATED_PAD | Freq: Once | CUTANEOUS | Status: DC

## 2023-09-25 MED ORDER — CHLORHEXIDINE GLUCONATE 0.12 % MT SOLN: 15.0000 mL | Freq: Once | OROMUCOSAL | Status: AC

## 2023-09-25 MED ORDER — LIDOCAINE 2% (20 MG/ML) 5 ML SYRINGE
INTRAMUSCULAR | Status: AC
  Filled 2023-09-25: qty 5

## 2023-09-25 MED ORDER — ACETAMINOPHEN 500 MG PO TABS: 1000.0000 mg | ORAL_TABLET | ORAL | Status: DC

## 2023-09-25 MED ORDER — ONDANSETRON HCL 4 MG/2ML IJ SOLN: 4.0000 mg | Freq: Once | INTRAMUSCULAR | Status: DC | PRN

## 2023-09-25 MED ORDER — ONDANSETRON HCL 4 MG/2ML IJ SOLN
INTRAMUSCULAR | Status: DC | PRN
  Administered 2023-09-25: 4 mg via INTRAVENOUS

## 2023-09-25 MED ORDER — HEPARIN 6000 UNIT IRRIGATION SOLUTION
Status: DC | PRN
Start: 2023-09-25 — End: 2023-09-25
  Administered 2023-09-25: 1

## 2023-09-25 MED ORDER — ROPIVACAINE HCL 5 MG/ML IJ SOLN
INTRAMUSCULAR | Status: DC | PRN
  Administered 2023-09-25: 20 mL via PERINEURAL

## 2023-09-25 MED ORDER — HYDROMORPHONE HCL 1 MG/ML IJ SOLN: 0.2500 mg | INTRAMUSCULAR | Status: DC | PRN

## 2023-09-25 MED ORDER — ROCURONIUM BROMIDE 10 MG/ML (PF) SYRINGE
PREFILLED_SYRINGE | INTRAVENOUS | Status: AC
Start: 2023-09-25 — End: 2023-09-25
  Filled 2023-09-25: qty 10

## 2023-09-25 MED ORDER — ACETAMINOPHEN 500 MG PO TABS
ORAL_TABLET | ORAL | Status: AC
  Administered 2023-09-25: 1000 mg via ORAL
  Filled 2023-09-25: qty 2

## 2023-09-25 MED ORDER — MAGTRACE LYMPHATIC TRACER
INTRAMUSCULAR | Status: DC | PRN
Start: 2023-09-25 — End: 2023-09-25
  Administered 2023-09-25: 2 mL via INTRAMUSCULAR

## 2023-09-25 MED ORDER — FENTANYL CITRATE (PF) 250 MCG/5ML IJ SOLN
INTRAMUSCULAR | Status: AC
  Filled 2023-09-25: qty 5

## 2023-09-25 MED ORDER — EPHEDRINE SULFATE-NACL 50-0.9 MG/10ML-% IV SOSY
PREFILLED_SYRINGE | INTRAVENOUS | Status: DC | PRN
  Administered 2023-09-25 (×2): 10 mg via INTRAVENOUS
  Administered 2023-09-25 (×2): 15 mg via INTRAVENOUS

## 2023-09-25 MED ORDER — BUPIVACAINE-EPINEPHRINE (PF) 0.25% -1:200000 IJ SOLN
INTRAMUSCULAR | Status: AC
  Filled 2023-09-25: qty 30

## 2023-09-25 MED ORDER — CHLORHEXIDINE GLUCONATE 0.12 % MT SOLN
OROMUCOSAL | Status: AC
  Administered 2023-09-25: 15 mL via OROMUCOSAL
  Filled 2023-09-25: qty 15

## 2023-09-25 MED ORDER — PROPOFOL 10 MG/ML IV BOLUS
INTRAVENOUS | Status: DC | PRN
  Administered 2023-09-25: 150 mg via INTRAVENOUS

## 2023-09-25 SURGICAL SUPPLY — 67 items
BAG COUNTER SPONGE SURGICOUNT (BAG) ×4 IMPLANT
BAG DECANTER FOR FLEXI CONT (MISCELLANEOUS) ×4 IMPLANT
BINDER BREAST LRG (GAUZE/BANDAGES/DRESSINGS) IMPLANT
BINDER BREAST XLRG (GAUZE/BANDAGES/DRESSINGS) IMPLANT
BLADE SURG 11 STRL SS (BLADE) IMPLANT
BNDG COHESIVE 4X5 TAN STRL LF (GAUZE/BANDAGES/DRESSINGS) ×4 IMPLANT
CANISTER SUCTION 3000ML PPV (SUCTIONS) ×4 IMPLANT
CHLORAPREP W/TINT 26 (MISCELLANEOUS) ×4 IMPLANT
CLIP TI LARGE 6 (CLIP) ×4 IMPLANT
CLIP TI MEDIUM 24 (CLIP) ×4 IMPLANT
CNTNR URN SCR LID CUP LEK RST (MISCELLANEOUS) IMPLANT
COVER PROBE W GEL 5X96 (DRAPES) ×8 IMPLANT
COVER SURGICAL LIGHT HANDLE (MISCELLANEOUS) ×4 IMPLANT
COVER TRANSDUCER ULTRASND GEL (DISPOSABLE) IMPLANT
DERMABOND ADVANCED .7 DNX12 (GAUZE/BANDAGES/DRESSINGS) ×4 IMPLANT
DEVICE DUBIN SPECIMEN MAMMOGRA (MISCELLANEOUS) IMPLANT
DRAPE C-ARM 42X120 X-RAY (DRAPES) ×4 IMPLANT
DRAPE CHEST BREAST 15X10 FENES (DRAPES) ×4 IMPLANT
DRAPE SURG 17X23 STRL (DRAPES) IMPLANT
DRAPE WARM FLUID 44X44 (DRAPES) IMPLANT
ELECT COATED BLADE 2.86 ST (ELECTRODE) ×4 IMPLANT
ELECTRODE REM PT RTRN 9FT ADLT (ELECTROSURGICAL) ×4 IMPLANT
GAUZE 4X4 16PLY ~~LOC~~+RFID DBL (SPONGE) ×4 IMPLANT
GAUZE PAD ABD 8X10 STRL (GAUZE/BANDAGES/DRESSINGS) IMPLANT
GEL ULTRASOUND 20GR AQUASONIC (MISCELLANEOUS) IMPLANT
GLOVE BIO SURGEON STRL SZ 6 (GLOVE) ×4 IMPLANT
GLOVE INDICATOR 6.5 STRL GRN (GLOVE) ×4 IMPLANT
GOWN STRL REUS W/ TWL LRG LVL3 (GOWN DISPOSABLE) ×4 IMPLANT
GOWN STRL REUS W/ TWL XL LVL3 (GOWN DISPOSABLE) ×4 IMPLANT
KIT BASIN OR (CUSTOM PROCEDURE TRAY) ×4 IMPLANT
KIT MARKER MARGIN INK (KITS) ×4 IMPLANT
KIT PORT POWER 8FR ISP CVUE (Port) IMPLANT
KIT TURNOVER KIT B (KITS) ×4 IMPLANT
LIGHT WAVEGUIDE WIDE FLAT (MISCELLANEOUS) IMPLANT
NDL 18GX1X1/2 (RX/OR ONLY) (NEEDLE) IMPLANT
NDL 22X1.5 STRL (OR ONLY) (MISCELLANEOUS) ×4 IMPLANT
NDL FILTER BLUNT 18X1 1/2 (NEEDLE) IMPLANT
NDL HYPO 25GX1X1/2 BEV (NEEDLE) ×4 IMPLANT
NEEDLE 18GX1X1/2 (RX/OR ONLY) (NEEDLE) IMPLANT
NEEDLE 22X1.5 STRL (OR ONLY) (MISCELLANEOUS) ×3 IMPLANT
NEEDLE FILTER BLUNT 18X1 1/2 (NEEDLE) IMPLANT
NEEDLE HYPO 25GX1X1/2 BEV (NEEDLE) ×6 IMPLANT
NS IRRIG 1000ML POUR BTL (IV SOLUTION) ×4 IMPLANT
PACK GENERAL/GYN (CUSTOM PROCEDURE TRAY) ×4 IMPLANT
PACK UNIVERSAL I (CUSTOM PROCEDURE TRAY) ×4 IMPLANT
PAD ARMBOARD POSITIONER FOAM (MISCELLANEOUS) ×4 IMPLANT
PENCIL BUTTON HOLSTER BLD 10FT (ELECTRODE) ×4 IMPLANT
POSITIONER HEAD DONUT 9IN (MISCELLANEOUS) ×4 IMPLANT
SPIKE FLUID TRANSFER (MISCELLANEOUS) ×8 IMPLANT
STOCKINETTE IMPERVIOUS 9X36 MD (GAUZE/BANDAGES/DRESSINGS) ×4 IMPLANT
STRIP CLOSURE SKIN 1/2X4 (GAUZE/BANDAGES/DRESSINGS) ×4 IMPLANT
SUT MNCRL AB 4-0 PS2 18 (SUTURE) ×8 IMPLANT
SUT MON AB 4-0 PC3 18 (SUTURE) ×4 IMPLANT
SUT PROLENE 2 0 SH DA (SUTURE) ×8 IMPLANT
SUT SILK 2 0 SH (SUTURE) IMPLANT
SUT VIC AB 2-0 SH 27XBRD (SUTURE) IMPLANT
SUT VIC AB 3-0 SH 27X BRD (SUTURE) ×4 IMPLANT
SUT VIC AB 3-0 SH 8-18 (SUTURE) ×4 IMPLANT
SYR 3ML LL SCALE MARK (SYRINGE) IMPLANT
SYR 5ML LUER SLIP (SYRINGE) ×4 IMPLANT
SYR CONTROL 10ML LL (SYRINGE) ×4 IMPLANT
TOWEL GREEN STERILE (TOWEL DISPOSABLE) ×4 IMPLANT
TOWEL GREEN STERILE FF (TOWEL DISPOSABLE) ×4 IMPLANT
TRACER MAGTRACE VIAL (MISCELLANEOUS) IMPLANT
TRAY LAPAROSCOPIC MC (CUSTOM PROCEDURE TRAY) ×4 IMPLANT
TUBE CONNECTING 12X1/4 (SUCTIONS) IMPLANT
YANKAUER SUCT BULB TIP NO VENT (SUCTIONS) IMPLANT

## 2023-09-25 NOTE — Telephone Encounter (Signed)
 Reached out and spoke to pt daughter Chander. Per review of the EMR, there is a [place] mammogram report scanned to chart in June of this year, previously ordered under my name and completed on 07/2022. The mammo result indicated need for follow up diagnostic mammo and they would reach out to patient to schedule. I have no record of ordering the mammogram and if ordered by [facility] radiology under my name without me signing any orders, and then not getting me the results is very upsetting. Have notified my office manager to investigate and informed Chander we will get back to her as soon as possible. Chander also reports a discrepancy on patient's Hydralazine  bottle, stating it says to take as needed. I advised to not take at all and to throw the bottle away as it is old. Continue taking the Amlodipine , Metoprolol , and Valsartan  medications as directed.

## 2023-09-25 NOTE — Interval H&P Note (Signed)
 History and Physical Interval Note:  09/25/2023 9:00 AM  Joanne Morris  has presented today for surgery, with the diagnosis of LEFT BREAST CANCER.  The various methods of treatment have been discussed with the patient and family. After consideration of risks, benefits and other options for treatment, the patient has consented to  Procedure(s) with comments: BREAST LUMPECTOMY WITH RADIOACTIVE SEED LOCALIZATION (Left) - LEFT BREAST SEED LOCALIZATION LUMPECTOMY BIOPSY, LYMPH NODE, SENTINEL (N/A) INSERTION, TUNNELED CENTRAL VENOUS DEVICE, WITH PORT (N/A) as a surgical intervention.  The patient's history has been reviewed, patient examined, no change in status, stable for surgery.  I have reviewed the patient's chart and labs.  Questions were answered to the patient's satisfaction.     Jina Nephew

## 2023-09-25 NOTE — Anesthesia Postprocedure Evaluation (Signed)
 Anesthesia Post Note  Patient: Joanne Morris  Procedure(s) Performed: BREAST LUMPECTOMY WITH RADIOACTIVE SEED LOCALIZATION (Left: Breast) BIOPSY, LYMPH NODE, SENTINEL (Axilla) INSERTION, TUNNELED CENTRAL VENOUS DEVICE, WITH PORT (Chest)     Patient location during evaluation: PACU Anesthesia Type: Regional and General Level of consciousness: awake and alert, oriented and patient cooperative Pain management: pain level controlled Vital Signs Assessment: post-procedure vital signs reviewed and stable Respiratory status: spontaneous breathing, nonlabored ventilation and respiratory function stable Cardiovascular status: blood pressure returned to baseline and stable Postop Assessment: no apparent nausea or vomiting Anesthetic complications: no   No notable events documented.  Last Vitals:  Vitals:   09/25/23 1301 09/25/23 1303  BP:    Pulse: (!) 57 (!) 58  Resp: 18 16  Temp:    SpO2: 91% 98%    Last Pain:  Vitals:   09/25/23 1300  TempSrc:   PainSc: 0-No pain                 Almarie CHRISTELLA Marchi

## 2023-09-25 NOTE — Discharge Instructions (Signed)
 [facility] Office Phone Number [phone]  BREAST BIOPSY/ PARTIAL MASTECTOMY: POST OP INSTRUCTIONS  Always review your discharge instruction sheet given to you by the facility where your surgery was performed.  IF YOU HAVE DISABILITY OR FAMILY LEAVE FORMS, YOU MUST BRING THEM TO THE OFFICE FOR PROCESSING.  DO NOT GIVE THEM TO YOUR DOCTOR.  Take 2 tylenol  (acetominophen) three times a day for 3 days.  If you still have pain, add ibuprofen with food in between if able to take this (if you have kidney issues or stomach issues, do not take ibuprofen).  If both of those are not enough, add the narcotic pain pill.  If you find you are needing a lot of this overnight after surgery, call the next morning for a refill.    Prescriptions will not be filled after 5pm or on week-ends. Take your usually prescribed medications unless otherwise directed You should eat very light the first 24 hours after surgery, such as soup, crackers, pudding, etc.  Resume your normal diet the day after surgery. Most patients will experience some swelling and bruising in the breast.  Ice packs and a good support bra will help.  Swelling and bruising can take several days to resolve.  It is common to experience some constipation if taking pain medication after surgery.  Increasing fluid intake and taking a stool softener will usually help or prevent this problem from occurring.  A mild laxative (Milk of Magnesia or Miralax ) should be taken according to package directions if there are no bowel movements after 48 hours. Unless discharge instructions indicate otherwise, you may remove your bandages 48 hours after surgery, and you may shower at that time.  You may have steri-strips (small skin tapes) in place directly over the incision.  These strips should be left on the skin at least for for 7-10 days.    ACTIVITIES:  You may resume regular daily activities (gradually increasing) beginning the next day.  Wearing a  good support bra or sports bra (or the breast binder) minimizes pain and swelling.  You may have sexual intercourse when it is comfortable. No heavy lifting for 1-2 weeks (not over around 10 pounds).  You may drive when you no longer are taking prescription pain medication, you can comfortably wear a seatbelt, and you can safely maneuver your car and apply brakes. RETURN TO WORK:  __________3-14 days depending on job. _______________ Joanne Morris should see your doctor in the office for a follow-up appointment approximately two weeks after your surgery.  Your doctor's nurse will typically make your follow-up appointment when she calls you with your pathology report.  Expect your pathology report 3-4 business days after your surgery.  You may call to check if you do not hear from us  after three days.   WHEN TO CALL YOUR DOCTOR: Fever over 101.0 Nausea and/or vomiting. Extreme swelling or bruising. Continued bleeding from incision. Increased pain, redness, or drainage from the incision.  The clinic staff is available to answer your questions during regular business hours.  Please don't hesitate to call and ask to speak to one of the nurses for clinical concerns.  If you have a medical emergency, go to the nearest emergency room or call 911.  A surgeon from [facility] is always on call at the hospital.  For further questions, please visit [email or web address]

## 2023-09-25 NOTE — Anesthesia Procedure Notes (Signed)
 Anesthesia Regional Block: Pectoralis block   Pre-Anesthetic Checklist: , timeout performed,  Correct Patient, Correct Site, Correct Laterality,  Correct Procedure, Correct Position, site marked,  Risks and benefits discussed,  Surgical consent,  Pre-op evaluation,  At surgeon's request and post-op pain management  Laterality: Left  Prep: Maximum Sterile Barrier Precautions used, chloraprep       Needles:  Injection technique: Single-shot  Needle Type: Echogenic Stimulator Needle     Needle Length: 9cm  Needle Gauge: 22     Additional Needles:   Procedures:,,,, ultrasound used (permanent image in chart),,    Narrative:  Start time: 09/25/2023 8:25 AM End time: 09/25/2023 8:30 AM Injection made incrementally with aspirations every 5 mL.  Performed by: Personally  Anesthesiologist: Merla Almarie HERO, DO  Additional Notes: Monitors applied. No increased pain on injection. No increased resistance to injection. Injection made in 5cc increments. Good needle visualization. Patient tolerated procedure well.

## 2023-09-25 NOTE — Op Note (Signed)
 Left subclavian port placement, left Breast Radioactive seed localized lumpectomy and sentinel lymph node biopsy  Indications: This patient presents with history of left breast cancer, cT1cN0 grade 3 invasive triple negative invasive ductal carcinoma, upper outer quadrant  Pre-operative Diagnosis: left breast cancer  Post-operative Diagnosis: Same  Surgeon: ARON SHOULDERS   Assistant: Waddell Collier, RNFA  Anesthesia: General endotracheal anesthesia  ASA Class: 3  Procedure Details  The patient was seen in the Holding Room. The risks, benefits, complications, treatment options, and expected outcomes were discussed with the patient. The possibilities of bleeding, infection, the need for additional procedures, failure to diagnose a condition, and creating a complication requiring transfusion or operation were discussed with the patient. The patient concurred with the proposed plan, giving informed consent.  The site of surgery properly noted/marked. The patient was taken to Operating Room # 9, identified, and the procedure verified as left Breast Seed localized Lumpectomy with sentinel lymph node biopsy, port placement. The left arm, breast, and bilateral neck andchest were prepped and draped in standard fashion. A Time Out was held and the above information confirmed. The MagTrace was injected into the left subareolar position.  The port was addressed first. Local anesthetic was administered just under the angle of the left clavicle.  The vein was accessed with 2 pass(es) of the needle. There was good venous return and the wire passed easily with brief ectopy that passed with slight withdrawal of the wire.   Fluoroscopy was used to confirm that the wire was in the vena cava.      The patient was placed back level and the area for the pocket was anethetized   with local anesthetic.  A 3-cm transverse incision was made with a #15   blade.  Cautery was used to divide the subcutaneous tissues down to  the   pectoralis muscle.  An [employer] retractor was used to elevate the skin   while a pocket was created on top of the pectoralis fascia.  The port   was placed into the pocket to confirm that it was of adequate size.  The   catheter was preattached to the port.  The port was then secured to the   pectoralis fascia with four 2-0 Prolene sutures.  These were clamped and   not tied down yet.    The catheter was tunneled through to the wire exit site.  The catheter was placed along the wire to determine what length it should be to be in the SVC.  The catheter was cut at 23 cm.  The tunneler sheath and dilator were passed over the wire and the dilator and wire were removed.  The catheter was advanced through the tunneler sheath and the tunneler sheath was pulled away.  Care was taken to keep the catheter in the tunneler sheath as this occurred. This was advanced and the tunneler sheath was removed.  There was good venous return and easy flush of the catheter.  The Prolene sutures were tied   down to the pectoral fascia.  The skin was reapproximated using 3-0   Vicryl interrupted deep dermal sutures.    Fluoroscopy was used to re-confirm good position of the catheter.  The skin   was then closed using 4-0 Monocryl in a subcuticular fashion.  The port was flushed with concentrated heparin  flush as well.   The lumpectomy was performed by creating an axillary incision near the previously placed radioactive seed.  Dissection was carried down to around the point of  maximum signal intensity. The cautery was used to perform the dissection.  The specimen was inked with the margin marker paint kit.     The background signal in the breast was zero.   Specimen radiography confirmed inclusion of the mammographic lesion, the clip, and the seed. Additional margins were taken at all the cardinal directions.  Hemostasis was achieved with cautery. The edges of the cavity were marked with large clips.   The wound was  irrigated and reinspected for hemostasis.     The same incision was able to be used for the sentinel node biopsy. Dissection was carried through the clavipectoral fascia.  Two level two deep axillary sentinel nodes were removed.  Counts per second were 25, 90, and 10.    The background count was negative cps.  The wound was irrigated.  Hemostasis was achieved with cautery.  The axillary incision was closed with a 3-0 vicryl deep dermal interrupted sutures and a 4-0 monocryl subcuticular closure.    Sterile dressings were applied. At the end of the operation, all sponge, instrument, and needle counts were correct.  Findings: grossly clear surgical margins and no palpable adenopathy. Anterior margin is skin, posterior margin is pectoralis.   Estimated Blood Loss:  min         Specimens: left breast tissue with seed, additional inferior, medial, superior, lateral, anterior, and posterior margins,  and three left axillary sentinel lymph nodes.             Complications:  None; patient tolerated the procedure well.         Disposition: PACU - hemodynamically stable.         Condition: stable

## 2023-09-25 NOTE — Transfer of Care (Signed)
 Immediate Anesthesia Transfer of Care Note  Patient: Joanne Morris  Procedure(s) Performed: BREAST LUMPECTOMY WITH RADIOACTIVE SEED LOCALIZATION (Left: Breast) BIOPSY, LYMPH NODE, SENTINEL (Axilla) INSERTION, TUNNELED CENTRAL VENOUS DEVICE, WITH PORT (Chest)  Patient Location: PACU  Anesthesia Type:General  Level of Consciousness: awake, alert , oriented, and patient cooperative  Airway & Oxygen Therapy: Patient Spontanous Breathing and Patient connected to face mask oxygen  Post-op Assessment: Report given to RN, Post -op Vital signs reviewed and stable, and Patient moving all extremities X 4  Post vital signs: Reviewed and stable  Last Vitals:  Vitals Value Taken Time  BP 180/75 09/25/23 11:55  Temp    Pulse 72 09/25/23 11:56  Resp 22 09/25/23 11:56  SpO2 98 % 09/25/23 11:56  Vitals shown include unfiled device data.  Last Pain:  Vitals:   09/25/23 0730  TempSrc:   PainSc: 0-No pain         Complications: No notable events documented.

## 2023-09-25 NOTE — Anesthesia Procedure Notes (Signed)
 Procedure Name: LMA Insertion Date/Time: 09/25/2023 9:13 AM  Performed by: Vergie Ike LABOR, CRNAPre-anesthesia Checklist: Patient identified, Emergency Drugs available, Suction available and Patient being monitored Patient Re-evaluated:Patient Re-evaluated prior to induction Oxygen Delivery Method: Circle System Utilized Preoxygenation: Pre-oxygenation with 100% oxygen Induction Type: IV induction Ventilation: Mask ventilation without difficulty LMA: LMA inserted LMA Size: 4.0 Number of attempts: 1 Airway Equipment and Method: Bite block Placement Confirmation: positive ETCO2 Tube secured with: Tape Dental Injury: Teeth and Oropharynx as per pre-operative assessment

## 2023-09-26 ENCOUNTER — Encounter (HOSPITAL_COMMUNITY): Payer: Self-pay | Admitting: General Surgery

## 2023-09-27 ENCOUNTER — Other Ambulatory Visit: Payer: Self-pay | Admitting: Family

## 2023-09-27 DIAGNOSIS — F411 Generalized anxiety disorder: Secondary | ICD-10-CM

## 2023-09-29 ENCOUNTER — Telehealth: Payer: Self-pay | Admitting: *Deleted

## 2023-09-29 ENCOUNTER — Ambulatory Visit: Admitting: Family

## 2023-09-29 NOTE — Telephone Encounter (Signed)
 Got a call from patient requesting upcoming appointment be phone visit if possible, spoke with Dr, Odean who confirmed it could be, patient is aware of change

## 2023-09-29 NOTE — Telephone Encounter (Signed)
 Last OV 05/28/23 Next OV 08/17/24  Last refill 03/26/23 Qty #60/5

## 2023-09-30 ENCOUNTER — Ambulatory Visit: Admitting: Family

## 2023-10-01 ENCOUNTER — Ambulatory Visit: Payer: Self-pay | Admitting: General Surgery

## 2023-10-01 ENCOUNTER — Encounter: Payer: Self-pay | Admitting: *Deleted

## 2023-10-01 LAB — SURGICAL PATHOLOGY

## 2023-10-05 ENCOUNTER — Other Ambulatory Visit: Payer: Self-pay | Admitting: Family

## 2023-10-05 DIAGNOSIS — N1831 Chronic kidney disease, stage 3a: Secondary | ICD-10-CM

## 2023-10-05 DIAGNOSIS — N183 Chronic kidney disease, stage 3 unspecified: Secondary | ICD-10-CM

## 2023-10-05 DIAGNOSIS — I739 Peripheral vascular disease, unspecified: Secondary | ICD-10-CM

## 2023-10-05 DIAGNOSIS — I152 Hypertension secondary to endocrine disorders: Secondary | ICD-10-CM

## 2023-10-09 ENCOUNTER — Inpatient Hospital Stay: Attending: Hematology and Oncology | Admitting: Hematology and Oncology

## 2023-10-09 ENCOUNTER — Encounter: Payer: Self-pay | Admitting: *Deleted

## 2023-10-09 DIAGNOSIS — Z171 Estrogen receptor negative status [ER-]: Secondary | ICD-10-CM

## 2023-10-09 DIAGNOSIS — C50412 Malignant neoplasm of upper-outer quadrant of left female breast: Secondary | ICD-10-CM | POA: Diagnosis not present

## 2023-10-09 MED ORDER — PROCHLORPERAZINE MALEATE 10 MG PO TABS: 10.0000 mg | ORAL_TABLET | Freq: Four times a day (QID) | ORAL | 1 refills | Status: AC | PRN

## 2023-10-09 MED ORDER — ONDANSETRON HCL 8 MG PO TABS: 8.0000 mg | ORAL_TABLET | Freq: Three times a day (TID) | ORAL | 1 refills | Status: AC | PRN

## 2023-10-09 MED ORDER — LIDOCAINE-PRILOCAINE 2.5-2.5 % EX CREA: TOPICAL_CREAM | CUTANEOUS | 3 refills | Status: AC

## 2023-10-09 NOTE — Progress Notes (Signed)
 HEMATOLOGY-ONCOLOGY TELEPHONE VISIT PROGRESS NOTE  I connected with our patient on 10/09/23 at 11:30 AM EDT by telephone and verified that I am speaking with the correct person using two identifiers.  I discussed the limitations, risks, security and privacy concerns of performing an evaluation and management service by telephone and the availability of in person appointments.  I also discussed with the patient that there may be a patient responsible charge related to this service. The patient expressed understanding and agreed to proceed.   History of Present Illness: Follow-up after surgery  History of Present Illness Joanne Morris is a 73 year old female with breast cancer who presents for post-surgical follow-up. She is accompanied by her daughter, Joanne Morris.  She underwent surgery on September 25, 2023, for breast cancer removal. The tumor was initially estimated at 1.9 cm by mammogram but was found to be 2.9 cm post-surgery. Surgical margins were clear. Two lymph nodes were removed and tested negative for malignancy.  Her medical history includes heart issues, weakness, and diabetes, which are important for her ongoing treatment and recovery considerations.    Oncology History  Malignant neoplasm of upper-outer quadrant of left breast in female, estrogen receptor negative (HCC)  08/11/2023 Initial Diagnosis   Screening mammogram detected left breast mass upper outer quadrant 1.9 cm by ultrasound axilla negative biopsy: Grade 2 IDC triple negative Ki-67 70%   08/13/2023 Cancer Staging   Staging form: Breast, AJCC 8th Edition - Clinical stage from 08/13/2023: Stage IB (cT1c, cN0, cM0, G2, ER-, PR-, HER2-) - Signed by Odean Potts, MD on 08/13/2023 Stage prefix: Initial diagnosis Histologic grading system: 3 grade system Laterality: Left Staged by: Pathologist and managing physician Stage used in treatment planning: Yes National guidelines used in treatment planning: Yes Type of national  guideline used in treatment planning: NCCN    Genetic Testing   [facility] CancerNext-Expanded Panel+RNA was Negative. Report date is 08/23/2023.   The CancerNext-Expanded gene panel offered by [facility] and includes sequencing, rearrangement, and RNA analysis for the following 77 genes: AIP, ALK, APC, ATM, AXIN2, BAP1, BARD1, BMPR1A, BRCA1, BRCA2, BRIP1, CDC73, CDH1, CDK4, CDKN1B, CDKN2A, CEBPA, CHEK2, CTNNA1, DDX41, DICER1, ETV6, FH, FLCN, GATA2, LZTR1, MAX, MBD4, MEN1, MET, MLH1, MSH2, MSH3, MSH6, MUTYH, NF1, NF2, NTHL1, PALB2, PHOX2B, PMS2, POT1, PRKAR1A, PTCH1, PTEN, RAD51C, RAD51D, RB1, RET, RPS20, RUNX1, SDHA, SDHAF2, SDHB, SDHC, SDHD, SMAD4, SMARCA4, SMARCB1, SMARCE1, STK11, SUFU, TMEM127, TP53, TSC1, TSC2, VHL, and WT1 (sequencing and deletion/duplication); EGFR, HOXB13, KIT, MITF, PDGFRA, POLD1, and POLE (sequencing only); EPCAM and GREM1 (deletion/duplication only).       REVIEW OF SYSTEMS:   Constitutional: Denies fevers, chills or abnormal weight loss All other systems were reviewed with the patient and are negative. Observations/Objective:     Assessment Plan:  Malignant neoplasm of upper-outer quadrant of left breast in female, estrogen receptor negative (HCC) 08/11/2023:Screening mammogram detected left breast mass upper outer quadrant 1.9 cm by ultrasound axilla negative biopsy: Grade 2 IDC triple negative Ki-67 70%   Treatment plan: 09/25/2023: Left lumpectomy: Grade 3 IDC 2.9 cm, margins negative, LVI not identified, ER 0%, PR 0%, HER2 negative, Ki67 70%, 0/2 lymph nodes negative Adjuvant chemotherapy with CMF x 6 cycles (patient has cardiac disease, low performance status, diabetes) Adjuvant radiation therapy ---------------------------------------------------------------------------------------------------------------------------------------- Pathology counseling: I discussed the final pathology report of the patient provided  a copy of this report. I discussed the margins  as well as lymph node surgeries. We also discussed the final staging along with previously performed ER/PR and  HER-2/neu testing.  Recommendation: Adjuvant CMF chemotherapy to begin in 2 weeks      I discussed the assessment and treatment plan with the patient. The patient was provided an opportunity to ask questions and all were answered. The patient agreed with the plan and demonstrated an understanding of the instructions. The patient was advised to call back or seek an in-person evaluation if the symptoms worsen or if the condition fails to improve as anticipated.   I provided 20 minutes of non-face-to-face time during this encounter.  This includes time for charting and coordination of care   Naomi MARLA Chad, MD

## 2023-10-09 NOTE — Progress Notes (Signed)
 START OFF PATHWAY REGIMEN - Breast   [id number]:CMF (Cyclophosphamide IV + Methotrexate IV + Fluorouracil IV) q21 Days:   A cycle is every 21 days:     Cyclophosphamide      Fluorouracil      Methotrexate   **Always confirm dose/schedule in your pharmacy ordering system**  Patient Characteristics: Postoperative without Neoadjuvant Therapy, M0 (Pathologic Staging), Invasive Disease, Adjuvant Therapy, HER2 Negative, ER Negative, Node Negative, pT1a-c, N75mi or pT1c or Higher, pN0 Therapeutic Status: Postoperative without Neoadjuvant Therapy, M0 (Pathologic Staging) AJCC Grade: G3 AJCC N Category: pN0 AJCC M Category: cM0 ER Status: Negative (-) AJCC 8 Stage Grouping: IIA HER2 Status: Negative (-) Oncotype Dx Recurrence Score: Not Appropriate AJCC T Category: pT2 PR Status: Negative (-) Intent of Therapy: Curative Intent, Discussed with Patient

## 2023-10-09 NOTE — Assessment & Plan Note (Signed)
 08/11/2023:Screening mammogram detected left breast mass upper outer quadrant 1.9 cm by ultrasound axilla negative biopsy: Grade 2 IDC triple negative Ki-67 70%   Treatment plan: 09/25/2023: Left lumpectomy: Grade 3 IDC 2.9 cm, margins negative, LVI not identified, ER 0%, PR 0%, HER2 negative, Ki67 70%, 0/2 lymph nodes negative Adjuvant chemotherapy with CMF x 6 cycles (patient has cardiac disease, low performance status, diabetes) Adjuvant radiation therapy ---------------------------------------------------------------------------------------------------------------------------------------- Pathology counseling: I discussed the final pathology report of the patient provided  a copy of this report. I discussed the margins as well as lymph node surgeries. We also discussed the final staging along with previously performed ER/PR and HER-2/neu testing.  Recommendation: Adjuvant CMF chemotherapy to begin in 2 weeks

## 2023-10-10 ENCOUNTER — Telehealth: Payer: Self-pay | Admitting: *Deleted

## 2023-10-10 ENCOUNTER — Other Ambulatory Visit: Payer: Self-pay

## 2023-10-10 ENCOUNTER — Telehealth: Payer: Self-pay | Admitting: Radiation Oncology

## 2023-10-10 NOTE — Telephone Encounter (Signed)
 8/29 Patient call on status of her appts, but not ready for Chemo and/or Radiation treatments.  No referrals was received.  Secure sent to [facility], Nurse Navigator, so they are aware.

## 2023-10-10 NOTE — Telephone Encounter (Signed)
 Received a message that patient was asking about her appts.  This RN called patient and she stated she does not want to the chemo or the xrt.  I will let the team know. Informed her I would find out what follow up will look like for the future. Patient verbalized understanding.

## 2023-10-10 NOTE — Telephone Encounter (Signed)
 Spoke with patient again and let her know that she has a port in place for chemo. She seem somewhat confused. I asked her if she wanted me to call her daughter Joanne Morris. Patient would like me to call her daughter.  Spoke with Joanne Morris to let her know what her mom had told me and about the port.  She states they discussed this yesterday and she was good with everything.  She will talk with her over the weekend and let me know the final decision.  Gave contact information

## 2023-10-14 ENCOUNTER — Telehealth: Payer: Self-pay | Admitting: *Deleted

## 2023-10-14 ENCOUNTER — Other Ambulatory Visit: Payer: Self-pay | Admitting: Family

## 2023-10-14 DIAGNOSIS — I700 Atherosclerosis of aorta: Secondary | ICD-10-CM | POA: Diagnosis not present

## 2023-10-14 DIAGNOSIS — G4700 Insomnia, unspecified: Secondary | ICD-10-CM

## 2023-10-14 DIAGNOSIS — I340 Nonrheumatic mitral (valve) insufficiency: Secondary | ICD-10-CM | POA: Diagnosis not present

## 2023-10-14 MED ORDER — ZOLPIDEM TARTRATE ER 12.5 MG PO TBCR: 12.5000 mg | EXTENDED_RELEASE_TABLET | Freq: Every day | ORAL | 5 refills | Status: DC

## 2023-10-14 NOTE — Telephone Encounter (Signed)
 Let Joanne Morris know Dr Aron has to refill the Oxycodone  I'm afraid. How is she feeling after her lumpectomy (other than having pain of course)? I have sent in the Zolpidem .

## 2023-10-14 NOTE — Telephone Encounter (Signed)
 Received call back from daughter stating they had discussed this weekend and patient will move forward with Chemo followed by xrt.  Will let the team know.

## 2023-10-14 NOTE — Telephone Encounter (Signed)
 Copied from CRM #[id number]. Topic: Clinical - Medication Refill >> Oct 14, 2023  8:56 AM Rosina BIRCH wrote: Medication: zolpidem  (AMBIEN  CR) 12.5 MG CR tablet and  oxyCODONE   Has the patient contacted their pharmacy? No (Agent: If no, request that the patient contact the pharmacy for the refill. If patient does not wish to contact the pharmacy document the reason why and proceed with request.) (Agent: If yes, when and what did the pharmacy advise?)  This is the patient's preferred pharmacy:  [facility] [address], [place] - [address] [place] [address] Phone: [phone] Fax: [phone]  Is this the correct pharmacy for this prescription? Yes If no, delete pharmacy and type the correct one.   Has the prescription been filled recently? Yes  Is the patient out of the medication? No  Has the patient been seen for an appointment in the last year OR does the patient have an upcoming appointment? Yes  Can we respond through MyChart? Yes  Agent: Please be advised that Rx refills may take up to 3 business days. We ask that you follow-up with your pharmacy.

## 2023-10-16 ENCOUNTER — Ambulatory Visit: Admitting: Rehabilitation

## 2023-10-16 ENCOUNTER — Encounter: Payer: Self-pay | Admitting: *Deleted

## 2023-10-16 NOTE — Progress Notes (Signed)
 Pharmacist Chemotherapy Monitoring - Initial Assessment    Anticipated start date: 10/24/23   The following has been reviewed per standard work regarding the patient's treatment regimen: The patient's diagnosis, treatment plan and drug doses, and organ/hematologic function Lab orders and baseline tests specific to treatment regimen  The treatment plan start date, drug sequencing, and pre-medications Prior authorization status  Patient's documented medication list, including drug-drug interaction screen and prescriptions for anti-emetics and supportive care specific to the treatment regimen The drug concentrations, fluid compatibility, administration routes, and timing of the medications to be used The patient's access for treatment and lifetime cumulative dose history, if applicable  The patient's medication allergies and previous infusion related reactions, if applicable   Changes made to treatment plan:  N/A  Follow up needed:  Pending authorization for treatment  Category D drug interaction between methotrexate  and glipizide : Sulfonylureas may increase serum concentrations of Methotrexate . Avoid coadministration of methotrexate  with sulfonylureas if possible. - in basket message sent to Dr. Harlin Harlene JONELLE Tamela, [facility], 10/16/2023  3:16 PM

## 2023-10-20 ENCOUNTER — Telehealth: Payer: Self-pay

## 2023-10-20 NOTE — Telephone Encounter (Signed)
 Notified the pt daughter regarding her FMLA forms being completed, faxed, and confirmation received. Pt daughter request for her copy to be mailed to her home. No questions or concerns to be noted at this time.

## 2023-10-21 NOTE — Progress Notes (Unsigned)
 [facility]       Telephone: [phone]?Fax: [phone]   Oncology Clinical Pharmacist Practitioner Initial Assessment  Joanne Morris is a 73 y.o. female with a diagnosis of breast cancer. They were contacted today via in-person visit.  Indication/Regimen CMF: cyclophosphamide , methotrexate , fluorouracil  are being used appropriately for treatment of breast cancer by Dr. Vinay Gudena.      Wt Readings from Last 1 Encounters:  09/25/23 141 lb (64 kg)    Estimated body surface area is 1.63 meters squared as calculated from the following:   Height as of 09/25/23: 4' 11 (1.499 m).   Weight as of 09/25/23: 141 lb (64 kg).  The dosing regimen is every 21 days for 6 cycles  Cyclophosphamide  (600 mg/m2) on Day 1 Methotrexate  (40 mg/m2) on Day 1 Fluorouracil  (600 mg/m2) on Day 1  Dose Modifications Dr. Odean has made the following adjustments: cyclophosphamide  (500 mg/m2), methotrexate  (30 mg/m2), fluorouracil  (400 mg/m2)   Allergies No Known Allergies  Vitals    09/25/2023    1:15 PM 09/25/2023    1:03 PM 09/25/2023    1:01 PM  Oncology Vitals  Temp 97.7 F (36.5 C)    Pulse Rate 54 58 57  BP 136/61    Resp 15 16 18   SpO2 97 % 98 % 91 %     Laboratory Data    Latest Ref Rng & Units 09/19/2023    2:38 PM 08/13/2023   12:15 PM 10/22/2022   10:04 AM  CBC EXTENDED  WBC 4.0 - 10.5 K/uL 8.7  8.2  9.2   RBC 3.87 - 5.11 MIL/uL 5.40  5.41  5.16   Hemoglobin 12.0 - 15.0 g/dL 86.8  86.7  87.6   HCT 36.0 - 46.0 % 42.2  41.8  40.3   Platelets 150 - 400 K/uL 304  302  340.0   NEUT# 1.7 - 7.7 K/uL  4.7  5.3   Lymph# 0.7 - 4.0 K/uL  2.8  3.2        Latest Ref Rng & Units 09/19/2023    2:38 PM 08/13/2023   12:15 PM 10/22/2022   10:04 AM  CMP  Glucose 70 - 99 mg/dL 91  781  862   BUN 8 - 23 mg/dL 18  16  20    Creatinine 0.44 - 1.00 mg/dL 8.85  8.85  8.72   Sodium 135 - 145 mmol/L 142  139  140   Potassium 3.5 - 5.1 mmol/L 4.0  4.9  4.1   Chloride 98 - 111 mmol/L  109  103  104   CO2 22 - 32 mmol/L 23  26  28    Calcium  8.9 - 10.3 mg/dL 89.6  89.5  89.5   Total Protein 6.5 - 8.1 g/dL  8.0  8.0   Total Bilirubin 0.0 - 1.2 mg/dL  0.4  0.3   Alkaline Phos 38 - 126 U/L  80  83   AST 15 - 41 U/L  26  22   ALT 0 - 44 U/L  17  16    Lab Results  Component Value Date   MG 1.7 05/31/2011   Contraindications Contraindications were reviewed? Yes Contraindications to therapy were identified? No   Safety Precautions The following safety precautions for the use of CMF were reviewed:  Fever: reviewed the importance of having a thermometer and the Centers for Disease Control and Prevention (CDC) definition of fever which is 100.70F (38C) or higher. Patient should call 24/7  triage at [phone] if experiencing a fever or any other symptoms Decreased hemoglobin Decreased platelets Decreased white blood cells Fatigue Diarrhea Mucositis Nausea Vomiting Alopecia Skin reactions Kidney injury Liver injury Cardiotoxicity Secondary malignancies Drug-Drug interactions Conditions such as pleural effusions, ascites VTE Pneumonitis Importance of drinking plenty of fluids Handling body fluids and waste Intimacy, sexual activity, contraception, and fertility if applicable  Medication Reconciliation Current Outpatient Medications  Medication Sig Dispense Refill   acetaminophen  (TYLENOL ) 650 MG CR tablet Take 1 tablet (650 mg total) by mouth every evening. FOR PAIN. (Patient taking differently: Take 650 mg by mouth in the morning.) 30 tablet 11   acetaminophen -codeine  (TYLENOL  #4) 300-60 MG tablet Take 1 tablet by mouth every 4 (four) hours as needed for moderate pain (pain score 4-6). 30 tablet 2   amLODipine  (NORVASC ) 10 MG tablet TAKE ONE TABLET BY MOUTH DAILY AT 9AM (VIAL) 90 tablet 3   aspirin  EC 81 MG tablet Take 81 mg by mouth daily in the afternoon.     busPIRone  (BUSPAR ) 5 MG tablet TAKE ONE TABLET (5 MG TOTAL) BY MOUTH TWICE DAILY 60 tablet 5    calcium  carbonate (OSCAL) 1500 (600 Ca) MG TABS tablet Take 600 mg by mouth in the morning.     Cholecalciferol (VITAMIN D3) 125 MCG (5000 UT) TABS Take 5,000 Units by mouth daily.     cilostazol  (PLETAL ) 100 MG tablet TAKE ONE TABLET (100 MG TOTAL) BY MOUTH TWICE DAILY 180 tablet 11   diphenoxylate -atropine  (LOMOTIL ) 2.5-0.025 MG tablet Take 1 tablet by mouth 4 (four) times daily as needed for diarrhea or loose stools (Max dose of 8 tabs in 24 hours). 30 tablet 1   FARXIGA  10 MG TABS tablet TAKE ONE TABLET (10MG  TOTAL) BY MOUTH IN THE MORNING DAILY AT 9AM 90 tablet 11   glipiZIDE  (GLUCOTROL  XL) 5 MG 24 hr tablet TAKE ONE TABLET (5 MG TOTAL) BY MOUTH DAILY WITH BREAKFAST 90 tablet 11   hydrALAZINE  (APRESOLINE ) 50 MG tablet TAKE ONE TABLET (50 MG TOTAL) BY MOUTH THREE TIMES DAILY 270 tablet 11   levocetirizine (XYZAL ) 5 MG tablet Take 1 tablet (5 mg total) by mouth daily as needed for allergies. 90 tablet 3   lidocaine -prilocaine  (EMLA ) cream Apply to affected area once 30 g 3   LINZESS  145 MCG CAPS capsule TAKE ONE CAPSULE ( TOTAL) BY MOUTH EVERY DAY (Patient taking differently: Take 145 mcg by mouth daily as needed (constipation.).) 90 capsule 11   metoprolol  tartrate (LOPRESSOR ) 100 MG tablet TAKE ONE TABLET BY MOUTH TWICE DAILY @9AM -5PM 180 tablet 1   niacin  (VITAMIN B3) 500 MG ER tablet Take 1 tablet (500 mg total) by mouth daily. (Patient taking differently: Take 500 mg by mouth at bedtime.) 90 tablet 3   ondansetron  (ZOFRAN ) 8 MG tablet Take 1 tablet (8 mg total) by mouth every 8 (eight) hours as needed for nausea or vomiting. Start on the third day after chemotherapy. 30 tablet 1   oxyCODONE  (OXY IR/ROXICODONE ) 5 MG immediate release tablet Take 1 tablet (5 mg total) by mouth every 6 (six) hours as needed for severe pain (pain score 7-10). 10 tablet 0   pravastatin  (PRAVACHOL ) 40 MG tablet TAKE ONE TABLET BY MOUTH DAILY AT 5PM (VIAL) 90 tablet 3   prochlorperazine  (COMPAZINE ) 10 MG tablet  Take 1 tablet (10 mg total) by mouth every 6 (six) hours as needed for nausea or vomiting. 30 tablet 1   sertraline  (ZOLOFT ) 100 MG tablet TAKE ONE TABLET BY  MOUTH DAILY AT 9AM IN ADDITION TO 50 MG TABLET (VIAL) 90 tablet 3   sertraline  (ZOLOFT ) 50 MG tablet TAKE ONE TABLET BY MOUTH DAILY AT 5PM EVERY EVENING 90 tablet 3   valsartan  (DIOVAN ) 320 MG tablet TAKE ONE TABLET BY MOUTH DAILY AT 9AM 100 tablet 11   zolpidem  (AMBIEN  CR) 12.5 MG CR tablet Take 1 tablet (12.5 mg total) by mouth at bedtime. 30 tablet 5   No current facility-administered medications for this visit.    Medication reconciliation is based on the patient's most recent medication list in the electronic medical record (EMR) including herbal products and OTC medications.   The patient's medication list was reviewed today with the patient? Yes   Drug-drug interactions (DDIs) DDIs were evaluated? Yes Significant DDIs identified? Patient on low dose aspirin  (81 mg) which may increase concentrations of methotrexate . Dr. Gudena and patient made aware  Drug-Food Interactions Drug-food interactions were evaluated? Yes Drug-food interactions identified? No   Follow-up Plan  Treatment start date: 10/24/23 Port placement date: 09/25/23 We reviewed the prescriptions, premedications, and treatment regimen with the patient. Possible side effects of the treatment regimen were reviewed and management strategies were discussed.  Can use over-the-counter (OTC) options of loperamide (Imodium) as needed for diarrhea, and docusate + senna (Senna-S) as needed for constipation.  Clinical pharmacy will assist Dr. Vinay Gudena and Joanne Morris on an as needed basis going forward  Joanne Morris participated in the discussion, expressed understanding, and voiced agreement with the above plan. All questions were answered to her satisfaction. The patient was advised to contact the clinic at [phone] with any questions or concerns prior to her  return visit.   I spent 60 minutes assessing the patient.  Jonuel Butterfield A. Lucila, PharmD, BCOP, CPP  Norleen DELENA Lucila, RPH-CPP, 10/21/2023 9:07 AM  **Disclaimer: This note was dictated with voice recognition software. Similar sounding words can inadvertently be transcribed and this note may contain transcription errors which may not have been corrected upon publication of note.**

## 2023-10-22 ENCOUNTER — Inpatient Hospital Stay: Attending: Hematology and Oncology

## 2023-10-22 ENCOUNTER — Inpatient Hospital Stay: Admitting: Pharmacist

## 2023-10-22 VITALS — BP 151/68 | HR 60 | Temp 97.2°F | Resp 18 | Ht 59.0 in | Wt 146.2 lb

## 2023-10-22 DIAGNOSIS — Z171 Estrogen receptor negative status [ER-]: Secondary | ICD-10-CM | POA: Insufficient documentation

## 2023-10-22 DIAGNOSIS — Z5111 Encounter for antineoplastic chemotherapy: Secondary | ICD-10-CM | POA: Diagnosis not present

## 2023-10-22 DIAGNOSIS — C50412 Malignant neoplasm of upper-outer quadrant of left female breast: Secondary | ICD-10-CM | POA: Diagnosis not present

## 2023-10-22 DIAGNOSIS — Z800 Family history of malignant neoplasm of digestive organs: Secondary | ICD-10-CM | POA: Diagnosis not present

## 2023-10-22 DIAGNOSIS — Z87891 Personal history of nicotine dependence: Secondary | ICD-10-CM | POA: Diagnosis not present

## 2023-10-22 DIAGNOSIS — Z809 Family history of malignant neoplasm, unspecified: Secondary | ICD-10-CM | POA: Insufficient documentation

## 2023-10-23 ENCOUNTER — Telehealth: Payer: Self-pay | Admitting: Family

## 2023-10-23 DIAGNOSIS — C50912 Malignant neoplasm of unspecified site of left female breast: Secondary | ICD-10-CM | POA: Diagnosis not present

## 2023-10-23 NOTE — Telephone Encounter (Signed)
 [facility] faxed [phone] form, to be filled out by provider. Patient requested to send it back via Fax within ASAP. Document is located in providers tray at front office.Please advise at [phone].

## 2023-10-24 ENCOUNTER — Inpatient Hospital Stay

## 2023-10-24 ENCOUNTER — Encounter: Payer: Self-pay | Admitting: Hematology and Oncology

## 2023-10-24 ENCOUNTER — Inpatient Hospital Stay (HOSPITAL_BASED_OUTPATIENT_CLINIC_OR_DEPARTMENT_OTHER): Admitting: Adult Health

## 2023-10-24 ENCOUNTER — Encounter: Payer: Self-pay | Admitting: Adult Health

## 2023-10-24 VITALS — BP 137/60 | HR 61 | Temp 97.9°F | Resp 18 | Ht 59.0 in | Wt 144.1 lb

## 2023-10-24 DIAGNOSIS — C50412 Malignant neoplasm of upper-outer quadrant of left female breast: Secondary | ICD-10-CM

## 2023-10-24 DIAGNOSIS — Z171 Estrogen receptor negative status [ER-]: Secondary | ICD-10-CM

## 2023-10-24 DIAGNOSIS — Z800 Family history of malignant neoplasm of digestive organs: Secondary | ICD-10-CM | POA: Diagnosis not present

## 2023-10-24 DIAGNOSIS — Z809 Family history of malignant neoplasm, unspecified: Secondary | ICD-10-CM | POA: Diagnosis not present

## 2023-10-24 DIAGNOSIS — Z5111 Encounter for antineoplastic chemotherapy: Secondary | ICD-10-CM | POA: Diagnosis not present

## 2023-10-24 DIAGNOSIS — Z87891 Personal history of nicotine dependence: Secondary | ICD-10-CM | POA: Diagnosis not present

## 2023-10-24 LAB — CMP (CANCER CENTER ONLY)
ALT: 18 U/L (ref 0–44)
AST: 21 U/L (ref 15–41)
Albumin: 4.7 g/dL (ref 3.5–5.0)
Alkaline Phosphatase: 94 U/L (ref 38–126)
Anion gap: 7 (ref 5–15)
BUN: 20 mg/dL (ref 8–23)
CO2: 29 mmol/L (ref 22–32)
Calcium: 10.2 mg/dL (ref 8.9–10.3)
Chloride: 104 mmol/L (ref 98–111)
Creatinine: 1.13 mg/dL — ABNORMAL HIGH (ref 0.44–1.00)
GFR, Estimated: 51 mL/min — ABNORMAL LOW (ref >60)
Glucose, Bld: 142 mg/dL — ABNORMAL HIGH (ref 70–99)
Potassium: 4.1 mmol/L (ref 3.5–5.1)
Sodium: 140 mmol/L (ref 135–145)
Total Bilirubin: 0.4 mg/dL (ref 0.0–1.2)
Total Protein: 8 g/dL (ref 6.5–8.1)

## 2023-10-24 LAB — CBC WITH DIFFERENTIAL (CANCER CENTER ONLY)
Abs Immature Granulocytes: 0.05 K/uL (ref 0.00–0.07)
Basophils Absolute: 0 K/uL (ref 0.0–0.1)
Basophils Relative: 0 %
Eosinophils Absolute: 0.1 K/uL (ref 0.0–0.5)
Eosinophils Relative: 2 %
HCT: 39.9 % (ref 36.0–46.0)
Hemoglobin: 12.6 g/dL (ref 12.0–15.0)
Immature Granulocytes: 1 %
Lymphocytes Relative: 39 %
Lymphs Abs: 3.2 K/uL (ref 0.7–4.0)
MCH: 24.4 pg — ABNORMAL LOW (ref 26.0–34.0)
MCHC: 31.6 g/dL (ref 30.0–36.0)
MCV: 77.2 fL — ABNORMAL LOW (ref 80.0–100.0)
Monocytes Absolute: 0.6 K/uL (ref 0.1–1.0)
Monocytes Relative: 7 %
Neutro Abs: 4.2 K/uL (ref 1.7–7.7)
Neutrophils Relative %: 51 %
Platelet Count: 268 K/uL (ref 150–400)
RBC: 5.17 MIL/uL — ABNORMAL HIGH (ref 3.87–5.11)
RDW: 15.8 % — ABNORMAL HIGH (ref 11.5–15.5)
WBC Count: 8.1 K/uL (ref 4.0–10.5)
nRBC: 0 % (ref 0.0–0.2)

## 2023-10-24 MED ORDER — METHOTREXATE SODIUM CHEMO INJECTION (PF) 50 MG/2ML
30.0000 mg/m2 | Freq: Once | INTRAMUSCULAR | Status: AC
  Administered 2023-10-24: 49.25 mg via INTRAVENOUS
  Filled 2023-10-24: qty 1.97

## 2023-10-24 MED ORDER — FLUOROURACIL CHEMO INJECTION 2.5 GM/50ML
400.0000 mg/m2 | Freq: Once | INTRAVENOUS | Status: AC
  Administered 2023-10-24: 650 mg via INTRAVENOUS
  Filled 2023-10-24: qty 13

## 2023-10-24 MED ORDER — SODIUM CHLORIDE 0.9 % IV SOLN
500.0000 mg/m2 | Freq: Once | INTRAVENOUS | Status: AC
  Administered 2023-10-24: 820 mg via INTRAVENOUS
  Filled 2023-10-24: qty 41

## 2023-10-24 MED ORDER — DEXAMETHASONE SODIUM PHOSPHATE 10 MG/ML IJ SOLN
10.0000 mg | Freq: Once | INTRAMUSCULAR | Status: AC
  Administered 2023-10-24: 10 mg via INTRAVENOUS
  Filled 2023-10-24: qty 1

## 2023-10-24 MED ORDER — PALONOSETRON HCL INJECTION 0.25 MG/5ML
0.2500 mg | Freq: Once | INTRAVENOUS | Status: AC
  Administered 2023-10-24: 0.25 mg via INTRAVENOUS
  Filled 2023-10-24: qty 5

## 2023-10-24 MED ORDER — SODIUM CHLORIDE 0.9 % IV SOLN: INTRAVENOUS | Status: DC

## 2023-10-24 NOTE — Assessment & Plan Note (Signed)
 08/11/2023:Screening mammogram detected left breast mass upper outer quadrant 1.9 cm by ultrasound axilla negative biopsy: Grade 2 IDC triple negative Ki-67 70%   Treatment plan: 09/25/2023: Left lumpectomy: Grade 3 IDC 2.9 cm, margins negative, LVI not identified, ER 0%, PR 0%, HER2 negative, Ki67 70%, 0/2 lymph nodes negative Adjuvant chemotherapy with CMF x 6 cycles (patient has cardiac disease, low performance status, diabetes) Adjuvant radiation therapy ---------------------------------------------------------------------------------------------------------------------------------------- Pathology counseling: I discussed the final pathology report of the patient provided  a copy of this report. I discussed the margins as well as lymph node surgeries. We also discussed the final staging along with previously performed ER/PR and HER-2/neu testing.  Recommendation: CMF x 6 cycles  Assessment and Plan Assessment & Plan Triple negative breast cancer, post-lumpectomy Status post lumpectomy for T2N0 triple negative breast cancer. Planned adjuvant CMF chemotherapy is appropriate and less intense. Treatment goal is curative. - Administer adjuvant chemotherapy with CMF regimen. - Monitor blood counts and organ function regularly. - Advise taking anti-nausea medication at dinnertime tonight - Encourage maintaining normal activity levels to prevent deconditioning. - Plan for port removal after completion of chemotherapy. - Instruct to report severe side effects such as persistent nausea, vomiting, or inability to stay hydrated.  RTC in 3 weeks for labs, f/u, and next treatment.

## 2023-10-24 NOTE — Patient Instructions (Signed)
 [facility] - A DEPT OF [facility]  Discharge Instructions: Thank you for choosing [facility] to provide your oncology and hematology care.   If you have a lab appointment with the Cancer Center, please go directly to the Cancer Center and check in at the registration area.   Wear comfortable clothing and clothing appropriate for easy access to any Portacath or PICC line.   We strive to give you quality time with your provider. You may need to reschedule your appointment if you arrive late (15 or more minutes).  Arriving late affects you and other patients whose appointments are after yours.  Also, if you miss three or more appointments without notifying the office, you may be dismissed from the clinic at the provider's discretion.      For prescription refill requests, have your pharmacy contact our office and allow 72 hours for refills to be completed.    Today you received the following chemotherapy and/or immunotherapy agents: cyclophosphamide  (CYTOXAN ), fluorouracil  (ADRUCIL ), methotrexate     To help prevent nausea and vomiting after your treatment, we encourage you to take your nausea medication as directed.  BELOW ARE SYMPTOMS THAT SHOULD BE REPORTED IMMEDIATELY: *FEVER GREATER THAN 100.4 F (38 C) OR HIGHER *CHILLS OR SWEATING *NAUSEA AND VOMITING THAT IS NOT CONTROLLED WITH YOUR NAUSEA MEDICATION *UNUSUAL SHORTNESS OF BREATH *UNUSUAL BRUISING OR BLEEDING *URINARY PROBLEMS (pain or burning when urinating, or frequent urination) *BOWEL PROBLEMS (unusual diarrhea, constipation, pain near the anus) TENDERNESS IN MOUTH AND THROAT WITH OR WITHOUT PRESENCE OF ULCERS (sore throat, sores in mouth, or a toothache) UNUSUAL RASH, SWELLING OR PAIN  UNUSUAL VAGINAL DISCHARGE OR ITCHING   Items with * indicate a potential emergency and should be followed up as soon as possible or go to the Emergency Department if any problems should  occur.  Please show the CHEMOTHERAPY ALERT CARD or IMMUNOTHERAPY ALERT CARD at check-in to the Emergency Department and triage nurse.  Should you have questions after your visit or need to cancel or reschedule your appointment, please contact [facility] - A DEPT OF [facility]  Dept: [phone]  and follow the prompts.  Office hours are 8:00 a.m. to 4:30 p.m. Monday - Friday. Please note that voicemails left after 4:00 p.m. may not be returned until the following business day.  We are closed weekends and major holidays. You have access to a nurse at all times for urgent questions. Please call the main number to the clinic Dept: [phone] and follow the prompts.   For any non-urgent questions, you may also contact your provider using MyChart. We now offer e-Visits for anyone 66 and older to request care online for non-urgent symptoms. For details visit [email or web address].   Also download the MyChart app! Go to the app store, search MyChart, open the app, select [place], and log in with your MyChart username and password.

## 2023-10-24 NOTE — Progress Notes (Signed)
 [facility] Cancer Follow up:    Lucius Krabbe, NP [address] [place] [address]   DIAGNOSIS:  Cancer Staging  Malignant neoplasm of upper-outer quadrant of left breast in female, estrogen receptor negative (HCC) Staging form: Breast, AJCC 8th Edition - Clinical stage from 08/13/2023: Stage IB (cT1c, cN0, cM0, G2, ER-, PR-, HER2-) - Signed by Odean Potts, MD on 08/13/2023 Stage prefix: Initial diagnosis Histologic grading system: 3 grade system Laterality: Left Staged by: Pathologist and managing physician Stage used in treatment planning: Yes National guidelines used in treatment planning: Yes Type of national guideline used in treatment planning: NCCN - Pathologic stage from 09/25/2023: Stage IIA (pT2, pN0, cM0, G3, ER-, PR-, HER2-) - Signed by Crawford Morna Pickle, NP on 10/24/2023 Stage prefix: Initial diagnosis Histologic grading system: 3 grade system    SUMMARY OF ONCOLOGIC HISTORY: Oncology History  Malignant neoplasm of upper-outer quadrant of left breast in female, estrogen receptor negative (HCC)  08/11/2023 Initial Diagnosis   Screening mammogram detected left breast mass upper outer quadrant 1.9 cm by ultrasound axilla negative biopsy: Grade 2 IDC triple negative Ki-67 70%   08/13/2023 Cancer Staging   Staging form: Breast, AJCC 8th Edition - Clinical stage from 08/13/2023: Stage IB (cT1c, cN0, cM0, G2, ER-, PR-, HER2-) - Signed by Odean Potts, MD on 08/13/2023 Stage prefix: Initial diagnosis Histologic grading system: 3 grade system Laterality: Left Staged by: Pathologist and managing physician Stage used in treatment planning: Yes National guidelines used in treatment planning: Yes Type of national guideline used in treatment planning: NCCN    Genetic Testing   [facility] CancerNext-Expanded Panel+RNA was Negative. Report date is 08/23/2023.   The CancerNext-Expanded gene panel offered by [facility] and includes sequencing,  rearrangement, and RNA analysis for the following 77 genes: AIP, ALK, APC, ATM, AXIN2, BAP1, BARD1, BMPR1A, BRCA1, BRCA2, BRIP1, CDC73, CDH1, CDK4, CDKN1B, CDKN2A, CEBPA, CHEK2, CTNNA1, DDX41, DICER1, ETV6, FH, FLCN, GATA2, LZTR1, MAX, MBD4, MEN1, MET, MLH1, MSH2, MSH3, MSH6, MUTYH, NF1, NF2, NTHL1, PALB2, PHOX2B, PMS2, POT1, PRKAR1A, PTCH1, PTEN, RAD51C, RAD51D, RB1, RET, RPS20, RUNX1, SDHA, SDHAF2, SDHB, SDHC, SDHD, SMAD4, SMARCA4, SMARCB1, SMARCE1, STK11, SUFU, TMEM127, TP53, TSC1, TSC2, VHL, and WT1 (sequencing and deletion/duplication); EGFR, HOXB13, KIT, MITF, PDGFRA, POLD1, and POLE (sequencing only); EPCAM and GREM1 (deletion/duplication only).     09/25/2023 Cancer Staging   Staging form: Breast, AJCC 8th Edition - Pathologic stage from 09/25/2023: Stage IIA (pT2, pN0, cM0, G3, ER-, PR-, HER2-) - Signed by Crawford Morna Pickle, NP on 10/24/2023 Stage prefix: Initial diagnosis Histologic grading system: 3 grade system   10/24/2023 -  Chemotherapy   Patient is on Treatment Plan : BREAST CMF IV q21d       CURRENT THERAPY: CMF  INTERVAL HISTORY:  Discussed the use of AI scribe software for clinical note transcription with the patient, who gave verbal consent to proceed.  History of Present Illness Joanne Morris is a 73 year old female with T2N0 triple negative breast cancer who presents for follow-up evaluation prior to receiving adjuvant chemotherapy with CMF.  She underwent a lumpectomy in August 2025 for T2N0 triple negative breast cancer. She is scheduled to start adjuvant chemotherapy with the CMF regimen, which includes six doses. She is aware of potential side effects, including nausea and decreased blood counts. She is experiencing emotional distress after the recent loss of a close friend to multiple myeloma.     Patient Active Problem List   Diagnosis Date Noted  Genetic testing 08/25/2023   Family history of colon cancer    Malignant neoplasm of upper-outer quadrant of  left breast in female, estrogen receptor negative (HCC) 08/11/2023   Primary osteoarthritis of right knee 04/25/2023   Mass of upper outer quadrant of left breast 04/23/2023   Diarrhea 11/07/2022   Generalized anxiety disorder 06/18/2022   Chronic allergic rhinitis 03/19/2022   Drug-induced constipation 12/18/2021   Stage 3a chronic kidney disease (HCC) 05/01/2021   Chronic pain syndrome 12/26/2020   Insomnia 12/26/2020   Keloid scar 09/14/2019   Aortic valve sclerosis 01/04/2019   H/O heart artery stent 01/04/2019   Bilateral carotid artery stenosis 12/10/2017   H/O carotid endarterectomy 12/10/2017   Old MI (myocardial infarction) 12/10/2017   PAD (peripheral artery disease) (HCC) 07/20/2013   Dyslipidemia 10/11/2012   Depression 10/11/2012   CAD (coronary artery disease) 05/31/2011   HTN (hypertension) 05/31/2011   DM2 (diabetes mellitus, type 2) (HCC) 05/31/2011    has no known allergies.  MEDICAL HISTORY: Past Medical History:  Diagnosis Date   Abdominal pain 10/11/2012   Acute kidney injury superimposed on CKD (HCC) 08/01/2018   Anemia 05/31/2011   Iron deficiency   Angina    Anxiety    Arthritis    knees (07/20/2013)   Atypical chest pain 05/31/2011   Cancer (HCC) 2025   Left Breast Cancer   Carotid stenosis, asymptomatic 04/18/2014   Claudication (HCC)    Coronary artery disease 2005   Stent placed to RCA   COVID-19 virus infection 08/01/2018   Depression    Family history of colon cancer    GERD (gastroesophageal reflux disease)    Heart murmur    Hematoma of neck 04/23/2014   High cholesterol    Hyperlipemia 05/31/2011   Hypertension    Hypokalemia 05/31/2011   Leucocytosis 05/31/2011   Myocardial infarction [facility]) 1990's   1   Neck pain on right side 10/04/2014   PAD (peripheral artery disease) (HCC)    Peripheral arterial disease (HCC) 06/08/2013   Peripheral arterial disease   Radiculopathy of leg 10/11/2012   Stroke (HCC)    mini stroke  1st then regular stroke, denies residual on 07/20/2013   Type 2 diabetes mellitus with diabetic peripheral angiopathy without gangrene, without long-term current use of insulin  (HCC) 08/13/2023   Wears glasses     SURGICAL HISTORY: Past Surgical History:  Procedure Laterality Date   ABDOMINAL HYSTERECTOMY     ATHERECTOMY  10/05/2013   BALLOON ANGIOPLASTY, ARTERY  10/05/2013   DR LADONA   BREAST BIOPSY Left 08/05/2023   US  LT BREAST BX W LOC DEV 1ST LESION IMG BX SPEC US  GUIDE 08/05/2023 [facility]MO[facility]   BREAST BIOPSY Left 09/23/2023   US  LT RADIOACTIVE SEED LOC 09/23/2023 [facility]AMMO[facility]   BREAST LUMPECTOMY WITH RADIOACTIVE SEED LOCALIZATION Left 09/25/2023   Procedure: BREAST LUMPECTOMY WITH RADIOACTIVE SEED LOCALIZATION;  Surgeon: Aron Shoulders, MD;  Location: [facility] OR;  Service: General;  Laterality: Left;  LEFT BREAST SEED LOCALIZATION LUMPECTOMY   CAROTID ENDARTERECTOMY     CORONARY ANGIOPLASTY WITH STENT PLACEMENT     1   ENDARTERECTOMY Left 02/07/2014   Procedure: ENDARTERECTOMY CAROTID;  Surgeon: Carlin FORBES Haddock, MD;  Location: [facility] OR;  Service: Vascular;  Laterality: Left;   ENDARTERECTOMY Right 04/18/2014   Procedure: ENDARTERECTOMY RIGHT CAROTID;  Surgeon: Carlin FORBES Haddock, MD;  Location: [facility] OR;  Service: Vascular;  Laterality: Right;   ENDARTERECTOMY N/A 04/24/2014   Procedure: IRRIGATION AND DEBRIDEMENT OF RIGHT NECK ;  Surgeon: Carlin FORBES Haddock, MD;  Location: [facility] OR;  Service: Vascular;  Laterality: N/A;   ESOPHAGOGASTRODUODENOSCOPY N/A 10/13/2012   Procedure: ESOPHAGOGASTRODUODENOSCOPY (EGD);  Surgeon: Lupita FORBES Commander, MD;  Location: [facility] ENDOSCOPY;  Service: Endoscopy;  Laterality: N/A;   KENALOG  INJECTION Bilateral 08/22/2016   Procedure: KENALOG  INJECTION BILATERAL NECK;  Surgeon: Lowery Estefana RAMAN, DO;  Location: [facility];  Service: Plastics;  Laterality: Bilateral;   LOWER EXTREMITY ANGIOGRAM  07/20/2013   Unsuccessful attempt at crossing the CTO/notes  07/20/2013   LOWER EXTREMITY ANGIOGRAM N/A 07/06/2013   Procedure: LOWER EXTREMITY ANGIOGRAM;  Surgeon: Erick JONELLE Bergamo, MD;  Location: [facility] CATH LAB;  Service: Cardiovascular;  Laterality: N/A;   LOWER EXTREMITY ANGIOGRAM N/A 07/20/2013   Procedure: LOWER EXTREMITY ANGIOGRAM;  Surgeon: Erick JONELLE Bergamo, MD;  Location: [facility] CATH LAB;  Service: Cardiovascular;  Laterality: N/A;   LOWER EXTREMITY ANGIOGRAM N/A 10/05/2013   Procedure: LOWER EXTREMITY ANGIOGRAM;  Surgeon: Erick JONELLE Bergamo, MD;  Location: [facility] CATH LAB;  Service: Cardiovascular;  Laterality: N/A;   MASS EXCISION Right 08/22/2016   Procedure: EXCISION RIGHT NECK KELOID;  Surgeon: Lowery Estefana RAMAN, DO;  [facility];  Service: Plastics;  Laterality: Right;   MASS EXCISION Right 09/18/2020   Procedure: Excision of right neck keloid;  Surgeon: Lowery Estefana RAMAN, DO;  Location: [facility] OR;  Service: Plastics;  Laterality: Right;   PATCH ANGIOPLASTY Left 02/07/2014   Procedure: PATCH ANGIOPLASTY Carotid;  Surgeon: Carlin FORBES Haddock, MD;  Location: [facility] OR;  Service: Vascular;  Laterality: Left;   PATCH ANGIOPLASTY Right 04/18/2014   Procedure: PATCH ANGIOPLASTY USING HEMASHIELD 0.8cmx 7.6cm PATCH;  Surgeon: Carlin FORBES Haddock, MD;  Location: [facility] OR;  Service: Vascular;  Laterality: Right;   PERIPHERAL VASCULAR CATHETERIZATION N/A 07/05/2014   Procedure: Lower Extremity Angiography;  Surgeon: Gordy Bergamo, MD;  Location: [facility] INVASIVE CV LAB;  Service: Cardiovascular;  Laterality: N/A;   PORTACATH PLACEMENT N/A 09/25/2023   Procedure: INSERTION, TUNNELED CENTRAL VENOUS DEVICE, WITH PORT;  Surgeon: Aron Shoulders, MD;  Location: [facility] OR;  Service: General;  Laterality: N/A;   SENTINEL NODE BIOPSY N/A 09/25/2023   Procedure: BIOPSY, LYMPH NODE, SENTINEL;  Surgeon: Aron Shoulders, MD;  Location: [facility] OR;  Service: General;  Laterality: N/A;   TUBAL LIGATION      SOCIAL HISTORY: Social History   Socioeconomic History   Marital status: Legally  Separated    Spouse name: Not on file   Number of children: Not on file   Years of education: Not on file   Highest education level: Not on file  Occupational History   Not on file  Tobacco Use   Smoking status: Former    Current packs/day: 0.00    Average packs/day: 0.5 packs/day for 4.0 years (2.0 ttl pk-yrs)    Types: Cigarettes    Start date: 05/30/2005    Quit date: 05/30/2009    Years since quitting: 14.4   Smokeless tobacco: Never  Vaping Use   Vaping status: Never Used  Substance and Sexual Activity   Alcohol use: No    Alcohol/week: 0.0 standard drinks of alcohol   Drug use: No   Sexual activity: Yes    Birth control/protection: Post-menopausal  Other Topics Concern   Not on file  Social History Narrative   Not on file   Social Drivers of Health   Financial Resource Strain: Low Risk  (08/13/2023)   Overall Financial Resource Strain (CARDIA)    Difficulty of Paying  Living Expenses: Not hard at all  Food Insecurity: No Food Insecurity (08/25/2023)   Hunger Vital Sign    Worried About Running Out of Food in the Last Year: Never true    Ran Out of Food in the Last Year: Never true  Transportation Needs: No Transportation Needs (08/25/2023)   PRAPARE - Administrator, Civil Service (Medical): No    Lack of Transportation (Non-Medical): No  Physical Activity: Sufficiently Active (08/13/2023)   Exercise Vital Sign    Days of Exercise per Week: 5 days    Minutes of Exercise per Session: 30 min  Stress: No Stress Concern Present (08/13/2023)   [employer] of Occupational Health - Occupational Stress Questionnaire    Feeling of Stress: Not at all  Social Connections: Moderately Isolated (08/13/2023)   Social Connection and Isolation Panel    Frequency of Communication with Friends and Family: More than three times a week    Frequency of Social Gatherings with Friends and Family: More than three times a week    Attends Religious Services: More than 4 times per  year    Active Member of [employer] or Organizations: No    Attends Banker Meetings: Never    Marital Status: Separated  Intimate Partner Violence: Not At Risk (08/25/2023)   Humiliation, Afraid, Rape, and Kick questionnaire    Fear of Current or Ex-Partner: No    Emotionally Abused: No    Physically Abused: No    Sexually Abused: No    FAMILY HISTORY: Family History  Problem Relation Age of Onset   Cancer Mother        unknown form, d. < 50   Colon cancer Brother        d. > 50    Review of Systems  Constitutional:  Negative for appetite change, chills, fatigue, fever and unexpected weight change.  HENT:   Negative for hearing loss, lump/mass and trouble swallowing.   Eyes:  Negative for eye problems and icterus.  Respiratory:  Negative for chest tightness, cough and shortness of breath.   Cardiovascular:  Negative for chest pain, leg swelling and palpitations.  Gastrointestinal:  Negative for abdominal distention, abdominal pain, constipation, diarrhea, nausea and vomiting.  Endocrine: Negative for hot flashes.  Genitourinary:  Negative for difficulty urinating.   Musculoskeletal:  Negative for arthralgias.  Skin:  Negative for itching and rash.  Neurological:  Negative for dizziness, extremity weakness, headaches and numbness.  Hematological:  Negative for adenopathy. Does not bruise/bleed easily.  Psychiatric/Behavioral:  Negative for depression. The patient is not nervous/anxious.       PHYSICAL EXAMINATION   Onc Performance Status - 10/24/23 0900       KPS SCALE   KPS % SCORE Able to carry on normal activity, minor s/s of disease          Vitals:   10/24/23 0910  BP: 137/60  Pulse: 61  Resp: 18  Temp: 97.9 F (36.6 C)  SpO2: 100%    Physical Exam Constitutional:      General: She is not in acute distress.    Appearance: Normal appearance. She is not toxic-appearing.  HENT:     Head: Normocephalic and atraumatic.     Mouth/Throat:      Mouth: Mucous membranes are moist.     Pharynx: Oropharynx is clear. No oropharyngeal exudate or posterior oropharyngeal erythema.  Eyes:     General: No scleral icterus. Cardiovascular:     Rate and Rhythm:  Normal rate and regular rhythm.     Pulses: Normal pulses.     Heart sounds: Normal heart sounds.  Pulmonary:     Effort: Pulmonary effort is normal.     Breath sounds: Normal breath sounds.  Abdominal:     General: Abdomen is flat. Bowel sounds are normal. There is no distension.     Palpations: Abdomen is soft.     Tenderness: There is no abdominal tenderness.  Musculoskeletal:        General: No swelling.     Cervical back: Neck supple.  Lymphadenopathy:     Cervical: No cervical adenopathy.  Skin:    General: Skin is warm and dry.     Findings: No rash.  Neurological:     General: No focal deficit present.     Mental Status: She is alert.  Psychiatric:        Mood and Affect: Mood normal.        Behavior: Behavior normal.     LABORATORY DATA:  CBC    Component Value Date/Time   WBC 8.1 10/24/2023 0840   WBC 8.7 09/19/2023 1438   RBC 5.17 (H) 10/24/2023 0840   HGB 12.6 10/24/2023 0840   HCT 39.9 10/24/2023 0840   PLT 268 10/24/2023 0840   MCV 77.2 (L) 10/24/2023 0840   MCH 24.4 (L) 10/24/2023 0840   MCHC 31.6 10/24/2023 0840   RDW 15.8 (H) 10/24/2023 0840   LYMPHSABS 3.2 10/24/2023 0840   MONOABS 0.6 10/24/2023 0840   EOSABS 0.1 10/24/2023 0840   BASOSABS 0.0 10/24/2023 0840    CMP     Component Value Date/Time   NA 140 10/24/2023 0840   NA 141 12/29/2020 0000   K 4.1 10/24/2023 0840   CL 104 10/24/2023 0840   CO2 29 10/24/2023 0840   GLUCOSE 142 (H) 10/24/2023 0840   BUN 20 10/24/2023 0840   BUN 22 (A) 12/29/2020 0000   CREATININE 1.13 (H) 10/24/2023 0840   CALCIUM  10.2 10/24/2023 0840   PROT 8.0 10/24/2023 0840   ALBUMIN 4.7 10/24/2023 0840   AST 21 10/24/2023 0840   ALT 18 10/24/2023 0840   ALKPHOS 94 10/24/2023 0840   BILITOT 0.4  10/24/2023 0840   GFRNONAA 51 (L) 10/24/2023 0840   GFRAA 45 12/29/2020 0000     ASSESSMENT and THERAPY PLAN:   Malignant neoplasm of upper-outer quadrant of left breast in female, estrogen receptor negative (HCC) 08/11/2023:Screening mammogram detected left breast mass upper outer quadrant 1.9 cm by ultrasound axilla negative biopsy: Grade 2 IDC triple negative Ki-67 70%   Treatment plan: 09/25/2023: Left lumpectomy: Grade 3 IDC 2.9 cm, margins negative, LVI not identified, ER 0%, PR 0%, HER2 negative, Ki67 70%, 0/2 lymph nodes negative Adjuvant chemotherapy with CMF x 6 cycles (patient has cardiac disease, low performance status, diabetes) Adjuvant radiation therapy ---------------------------------------------------------------------------------------------------------------------------------------- Pathology counseling: I discussed the final pathology report of the patient provided  a copy of this report. I discussed the margins as well as lymph node surgeries. We also discussed the final staging along with previously performed ER/PR and HER-2/neu testing.  Recommendation: CMF x 6 cycles  Assessment and Plan Assessment & Plan Triple negative breast cancer, post-lumpectomy Status post lumpectomy for T2N0 triple negative breast cancer. Planned adjuvant CMF chemotherapy is appropriate and less intense. Treatment goal is curative. - Administer adjuvant chemotherapy with CMF regimen. - Monitor blood counts and organ function regularly. - Advise taking anti-nausea medication at dinnertime tonight - Encourage maintaining normal activity  levels to prevent deconditioning. - Plan for port removal after completion of chemotherapy. - Instruct to report severe side effects such as persistent nausea, vomiting, or inability to stay hydrated.  RTC in 3 weeks for labs, f/u, and next treatment.    All questions were answered. The patient knows to call the clinic with any problems, questions or  concerns. We can certainly see the patient much sooner if necessary.  Total encounter time:30 minutes*in face-to-face visit time, chart review, lab review, care coordination, order entry, and documentation of the encounter time.    Morna Kendall, NP 10/24/23 1:23 PM Medical Oncology and Hematology [facility] [address] [place], [place] [address] Tel. [phone]    Fax. [phone]  *Total Encounter Time as defined by the Centers for Medicare and Medicaid Services includes, in addition to the face-to-face time of a patient visit (documented in the note above) non-face-to-face time: obtaining and reviewing outside history, ordering and reviewing medications, tests or procedures, care coordination (communications with other health care professionals or caregivers) and documentation in the medical record.

## 2023-10-24 NOTE — Telephone Encounter (Signed)
 Form has already been Received, Pt needs an appointment.

## 2023-10-27 ENCOUNTER — Telehealth: Payer: Self-pay

## 2023-10-27 ENCOUNTER — Telehealth: Payer: Self-pay | Admitting: *Deleted

## 2023-10-27 NOTE — Telephone Encounter (Signed)
 error

## 2023-10-27 NOTE — Telephone Encounter (Signed)
-----   Message from Nurse Milo KIDD sent at 10/24/2023  3:33 PM EDT ----- Regarding: First time chemo: cyclophosphamide , fluorouracil , and methotrexate  Dr. Gara patient. Forst time  cyclophosphamide , fluorouracil , and methotrexate , she tolerated it well.

## 2023-10-27 NOTE — Telephone Encounter (Signed)
 LM for patient that this nurse was calling to see how they were doing after their treatment. Please call back to Dr. Earmon Phoenix nurse at [phone] if they have any questions or concerns regarding the treatment.

## 2023-11-03 ENCOUNTER — Ambulatory Visit: Admitting: Rehabilitation

## 2023-11-03 ENCOUNTER — Telehealth: Payer: Self-pay | Admitting: Family

## 2023-11-03 NOTE — Telephone Encounter (Signed)
 I returned call, Forms were received back in July but at that time pt cancelled appointment and this does require an office visit. I let Olam from [facility] know pt has a OV scheduled for 9/23 at 9:30 am and forms will be complete at visit. Olam Verbalized understanding.

## 2023-11-03 NOTE — Telephone Encounter (Signed)
 Copied from CRM #[id number]. Topic: General - Other >> Nov 03, 2023  8:42 AM Willma R wrote: Reason for CRM: Olam from [employer] faxed a [employer] form to the office on 07/22 and 09/11 and has not received the form back. Is calling for an update.  Olam can be reached at [phone]

## 2023-11-04 ENCOUNTER — Encounter: Payer: Self-pay | Admitting: Family

## 2023-11-04 ENCOUNTER — Ambulatory Visit: Admitting: Family

## 2023-11-04 ENCOUNTER — Telehealth: Payer: Self-pay | Admitting: Rehabilitation

## 2023-11-04 ENCOUNTER — Ambulatory Visit: Attending: General Surgery | Admitting: Rehabilitation

## 2023-11-04 VITALS — BP 117/64 | HR 71 | Temp 97.5°F | Ht 59.0 in | Wt 144.0 lb

## 2023-11-04 DIAGNOSIS — N183 Chronic kidney disease, stage 3 unspecified: Secondary | ICD-10-CM

## 2023-11-04 DIAGNOSIS — Z171 Estrogen receptor negative status [ER-]: Secondary | ICD-10-CM | POA: Insufficient documentation

## 2023-11-04 DIAGNOSIS — R262 Difficulty in walking, not elsewhere classified: Secondary | ICD-10-CM | POA: Insufficient documentation

## 2023-11-04 DIAGNOSIS — C50412 Malignant neoplasm of upper-outer quadrant of left female breast: Secondary | ICD-10-CM

## 2023-11-04 DIAGNOSIS — I2510 Atherosclerotic heart disease of native coronary artery without angina pectoris: Secondary | ICD-10-CM

## 2023-11-04 DIAGNOSIS — I739 Peripheral vascular disease, unspecified: Secondary | ICD-10-CM | POA: Diagnosis not present

## 2023-11-04 DIAGNOSIS — E1122 Type 2 diabetes mellitus with diabetic chronic kidney disease: Secondary | ICD-10-CM | POA: Diagnosis not present

## 2023-11-04 DIAGNOSIS — Z23 Encounter for immunization: Secondary | ICD-10-CM | POA: Diagnosis not present

## 2023-11-04 DIAGNOSIS — I152 Hypertension secondary to endocrine disorders: Secondary | ICD-10-CM | POA: Diagnosis not present

## 2023-11-04 DIAGNOSIS — G894 Chronic pain syndrome: Secondary | ICD-10-CM

## 2023-11-04 DIAGNOSIS — E785 Hyperlipidemia, unspecified: Secondary | ICD-10-CM

## 2023-11-04 DIAGNOSIS — R293 Abnormal posture: Secondary | ICD-10-CM | POA: Insufficient documentation

## 2023-11-04 DIAGNOSIS — F3341 Major depressive disorder, recurrent, in partial remission: Secondary | ICD-10-CM

## 2023-11-04 DIAGNOSIS — F5101 Primary insomnia: Secondary | ICD-10-CM | POA: Diagnosis not present

## 2023-11-04 MED ORDER — ACETAMINOPHEN-CODEINE 300-60 MG PO TABS
1.0000 | ORAL_TABLET | Freq: Two times a day (BID) | ORAL | 2 refills | Status: DC | PRN
Start: 2023-11-04 — End: 2023-11-08

## 2023-11-04 NOTE — Patient Instructions (Signed)
 It was very nice to see you today!  I am glad to hear you are doing better after your breast biopsy and tolerating chemo treatment so far.  Let me know if you need anything!  I have sent in your refills. Please schedule a 4 month follow up visit.      PLEASE NOTE:  If you had any lab tests please let us  know if you have not heard back within a few days. You may see your results on MyChart before we have a chance to review them but we will give you a call once they are reviewed by us . If we ordered any referrals today, please let us  know if you have not heard from their office within the next week.

## 2023-11-04 NOTE — Therapy (Deleted)
 OUTPATIENT PHYSICAL THERAPY BREAST CANCER POST OP FOLLOW UP   Patient Name: Joanne Morris MRN: [id number] DOB:08-20-1950, 73 y.o., female Today's Date: 11/04/2023  END OF SESSION:   Past Medical History:  Diagnosis Date   Abdominal pain 10/11/2012   Acute kidney injury superimposed on CKD 08/01/2018   Anemia 05/31/2011   Iron deficiency   Angina    Anxiety    Arthritis    knees (07/20/2013)   Atypical chest pain 05/31/2011   Cancer (HCC) 2025   Left Breast Cancer   Carotid stenosis, asymptomatic 04/18/2014   Claudication    Coronary artery disease 2005   Stent placed to RCA   COVID-19 virus infection 08/01/2018   Depression    Family history of colon cancer    GERD (gastroesophageal reflux disease)    Heart murmur    Hematoma of neck 04/23/2014   High cholesterol    Hyperlipemia 05/31/2011   Hypertension    Hypokalemia 05/31/2011   Leucocytosis 05/31/2011   Myocardial infarction [facility]) 1990's   1   Neck pain on right side 10/04/2014   PAD (peripheral artery disease)    Peripheral arterial disease 06/08/2013   Peripheral arterial disease   Radiculopathy of leg 10/11/2012   Stroke (HCC)    mini stroke 1st then regular stroke, denies residual on 07/20/2013   Type 2 diabetes mellitus with diabetic peripheral angiopathy without gangrene, without long-term current use of insulin  (HCC) 08/13/2023   Wears glasses    Past Surgical History:  Procedure Laterality Date   ABDOMINAL HYSTERECTOMY     ATHERECTOMY  10/05/2013   BALLOON ANGIOPLASTY, ARTERY  10/05/2013   DR LADONA   BREAST BIOPSY Left 08/05/2023   US  LT BREAST BX W LOC DEV 1ST LESION IMG BX SPEC US  GUIDE 08/05/2023 [facility]   BREAST BIOPSY Left 09/23/2023   US  LT RADIOACTIVE SEED LOC 09/23/2023 [facility]   BREAST LUMPECTOMY WITH RADIOACTIVE SEED LOCALIZATION Left 09/25/2023   Procedure: BREAST LUMPECTOMY WITH RADIOACTIVE SEED LOCALIZATION;  Surgeon: Aron Shoulders, MD;  Location: [facility] OR;  Service:  General;  Laterality: Left;  LEFT BREAST SEED LOCALIZATION LUMPECTOMY   CAROTID ENDARTERECTOMY     CORONARY ANGIOPLASTY WITH STENT PLACEMENT     1   ENDARTERECTOMY Left 02/07/2014   Procedure: ENDARTERECTOMY CAROTID;  Surgeon: Carlin FORBES Haddock, MD;  Location: [facility] OR;  Service: Vascular;  Laterality: Left;   ENDARTERECTOMY Right 04/18/2014   Procedure: ENDARTERECTOMY RIGHT CAROTID;  Surgeon: Carlin FORBES Haddock, MD;  Location: [facility] OR;  Service: Vascular;  Laterality: Right;   ENDARTERECTOMY N/A 04/24/2014   Procedure: IRRIGATION AND DEBRIDEMENT OF RIGHT NECK ;  Surgeon: Carlin FORBES Haddock, MD;  Location: [facility] OR;  Service: Vascular;  Laterality: N/A;   ESOPHAGOGASTRODUODENOSCOPY N/A 10/13/2012   Procedure: ESOPHAGOGASTRODUODENOSCOPY (EGD);  Surgeon: Lupita FORBES Commander, MD;  Location: [facility] ENDOSCOPY;  Service: Endoscopy;  Laterality: N/A;   KENALOG  INJECTION Bilateral 08/22/2016   Procedure: KENALOG  INJECTION BILATERAL NECK;  Surgeon: Lowery Estefana RAMAN, DO;  Location: [facility];  Service: Plastics;  Laterality: Bilateral;   LOWER EXTREMITY ANGIOGRAM  07/20/2013   Unsuccessful attempt at crossing the CTO/notes 07/20/2013   LOWER EXTREMITY ANGIOGRAM N/A 07/06/2013   Procedure: LOWER EXTREMITY ANGIOGRAM;  Surgeon: Erick JONELLE LADONA, MD;  Location: [facility] CATH LAB;  Service: Cardiovascular;  Laterality: N/A;   LOWER EXTREMITY ANGIOGRAM N/A 07/20/2013   Procedure: LOWER EXTREMITY ANGIOGRAM;  Surgeon: Erick JONELLE LADONA, MD;  Location: [facility] CATH LAB;  Service: Cardiovascular;  Laterality: N/A;  LOWER EXTREMITY ANGIOGRAM N/A 10/05/2013   Procedure: LOWER EXTREMITY ANGIOGRAM;  Surgeon: Erick JONELLE Bergamo, MD;  Location: [facility] CATH LAB;  Service: Cardiovascular;  Laterality: N/A;   MASS EXCISION Right 08/22/2016   Procedure: EXCISION RIGHT NECK KELOID;  Surgeon: Lowery Estefana RAMAN, DO;  Location: [facility];  Service: Plastics;  Laterality: Right;   MASS EXCISION Right 09/18/2020   Procedure: Excision of  right neck keloid;  Surgeon: Lowery Estefana RAMAN, DO;  Location: [facility] OR;  Service: Plastics;  Laterality: Right;   PATCH ANGIOPLASTY Left 02/07/2014   Procedure: PATCH ANGIOPLASTY Carotid;  Surgeon: Carlin FORBES Haddock, MD;  Location: [facility] OR;  Service: Vascular;  Laterality: Left;   PATCH ANGIOPLASTY Right 04/18/2014   Procedure: PATCH ANGIOPLASTY USING HEMASHIELD 0.8cmx 7.6cm PATCH;  Surgeon: Carlin FORBES Haddock, MD;  Location: [facility] OR;  Service: Vascular;  Laterality: Right;   PERIPHERAL VASCULAR CATHETERIZATION N/A 07/05/2014   Procedure: Lower Extremity Angiography;  Surgeon: Gordy Bergamo, MD;  Location: [facility] INVASIVE CV LAB;  Service: Cardiovascular;  Laterality: N/A;   PORTACATH PLACEMENT N/A 09/25/2023   Procedure: INSERTION, TUNNELED CENTRAL VENOUS DEVICE, WITH PORT;  Surgeon: Aron Shoulders, MD;  Location: [facility] OR;  Service: General;  Laterality: N/A;   SENTINEL NODE BIOPSY N/A 09/25/2023   Procedure: BIOPSY, LYMPH NODE, SENTINEL;  Surgeon: Aron Shoulders, MD;  Location: [facility] OR;  Service: General;  Laterality: N/A;   TUBAL LIGATION     Patient Active Problem List   Diagnosis Date Noted   Genetic testing 08/25/2023   Family history of colon cancer    Malignant neoplasm of upper-outer quadrant of left breast in female, estrogen receptor negative (HCC) 08/11/2023   Primary osteoarthritis of right knee 04/25/2023   Mass of upper outer quadrant of left breast 04/23/2023   Diarrhea 11/07/2022   Generalized anxiety disorder 06/18/2022   Chronic allergic rhinitis 03/19/2022   Drug-induced constipation 12/18/2021   Stage 3a chronic kidney disease (HCC) 05/01/2021   Chronic pain syndrome 12/26/2020   Insomnia 12/26/2020   Keloid scar 09/14/2019   Aortic valve sclerosis 01/04/2019   H/O heart artery stent 01/04/2019   Bilateral carotid artery stenosis 12/10/2017   H/O carotid endarterectomy 12/10/2017   Old MI (myocardial infarction) 12/10/2017   PAD (peripheral artery disease) 07/20/2013   Dyslipidemia  10/11/2012   Depression 10/11/2012   CAD (coronary artery disease) 05/31/2011   Hypertension 05/31/2011   DM2 (diabetes mellitus, type 2) (HCC) 05/31/2011    PCP: ***  REFERRING PROVIDER: Dr. Shoulders Aron  REFERRING DIAG: left breast cancer   THERAPY DIAG:  No diagnosis found.  Rationale for Evaluation and Treatment: Rehabilitation  ONSET DATE: 07/03/23  SUBJECTIVE:  SUBJECTIVE STATEMENT: ***  PERTINENT HISTORY:  Patient was diagnosed on 07/03/2023 with left grade 2 invasive ductal carcinoma breast cancer. It measures 1.9 cm and is located in the upper outer quadrant. It is triple negative with a Ki67 of 70%. She ambulates with a cane and has a cardiac stent. Pt is post lumpectomy 09/25/23 with 2 negative lymph nodes removed.  Pt started chemotherapy [facility] on 10/24/23.   PATIENT GOALS:  Reassess how my recovery is going related to arm function, pain, and swelling.  PAIN:  Are you having pain? {OPRCPAIN:27236}  PRECAUTIONS: Recent Surgery, {RIGHT/LEFT:21944} UE Lymphedema risk, {Therapy precautions:24002}  RED FLAGS: {PT Red Flags:29287}   ACTIVITY LEVEL / LEISURE: ***   OBJECTIVE:   PATIENT SURVEYS:  QUICK DASH: From 0%  OBSERVATIONS: ***  POSTURE:  ***  LYMPHEDEMA ASSESSMENT:  UPPER EXTREMITY AROM/PROM:   A/PROM RIGHT   eval    Shoulder extension 41  Shoulder flexion 138  Shoulder abduction 149  Shoulder internal rotation 43  Shoulder external rotation 77                          (Blank rows = not tested)   A/PROM LEFT   eval  Shoulder extension 37  Shoulder flexion 127  Shoulder abduction 142  Shoulder internal rotation 44  Shoulder external rotation 80                          (Blank rows = not tested)     UPPER EXTREMITY STRENGTH: WFL   LYMPHEDEMA ASSESSMENTS  (in cm):    LANDMARK RIGHT   eval  10 cm proximal to olecranon process 29.5  Olecranon process 24.1  10 cm proximal to ulnar styloid process 21.1  Just proximal to ulnar styloid process 16  Across hand at thumb web space 19.5  At base of 2nd digit 7  (Blank rows = not tested)   LANDMARK LEFT   eval  10 cm proximal to olecranon process 28.5  Olecranon process 25.1  10 cm proximal to ulnar styloid process 22  Just proximal to ulnar styloid process 16.3  Across hand at thumb web space 19.5  At base of 2nd digit 6.9  (Blank rows = not tested)  Surgery type/Date: *** Number of lymph nodes removed: *** Current/past treatment (chemo, radiation, hormone therapy): *** Other symptoms:  Heaviness/tightness {yes/no:20286} Pain {yes/no:20286} Pitting edema {yes/no:20286} Infections {yes/no:20286} Decreased scar mobility {yes/no:20286} Stemmer sign {yes/no:20286}  PATIENT EDUCATION:  Education details: *** Person educated: {Person educated:25204} Education method: {Education Method:25205} Education comprehension: {Education Comprehension:25206}  HOME EXERCISE PROGRAM: Reviewed previously given post op HEP. ***  ASSESSMENT:  CLINICAL IMPRESSION: ***  Pt will benefit from skilled therapeutic intervention to improve on the following deficits: Decreased knowledge of precautions, impaired UE functional use, pain, decreased ROM, postural dysfunction.   PT treatment/interventions: ADL/Self care home management, {rehab planned interventions:25118::97110-Therapeutic exercises,97530- Therapeutic [phone]- Neuromuscular re-education,97535- Self Rjmz,02859- Manual therapy,Patient/Family education}   GOALS: Goals reviewed with patient? {yes/no:20286}  GOALS MET AT EVAL:  GOALS Name Target Date Goal status  1 Pt will be able to verbalize understanding of pertinent lymphedema risk reduction practices relevant to her dx specifically related to skin care.  Baseline:  No  knowledge Eval Achieved at eval  2 Pt will be able to return demo and/or verbalize understanding of the post op HEP related to regaining shoulder ROM. Baseline:  No knowledge Eval Achieved at eval  3 Pt will be able to verbalize understanding of the importance of viewing the post op After Breast CA Class video for further lymphedema risk reduction education and therapeutic exercise.  Baseline:  No knowledge Eval Achieved at eval   LONG TERM GOALS:  (STG=LTG)  GOALS Name Target Date  Goal status  1 Pt will demonstrate she has regained full shoulder ROM and function post operatively compared to baselines.  Baseline: *** INITIAL  2  *** INITIAL  3  *** INITIAL  4  *** INITIAL     PLAN:  PT FREQUENCY/DURATION: ***  PLAN FOR NEXT SESSION: ***   [facility]  [address], [address]  [place] [address]  [phone]  After Breast Cancer Class Video It is recommended you view the ABC class video to be educated on lymphedema risk reduction. This video lasts for about 30 minutes. It can be viewed on our website here: [email or web address]  Scar massage You can begin gentle scar massage to you incision sites. Gently place one hand on the incision and move the skin (without sliding on the skin) in various directions. Do this for a few minutes and then you can gently massage either coconut oil or vitamin E cream into the scars.  Compression garment You should continue wearing your compression bra until you feel like you no longer have swelling.  Home exercise Program Continue doing the exercises you were given until you feel like you can do them without feeling any tightness at the end.   Walking Program Studies show that 30 minutes of walking per day (fast enough to elevate your heart rate) can significantly reduce the risk of a cancer recurrence. If you can't walk due to other medical reasons, we  encourage you to find another activity you could do (like a stationary bike or water exercise).  Posture After breast cancer surgery, people frequently sit with rounded shoulders posture because it puts their incisions on slack and feels better. If you sit like this and scar tissue forms in that position, you can become very tight and have pain sitting or standing with good posture. Try to be aware of your posture and sit and stand up tall to heal properly.  Follow up PT: It is recommended you return every 3 months for the first 3 years following surgery to be assessed on the SOZO machine for an L-Dex score. This helps prevent clinically significant lymphedema in 95% of patients. These follow up screens are 10 minute appointments that you are not billed for.  Larue Saddie SAUNDERS, PT 11/04/2023, 9:03 AM

## 2023-11-04 NOTE — Telephone Encounter (Signed)
 Pt had called to see if she could come in earlier than her 11am appointment at 9am as she was just finishing up with her other appointment.  Pt did not arrive until around 10am and then did not want to wait for original appointment time at 11am.  Pt's daughter reported they did not want to reschedule and would call back - per Nolia Muskrat.

## 2023-11-04 NOTE — Progress Notes (Signed)
 Patient ID: Joanne Morris, female    DOB: [birth year], 73 y.o.   MRN: [id number]  Chief Complaint  Patient presents with   Hypertension   Chronic pain syndrome   History of Present Illness   Joanne Morris is a 73 year old female with breast cancer who presents for follow-up and medication refills with her daughter, Chander.  She is undergoing chemotherapy following a lumpectomy for breast cancer. The port site remains slightly sensitive. She experiences fatigue and mild nausea post-chemotherapy, managed with medication. Her next chemotherapy session is scheduled for October 2nd, followed by radiation therapy.  She has been taking Tylenol  w/codeine  daily for sciatic nerve pain and recently was taking Oxycodone  for her recent breast lumpectomy pain but has finished this RX.  Her medications include Celebrex, Farxiga , metoprolol , pravastatin , niacin , valsartan , cilostazol , and sertraline , obtained through a mail-order pharmacy.  Hypertension and diabetes are managed with Farixga, Glipizide , metoprolol , valsartan , Cilostazol  is used for leg pain due to poor circulation, and sertraline  is for depression.     Assessment and Plan    Breast cancer, triple negative, post-lumpectomy, on chemotherapy Post-lumpectomy for triple negative left breast cancer. Undergoing [facility] chemotherapy. First round completed with mild nausea controlled by medication. Genetic testing negative. - Continue chemotherapy regimen as planned. - Schedule radiation therapy post-chemotherapy. - Will continue to follow  Chronic pain syndrome Chronic pain in sciatic region managed previously with hydrocodone , however, her insurance would no longer cover so RX changed to Tylenol  with codeine . She is unsure how this has worked for her as she has been on OXY for her breast pain which most likely helped her sciatic pain.  - Send refill for Tylenol  w/codeine  325-60mg  bid prn for pain management. - Monitor pain levels and adjust medication  as necessary. - F/U in 4 months  Peripheral vascular disease Peripheral vascular disease with poor leg circulation managed with cilostazol . - Continue cilostazol  for circulation improvement, no refill needed today. - F/U in 4-35mos  Type 2 diabetes mellitus w/CKD Type 2 diabetes managed with Glipizide  and Farxiga . A1C checked a month ago in hospital and wnl at 6.5. - Continue medications as prescribed, no refills needed today. - F/U in 4mos  Hypertension Hypertension managed with valsartan  qd and metoprolol  bid. - Continue valsartan  and metoprolol  as prescribed, no refills needed today. - F/U in 4 mos  Depression Depression managed with sertraline  150mg  daily. - Continue sertraline  as prescribed, with 2 separate prescriptions, no refills needed today. - F/U in 4-6 mos  Primary insomnia Primary insomnia managed with zolpidem . Advised against concurrent use with hydrocodone  due to drowsiness risk. - Continue zolpidem  12.5mg  CR for insomnia, refill recently sent. - Follow up in 4 mos      Subjective:    Outpatient Medications Prior to Visit  Medication Sig Dispense Refill   acetaminophen  (TYLENOL ) 650 MG CR tablet Take 1 tablet (650 mg total) by mouth every evening. FOR PAIN. 30 tablet 11   amLODipine  (NORVASC ) 10 MG tablet TAKE ONE TABLET BY MOUTH DAILY AT 9AM (VIAL) 90 tablet 3   aspirin  EC 81 MG tablet Take 81 mg by mouth daily in the afternoon.     busPIRone  (BUSPAR ) 5 MG tablet TAKE ONE TABLET (5 MG TOTAL) BY MOUTH TWICE DAILY 60 tablet 5   calcium  carbonate (OSCAL) 1500 (600 Ca) MG TABS tablet Take 600 mg by mouth in the morning.     Cholecalciferol (VITAMIN D3) 125 MCG (5000 UT) TABS Take 5,000 Units by mouth daily.  cilostazol  (PLETAL ) 100 MG tablet TAKE ONE TABLET (100 MG TOTAL) BY MOUTH TWICE DAILY 180 tablet 11   diphenoxylate -atropine  (LOMOTIL ) 2.5-0.025 MG tablet Take 1 tablet by mouth 4 (four) times daily as needed for diarrhea or loose stools (Max dose of 8 tabs  in 24 hours). 30 tablet 1   FARXIGA  10 MG TABS tablet TAKE ONE TABLET (10MG  TOTAL) BY MOUTH IN THE MORNING DAILY AT 9AM 90 tablet 11   glipiZIDE  (GLUCOTROL  XL) 5 MG 24 hr tablet TAKE ONE TABLET (5 MG TOTAL) BY MOUTH DAILY WITH BREAKFAST 90 tablet 11   levocetirizine (XYZAL ) 5 MG tablet Take 1 tablet (5 mg total) by mouth daily as needed for allergies. 90 tablet 3   lidocaine -prilocaine  (EMLA ) cream Apply to affected area once 30 g 3   LINZESS  145 MCG CAPS capsule TAKE ONE CAPSULE ( TOTAL) BY MOUTH EVERY DAY 90 capsule 11   metoprolol  tartrate (LOPRESSOR ) 100 MG tablet TAKE ONE TABLET BY MOUTH TWICE DAILY @9AM -5PM 180 tablet 1   niacin  (VITAMIN B3) 500 MG ER tablet Take 1 tablet (500 mg total) by mouth daily. 90 tablet 3   ondansetron  (ZOFRAN ) 8 MG tablet Take 1 tablet (8 mg total) by mouth every 8 (eight) hours as needed for nausea or vomiting. Start on the third day after chemotherapy. 30 tablet 1   pravastatin  (PRAVACHOL ) 40 MG tablet TAKE ONE TABLET BY MOUTH DAILY AT 5PM (VIAL) 90 tablet 3   prochlorperazine  (COMPAZINE ) 10 MG tablet Take 1 tablet (10 mg total) by mouth every 6 (six) hours as needed for nausea or vomiting. 30 tablet 1   sertraline  (ZOLOFT ) 100 MG tablet TAKE ONE TABLET BY MOUTH DAILY AT 9AM IN ADDITION TO 50 MG TABLET (VIAL) 90 tablet 3   sertraline  (ZOLOFT ) 50 MG tablet TAKE ONE TABLET BY MOUTH DAILY AT 5PM EVERY EVENING 90 tablet 3   valsartan  (DIOVAN ) 320 MG tablet TAKE ONE TABLET BY MOUTH DAILY AT 9AM 100 tablet 11   zolpidem  (AMBIEN  CR) 12.5 MG CR tablet Take 1 tablet (12.5 mg total) by mouth at bedtime. 30 tablet 5   oxyCODONE  (OXY IR/ROXICODONE ) 5 MG immediate release tablet Take 1 tablet (5 mg total) by mouth every 6 (six) hours as needed for severe pain (pain score 7-10). 10 tablet 0   LACTOBACILLUS PO Take 1 capsule by mouth.     No facility-administered medications prior to visit.   Past Medical History:  Diagnosis Date   Abdominal pain 10/11/2012   Acute  kidney injury superimposed on CKD 08/01/2018   Anemia 05/31/2011   Iron deficiency   Angina    Anxiety    Arthritis    knees (07/20/2013)   Atypical chest pain 05/31/2011   Cancer (HCC) 2025   Left Breast Cancer   Carotid stenosis, asymptomatic 04/18/2014   Claudication    Coronary artery disease 2005   Stent placed to RCA   COVID-19 virus infection 08/01/2018   Depression    Family history of colon cancer    GERD (gastroesophageal reflux disease)    Heart murmur    Hematoma of neck 04/23/2014   High cholesterol    Hyperlipemia 05/31/2011   Hypertension    Hypokalemia 05/31/2011   Leucocytosis 05/31/2011   Myocardial infarction [facility]) 1990's   1   Neck pain on right side 10/04/2014   PAD (peripheral artery disease)    Peripheral arterial disease 06/08/2013   Peripheral arterial disease   Radiculopathy of leg 10/11/2012   Stroke (  HCC)    mini stroke 1st then regular stroke, denies residual on 07/20/2013   Type 2 diabetes mellitus with diabetic peripheral angiopathy without gangrene, without long-term current use of insulin  (HCC) 08/13/2023   Wears glasses    Past Surgical History:  Procedure Laterality Date   ABDOMINAL HYSTERECTOMY     ATHERECTOMY  10/05/2013   BALLOON ANGIOPLASTY, ARTERY  10/05/2013   DR LADONA   BREAST BIOPSY Left 08/05/2023   US  LT BREAST BX W LOC DEV 1ST LESION IMG BX SPEC US  GUIDE 08/05/2023 [facility]   BREAST BIOPSY Left 09/23/2023   US  LT RADIOACTIVE SEED LOC 09/23/2023 [facility]   BREAST LUMPECTOMY WITH RADIOACTIVE SEED LOCALIZATION Left 09/25/2023   Procedure: BREAST LUMPECTOMY WITH RADIOACTIVE SEED LOCALIZATION;  Surgeon: Aron Shoulders, MD;  Location: [facility] OR;  Service: General;  Laterality: Left;  LEFT BREAST SEED LOCALIZATION LUMPECTOMY   CAROTID ENDARTERECTOMY     CORONARY ANGIOPLASTY WITH STENT PLACEMENT     1   ENDARTERECTOMY Left 02/07/2014   Procedure: ENDARTERECTOMY CAROTID;  Surgeon: Carlin FORBES Haddock, MD;  Location: [facility]  OR;  Service: Vascular;  Laterality: Left;   ENDARTERECTOMY Right 04/18/2014   Procedure: ENDARTERECTOMY RIGHT CAROTID;  Surgeon: Carlin FORBES Haddock, MD;  Location: [facility] OR;  Service: Vascular;  Laterality: Right;   ENDARTERECTOMY N/A 04/24/2014   Procedure: IRRIGATION AND DEBRIDEMENT OF RIGHT NECK ;  Surgeon: Carlin FORBES Haddock, MD;  Location: [facility] OR;  Service: Vascular;  Laterality: N/A;   ESOPHAGOGASTRODUODENOSCOPY N/A 10/13/2012   Procedure: ESOPHAGOGASTRODUODENOSCOPY (EGD);  Surgeon: Lupita FORBES Commander, MD;  Location: [facility] ENDOSCOPY;  Service: Endoscopy;  Laterality: N/A;   KENALOG  INJECTION Bilateral 08/22/2016   Procedure: KENALOG  INJECTION BILATERAL NECK;  Surgeon: Lowery Estefana RAMAN, DO;  Location: [facility];  Service: Plastics;  Laterality: Bilateral;   LOWER EXTREMITY ANGIOGRAM  07/20/2013   Unsuccessful attempt at crossing the CTO/notes 07/20/2013   LOWER EXTREMITY ANGIOGRAM N/A 07/06/2013   Procedure: LOWER EXTREMITY ANGIOGRAM;  Surgeon: Erick JONELLE LADONA, MD;  Location: [facility] CATH LAB;  Service: Cardiovascular;  Laterality: N/A;   LOWER EXTREMITY ANGIOGRAM N/A 07/20/2013   Procedure: LOWER EXTREMITY ANGIOGRAM;  Surgeon: Erick JONELLE LADONA, MD;  Location: [facility] CATH LAB;  Service: Cardiovascular;  Laterality: N/A;   LOWER EXTREMITY ANGIOGRAM N/A 10/05/2013   Procedure: LOWER EXTREMITY ANGIOGRAM;  Surgeon: Erick JONELLE LADONA, MD;  Location: [facility] CATH LAB;  Service: Cardiovascular;  Laterality: N/A;   MASS EXCISION Right 08/22/2016   Procedure: EXCISION RIGHT NECK KELOID;  Surgeon: Lowery Estefana RAMAN, DO;  Location: [facility];  Service: Plastics;  Laterality: Right;   MASS EXCISION Right 09/18/2020   Procedure: Excision of right neck keloid;  Surgeon: Lowery Estefana RAMAN, DO;  Location: [facility] OR;  Service: Plastics;  Laterality: Right;   PATCH ANGIOPLASTY Left 02/07/2014   Procedure: PATCH ANGIOPLASTY Carotid;  Surgeon: Carlin FORBES Haddock, MD;  Location: [facility] OR;  Service: Vascular;   Laterality: Left;   PATCH ANGIOPLASTY Right 04/18/2014   Procedure: PATCH ANGIOPLASTY USING HEMASHIELD 0.8cmx 7.6cm PATCH;  Surgeon: Carlin FORBES Haddock, MD;  Location: [facility] OR;  Service: Vascular;  Laterality: Right;   PERIPHERAL VASCULAR CATHETERIZATION N/A 07/05/2014   Procedure: Lower Extremity Angiography;  Surgeon: Gordy LADONA, MD;  Location: [facility] INVASIVE CV LAB;  Service: Cardiovascular;  Laterality: N/A;   PORTACATH PLACEMENT N/A 09/25/2023   Procedure: INSERTION, TUNNELED CENTRAL VENOUS DEVICE, WITH PORT;  Surgeon: Aron Shoulders, MD;  Location: [facility] OR;  Service: General;  Laterality: N/A;  SENTINEL NODE BIOPSY N/A 09/25/2023   Procedure: BIOPSY, LYMPH NODE, SENTINEL;  Surgeon: Aron Shoulders, MD;  Location: [facility] OR;  Service: General;  Laterality: N/A;   TUBAL LIGATION     No Known Allergies    Objective:    Physical Exam Vitals and nursing note reviewed.  Constitutional:      Appearance: Normal appearance.  Cardiovascular:     Rate and Rhythm: Normal rate and regular rhythm.  Pulmonary:     Effort: Pulmonary effort is normal.     Breath sounds: Normal breath sounds.  Musculoskeletal:        General: Normal range of motion.  Skin:    General: Skin is warm and dry.  Neurological:     Mental Status: She is alert.  Psychiatric:        Mood and Affect: Mood normal.        Behavior: Behavior normal.    BP 117/64 (BP Location: Right Arm, Patient Position: Sitting, Cuff Size: Large)   Pulse 71   Temp (!) 97.5 F (36.4 C) (Temporal)   Ht 4' 11 (1.499 m)   Wt 144 lb (65.3 kg)   SpO2 95%   BMI 29.08 kg/m  Wt Readings from Last 3 Encounters:  11/04/23 144 lb (65.3 kg)  10/24/23 144 lb 1.6 oz (65.4 kg)  10/22/23 146 lb 3.2 oz (66.3 kg)      Lucius Krabbe, NP

## 2023-11-06 ENCOUNTER — Telehealth: Payer: Self-pay | Admitting: Family

## 2023-11-06 NOTE — Telephone Encounter (Unsigned)
 Copied from CRM [id number]. Topic: Clinical - Prescription Issue >> Nov 06, 2023  3:30 PM Joanne Morris wrote: Reason for CRM: The pain medication she was prescribed Tuesday is not helping with the pain. Patient is requesting hydrocodone  or oxycodone  instead.   ----------------------------------------------------------------------- From previous Reason for Contact - Medication Refill: Medication:   Has the patient contacted their pharmacy?   (Agent: If no, request that the patient contact the pharmacy for the refill. If patient does not wish to contact the pharmacy document the reason why and proceed with request.) (Agent: If yes, when and what did the pharmacy advise?)  This is the patient's preferred pharmacy:   [facility] [address] [place] - [address] [place] [address] Phone: [phone] Fax: [phone]  Is this the correct pharmacy for this prescription?   If no, delete pharmacy and type the correct one.   Has the prescription been filled recently?    Is the patient out of the medication?    Has the patient been seen for an appointment in the last year OR does the patient have an upcoming appointment?    Can we respond through MyChart?    Agent: Please be advised that Rx refills may take up to 3 business days. We ask that you follow-up with your pharmacy.

## 2023-11-07 ENCOUNTER — Ambulatory Visit: Payer: Self-pay

## 2023-11-07 DIAGNOSIS — G894 Chronic pain syndrome: Secondary | ICD-10-CM

## 2023-11-07 NOTE — Telephone Encounter (Signed)
 FYI Only or Action Required?: Action required by provider: medication request.  Patient was last seen in primary care on 11/04/2023 by Lucius Krabbe, NP.  Called Nurse Triage reporting Med Change Request.  Symptoms began no triage.  Interventions attempted: Other: no triage.  Symptoms are: no triage.  Triage Disposition: See PCP When Office is Open (Within 3 Days)  Patient/caregiver understands and will follow disposition?: No, wishes to speak with PCP    Copied from CRM #[id number]. Topic: Clinical - Prescription Issue >> Nov 06, 2023  3:30 PM Darshell M wrote: Reason for CRM: The pain medication she was prescribed Tuesday is not helping with the pain. Patient is requesting hydrocodone  or oxycodone  instead.  Reason for Disposition  Prescription request for new medicine (not a refill)  Answer Assessment - Initial Assessment Questions 1. DRUG NAME: What medicine do you need to have refilled?     Oxycodone  or hydrocodone   Patient had OV on 11/04/23, she was rx tylenol  #4 for pain management, she is requesting change of medication to oxycodone  or hydrocodone .   Attempted to reach patient for triage but call was answered and immediately disconnected.  Protocols used: Medication Refill and Renewal Call-A-AH

## 2023-11-08 MED ORDER — HYDROCODONE-ACETAMINOPHEN 5-325 MG PO TABS: 1.0000 | ORAL_TABLET | Freq: Every day | ORAL | 0 refills | Status: DC | PRN

## 2023-11-08 NOTE — Addendum Note (Signed)
 Addended by: Jashawna Reever on: 11/08/2023 08:32 AM   Modules accepted: Orders

## 2023-11-10 MED ORDER — HYDROCODONE-ACETAMINOPHEN 5-325 MG PO TABS: 1.0000 | ORAL_TABLET | Freq: Every day | ORAL | 0 refills | Status: DC | PRN

## 2023-11-10 NOTE — Telephone Encounter (Signed)
 Pt states if RX AMBIEN  is sent to [facility], insurance will cover. Please re-send RX. Pt states she will contact pharmacy in regards to Hydrocodone  pricing and reach back out.

## 2023-11-10 NOTE — Telephone Encounter (Signed)
 Let Kelia know that I will send in the Hydrocodone  (hopefully by tomorrow, dealing with a glitch in the controlled substance system) but her insurance did not cover the last time & she may need to use the [employer] card to pay cash w/a coupon. Also, because these meds are controlled, she filled the Tylenol  w/codeine  on 9/23 and can't get another refill until 10/16, but she can try and see if pharmacy will fill it sooner.

## 2023-11-10 NOTE — Addendum Note (Signed)
 Addended by: Jawaan Adachi on: 11/10/2023 12:32 PM   Modules accepted: Orders

## 2023-11-11 ENCOUNTER — Telehealth: Payer: Self-pay

## 2023-11-11 DIAGNOSIS — G894 Chronic pain syndrome: Secondary | ICD-10-CM

## 2023-11-11 DIAGNOSIS — G4700 Insomnia, unspecified: Secondary | ICD-10-CM

## 2023-11-11 MED ORDER — HYDROCODONE-ACETAMINOPHEN 5-325 MG PO TABS: 1.0000 | ORAL_TABLET | Freq: Every day | ORAL | 0 refills | Status: DC | PRN

## 2023-11-11 MED ORDER — ZOLPIDEM TARTRATE ER 12.5 MG PO TBCR: 12.5000 mg | EXTENDED_RELEASE_TABLET | Freq: Every day | ORAL | 0 refills | Status: DC

## 2023-11-11 NOTE — Addendum Note (Signed)
 Addended by: Idabelle Mcpeters on: 11/11/2023 05:05 PM   Modules accepted: Orders

## 2023-11-11 NOTE — Telephone Encounter (Signed)
 both meds sent to [facility], but I can't send 90 pills of the Hydrocodone  as it is a narcotic and a higher schedule controlled drug than the Zolpidem . Is she no longer is using [facility]? Can we take this pharmacy out if not please.

## 2023-11-11 NOTE — Telephone Encounter (Signed)
 Copied from CRM [id number]. Topic: Clinical - Prescription Issue >> Nov 10, 2023  3:13 PM Franky GRADE wrote: Reason for CRM: Patient is calling regarding her zolpidem  (AMBIEN  CR) 12.5 MG CR tablet [[id number]] & HYDROcodone -acetaminophen  (NORCO/VICODIN) 5-325 MG tablet [[id number]] prescriptions. [facility] is charging the patient; however, [facility] is not and she would like to know if the prescription could be sent to them instead.

## 2023-11-13 ENCOUNTER — Inpatient Hospital Stay

## 2023-11-13 ENCOUNTER — Inpatient Hospital Stay: Admitting: Hematology and Oncology

## 2023-11-13 ENCOUNTER — Inpatient Hospital Stay: Attending: Hematology and Oncology

## 2023-11-13 VITALS — BP 130/64 | HR 67 | Temp 98.2°F | Resp 16 | Ht 59.0 in | Wt 146.7 lb

## 2023-11-13 DIAGNOSIS — Z5111 Encounter for antineoplastic chemotherapy: Secondary | ICD-10-CM | POA: Insufficient documentation

## 2023-11-13 DIAGNOSIS — C50412 Malignant neoplasm of upper-outer quadrant of left female breast: Secondary | ICD-10-CM | POA: Insufficient documentation

## 2023-11-13 DIAGNOSIS — Z171 Estrogen receptor negative status [ER-]: Secondary | ICD-10-CM

## 2023-11-13 DIAGNOSIS — Z79631 Long term (current) use of antimetabolite agent: Secondary | ICD-10-CM | POA: Diagnosis not present

## 2023-11-13 LAB — CMP (CANCER CENTER ONLY)
ALT: 23 U/L (ref 0–44)
AST: 25 U/L (ref 15–41)
Albumin: 4.4 g/dL (ref 3.5–5.0)
Alkaline Phosphatase: 90 U/L (ref 38–126)
Anion gap: 8 (ref 5–15)
BUN: 18 mg/dL (ref 8–23)
CO2: 28 mmol/L (ref 22–32)
Calcium: 10.2 mg/dL (ref 8.9–10.3)
Chloride: 104 mmol/L (ref 98–111)
Creatinine: 1.14 mg/dL — ABNORMAL HIGH (ref 0.44–1.00)
GFR, Estimated: 51 mL/min — ABNORMAL LOW (ref >60)
Glucose, Bld: 177 mg/dL — ABNORMAL HIGH (ref 70–99)
Potassium: 3.9 mmol/L (ref 3.5–5.1)
Sodium: 140 mmol/L (ref 135–145)
Total Bilirubin: 0.4 mg/dL (ref 0.0–1.2)
Total Protein: 8 g/dL (ref 6.5–8.1)

## 2023-11-13 LAB — CBC WITH DIFFERENTIAL (CANCER CENTER ONLY)
Abs Immature Granulocytes: 0.2 K/uL — ABNORMAL HIGH (ref 0.00–0.07)
Basophils Absolute: 0.1 K/uL (ref 0.0–0.1)
Basophils Relative: 1 %
Eosinophils Absolute: 0.1 K/uL (ref 0.0–0.5)
Eosinophils Relative: 1 %
HCT: 39.8 % (ref 36.0–46.0)
Hemoglobin: 12.6 g/dL (ref 12.0–15.0)
Immature Granulocytes: 2 %
Lymphocytes Relative: 37 %
Lymphs Abs: 3.2 K/uL (ref 0.7–4.0)
MCH: 24.7 pg — ABNORMAL LOW (ref 26.0–34.0)
MCHC: 31.7 g/dL (ref 30.0–36.0)
MCV: 77.9 fL — ABNORMAL LOW (ref 80.0–100.0)
Monocytes Absolute: 0.7 K/uL (ref 0.1–1.0)
Monocytes Relative: 8 %
Neutro Abs: 4.4 K/uL (ref 1.7–7.7)
Neutrophils Relative %: 51 %
Platelet Count: 319 K/uL (ref 150–400)
RBC: 5.11 MIL/uL (ref 3.87–5.11)
RDW: 16.4 % — ABNORMAL HIGH (ref 11.5–15.5)
WBC Count: 8.6 K/uL (ref 4.0–10.5)
nRBC: 0 % (ref 0.0–0.2)

## 2023-11-13 MED ORDER — PALONOSETRON HCL INJECTION 0.25 MG/5ML
0.2500 mg | Freq: Once | INTRAVENOUS | Status: AC
  Administered 2023-11-13: 0.25 mg via INTRAVENOUS
  Filled 2023-11-13: qty 5

## 2023-11-13 MED ORDER — FLUOROURACIL CHEMO INJECTION 2.5 GM/50ML
400.0000 mg/m2 | Freq: Once | INTRAVENOUS | Status: AC
  Administered 2023-11-13: 650 mg via INTRAVENOUS
  Filled 2023-11-13: qty 13

## 2023-11-13 MED ORDER — SODIUM CHLORIDE 0.9% FLUSH
10.0000 mL | INTRAVENOUS | Status: DC | PRN
  Administered 2023-11-13: 10 mL

## 2023-11-13 MED ORDER — SODIUM CHLORIDE 0.9 % IV SOLN
500.0000 mg/m2 | Freq: Once | INTRAVENOUS | Status: AC
  Administered 2023-11-13: 820 mg via INTRAVENOUS
  Filled 2023-11-13: qty 41

## 2023-11-13 MED ORDER — METHOTREXATE SODIUM CHEMO INJECTION (PF) 50 MG/2ML
30.0000 mg/m2 | Freq: Once | INTRAMUSCULAR | Status: AC
  Administered 2023-11-13: 49.25 mg via INTRAVENOUS
  Filled 2023-11-13: qty 1.97

## 2023-11-13 MED ORDER — DEXAMETHASONE SODIUM PHOSPHATE 10 MG/ML IJ SOLN
10.0000 mg | Freq: Once | INTRAMUSCULAR | Status: AC
  Administered 2023-11-13: 10 mg via INTRAVENOUS
  Filled 2023-11-13: qty 1

## 2023-11-13 MED ORDER — SODIUM CHLORIDE 0.9 % IV SOLN: INTRAVENOUS | Status: DC

## 2023-11-13 NOTE — Progress Notes (Signed)
 Patient Care Team: Lucius Krabbe, NP as PCP - General (Family Medicine) Tye Leach, NP as Nurse Practitioner [name], [name] [name], MD as Consulting Physician (Vascular Surgery) [name], MD as Attending Physician (Family Medicine) [name], RN as Oncology Nurse Navigator Aron Shoulders, MD as Consulting Physician (General Surgery) Odean Potts, MD as Consulting Physician (Hematology and Oncology) Shannon Agent, MD as Consulting Physician (Radiation Oncology)  DIAGNOSIS:  Encounter Diagnosis  Name Primary?   Malignant neoplasm of upper-outer quadrant of left breast in female, estrogen receptor negative (HCC) Yes    SUMMARY OF ONCOLOGIC HISTORY: Oncology History  Malignant neoplasm of upper-outer quadrant of left breast in female, estrogen receptor negative (HCC)  08/11/2023 Initial Diagnosis   Screening mammogram detected left breast mass upper outer quadrant 1.9 cm by ultrasound axilla negative biopsy: Grade 2 IDC triple negative Ki-67 70%   08/13/2023 Cancer Staging   Staging form: Breast, AJCC 8th Edition - Clinical stage from 08/13/2023: Stage IB (cT1c, cN0, cM0, G2, ER-, PR-, HER2-) - Signed by Odean Potts, MD on 08/13/2023 Stage prefix: Initial diagnosis Histologic grading system: 3 grade system Laterality: Left Staged by: Pathologist and managing physician Stage used in treatment planning: Yes National guidelines used in treatment planning: Yes Type of national guideline used in treatment planning: NCCN    Genetic Testing   [facility] CancerNext-Expanded Panel+RNA was Negative. Report date is 08/23/2023.   The CancerNext-Expanded gene panel offered by [facility] and includes sequencing, rearrangement, and RNA analysis for the following 77 genes: AIP, ALK, APC, ATM, AXIN2, BAP1, BARD1, BMPR1A, BRCA1, BRCA2, BRIP1, CDC73, CDH1, CDK4, CDKN1B, CDKN2A, CEBPA, CHEK2, CTNNA1, DDX41, DICER1, ETV6, FH, FLCN, GATA2, LZTR1, MAX, MBD4, MEN1, MET, MLH1, MSH2,  MSH3, MSH6, MUTYH, NF1, NF2, NTHL1, PALB2, PHOX2B, PMS2, POT1, PRKAR1A, PTCH1, PTEN, RAD51C, RAD51D, RB1, RET, RPS20, RUNX1, SDHA, SDHAF2, SDHB, SDHC, SDHD, SMAD4, SMARCA4, SMARCB1, SMARCE1, STK11, SUFU, TMEM127, TP53, TSC1, TSC2, VHL, and WT1 (sequencing and deletion/duplication); EGFR, HOXB13, KIT, MITF, PDGFRA, POLD1, and POLE (sequencing only); EPCAM and GREM1 (deletion/duplication only).     09/25/2023 Cancer Staging   Staging form: Breast, AJCC 8th Edition - Pathologic stage from 09/25/2023: Stage IIA (pT2, pN0, cM0, G3, ER-, PR-, HER2-) - Signed by Crawford Morna Pickle, NP on 10/24/2023 Stage prefix: Initial diagnosis Histologic grading system: 3 grade system   10/24/2023 -  Chemotherapy   Patient is on Treatment Plan : BREAST CMF IV q21d       CHIEF COMPLIANT: Cycle 2 CMF  HISTORY OF PRESENT ILLNESS:   History of Present Illness Joanne Morris is a 73 year old female undergoing chemotherapy who presents for her second round of treatment.  She experienced nausea three to four days after the first round of chemotherapy, which was effectively managed with medication. She did not develop mouth sores, diarrhea, or increased fatigue. Her weight remains stable between 144 and 146 pounds, with no significant changes in appetite, although she lacks the desire to eat.  Recent blood work shows a white blood cell count of 8.6, normal hemoglobin, normal red blood cells, and normal platelets. Creatinine level is 1.14, consistent with previous results. Liver function tests were performed. Blood sugar level was 177 after eating.     ALLERGIES:  has no known allergies.  MEDICATIONS:  Current Outpatient Medications  Medication Sig Dispense Refill   acetaminophen  (TYLENOL ) 650 MG CR tablet Take 1 tablet (650 mg total) by mouth every evening. FOR PAIN. 30 tablet 11   amLODipine  (NORVASC ) 10 MG tablet TAKE  ONE TABLET BY MOUTH DAILY AT 9AM (VIAL) 90 tablet 3   aspirin  EC 81 MG tablet Take 81 mg by  mouth daily in the afternoon.     busPIRone  (BUSPAR ) 5 MG tablet TAKE ONE TABLET (5 MG TOTAL) BY MOUTH TWICE DAILY 60 tablet 5   calcium  carbonate (OSCAL) 1500 (600 Ca) MG TABS tablet Take 600 mg by mouth in the morning.     Cholecalciferol (VITAMIN D3) 125 MCG (5000 UT) TABS Take 5,000 Units by mouth daily.     cilostazol  (PLETAL ) 100 MG tablet TAKE ONE TABLET (100 MG TOTAL) BY MOUTH TWICE DAILY 180 tablet 11   diphenoxylate -atropine  (LOMOTIL ) 2.5-0.025 MG tablet Take 1 tablet by mouth 4 (four) times daily as needed for diarrhea or loose stools (Max dose of 8 tabs in 24 hours). 30 tablet 1   FARXIGA  10 MG TABS tablet TAKE ONE TABLET (10MG  TOTAL) BY MOUTH IN THE MORNING DAILY AT 9AM 90 tablet 11   glipiZIDE  (GLUCOTROL  XL) 5 MG 24 hr tablet TAKE ONE TABLET (5 MG TOTAL) BY MOUTH DAILY WITH BREAKFAST 90 tablet 11   [START ON 11/14/2023] HYDROcodone -acetaminophen  (NORCO/VICODIN) 5-325 MG tablet Take 1 tablet by mouth daily as needed for moderate pain (pain score 4-6) or severe pain (pain score 7-10). 30 tablet 0   LACTOBACILLUS PO Take 1 capsule by mouth.     levocetirizine (XYZAL ) 5 MG tablet Take 1 tablet (5 mg total) by mouth daily as needed for allergies. 90 tablet 3   lidocaine -prilocaine  (EMLA ) cream Apply to affected area once 30 g 3   LINZESS  145 MCG CAPS capsule TAKE ONE CAPSULE ( TOTAL) BY MOUTH EVERY DAY 90 capsule 11   metoprolol  tartrate (LOPRESSOR ) 100 MG tablet TAKE ONE TABLET BY MOUTH TWICE DAILY @9AM -5PM 180 tablet 1   niacin  (VITAMIN B3) 500 MG ER tablet Take 1 tablet (500 mg total) by mouth daily. 90 tablet 3   ondansetron  (ZOFRAN ) 8 MG tablet Take 1 tablet (8 mg total) by mouth every 8 (eight) hours as needed for nausea or vomiting. Start on the third day after chemotherapy. 30 tablet 1   pravastatin  (PRAVACHOL ) 40 MG tablet TAKE ONE TABLET BY MOUTH DAILY AT 5PM (VIAL) 90 tablet 3   prochlorperazine  (COMPAZINE ) 10 MG tablet Take 1 tablet (10 mg total) by mouth every 6 (six)  hours as needed for nausea or vomiting. 30 tablet 1   sertraline  (ZOLOFT ) 100 MG tablet TAKE ONE TABLET BY MOUTH DAILY AT 9AM IN ADDITION TO 50 MG TABLET (VIAL) 90 tablet 3   sertraline  (ZOLOFT ) 50 MG tablet TAKE ONE TABLET BY MOUTH DAILY AT 5PM EVERY EVENING 90 tablet 3   valsartan  (DIOVAN ) 320 MG tablet TAKE ONE TABLET BY MOUTH DAILY AT 9AM 100 tablet 11   [START ON 12/06/2023] zolpidem  (AMBIEN  CR) 12.5 MG CR tablet Take 1 tablet (12.5 mg total) by mouth at bedtime. 90 tablet 0   No current facility-administered medications for this visit.   Facility-Administered Medications Ordered in Other Visits  Medication Dose Route Frequency Provider Last Rate Last Admin   0.9 %  sodium chloride  infusion   Intravenous Continuous Kainat Pizana, MD   Stopped at 11/13/23 1208   sodium chloride  flush (NS) 0.9 % injection 10 mL  10 mL Intracatheter PRN Odean Potts, MD   10 mL at 11/13/23 1208    PHYSICAL EXAMINATION: ECOG PERFORMANCE STATUS: 1 - Symptomatic but completely ambulatory  Vitals:   11/13/23 0936 11/13/23 0937  BP: (!) 143/73  130/64  Pulse: 70 67  Resp: 16   Temp: 98.2 F (36.8 C)   SpO2: 99%    Filed Weights   11/13/23 0936  Weight: 146 lb 11.2 oz (66.5 kg)    Physical Exam MEASUREMENTS: Weight- 144-146.  (exam performed in the presence of a chaperone)  LABORATORY DATA:  I have reviewed the data as listed    Latest Ref Rng & Units 11/13/2023    8:59 AM 10/24/2023    8:40 AM 09/19/2023    2:38 PM  CMP  Glucose 70 - 99 mg/dL 822  857  91   BUN 8 - 23 mg/dL 18  20  18    Creatinine 0.44 - 1.00 mg/dL 8.85  8.86  8.85   Sodium 135 - 145 mmol/L 140  140  142   Potassium 3.5 - 5.1 mmol/L 3.9  4.1  4.0   Chloride 98 - 111 mmol/L 104  104  109   CO2 22 - 32 mmol/L 28  29  23    Calcium  8.9 - 10.3 mg/dL 89.7  89.7  89.6   Total Protein 6.5 - 8.1 g/dL 8.0  8.0    Total Bilirubin 0.0 - 1.2 mg/dL 0.4  0.4    Alkaline Phos 38 - 126 U/L 90  94    AST 15 - 41 U/L 25  21    ALT 0 - 44  U/L 23  18      Lab Results  Component Value Date   WBC 8.6 11/13/2023   HGB 12.6 11/13/2023   HCT 39.8 11/13/2023   MCV 77.9 (L) 11/13/2023   PLT 319 11/13/2023   NEUTROABS 4.4 11/13/2023    ASSESSMENT & PLAN:  Malignant neoplasm of upper-outer quadrant of left breast in female, estrogen receptor negative (HCC) 08/11/2023:Screening mammogram detected left breast mass upper outer quadrant 1.9 cm by ultrasound axilla negative biopsy: Grade 2 IDC triple negative Ki-67 70%    Treatment plan: 09/25/2023: Left lumpectomy: Grade 3 IDC 2.9 cm, margins negative, LVI not identified, ER 0%, PR 0%, HER2 negative, Ki67 70%, 0/2 lymph nodes negative Adjuvant chemotherapy with CMF x 6 cycles (patient has cardiac disease, low performance status, diabetes) Adjuvant radiation therapy ------------------------------------------------------------------------------------------------------------- Current treatment: Cycle 2 CMF Chemo toxicities: Denies any nausea vomiting Mild fatigue  Return to clinic in 3 weeks for cycle 3 ------------------------------------- Assessment and Plan Assessment & Plan Malignant neoplasm of upper-outer quadrant of left female breast Completed first chemotherapy round with minimal side effects. Blood work normal except elevated postprandial blood sugar at 177 mg/dL. - Proceed with second chemotherapy round. - Advise dietary modifications to avoid oily foods post-chemotherapy. - Schedule follow-up in three weeks.      No orders of the defined types were placed in this encounter.  The patient has a good understanding of the overall plan. she agrees with it. she will call with any problems that may develop before the next visit here. Total time spent: 30 mins including face to face time and time spent for planning, charting and co-ordination of care   Viinay K Gillis Boardley, MD 11/13/23

## 2023-11-13 NOTE — Assessment & Plan Note (Signed)
 08/11/2023:Screening mammogram detected left breast mass upper outer quadrant 1.9 cm by ultrasound axilla negative biopsy: Grade 2 IDC triple negative Ki-67 70%    Treatment plan: 09/25/2023: Left lumpectomy: Grade 3 IDC 2.9 cm, margins negative, LVI not identified, ER 0%, PR 0%, HER2 negative, Ki67 70%, 0/2 lymph nodes negative Adjuvant chemotherapy with CMF x 6 cycles (patient has cardiac disease, low performance status, diabetes) Adjuvant radiation therapy ------------------------------------------------------------------------------------------------------------- Current treatment: Cycle 2 CMF Chemo toxicities:   Return to clinic in 3 weeks for cycle 3

## 2023-11-13 NOTE — Patient Instructions (Signed)
 [facility] - A DEPT OF [facility]  Discharge Instructions: Thank you for choosing [facility] to provide your oncology and hematology care.   If you have a lab appointment with the Cancer Center, please go directly to the Cancer Center and check in at the registration area.   Wear comfortable clothing and clothing appropriate for easy access to any Portacath or PICC line.   We strive to give you quality time with your provider. You may need to reschedule your appointment if you arrive late (15 or more minutes).  Arriving late affects you and other patients whose appointments are after yours.  Also, if you miss three or more appointments without notifying the office, you may be dismissed from the clinic at the provider's discretion.      For prescription refill requests, have your pharmacy contact our office and allow 72 hours for refills to be completed.    Today you received the following chemotherapy and/or immunotherapy agents: cyclophosphamide  (CYTOXAN ), fluorouracil  (ADRUCIL ), methotrexate     To help prevent nausea and vomiting after your treatment, we encourage you to take your nausea medication as directed.  BELOW ARE SYMPTOMS THAT SHOULD BE REPORTED IMMEDIATELY: *FEVER GREATER THAN 100.4 F (38 C) OR HIGHER *CHILLS OR SWEATING *NAUSEA AND VOMITING THAT IS NOT CONTROLLED WITH YOUR NAUSEA MEDICATION *UNUSUAL SHORTNESS OF BREATH *UNUSUAL BRUISING OR BLEEDING *URINARY PROBLEMS (pain or burning when urinating, or frequent urination) *BOWEL PROBLEMS (unusual diarrhea, constipation, pain near the anus) TENDERNESS IN MOUTH AND THROAT WITH OR WITHOUT PRESENCE OF ULCERS (sore throat, sores in mouth, or a toothache) UNUSUAL RASH, SWELLING OR PAIN  UNUSUAL VAGINAL DISCHARGE OR ITCHING   Items with * indicate a potential emergency and should be followed up as soon as possible or go to the Emergency Department if any problems should  occur.  Please show the CHEMOTHERAPY ALERT CARD or IMMUNOTHERAPY ALERT CARD at check-in to the Emergency Department and triage nurse.  Should you have questions after your visit or need to cancel or reschedule your appointment, please contact [facility] - A DEPT OF [facility]  Dept: [phone]  and follow the prompts.  Office hours are 8:00 a.m. to 4:30 p.m. Monday - Friday. Please note that voicemails left after 4:00 p.m. may not be returned until the following business day.  We are closed weekends and major holidays. You have access to a nurse at all times for urgent questions. Please call the main number to the clinic Dept: [phone] and follow the prompts.   For any non-urgent questions, you may also contact your provider using MyChart. We now offer e-Visits for anyone 66 and older to request care online for non-urgent symptoms. For details visit [email or web address].   Also download the MyChart app! Go to the app store, search MyChart, open the app, select [place], and log in with your MyChart username and password.

## 2023-11-14 ENCOUNTER — Telehealth: Payer: Self-pay

## 2023-11-14 NOTE — Telephone Encounter (Signed)
 Pt called again and wants to be sure it is sent to [facility] and not [facility].

## 2023-11-14 NOTE — Telephone Encounter (Signed)
 Disregard, Mail order pharmacy changed.   Copied from CRM [id number]. Topic: Clinical - Medication Question >> Nov 14, 2023  4:10 PM [name] wrote: Reason for CRM:  [facility] received a prescription for Oxycodone , but the patient is currently also on Tylenol . Just wanted to verify if the PCP want her to be on both medications. Phone:[phone]

## 2023-11-14 NOTE — Telephone Encounter (Signed)
 Rx will be sent to [facility], confirmed with patient earlier.

## 2023-11-17 MED ORDER — HYDROCODONE-ACETAMINOPHEN 5-325 MG PO TABS: 1.0000 | ORAL_TABLET | Freq: Every day | ORAL | 0 refills | Status: DC | PRN

## 2023-11-17 MED ORDER — ZOLPIDEM TARTRATE ER 12.5 MG PO TBCR: 12.5000 mg | EXTENDED_RELEASE_TABLET | Freq: Every day | ORAL | 5 refills | Status: AC

## 2023-11-17 NOTE — Addendum Note (Signed)
 Addended by: Bowie Doiron on: 11/17/2023 09:15 PM   Modules accepted: Orders

## 2023-11-20 ENCOUNTER — Encounter: Payer: Self-pay | Admitting: Hematology and Oncology

## 2023-11-26 ENCOUNTER — Other Ambulatory Visit: Payer: Self-pay | Admitting: Family

## 2023-11-26 DIAGNOSIS — F3341 Major depressive disorder, recurrent, in partial remission: Secondary | ICD-10-CM

## 2023-11-26 DIAGNOSIS — J309 Allergic rhinitis, unspecified: Secondary | ICD-10-CM

## 2023-11-26 DIAGNOSIS — I10 Essential (primary) hypertension: Secondary | ICD-10-CM

## 2023-11-26 DIAGNOSIS — E785 Hyperlipidemia, unspecified: Secondary | ICD-10-CM

## 2023-12-03 DIAGNOSIS — M1711 Unilateral primary osteoarthritis, right knee: Secondary | ICD-10-CM | POA: Diagnosis not present

## 2023-12-04 ENCOUNTER — Inpatient Hospital Stay

## 2023-12-04 ENCOUNTER — Inpatient Hospital Stay: Admitting: Hematology and Oncology

## 2023-12-04 VITALS — BP 138/78 | HR 67 | Temp 97.3°F | Resp 14 | Ht 59.0 in | Wt 144.9 lb

## 2023-12-04 DIAGNOSIS — C50412 Malignant neoplasm of upper-outer quadrant of left female breast: Secondary | ICD-10-CM | POA: Diagnosis not present

## 2023-12-04 DIAGNOSIS — Z171 Estrogen receptor negative status [ER-]: Secondary | ICD-10-CM

## 2023-12-04 DIAGNOSIS — Z5111 Encounter for antineoplastic chemotherapy: Secondary | ICD-10-CM | POA: Diagnosis not present

## 2023-12-04 LAB — CBC WITH DIFFERENTIAL (CANCER CENTER ONLY)
Abs Immature Granulocytes: 0.27 K/uL — ABNORMAL HIGH (ref 0.00–0.07)
Basophils Absolute: 0.1 K/uL (ref 0.0–0.1)
Basophils Relative: 1 %
Eosinophils Absolute: 0.1 K/uL (ref 0.0–0.5)
Eosinophils Relative: 1 %
HCT: 39.8 % (ref 36.0–46.0)
Hemoglobin: 12.8 g/dL (ref 12.0–15.0)
Immature Granulocytes: 3 %
Lymphocytes Relative: 26 %
Lymphs Abs: 2.2 K/uL (ref 0.7–4.0)
MCH: 24.7 pg — ABNORMAL LOW (ref 26.0–34.0)
MCHC: 32.2 g/dL (ref 30.0–36.0)
MCV: 76.7 fL — ABNORMAL LOW (ref 80.0–100.0)
Monocytes Absolute: 0.7 K/uL (ref 0.1–1.0)
Monocytes Relative: 8 %
Neutro Abs: 5.2 K/uL (ref 1.7–7.7)
Neutrophils Relative %: 61 %
Platelet Count: 366 K/uL (ref 150–400)
RBC: 5.19 MIL/uL — ABNORMAL HIGH (ref 3.87–5.11)
RDW: 16.4 % — ABNORMAL HIGH (ref 11.5–15.5)
WBC Count: 8.5 K/uL (ref 4.0–10.5)
nRBC: 0 % (ref 0.0–0.2)

## 2023-12-04 LAB — CMP (CANCER CENTER ONLY)
ALT: 14 U/L (ref 0–44)
AST: 17 U/L (ref 15–41)
Albumin: 4.6 g/dL (ref 3.5–5.0)
Alkaline Phosphatase: 90 U/L (ref 38–126)
Anion gap: 8 (ref 5–15)
BUN: 18 mg/dL (ref 8–23)
CO2: 27 mmol/L (ref 22–32)
Calcium: 10.5 mg/dL — ABNORMAL HIGH (ref 8.9–10.3)
Chloride: 105 mmol/L (ref 98–111)
Creatinine: 1.09 mg/dL — ABNORMAL HIGH (ref 0.44–1.00)
GFR, Estimated: 54 mL/min — ABNORMAL LOW (ref >60)
Glucose, Bld: 158 mg/dL — ABNORMAL HIGH (ref 70–99)
Potassium: 3.5 mmol/L (ref 3.5–5.1)
Sodium: 140 mmol/L (ref 135–145)
Total Bilirubin: 0.4 mg/dL (ref 0.0–1.2)
Total Protein: 8 g/dL (ref 6.5–8.1)

## 2023-12-04 MED ORDER — METHOTREXATE SODIUM CHEMO INJECTION (PF) 50 MG/2ML
30.0000 mg/m2 | Freq: Once | INTRAMUSCULAR | Status: AC
  Administered 2023-12-04: 49.25 mg via INTRAVENOUS
  Filled 2023-12-04: qty 1.97

## 2023-12-04 MED ORDER — PALONOSETRON HCL INJECTION 0.25 MG/5ML
0.2500 mg | Freq: Once | INTRAVENOUS | Status: AC
  Administered 2023-12-04: 0.25 mg via INTRAVENOUS
  Filled 2023-12-04: qty 5

## 2023-12-04 MED ORDER — DEXAMETHASONE SOD PHOSPHATE PF 10 MG/ML IJ SOLN
10.0000 mg | Freq: Once | INTRAMUSCULAR | Status: AC
  Administered 2023-12-04: 10 mg via INTRAVENOUS

## 2023-12-04 MED ORDER — SODIUM CHLORIDE 0.9 % IV SOLN
500.0000 mg/m2 | Freq: Once | INTRAVENOUS | Status: AC
  Administered 2023-12-04: 820 mg via INTRAVENOUS
  Filled 2023-12-04: qty 41

## 2023-12-04 MED ORDER — SODIUM CHLORIDE 0.9 % IV SOLN: INTRAVENOUS | Status: DC

## 2023-12-04 MED ORDER — FLUOROURACIL CHEMO INJECTION 2.5 GM/50ML
400.0000 mg/m2 | Freq: Once | INTRAVENOUS | Status: AC
  Administered 2023-12-04: 650 mg via INTRAVENOUS
  Filled 2023-12-04: qty 13

## 2023-12-04 NOTE — Patient Instructions (Signed)
 [facility] - A DEPT OF [facility]  Discharge Instructions: Thank you for choosing [facility] to provide your oncology and hematology care.   If you have a lab appointment with the Cancer Center, please go directly to the Cancer Center and check in at the registration area.   Wear comfortable clothing and clothing appropriate for easy access to any Portacath or PICC line.   We strive to give you quality time with your provider. You may need to reschedule your appointment if you arrive late (15 or more minutes).  Arriving late affects you and other patients whose appointments are after yours.  Also, if you miss three or more appointments without notifying the office, you may be dismissed from the clinic at the provider's discretion.      For prescription refill requests, have your pharmacy contact our office and allow 72 hours for refills to be completed.    Today you received the following chemotherapy and/or immunotherapy agents: cyclophosphamide  (CYTOXAN ), fluorouracil  (ADRUCIL ), methotrexate     To help prevent nausea and vomiting after your treatment, we encourage you to take your nausea medication as directed.  BELOW ARE SYMPTOMS THAT SHOULD BE REPORTED IMMEDIATELY: *FEVER GREATER THAN 100.4 F (38 C) OR HIGHER *CHILLS OR SWEATING *NAUSEA AND VOMITING THAT IS NOT CONTROLLED WITH YOUR NAUSEA MEDICATION *UNUSUAL SHORTNESS OF BREATH *UNUSUAL BRUISING OR BLEEDING *URINARY PROBLEMS (pain or burning when urinating, or frequent urination) *BOWEL PROBLEMS (unusual diarrhea, constipation, pain near the anus) TENDERNESS IN MOUTH AND THROAT WITH OR WITHOUT PRESENCE OF ULCERS (sore throat, sores in mouth, or a toothache) UNUSUAL RASH, SWELLING OR PAIN  UNUSUAL VAGINAL DISCHARGE OR ITCHING   Items with * indicate a potential emergency and should be followed up as soon as possible or go to the Emergency Department if any problems should  occur.  Please show the CHEMOTHERAPY ALERT CARD or IMMUNOTHERAPY ALERT CARD at check-in to the Emergency Department and triage nurse.  Should you have questions after your visit or need to cancel or reschedule your appointment, please contact [facility] - A DEPT OF [facility]  Dept: [phone]  and follow the prompts.  Office hours are 8:00 a.m. to 4:30 p.m. Monday - Friday. Please note that voicemails left after 4:00 p.m. may not be returned until the following business day.  We are closed weekends and major holidays. You have access to a nurse at all times for urgent questions. Please call the main number to the clinic Dept: [phone] and follow the prompts.   For any non-urgent questions, you may also contact your provider using MyChart. We now offer e-Visits for anyone 66 and older to request care online for non-urgent symptoms. For details visit [email or web address].   Also download the MyChart app! Go to the app store, search MyChart, open the app, select [place], and log in with your MyChart username and password.

## 2023-12-04 NOTE — Progress Notes (Signed)
 Patient Care Team: Lucius Krabbe, NP as PCP - General (Family Medicine) Tye Leach, NP as Nurse Practitioner [name], [name] [name], MD as Consulting Physician (Vascular Surgery) [name], MD as Attending Physician (Family Medicine) [name], RN as Oncology Nurse Navigator Aron Shoulders, MD as Consulting Physician (General Surgery) Odean Potts, MD as Consulting Physician (Hematology and Oncology) Shannon Agent, MD as Consulting Physician (Radiation Oncology)  DIAGNOSIS:  Encounter Diagnosis  Name Primary?   Malignant neoplasm of upper-outer quadrant of left breast in female, estrogen receptor negative (HCC) Yes    SUMMARY OF ONCOLOGIC HISTORY: Oncology History  Malignant neoplasm of upper-outer quadrant of left breast in female, estrogen receptor negative (HCC)  08/11/2023 Initial Diagnosis   Screening mammogram detected left breast mass upper outer quadrant 1.9 cm by ultrasound axilla negative biopsy: Grade 2 IDC triple negative Ki-67 70%   08/13/2023 Cancer Staging   Staging form: Breast, AJCC 8th Edition - Clinical stage from 08/13/2023: Stage IB (cT1c, cN0, cM0, G2, ER-, PR-, HER2-) - Signed by Odean Potts, MD on 08/13/2023 Stage prefix: Initial diagnosis Histologic grading system: 3 grade system Laterality: Left Staged by: Pathologist and managing physician Stage used in treatment planning: Yes National guidelines used in treatment planning: Yes Type of national guideline used in treatment planning: NCCN    Genetic Testing   [facility] CancerNext-Expanded Panel+RNA was Negative. Report date is 08/23/2023.   The CancerNext-Expanded gene panel offered by [facility] and includes sequencing, rearrangement, and RNA analysis for the following 77 genes: AIP, ALK, APC, ATM, AXIN2, BAP1, BARD1, BMPR1A, BRCA1, BRCA2, BRIP1, CDC73, CDH1, CDK4, CDKN1B, CDKN2A, CEBPA, CHEK2, CTNNA1, DDX41, DICER1, ETV6, FH, FLCN, GATA2, LZTR1, MAX, MBD4, MEN1, MET, MLH1, MSH2,  MSH3, MSH6, MUTYH, NF1, NF2, NTHL1, PALB2, PHOX2B, PMS2, POT1, PRKAR1A, PTCH1, PTEN, RAD51C, RAD51D, RB1, RET, RPS20, RUNX1, SDHA, SDHAF2, SDHB, SDHC, SDHD, SMAD4, SMARCA4, SMARCB1, SMARCE1, STK11, SUFU, TMEM127, TP53, TSC1, TSC2, VHL, and WT1 (sequencing and deletion/duplication); EGFR, HOXB13, KIT, MITF, PDGFRA, POLD1, and POLE (sequencing only); EPCAM and GREM1 (deletion/duplication only).     09/25/2023 Cancer Staging   Staging form: Breast, AJCC 8th Edition - Pathologic stage from 09/25/2023: Stage IIA (pT2, pN0, cM0, G3, ER-, PR-, HER2-) - Signed by Crawford Morna Pickle, NP on 10/24/2023 Stage prefix: Initial diagnosis Histologic grading system: 3 grade system   10/24/2023 -  Chemotherapy   Patient is on Treatment Plan : BREAST CMF IV q21d       CHIEF COMPLIANT: Cycle 3 CMF  HISTORY OF PRESENT ILLNESS: History of Present Illness Joanne Morris is a 73 year old female undergoing chemotherapy for estrogen receptor-negative breast cancer who presents for a follow-up visit.  She is currently receiving chemotherapy for estrogen receptor-negative breast cancer in the upper-outer quadrant of the left breast. She is halfway through her treatment, having completed three sessions. She experiences no significant side effects such as nausea, mouth sores, or increased fatigue. Her appetite is good, and bowel movements are regular.  Her blood work shows normal white count, hemoglobin, and platelet levels.     ALLERGIES:  has no known allergies.  MEDICATIONS:  Current Outpatient Medications  Medication Sig Dispense Refill   acetaminophen  (TYLENOL ) 650 MG CR tablet Take 1 tablet (650 mg total) by mouth every evening. FOR PAIN. 30 tablet 11   amLODipine  (NORVASC ) 10 MG tablet TAKE ONE TABLET BY MOUTH DAILY AT 9AM 90 tablet 11   aspirin  EC 81 MG tablet Take 81 mg by mouth daily in the afternoon.  busPIRone  (BUSPAR ) 5 MG tablet TAKE ONE TABLET (5 MG TOTAL) BY MOUTH TWICE DAILY 60 tablet 5    calcium  carbonate (OSCAL) 1500 (600 Ca) MG TABS tablet Take 600 mg by mouth in the morning.     Cholecalciferol (VITAMIN D3) 125 MCG (5000 UT) TABS Take 5,000 Units by mouth daily.     cilostazol  (PLETAL ) 100 MG tablet TAKE ONE TABLET (100 MG TOTAL) BY MOUTH TWICE DAILY 180 tablet 11   diphenoxylate -atropine  (LOMOTIL ) 2.5-0.025 MG tablet Take 1 tablet by mouth 4 (four) times daily as needed for diarrhea or loose stools (Max dose of 8 tabs in 24 hours). 30 tablet 1   FARXIGA  10 MG TABS tablet TAKE ONE TABLET (10MG  TOTAL) BY MOUTH IN THE MORNING DAILY AT 9AM 90 tablet 11   glipiZIDE  (GLUCOTROL  XL) 5 MG 24 hr tablet TAKE ONE TABLET (5 MG TOTAL) BY MOUTH DAILY WITH BREAKFAST 90 tablet 11   HYDROcodone -acetaminophen  (NORCO/VICODIN) 5-325 MG tablet Take 1 tablet by mouth daily as needed for moderate pain (pain score 4-6) or severe pain (pain score 7-10). 30 tablet 0   LACTOBACILLUS PO Take 1 capsule by mouth.     levocetirizine (XYZAL ) 5 MG tablet TAKE ONE TABLET (5 MG TOTAL) BY MOUTH DAILY AS NEEDED FOR ALLERGIES 90 tablet 11   lidocaine -prilocaine  (EMLA ) cream Apply to affected area once 30 g 3   LINZESS  145 MCG CAPS capsule TAKE ONE CAPSULE (145MCG TOTAL) BY MOUTH EVERY DAY 90 capsule 11   metoprolol  tartrate (LOPRESSOR ) 100 MG tablet TAKE ONE TABLET BY MOUTH TWICE DAILY @9AM -5PM 180 tablet 1   niacin  (VITAMIN B3) 500 MG ER tablet TAKE ONE TABLET (500 MG TOTAL) BY MOUTH DAILY 90 tablet 11   ondansetron  (ZOFRAN ) 8 MG tablet Take 1 tablet (8 mg total) by mouth every 8 (eight) hours as needed for nausea or vomiting. Start on the third day after chemotherapy. 30 tablet 1   pravastatin  (PRAVACHOL ) 40 MG tablet TAKE ONE TABLET BY MOUTH DAILY AT 5PM 90 tablet 11   prochlorperazine  (COMPAZINE ) 10 MG tablet Take 1 tablet (10 mg total) by mouth every 6 (six) hours as needed for nausea or vomiting. 30 tablet 1   sertraline  (ZOLOFT ) 100 MG tablet TAKE ONE TABLET BY MOUTH DAILY AT 9AM IN ADDITION TO 50 MG TABLET 90  tablet 11   sertraline  (ZOLOFT ) 50 MG tablet TAKE ONE TABLET BY MOUTH DAILY AT 5PM EVERY EVENING 90 tablet 3   valsartan  (DIOVAN ) 320 MG tablet TAKE ONE TABLET BY MOUTH DAILY AT 9AM 100 tablet 11   [START ON 12/06/2023] zolpidem  (AMBIEN  CR) 12.5 MG CR tablet Take 1 tablet (12.5 mg total) by mouth at bedtime. 30 tablet 5   No current facility-administered medications for this visit.    PHYSICAL EXAMINATION: ECOG PERFORMANCE STATUS: 1 - Symptomatic but completely ambulatory  Vitals:   12/04/23 0904  BP: 138/78  Pulse: 67  Resp: 14  Temp: (!) 97.3 F (36.3 C)  SpO2: 100%   Filed Weights   12/04/23 0904  Weight: 144 lb 14.4 oz (65.7 kg)     LABORATORY DATA:  I have reviewed the data as listed    Latest Ref Rng & Units 11/13/2023    8:59 AM 10/24/2023    8:40 AM 09/19/2023    2:38 PM  CMP  Glucose 70 - 99 mg/dL 822  857  91   BUN 8 - 23 mg/dL 18  20  18    Creatinine 0.44 - 1.00 mg/dL  1.14  1.13  1.14   Sodium 135 - 145 mmol/L 140  140  142   Potassium 3.5 - 5.1 mmol/L 3.9  4.1  4.0   Chloride 98 - 111 mmol/L 104  104  109   CO2 22 - 32 mmol/L 28  29  23    Calcium  8.9 - 10.3 mg/dL 89.7  89.7  89.6   Total Protein 6.5 - 8.1 g/dL 8.0  8.0    Total Bilirubin 0.0 - 1.2 mg/dL 0.4  0.4    Alkaline Phos 38 - 126 U/L 90  94    AST 15 - 41 U/L 25  21    ALT 0 - 44 U/L 23  18      Lab Results  Component Value Date   WBC 8.5 12/04/2023   HGB 12.8 12/04/2023   HCT 39.8 12/04/2023   MCV 76.7 (L) 12/04/2023   PLT 366 12/04/2023   NEUTROABS 5.2 12/04/2023    ASSESSMENT & PLAN:  Malignant neoplasm of upper-outer quadrant of left breast in female, estrogen receptor negative (HCC) 08/11/2023:Screening mammogram detected left breast mass upper outer quadrant 1.9 cm by ultrasound axilla negative biopsy: Grade 2 IDC triple negative Ki-67 70%    Treatment plan: 09/25/2023: Left lumpectomy: Grade 3 IDC 2.9 cm, margins negative, LVI not identified, ER 0%, PR 0%, HER2 negative, Ki67 70%,  0/2 lymph nodes negative Adjuvant chemotherapy with CMF x 6 cycles (patient has cardiac disease, low performance status, diabetes) Adjuvant radiation therapy ------------------------------------------------------------------------------------------------------------- Current treatment: Cycle 3 CMF Chemo toxicities: Denies any nausea vomiting Mild fatigue   She expressed to me that she is very concerned about radiation toxicities.  I counseled her that she should discuss with radiation oncology once chemotherapy is completed before making any final decision.  Return to clinic in 3 weeks for cycle 4 ------------------------------------- Assessment and Plan Assessment & Plan Malignant neoplasm of upper-outer quadrant of left breast Undergoing chemotherapy for estrogen receptor-negative breast cancer. At halfway point with no significant side effects. Blood work normal. - Continue chemotherapy as scheduled. - Schedule next chemotherapy session for November 13th at 9:15 AM. - Discuss radiation therapy options with radiation oncologist post-chemotherapy. - Arrange port removal after sixth chemotherapy session. - Emphasized importance of individualized treatment plans.      No orders of the defined types were placed in this encounter.  The patient has a good understanding of the overall plan. she agrees with it. she will call with any problems that may develop before the next visit here.  I personally spent a total of 30 minutes in the care of the patient today including preparing to see the patient, getting/reviewing separately obtained history, performing a medically appropriate exam/evaluation, counseling and educating, placing orders, referring and communicating with other health care professionals, documenting clinical information in the EHR, independently interpreting results, communicating results, and coordinating care.   Viinay K Rayelle Armor, MD 12/04/23

## 2023-12-04 NOTE — Assessment & Plan Note (Signed)
 08/11/2023:Screening mammogram detected left breast mass upper outer quadrant 1.9 cm by ultrasound axilla negative biopsy: Grade 2 IDC triple negative Ki-67 70%    Treatment plan: 09/25/2023: Left lumpectomy: Grade 3 IDC 2.9 cm, margins negative, LVI not identified, ER 0%, PR 0%, HER2 negative, Ki67 70%, 0/2 lymph nodes negative Adjuvant chemotherapy with CMF x 6 cycles (patient has cardiac disease, low performance status, diabetes) Adjuvant radiation therapy ------------------------------------------------------------------------------------------------------------- Current treatment: Cycle 3 CMF Chemo toxicities: Denies any nausea vomiting Mild fatigue   Return to clinic in 3 weeks for cycle 4

## 2023-12-10 ENCOUNTER — Other Ambulatory Visit: Payer: Self-pay | Admitting: Family

## 2023-12-10 DIAGNOSIS — F411 Generalized anxiety disorder: Secondary | ICD-10-CM

## 2023-12-15 ENCOUNTER — Encounter: Payer: Self-pay | Admitting: Hematology and Oncology

## 2023-12-25 ENCOUNTER — Encounter: Payer: Self-pay | Admitting: *Deleted

## 2023-12-25 ENCOUNTER — Inpatient Hospital Stay: Attending: Hematology and Oncology

## 2023-12-25 ENCOUNTER — Inpatient Hospital Stay: Admitting: Hematology and Oncology

## 2023-12-25 ENCOUNTER — Inpatient Hospital Stay

## 2023-12-25 VITALS — BP 133/72 | HR 63 | Temp 98.5°F | Resp 16 | Wt 144.5 lb

## 2023-12-25 DIAGNOSIS — Z171 Estrogen receptor negative status [ER-]: Secondary | ICD-10-CM | POA: Diagnosis not present

## 2023-12-25 DIAGNOSIS — Z5111 Encounter for antineoplastic chemotherapy: Secondary | ICD-10-CM | POA: Insufficient documentation

## 2023-12-25 DIAGNOSIS — C50412 Malignant neoplasm of upper-outer quadrant of left female breast: Secondary | ICD-10-CM

## 2023-12-25 DIAGNOSIS — Z79631 Long term (current) use of antimetabolite agent: Secondary | ICD-10-CM | POA: Insufficient documentation

## 2023-12-25 LAB — CBC WITH DIFFERENTIAL (CANCER CENTER ONLY)
Abs Immature Granulocytes: 0.18 K/uL — ABNORMAL HIGH (ref 0.00–0.07)
Basophils Absolute: 0.1 K/uL (ref 0.0–0.1)
Basophils Relative: 1 %
Eosinophils Absolute: 0.1 K/uL (ref 0.0–0.5)
Eosinophils Relative: 1 %
HCT: 39.7 % (ref 36.0–46.0)
Hemoglobin: 12.6 g/dL (ref 12.0–15.0)
Immature Granulocytes: 2 %
Lymphocytes Relative: 32 %
Lymphs Abs: 2.8 K/uL (ref 0.7–4.0)
MCH: 24.9 pg — ABNORMAL LOW (ref 26.0–34.0)
MCHC: 31.7 g/dL (ref 30.0–36.0)
MCV: 78.5 fL — ABNORMAL LOW (ref 80.0–100.0)
Monocytes Absolute: 0.7 K/uL (ref 0.1–1.0)
Monocytes Relative: 7 %
Neutro Abs: 5 K/uL (ref 1.7–7.7)
Neutrophils Relative %: 57 %
Platelet Count: 268 K/uL (ref 150–400)
RBC: 5.06 MIL/uL (ref 3.87–5.11)
RDW: 16.7 % — ABNORMAL HIGH (ref 11.5–15.5)
WBC Count: 8.8 K/uL (ref 4.0–10.5)
nRBC: 0 % (ref 0.0–0.2)

## 2023-12-25 LAB — CMP (CANCER CENTER ONLY)
ALT: 13 U/L (ref 0–44)
AST: 15 U/L (ref 15–41)
Albumin: 4.1 g/dL (ref 3.5–5.0)
Alkaline Phosphatase: 80 U/L (ref 38–126)
Anion gap: 7 (ref 5–15)
BUN: 14 mg/dL (ref 8–23)
CO2: 28 mmol/L (ref 22–32)
Calcium: 10.1 mg/dL (ref 8.9–10.3)
Chloride: 105 mmol/L (ref 98–111)
Creatinine: 1.17 mg/dL — ABNORMAL HIGH (ref 0.44–1.00)
GFR, Estimated: 49 mL/min — ABNORMAL LOW (ref >60)
Glucose, Bld: 125 mg/dL — ABNORMAL HIGH (ref 70–99)
Potassium: 3.8 mmol/L (ref 3.5–5.1)
Sodium: 140 mmol/L (ref 135–145)
Total Bilirubin: 0.3 mg/dL (ref 0.0–1.2)
Total Protein: 7.2 g/dL (ref 6.5–8.1)

## 2023-12-25 MED ORDER — DEXAMETHASONE SOD PHOSPHATE PF 10 MG/ML IJ SOLN
10.0000 mg | Freq: Once | INTRAMUSCULAR | Status: AC
  Administered 2023-12-25: 10 mg via INTRAVENOUS

## 2023-12-25 MED ORDER — SODIUM CHLORIDE 0.9 % IV SOLN
500.0000 mg/m2 | Freq: Once | INTRAVENOUS | Status: AC
  Administered 2023-12-25: 820 mg via INTRAVENOUS
  Filled 2023-12-25: qty 41

## 2023-12-25 MED ORDER — PALONOSETRON HCL INJECTION 0.25 MG/5ML
0.2500 mg | Freq: Once | INTRAVENOUS | Status: AC
  Administered 2023-12-25: 0.25 mg via INTRAVENOUS
  Filled 2023-12-25: qty 5

## 2023-12-25 MED ORDER — FLUOROURACIL CHEMO INJECTION 2.5 GM/50ML
400.0000 mg/m2 | Freq: Once | INTRAVENOUS | Status: AC
  Administered 2023-12-25: 650 mg via INTRAVENOUS
  Filled 2023-12-25: qty 13

## 2023-12-25 MED ORDER — METHOTREXATE SODIUM CHEMO INJECTION (PF) 50 MG/2ML
30.0000 mg/m2 | Freq: Once | INTRAMUSCULAR | Status: AC
  Administered 2023-12-25: 49.25 mg via INTRAVENOUS
  Filled 2023-12-25: qty 1.97

## 2023-12-25 MED ORDER — SODIUM CHLORIDE 0.9% FLUSH: 10.0000 mL | INTRAVENOUS | Status: DC | PRN

## 2023-12-25 MED ORDER — SODIUM CHLORIDE 0.9 % IV SOLN: INTRAVENOUS | Status: DC

## 2023-12-25 NOTE — Assessment & Plan Note (Signed)
 08/11/2023:Screening mammogram detected left breast mass upper outer quadrant 1.9 cm by ultrasound axilla negative biopsy: Grade 2 IDC triple negative Ki-67 70%    Treatment plan: 09/25/2023: Left lumpectomy: Grade 3 IDC 2.9 cm, margins negative, LVI not identified, ER 0%, PR 0%, HER2 negative, Ki67 70%, 0/2 lymph nodes negative Adjuvant chemotherapy with CMF x 6 cycles (patient has cardiac disease, low performance status, diabetes) Adjuvant radiation therapy ------------------------------------------------------------------------------------------------------------- Current treatment: Cycle 4 CMF Chemo toxicities: Denies any nausea vomiting Mild fatigue   She expressed to me that she is very concerned about radiation toxicities.  I counseled her that she should discuss with radiation oncology once chemotherapy is completed before making any final decision.   Return to clinic in 3 weeks for cycle 5

## 2023-12-25 NOTE — Progress Notes (Signed)
 Patient Care Team: Lucius Krabbe, NP as PCP - General (Family Medicine) Tye Leach, NP as Nurse Practitioner [name], [name] [name], MD as Consulting Physician (Vascular Surgery) [name], MD as Attending Physician (Family Medicine) [name], RN as Oncology Nurse Navigator Aron Shoulders, MD as Consulting Physician (General Surgery) Odean Potts, MD as Consulting Physician (Hematology and Oncology) Shannon Agent, MD as Consulting Physician (Radiation Oncology)  DIAGNOSIS:  Encounter Diagnosis  Name Primary?   Malignant neoplasm of upper-outer quadrant of left breast in female, estrogen receptor negative (HCC) Yes    SUMMARY OF ONCOLOGIC HISTORY: Oncology History  Malignant neoplasm of upper-outer quadrant of left breast in female, estrogen receptor negative (HCC)  08/11/2023 Initial Diagnosis   Screening mammogram detected left breast mass upper outer quadrant 1.9 cm by ultrasound axilla negative biopsy: Grade 2 IDC triple negative Ki-67 70%   08/13/2023 Cancer Staging   Staging form: Breast, AJCC 8th Edition - Clinical stage from 08/13/2023: Stage IB (cT1c, cN0, cM0, G2, ER-, PR-, HER2-) - Signed by Odean Potts, MD on 08/13/2023 Stage prefix: Initial diagnosis Histologic grading system: 3 grade system Laterality: Left Staged by: Pathologist and managing physician Stage used in treatment planning: Yes National guidelines used in treatment planning: Yes Type of national guideline used in treatment planning: NCCN    Genetic Testing   [facility] CancerNext-Expanded Panel+RNA was Negative. Report date is 08/23/2023.   The CancerNext-Expanded gene panel offered by [facility] and includes sequencing, rearrangement, and RNA analysis for the following 77 genes: AIP, ALK, APC, ATM, AXIN2, BAP1, BARD1, BMPR1A, BRCA1, BRCA2, BRIP1, CDC73, CDH1, CDK4, CDKN1B, CDKN2A, CEBPA, CHEK2, CTNNA1, DDX41, DICER1, ETV6, FH, FLCN, GATA2, LZTR1, MAX, MBD4, MEN1, MET, MLH1, MSH2,  MSH3, MSH6, MUTYH, NF1, NF2, NTHL1, PALB2, PHOX2B, PMS2, POT1, PRKAR1A, PTCH1, PTEN, RAD51C, RAD51D, RB1, RET, RPS20, RUNX1, SDHA, SDHAF2, SDHB, SDHC, SDHD, SMAD4, SMARCA4, SMARCB1, SMARCE1, STK11, SUFU, TMEM127, TP53, TSC1, TSC2, VHL, and WT1 (sequencing and deletion/duplication); EGFR, HOXB13, KIT, MITF, PDGFRA, POLD1, and POLE (sequencing only); EPCAM and GREM1 (deletion/duplication only).     09/25/2023 Cancer Staging   Staging form: Breast, AJCC 8th Edition - Pathologic stage from 09/25/2023: Stage IIA (pT2, pN0, cM0, G3, ER-, PR-, HER2-) - Signed by Crawford Morna Pickle, NP on 10/24/2023 Stage prefix: Initial diagnosis Histologic grading system: 3 grade system   10/24/2023 -  Chemotherapy   Patient is on Treatment Plan : BREAST CMF IV q21d       CHIEF COMPLIANT: Cycle 4 CMF  HISTORY OF PRESENT ILLNESS:  History of Present Illness Jaselle Pryer is a 73 year old female undergoing chemotherapy who presents for follow-up of her treatment with CMF cycle 4.  She is undergoing chemotherapy for breast cancer with no significant nausea or stomach upset. She experienced one day of diarrhea, possibly related to diet or chemotherapy. Her energy levels are decent, and she is not experiencing significant hair loss. Recent blood work shows normal white blood cell count, hemoglobin, and platelet levels.     ALLERGIES:  has no known allergies.  MEDICATIONS:  Current Outpatient Medications  Medication Sig Dispense Refill   acetaminophen  (TYLENOL ) 650 MG CR tablet Take 1 tablet (650 mg total) by mouth every evening. FOR PAIN. 30 tablet 11   amLODipine  (NORVASC ) 10 MG tablet TAKE ONE TABLET BY MOUTH DAILY AT 9AM 90 tablet 11   aspirin  EC 81 MG tablet Take 81 mg by mouth daily in the afternoon.     busPIRone  (BUSPAR ) 5 MG tablet TAKE ONE TABLET (  5MG  TOTAL) BY MOUTH TWICE DAILY 180 tablet 4   calcium  carbonate (OSCAL) 1500 (600 Ca) MG TABS tablet Take 600 mg by mouth in the morning.      Cholecalciferol (VITAMIN D3) 125 MCG (5000 UT) TABS Take 5,000 Units by mouth daily.     cilostazol  (PLETAL ) 100 MG tablet TAKE ONE TABLET (100 MG TOTAL) BY MOUTH TWICE DAILY 180 tablet 11   diphenoxylate -atropine  (LOMOTIL ) 2.5-0.025 MG tablet Take 1 tablet by mouth 4 (four) times daily as needed for diarrhea or loose stools (Max dose of 8 tabs in 24 hours). 30 tablet 1   FARXIGA  10 MG TABS tablet TAKE ONE TABLET (10MG  TOTAL) BY MOUTH IN THE MORNING DAILY AT 9AM 90 tablet 11   glipiZIDE  (GLUCOTROL  XL) 5 MG 24 hr tablet TAKE ONE TABLET (5 MG TOTAL) BY MOUTH DAILY WITH BREAKFAST 90 tablet 11   HYDROcodone -acetaminophen  (NORCO/VICODIN) 5-325 MG tablet Take 1 tablet by mouth daily as needed for moderate pain (pain score 4-6) or severe pain (pain score 7-10). 30 tablet 0   LACTOBACILLUS PO Take 1 capsule by mouth.     levocetirizine (XYZAL ) 5 MG tablet TAKE ONE TABLET (5 MG TOTAL) BY MOUTH DAILY AS NEEDED FOR ALLERGIES 90 tablet 11   lidocaine -prilocaine  (EMLA ) cream Apply to affected area once 30 g 3   LINZESS  145 MCG CAPS capsule TAKE ONE CAPSULE (145MCG TOTAL) BY MOUTH EVERY DAY 90 capsule 11   metoprolol  tartrate (LOPRESSOR ) 100 MG tablet TAKE ONE TABLET BY MOUTH TWICE DAILY @9AM -5PM 180 tablet 1   niacin  (VITAMIN B3) 500 MG ER tablet TAKE ONE TABLET (500 MG TOTAL) BY MOUTH DAILY 90 tablet 11   ondansetron  (ZOFRAN ) 8 MG tablet Take 1 tablet (8 mg total) by mouth every 8 (eight) hours as needed for nausea or vomiting. Start on the third day after chemotherapy. 30 tablet 1   pravastatin  (PRAVACHOL ) 40 MG tablet TAKE ONE TABLET BY MOUTH DAILY AT 5PM 90 tablet 11   prochlorperazine  (COMPAZINE ) 10 MG tablet Take 1 tablet (10 mg total) by mouth every 6 (six) hours as needed for nausea or vomiting. 30 tablet 1   sertraline  (ZOLOFT ) 100 MG tablet TAKE ONE TABLET BY MOUTH DAILY AT 9AM IN ADDITION TO 50 MG TABLET 90 tablet 11   sertraline  (ZOLOFT ) 50 MG tablet TAKE ONE TABLET BY MOUTH DAILY AT 5PM EVERY EVENING  90 tablet 3   valsartan  (DIOVAN ) 320 MG tablet TAKE ONE TABLET BY MOUTH DAILY AT 9AM 100 tablet 11   zolpidem  (AMBIEN  CR) 12.5 MG CR tablet Take 1 tablet (12.5 mg total) by mouth at bedtime. 30 tablet 5   No current facility-administered medications for this visit.    PHYSICAL EXAMINATION: ECOG PERFORMANCE STATUS: 1 - Symptomatic but completely ambulatory  There were no vitals filed for this visit. There were no vitals filed for this visit.  Physical Exam   (exam performed in the presence of a chaperone)  LABORATORY DATA:  I have reviewed the data as listed    Latest Ref Rng & Units 12/04/2023    8:41 AM 11/13/2023    8:59 AM 10/24/2023    8:40 AM  CMP  Glucose 70 - 99 mg/dL 841  822  857   BUN 8 - 23 mg/dL 18  18  20    Creatinine 0.44 - 1.00 mg/dL 8.90  8.85  8.86   Sodium 135 - 145 mmol/L 140  140  140   Potassium 3.5 - 5.1 mmol/L 3.5  3.9  4.1   Chloride 98 - 111 mmol/L 105  104  104   CO2 22 - 32 mmol/L 27  28  29    Calcium  8.9 - 10.3 mg/dL 89.4  89.7  89.7   Total Protein 6.5 - 8.1 g/dL 8.0  8.0  8.0   Total Bilirubin 0.0 - 1.2 mg/dL 0.4  0.4  0.4   Alkaline Phos 38 - 126 U/L 90  90  94   AST 15 - 41 U/L 17  25  21    ALT 0 - 44 U/L 14  23  18      Lab Results  Component Value Date   WBC 8.8 12/25/2023   HGB 12.6 12/25/2023   HCT 39.7 12/25/2023   MCV 78.5 (L) 12/25/2023   PLT 268 12/25/2023   NEUTROABS 5.0 12/25/2023    ASSESSMENT & PLAN:  Malignant neoplasm of upper-outer quadrant of left breast in female, estrogen receptor negative (HCC) 08/11/2023:Screening mammogram detected left breast mass upper outer quadrant 1.9 cm by ultrasound axilla negative biopsy: Grade 2 IDC triple negative Ki-67 70%    Treatment plan: 09/25/2023: Left lumpectomy: Grade 3 IDC 2.9 cm, margins negative, LVI not identified, ER 0%, PR 0%, HER2 negative, Ki67 70%, 0/2 lymph nodes negative Adjuvant chemotherapy with CMF x 6 cycles (patient has cardiac disease, low performance status,  diabetes) Adjuvant radiation therapy ------------------------------------------------------------------------------------------------------------- Current treatment: Cycle 4 CMF Chemo toxicities: Denies any nausea vomiting Mild fatigue Denies any nausea or vomiting.  She had 1 episode of diarrhea.  She expressed to me that she is very concerned about radiation toxicities.  I counseled her that she should discuss with radiation oncology once chemotherapy is completed before making any final decision.   Return to clinic in 3 weeks for cycle 5     No orders of the defined types were placed in this encounter.  The patient has a good understanding of the overall plan. she agrees with it. she will call with any problems that may develop before the next visit here.  I personally spent a total of 30 minutes in the care of the patient today including preparing to see the patient, getting/reviewing separately obtained history, performing a medically appropriate exam/evaluation, counseling and educating, placing orders, referring and communicating with other health care professionals, documenting clinical information in the EHR, independently interpreting results, communicating results, and coordinating care.   Viinay K Arnelle Nale, MD 12/25/23

## 2024-01-07 NOTE — Progress Notes (Signed)
 Donalda Job                                          MRN: [id number]   01/07/2024   The [facility] Quality Team Specialist reviewed this patient medical record for the purposes of chart review for care gap closure. The following were reviewed: abstraction for care gap closure-glycemic status assessment. KED closed in [facility] portal.    [facility] Quality Team

## 2024-01-13 ENCOUNTER — Telehealth: Payer: Self-pay | Admitting: Family

## 2024-01-13 DIAGNOSIS — G894 Chronic pain syndrome: Secondary | ICD-10-CM

## 2024-01-13 NOTE — Telephone Encounter (Unsigned)
 Copied from CRM #[id number]. Topic: Clinical - Medication Refill >> Jan 13, 2024  1:57 PM Thersia C wrote: Medication: HYDROcodone -acetaminophen  (NORCO/VICODIN) 5-325 MG tablet  Has the patient contacted their pharmacy? Yes (Agent: If no, request that the patient contact the pharmacy for the refill. If patient does not wish to contact the pharmacy document the reason why and proceed with request.) (Agent: If yes, when and what did the pharmacy advise?)  This is the patient's preferred pharmacy:    [facility] - [place] - [address] [place] [address] Phone: [phone] Fax: [phone]   Is this the correct pharmacy for this prescription? Yes If no, delete pharmacy and type the correct one.   Has the prescription been filled recently? No  Is the patient out of the medication? Yes  Has the patient been seen for an appointment in the last year OR does the patient have an upcoming appointment? Yes  Can we respond through MyChart? Yes  Agent: Please be advised that Rx refills may take up to 3 business days. We ask that you follow-up with your pharmacy.

## 2024-01-15 NOTE — Progress Notes (Signed)
 [facility] [phone]   [phone]  PROGRESS NOTE  Patient Care Team: Lucius Krabbe, NP as PCP - General (Family Medicine) Tye Leach, NP as Nurse Practitioner [name], [name] [name], MD as Consulting Physician (Vascular Surgery) [name], MD as Attending Physician (Family Medicine) [name], RN as Oncology Nurse Navigator Aron Shoulders, MD as Consulting Physician (General Surgery) Odean Potts, MD as Consulting Physician (Hematology and Oncology) Shannon Agent, MD as Consulting Physician (Radiation Oncology)  CHIEF COMPLAINTS/PURPOSE OF CONSULTATION:  Left breast cancer  Oncology History  Malignant neoplasm of upper-outer quadrant of left breast in female, estrogen receptor negative (HCC)  08/11/2023 Initial Diagnosis   Screening mammogram detected left breast mass upper outer quadrant 1.9 cm by ultrasound axilla negative biopsy: Grade 2 IDC triple negative Ki-67 70%   08/13/2023 Cancer Staging   Staging form: Breast, AJCC 8th Edition - Clinical stage from 08/13/2023: Stage IB (cT1c, cN0, cM0, G2, ER-, PR-, HER2-) - Signed by Odean Potts, MD on 08/13/2023 Stage prefix: Initial diagnosis Histologic grading system: 3 grade system Laterality: Left Staged by: Pathologist and managing physician Stage used in treatment planning: Yes National guidelines used in treatment planning: Yes Type of national guideline used in treatment planning: NCCN    Genetic Testing   [facility] CancerNext-Expanded Panel+RNA was Negative. Report date is 08/23/2023.   The CancerNext-Expanded gene panel offered by [facility] and includes sequencing, rearrangement, and RNA analysis for the following 77 genes: AIP, ALK, APC, ATM, AXIN2, BAP1, BARD1, BMPR1A, BRCA1, BRCA2, BRIP1, CDC73, CDH1, CDK4, CDKN1B, CDKN2A, CEBPA, CHEK2, CTNNA1, DDX41, DICER1, ETV6, FH, FLCN, GATA2, LZTR1, MAX, MBD4, MEN1, MET, MLH1, MSH2, MSH3, MSH6, MUTYH, NF1, NF2, NTHL1,  PALB2, PHOX2B, PMS2, POT1, PRKAR1A, PTCH1, PTEN, RAD51C, RAD51D, RB1, RET, RPS20, RUNX1, SDHA, SDHAF2, SDHB, SDHC, SDHD, SMAD4, SMARCA4, SMARCB1, SMARCE1, STK11, SUFU, TMEM127, TP53, TSC1, TSC2, VHL, and WT1 (sequencing and deletion/duplication); EGFR, HOXB13, KIT, MITF, PDGFRA, POLD1, and POLE (sequencing only); EPCAM and GREM1 (deletion/duplication only).     09/25/2023 Cancer Staging   Staging form: Breast, AJCC 8th Edition - Pathologic stage from 09/25/2023: Stage IIA (pT2, pN0, cM0, G3, ER-, PR-, HER2-) - Signed by Crawford Morna Pickle, NP on 10/24/2023 Stage prefix: Initial diagnosis Histologic grading system: 3 grade system   10/24/2023 -  Chemotherapy   Patient is on Treatment Plan : BREAST CMF IV q21d      CURRENT TREATMENT: CMF chemotherapy  HISTORY OF PRESENTING ILLNESS:  Joanne Morris 73 y.o. female presents for a toxicity check prior to Cycle 5, Day 1 of CMF chemotherapy.  She is accompanied by her son-in-law for this visit and her daughter was available through video call.  On exam today, Joanne Morris reports her energy and appetite are unchanged since the last visit.  She is able to complete her daily activities on her own.  She denies nausea, vomiting or abdominal pain.  She does have intermittent episodes of diarrhea that is well-controlled with Lomotil  that she takes as needed.  She denies fevers, chills, night sweats, shortness of breath, chest pain, cough, headaches, dizziness, rash/skin changes or neuropathy.  Rest of the 10 point ROS as below.  MEDICAL HISTORY:  Past Medical History:  Diagnosis Date   Abdominal pain 10/11/2012   Acute kidney injury superimposed on CKD 08/01/2018   Anemia 05/31/2011   Iron deficiency   Angina    Anxiety    Arthritis    knees (07/20/2013)   Atypical chest pain 05/31/2011   Cancer (HCC) 2025  Left Breast Cancer   Carotid stenosis, asymptomatic 04/18/2014   Claudication    Coronary artery disease 2005   Stent placed to RCA    COVID-19 virus infection 08/01/2018   Depression    Family history of colon cancer    GERD (gastroesophageal reflux disease)    Heart murmur    Hematoma of neck 04/23/2014   High cholesterol    Hyperlipemia 05/31/2011   Hypertension    Hypokalemia 05/31/2011   Leucocytosis 05/31/2011   Mass of upper outer quadrant of left breast 04/23/2023   Myocardial infarction [facility]) 1990's   1   Neck pain on right side 10/04/2014   PAD (peripheral artery disease)    Peripheral arterial disease 06/08/2013   Peripheral arterial disease   Radiculopathy of leg 10/11/2012   Stroke (HCC)    mini stroke 1st then regular stroke, denies residual on 07/20/2013   Type 2 diabetes mellitus with diabetic peripheral angiopathy without gangrene, without long-term current use of insulin  (HCC) 08/13/2023   Wears glasses     SURGICAL HISTORY: Past Surgical History:  Procedure Laterality Date   ABDOMINAL HYSTERECTOMY     ATHERECTOMY  10/05/2013   BALLOON ANGIOPLASTY, ARTERY  10/05/2013   DR LADONA   BREAST BIOPSY Left 08/05/2023   US  LT BREAST BX W LOC DEV 1ST LESION IMG BX SPEC US  GUIDE 08/05/2023 [facility]   BREAST BIOPSY Left 09/23/2023   US  LT RADIOACTIVE SEED LOC 09/23/2023 [facility]MO[facility]   BREAST LUMPECTOMY WITH RADIOACTIVE SEED LOCALIZATION Left 09/25/2023   Procedure: BREAST LUMPECTOMY WITH RADIOACTIVE SEED LOCALIZATION;  Surgeon: Aron Shoulders, MD;  Location: [facility] OR;  Service: General;  Laterality: Left;  LEFT BREAST SEED LOCALIZATION LUMPECTOMY   CAROTID ENDARTERECTOMY     CORONARY ANGIOPLASTY WITH STENT PLACEMENT     1   ENDARTERECTOMY Left 02/07/2014   Procedure: ENDARTERECTOMY CAROTID;  Surgeon: Carlin FORBES Haddock, MD;  Location: [facility] OR;  Service: Vascular;  Laterality: Left;   ENDARTERECTOMY Right 04/18/2014   Procedure: ENDARTERECTOMY RIGHT CAROTID;  Surgeon: Carlin FORBES Haddock, MD;  Location: [facility] OR;  Service: Vascular;  Laterality: Right;   ENDARTERECTOMY N/A 04/24/2014   Procedure:  IRRIGATION AND DEBRIDEMENT OF RIGHT NECK ;  Surgeon: Carlin FORBES Haddock, MD;  Location: [facility] OR;  Service: Vascular;  Laterality: N/A;   ESOPHAGOGASTRODUODENOSCOPY N/A 10/13/2012   Procedure: ESOPHAGOGASTRODUODENOSCOPY (EGD);  Surgeon: Lupita FORBES Commander, MD;  Location: [facility] ENDOSCOPY;  Service: Endoscopy;  Laterality: N/A;   KENALOG  INJECTION Bilateral 08/22/2016   Procedure: KENALOG  INJECTION BILATERAL NECK;  Surgeon: Lowery Estefana RAMAN, DO;  Location: [facility];  Service: Plastics;  Laterality: Bilateral;   LOWER EXTREMITY ANGIOGRAM  07/20/2013   Unsuccessful attempt at crossing the CTO/notes 07/20/2013   LOWER EXTREMITY ANGIOGRAM N/A 07/06/2013   Procedure: LOWER EXTREMITY ANGIOGRAM;  Surgeon: Erick JONELLE Ladona, MD;  Location: [facility] CATH LAB;  Service: Cardiovascular;  Laterality: N/A;   LOWER EXTREMITY ANGIOGRAM N/A 07/20/2013   Procedure: LOWER EXTREMITY ANGIOGRAM;  Surgeon: Erick JONELLE Ladona, MD;  Location: [facility] CATH LAB;  Service: Cardiovascular;  Laterality: N/A;   LOWER EXTREMITY ANGIOGRAM N/A 10/05/2013   Procedure: LOWER EXTREMITY ANGIOGRAM;  Surgeon: Erick JONELLE Ladona, MD;  Location: [facility] CATH LAB;  Service: Cardiovascular;  Laterality: N/A;   MASS EXCISION Right 08/22/2016   Procedure: EXCISION RIGHT NECK KELOID;  Surgeon: Lowery Estefana RAMAN, DO;  Location: [facility];  Service: Plastics;  Laterality: Right;   MASS EXCISION Right 09/18/2020   Procedure: Excision of  right neck keloid;  Surgeon: Lowery Estefana RAMAN, DO;  Location: [facility] OR;  Service: Plastics;  Laterality: Right;   PATCH ANGIOPLASTY Left 02/07/2014   Procedure: PATCH ANGIOPLASTY Carotid;  Surgeon: Carlin FORBES Haddock, MD;  Location: [facility] OR;  Service: Vascular;  Laterality: Left;   PATCH ANGIOPLASTY Right 04/18/2014   Procedure: PATCH ANGIOPLASTY USING HEMASHIELD 0.8cmx 7.6cm PATCH;  Surgeon: Carlin FORBES Haddock, MD;  Location: [facility] OR;  Service: Vascular;  Laterality: Right;   PERIPHERAL VASCULAR CATHETERIZATION N/A 07/05/2014    Procedure: Lower Extremity Angiography;  Surgeon: Gordy Bergamo, MD;  Location: [facility] INVASIVE CV LAB;  Service: Cardiovascular;  Laterality: N/A;   PORTACATH PLACEMENT N/A 09/25/2023   Procedure: INSERTION, TUNNELED CENTRAL VENOUS DEVICE, WITH PORT;  Surgeon: Aron Shoulders, MD;  Location: [facility] OR;  Service: General;  Laterality: N/A;   SENTINEL NODE BIOPSY N/A 09/25/2023   Procedure: BIOPSY, LYMPH NODE, SENTINEL;  Surgeon: Aron Shoulders, MD;  Location: [facility] OR;  Service: General;  Laterality: N/A;   TUBAL LIGATION      SOCIAL HISTORY: Social History   Socioeconomic History   Marital status: Legally Separated    Spouse name: Not on file   Number of children: Not on file   Years of education: Not on file   Highest education level: Not on file  Occupational History   Not on file  Tobacco Use   Smoking status: Former    Current packs/day: 0.00    Average packs/day: 0.5 packs/day for 4.0 years (2.0 ttl pk-yrs)    Types: Cigarettes    Start date: 05/30/2005    Quit date: 05/30/2009    Years since quitting: 14.6   Smokeless tobacco: Never  Vaping Use   Vaping status: Never Used  Substance and Sexual Activity   Alcohol use: No    Alcohol/week: 0.0 standard drinks of alcohol   Drug use: No   Sexual activity: Yes    Birth control/protection: Post-menopausal  Other Topics Concern   Not on file  Social History Narrative   Not on file   Social Drivers of Health   Financial Resource Strain: Low Risk  (08/13/2023)   Overall Financial Resource Strain (CARDIA)    Difficulty of Paying Living Expenses: Not hard at all  Food Insecurity: No Food Insecurity (08/25/2023)   Hunger Vital Sign    Worried About Running Out of Food in the Last Year: Never true    Ran Out of Food in the Last Year: Never true  Transportation Needs: No Transportation Needs (08/25/2023)   PRAPARE - Administrator, Civil Service (Medical): No    Lack of Transportation (Non-Medical): No  Physical Activity:  Sufficiently Active (08/13/2023)   Exercise Vital Sign    Days of Exercise per Week: 5 days    Minutes of Exercise per Session: 30 min  Stress: No Stress Concern Present (08/13/2023)   [employer] of Occupational Health - Occupational Stress Questionnaire    Feeling of Stress: Not at all  Social Connections: Moderately Isolated (08/13/2023)   Social Connection and Isolation Panel    Frequency of Communication with Friends and Family: More than three times a week    Frequency of Social Gatherings with Friends and Family: More than three times a week    Attends Religious Services: More than 4 times per year    Active Member of [employer] or Organizations: No    Attends Banker Meetings: Never    Marital Status: Separated  Intimate Partner Violence: Not  At Risk (08/25/2023)   Humiliation, Afraid, Rape, and Kick questionnaire    Fear of Current or Ex-Partner: No    Emotionally Abused: No    Physically Abused: No    Sexually Abused: No    FAMILY HISTORY: Family History  Problem Relation Age of Onset   Cancer Mother        unknown form, d. < 50   Colon cancer Brother        d. > 50    ALLERGIES:  has no known allergies.  MEDICATIONS:  Current Outpatient Medications  Medication Sig Dispense Refill   acetaminophen  (TYLENOL ) 650 MG CR tablet Take 1 tablet (650 mg total) by mouth every evening. FOR PAIN. 30 tablet 11   amLODipine  (NORVASC ) 10 MG tablet TAKE ONE TABLET BY MOUTH DAILY AT 9AM 90 tablet 11   aspirin  EC 81 MG tablet Take 81 mg by mouth daily in the afternoon.     busPIRone  (BUSPAR ) 5 MG tablet TAKE ONE TABLET (5MG  TOTAL) BY MOUTH TWICE DAILY 180 tablet 4   calcium  carbonate (OSCAL) 1500 (600 Ca) MG TABS tablet Take 600 mg by mouth in the morning.     Cholecalciferol (VITAMIN D3) 125 MCG (5000 UT) TABS Take 5,000 Units by mouth daily.     cilostazol  (PLETAL ) 100 MG tablet TAKE ONE TABLET (100 MG TOTAL) BY MOUTH TWICE DAILY 180 tablet 11   diphenoxylate -atropine   (LOMOTIL ) 2.5-0.025 MG tablet Take 1 tablet by mouth 4 (four) times daily as needed for diarrhea or loose stools (Max dose of 8 tabs in 24 hours). 30 tablet 1   FARXIGA  10 MG TABS tablet TAKE ONE TABLET (10MG  TOTAL) BY MOUTH IN THE MORNING DAILY AT 9AM 90 tablet 11   glipiZIDE  (GLUCOTROL  XL) 5 MG 24 hr tablet TAKE ONE TABLET (5 MG TOTAL) BY MOUTH DAILY WITH BREAKFAST 90 tablet 11   LACTOBACILLUS PO Take 1 capsule by mouth.     levocetirizine (XYZAL ) 5 MG tablet TAKE ONE TABLET (5 MG TOTAL) BY MOUTH DAILY AS NEEDED FOR ALLERGIES 90 tablet 11   lidocaine -prilocaine  (EMLA ) cream Apply to affected area once 30 g 3   LINZESS  145 MCG CAPS capsule TAKE ONE CAPSULE (145MCG TOTAL) BY MOUTH EVERY DAY 90 capsule 11   metoprolol  tartrate (LOPRESSOR ) 100 MG tablet TAKE ONE TABLET BY MOUTH TWICE DAILY @9AM -5PM 180 tablet 1   niacin  (VITAMIN B3) 500 MG ER tablet TAKE ONE TABLET (500 MG TOTAL) BY MOUTH DAILY 90 tablet 11   ondansetron  (ZOFRAN ) 8 MG tablet Take 1 tablet (8 mg total) by mouth every 8 (eight) hours as needed for nausea or vomiting. Start on the third day after chemotherapy. 30 tablet 1   pravastatin  (PRAVACHOL ) 40 MG tablet TAKE ONE TABLET BY MOUTH DAILY AT 5PM 90 tablet 11   prochlorperazine  (COMPAZINE ) 10 MG tablet Take 1 tablet (10 mg total) by mouth every 6 (six) hours as needed for nausea or vomiting. 30 tablet 1   sertraline  (ZOLOFT ) 100 MG tablet TAKE ONE TABLET BY MOUTH DAILY AT 9AM IN ADDITION TO 50 MG TABLET 90 tablet 11   sertraline  (ZOLOFT ) 50 MG tablet TAKE ONE TABLET BY MOUTH DAILY AT 5PM EVERY EVENING 90 tablet 3   valsartan  (DIOVAN ) 320 MG tablet TAKE ONE TABLET BY MOUTH DAILY AT 9AM 100 tablet 11   zolpidem  (AMBIEN  CR) 12.5 MG CR tablet Take 1 tablet (12.5 mg total) by mouth at bedtime. 30 tablet 5   HYDROcodone -acetaminophen  (NORCO/VICODIN) 5-325 MG tablet Take  1 tablet by mouth daily as needed for moderate pain (pain score 4-6) or severe pain (pain score 7-10). 30 tablet 0   No  current facility-administered medications for this visit.   Facility-Administered Medications Ordered in Other Visits  Medication Dose Route Frequency Provider Last Rate Last Admin   0.9 %  sodium chloride  infusion   Intravenous Continuous Odean Potts, MD       cyclophosphamide  (CYTOXAN ) 820 mg in sodium chloride  0.9 % 250 mL chemo infusion  500 mg/m2 (Order-Specific) Intravenous Once Gudena, Vinay, MD       dexamethasone  (DECADRON ) injection 10 mg  10 mg Intravenous Once Gudena, Vinay, MD       fluorouracil  (ADRUCIL ) chemo injection 650 mg  400 mg/m2 (Order-Specific) Intravenous Once Gudena, Vinay, MD       methotrexate  (PF) chemo injection 49.25 mg  30 mg/m2 (Order-Specific) Intravenous Once Gudena, Vinay, MD       palonosetron  (ALOXI ) injection 0.25 mg  0.25 mg Intravenous Once Gudena, Vinay, MD        REVIEW OF SYSTEMS:   Constitutional: ( - ) fevers, ( - )  chills , ( - ) night sweats Eyes: ( - ) blurriness of vision, ( - ) double vision, ( - ) watery eyes Ears, nose, mouth, throat, and face: ( - ) mucositis, ( - ) sore throat Respiratory: ( - ) cough, ( - ) dyspnea, ( - ) wheezes Cardiovascular: ( - ) palpitation, ( - ) chest discomfort, ( - ) lower extremity swelling Gastrointestinal:  ( - ) nausea, ( - ) heartburn, (+ ) change in bowel habits Skin: ( - ) abnormal skin rashes Lymphatics: ( - ) new lymphadenopathy, ( - ) easy bruising Neurological: ( - ) numbness, ( - ) tingling, ( - ) new weaknesses Behavioral/Psych: ( - ) mood change, ( - ) new changes  All other systems were reviewed with the patient and are negative.  PHYSICAL EXAMINATION: ECOG PERFORMANCE STATUS: 1 - Symptomatic but completely ambulatory  Vitals:   01/16/24 0939 01/16/24 0944  BP: (!) 162/78 (!) 160/79  Pulse: 70   Resp: 13   Temp: 97.9 F (36.6 C)   SpO2: 97%    Filed Weights   01/16/24 0939  Weight: 145 lb 3.2 oz (65.9 kg)    GENERAL: well appearing female in NAD  SKIN: skin color, texture,  turgor are normal, no rashes or significant lesions EYES: conjunctiva are pink and non-injected, sclera clear LUNGS: clear to auscultation and percussion with normal breathing effort HEART: regular rate & rhythm and no murmurs and no lower extremity edema Musculoskeletal: no cyanosis of digits and no clubbing  PSYCH: alert & oriented x 3, fluent speech NEURO: no focal motor/sensory deficits  LABORATORY DATA:  I have reviewed the data as listed    Latest Ref Rng & Units 01/16/2024    9:08 AM 12/25/2023    9:01 AM 12/04/2023    8:41 AM  CBC  WBC 4.0 - 10.5 K/uL 10.9  8.8  8.5   Hemoglobin 12.0 - 15.0 g/dL 86.9  87.3  87.1   Hematocrit 36.0 - 46.0 % 40.0  39.7  39.8   Platelets 150 - 400 K/uL 284  268  366        Latest Ref Rng & Units 01/16/2024    9:08 AM 12/25/2023    9:01 AM 12/04/2023    8:41 AM  CMP  Glucose 70 - 99 mg/dL 885  874  841  BUN 8 - 23 mg/dL 13  14  18    Creatinine 0.44 - 1.00 mg/dL 8.98  8.82  8.90   Sodium 135 - 145 mmol/L 140  140  140   Potassium 3.5 - 5.1 mmol/L 3.8  3.8  3.5   Chloride 98 - 111 mmol/L 102  105  105   CO2 22 - 32 mmol/L 26  28  27    Calcium  8.9 - 10.3 mg/dL 89.3  89.8  89.4   Total Protein 6.5 - 8.1 g/dL 7.9  7.2  8.0   Total Bilirubin 0.0 - 1.2 mg/dL 0.4  0.3  0.4   Alkaline Phos 38 - 126 U/L 90  80  90   AST 15 - 41 U/L 26  15  17    ALT 0 - 44 U/L 20  13  14      RADIOGRAPHIC STUDIES: I have personally reviewed the radiological images as listed and agreed with the findings in the report. No results found.  ASSESSMENT: Joanne Morris is a 73 y.o. female who presents to the clinic for evaluation for continued management of left breast cancer.   #Malignant neoplasm of upper-outer quadrant of left breast in female, estrogen receptor negative (HCC) --08/11/2023:Screening mammogram detected left breast mass upper outer quadrant 1.9 cm by ultrasound axilla negative biopsy: Grade 2 IDC triple negative Ki-67 70%  --Treatment plan  includes: 09/25/2023: Left lumpectomy: Grade 3 IDC 2.9 cm, margins negative, LVI not identified, ER 0%, PR 0%, HER2 negative, Ki67 70%, 0/2 lymph nodes negative Adjuvant chemotherapy with CMF x 6 cycles (patient has cardiac disease, low performance status, diabetes) Adjuvant radiation therapy PLAN: --Due for cycle 5, day 1 of CMF chemotherapy. --Labs today reviewed and adequate for treatment. WBC 10.9, Hgb 13.0, Plt 284, creatinine improved to 1.01, LFTS normal.  --Proceed with treatment today without any dose modifications --RTC in 3 weeks for labs and follow up prior to Cycl e6, Day 1 of CMF.   #Diarrhea: --Symptoms are managed with lomotil  as needed --continue to monitor.   No orders of the defined types were placed in this encounter.   All questions were answered. The patient knows to call the clinic with any problems, questions or concerns.  I have spent a total of 30 minutes minutes of face-to-face and non-face-to-face time, preparing to see the patient, performing a medically appropriate examination, counseling and educating the patient, documenting clinical information in the electronic health record, independently interpreting results and communicating results to the patient, and care coordination.   Johnston Police, PA-C Department of Hematology/Oncology [facility] at [facility] Phone: [phone]

## 2024-01-16 ENCOUNTER — Inpatient Hospital Stay: Attending: Hematology and Oncology

## 2024-01-16 ENCOUNTER — Inpatient Hospital Stay

## 2024-01-16 ENCOUNTER — Inpatient Hospital Stay (HOSPITAL_BASED_OUTPATIENT_CLINIC_OR_DEPARTMENT_OTHER): Admitting: Physician Assistant

## 2024-01-16 VITALS — BP 136/66

## 2024-01-16 VITALS — BP 160/79 | HR 70 | Temp 97.9°F | Resp 13 | Wt 145.2 lb

## 2024-01-16 DIAGNOSIS — Z79631 Long term (current) use of antimetabolite agent: Secondary | ICD-10-CM | POA: Diagnosis not present

## 2024-01-16 DIAGNOSIS — Z5111 Encounter for antineoplastic chemotherapy: Secondary | ICD-10-CM | POA: Insufficient documentation

## 2024-01-16 DIAGNOSIS — Z79630 Long term (current) use of alkylating agent: Secondary | ICD-10-CM | POA: Insufficient documentation

## 2024-01-16 DIAGNOSIS — C50412 Malignant neoplasm of upper-outer quadrant of left female breast: Secondary | ICD-10-CM | POA: Insufficient documentation

## 2024-01-16 DIAGNOSIS — Z171 Estrogen receptor negative status [ER-]: Secondary | ICD-10-CM | POA: Insufficient documentation

## 2024-01-16 LAB — CMP (CANCER CENTER ONLY)
ALT: 20 U/L (ref 0–44)
AST: 26 U/L (ref 15–41)
Albumin: 4.6 g/dL (ref 3.5–5.0)
Alkaline Phosphatase: 90 U/L (ref 38–126)
Anion gap: 12 (ref 5–15)
BUN: 13 mg/dL (ref 8–23)
CO2: 26 mmol/L (ref 22–32)
Calcium: 10.6 mg/dL — ABNORMAL HIGH (ref 8.9–10.3)
Chloride: 102 mmol/L (ref 98–111)
Creatinine: 1.01 mg/dL — ABNORMAL HIGH (ref 0.44–1.00)
GFR, Estimated: 58 mL/min — ABNORMAL LOW (ref >60)
Glucose, Bld: 114 mg/dL — ABNORMAL HIGH (ref 70–99)
Potassium: 3.8 mmol/L (ref 3.5–5.1)
Sodium: 140 mmol/L (ref 135–145)
Total Bilirubin: 0.4 mg/dL (ref 0.0–1.2)
Total Protein: 7.9 g/dL (ref 6.5–8.1)

## 2024-01-16 LAB — CBC WITH DIFFERENTIAL (CANCER CENTER ONLY)
Abs Immature Granulocytes: 0.57 K/uL — ABNORMAL HIGH (ref 0.00–0.07)
Basophils Absolute: 0.1 K/uL (ref 0.0–0.1)
Basophils Relative: 1 %
Eosinophils Absolute: 0.1 K/uL (ref 0.0–0.5)
Eosinophils Relative: 1 %
HCT: 40 % (ref 36.0–46.0)
Hemoglobin: 13 g/dL (ref 12.0–15.0)
Immature Granulocytes: 5 %
Lymphocytes Relative: 29 %
Lymphs Abs: 3.2 K/uL (ref 0.7–4.0)
MCH: 25.2 pg — ABNORMAL LOW (ref 26.0–34.0)
MCHC: 32.5 g/dL (ref 30.0–36.0)
MCV: 77.5 fL — ABNORMAL LOW (ref 80.0–100.0)
Monocytes Absolute: 0.8 K/uL (ref 0.1–1.0)
Monocytes Relative: 7 %
Neutro Abs: 6.2 K/uL (ref 1.7–7.7)
Neutrophils Relative %: 57 %
Platelet Count: 284 K/uL (ref 150–400)
RBC: 5.16 MIL/uL — ABNORMAL HIGH (ref 3.87–5.11)
RDW: 16.6 % — ABNORMAL HIGH (ref 11.5–15.5)
WBC Count: 10.9 K/uL — ABNORMAL HIGH (ref 4.0–10.5)
nRBC: 0.3 % — ABNORMAL HIGH (ref 0.0–0.2)

## 2024-01-16 MED ORDER — METHOTREXATE SODIUM CHEMO INJECTION (PF) 50 MG/2ML
30.0000 mg/m2 | Freq: Once | INTRAMUSCULAR | Status: AC
  Administered 2024-01-16: 49.25 mg via INTRAVENOUS
  Filled 2024-01-16: qty 1.97

## 2024-01-16 MED ORDER — DEXAMETHASONE SOD PHOSPHATE PF 10 MG/ML IJ SOLN
10.0000 mg | Freq: Once | INTRAMUSCULAR | Status: AC
  Administered 2024-01-16: 10 mg via INTRAVENOUS

## 2024-01-16 MED ORDER — SODIUM CHLORIDE 0.9 % IV SOLN
500.0000 mg/m2 | Freq: Once | INTRAVENOUS | Status: AC
  Administered 2024-01-16: 820 mg via INTRAVENOUS
  Filled 2024-01-16: qty 41

## 2024-01-16 MED ORDER — HYDROCODONE-ACETAMINOPHEN 5-325 MG PO TABS: 1.0000 | ORAL_TABLET | Freq: Every day | ORAL | 0 refills | Status: DC | PRN

## 2024-01-16 MED ORDER — FLUOROURACIL CHEMO INJECTION 2.5 GM/50ML
400.0000 mg/m2 | Freq: Once | INTRAVENOUS | Status: AC
  Administered 2024-01-16: 650 mg via INTRAVENOUS
  Filled 2024-01-16: qty 13

## 2024-01-16 MED ORDER — PALONOSETRON HCL INJECTION 0.25 MG/5ML
0.2500 mg | Freq: Once | INTRAVENOUS | Status: AC
  Administered 2024-01-16: 0.25 mg via INTRAVENOUS
  Filled 2024-01-16: qty 5

## 2024-01-16 MED ORDER — SODIUM CHLORIDE 0.9 % IV SOLN: INTRAVENOUS | Status: DC

## 2024-01-16 NOTE — Patient Instructions (Signed)
 [facility] - A DEPT OF [facility]  Discharge Instructions: Thank you for choosing [facility] to provide your oncology and hematology care.   If you have a lab appointment with the Cancer Center, please go directly to the Cancer Center and check in at the registration area.   Wear comfortable clothing and clothing appropriate for easy access to any Portacath or PICC line.   We strive to give you quality time with your provider. You may need to reschedule your appointment if you arrive late (15 or more minutes).  Arriving late affects you and other patients whose appointments are after yours.  Also, if you miss three or more appointments without notifying the office, you may be dismissed from the clinic at the provider's discretion.      For prescription refill requests, have your pharmacy contact our office and allow 72 hours for refills to be completed.    Today you received the following chemotherapy and/or immunotherapy agents: cyclophosphamide  (CYTOXAN ), fluorouracil  (ADRUCIL ), methotrexate     To help prevent nausea and vomiting after your treatment, we encourage you to take your nausea medication as directed.  BELOW ARE SYMPTOMS THAT SHOULD BE REPORTED IMMEDIATELY: *FEVER GREATER THAN 100.4 F (38 C) OR HIGHER *CHILLS OR SWEATING *NAUSEA AND VOMITING THAT IS NOT CONTROLLED WITH YOUR NAUSEA MEDICATION *UNUSUAL SHORTNESS OF BREATH *UNUSUAL BRUISING OR BLEEDING *URINARY PROBLEMS (pain or burning when urinating, or frequent urination) *BOWEL PROBLEMS (unusual diarrhea, constipation, pain near the anus) TENDERNESS IN MOUTH AND THROAT WITH OR WITHOUT PRESENCE OF ULCERS (sore throat, sores in mouth, or a toothache) UNUSUAL RASH, SWELLING OR PAIN  UNUSUAL VAGINAL DISCHARGE OR ITCHING   Items with * indicate a potential emergency and should be followed up as soon as possible or go to the Emergency Department if any problems should  occur.  Please show the CHEMOTHERAPY ALERT CARD or IMMUNOTHERAPY ALERT CARD at check-in to the Emergency Department and triage nurse.  Should you have questions after your visit or need to cancel or reschedule your appointment, please contact [facility] - A DEPT OF [facility]  Dept: [phone]  and follow the prompts.  Office hours are 8:00 a.m. to 4:30 p.m. Monday - Friday. Please note that voicemails left after 4:00 p.m. may not be returned until the following business day.  We are closed weekends and major holidays. You have access to a nurse at all times for urgent questions. Please call the main number to the clinic Dept: [phone] and follow the prompts.   For any non-urgent questions, you may also contact your provider using MyChart. We now offer e-Visits for anyone 66 and older to request care online for non-urgent symptoms. For details visit [email or web address].   Also download the MyChart app! Go to the app store, search MyChart, open the app, select [place], and log in with your MyChart username and password.

## 2024-01-22 ENCOUNTER — Other Ambulatory Visit: Payer: Self-pay | Admitting: Family

## 2024-01-22 DIAGNOSIS — F3341 Major depressive disorder, recurrent, in partial remission: Secondary | ICD-10-CM

## 2024-01-22 DIAGNOSIS — I10 Essential (primary) hypertension: Secondary | ICD-10-CM

## 2024-02-04 ENCOUNTER — Inpatient Hospital Stay: Admitting: Hematology and Oncology

## 2024-02-04 ENCOUNTER — Inpatient Hospital Stay

## 2024-02-04 VITALS — BP 156/71 | HR 59 | Temp 97.4°F | Resp 18 | Ht 59.0 in | Wt 144.5 lb

## 2024-02-04 DIAGNOSIS — C50412 Malignant neoplasm of upper-outer quadrant of left female breast: Secondary | ICD-10-CM | POA: Diagnosis not present

## 2024-02-04 DIAGNOSIS — Z171 Estrogen receptor negative status [ER-]: Secondary | ICD-10-CM

## 2024-02-04 DIAGNOSIS — Z5111 Encounter for antineoplastic chemotherapy: Secondary | ICD-10-CM | POA: Diagnosis not present

## 2024-02-04 LAB — CBC WITH DIFFERENTIAL (CANCER CENTER ONLY)
Abs Immature Granulocytes: 0.25 K/uL — ABNORMAL HIGH (ref 0.00–0.07)
Basophils Absolute: 0 K/uL (ref 0.0–0.1)
Basophils Relative: 1 %
Eosinophils Absolute: 0.1 K/uL (ref 0.0–0.5)
Eosinophils Relative: 2 %
HCT: 39.9 % (ref 36.0–46.0)
Hemoglobin: 12.7 g/dL (ref 12.0–15.0)
Immature Granulocytes: 4 %
Lymphocytes Relative: 38 %
Lymphs Abs: 2.4 K/uL (ref 0.7–4.0)
MCH: 24.9 pg — ABNORMAL LOW (ref 26.0–34.0)
MCHC: 31.8 g/dL (ref 30.0–36.0)
MCV: 78.2 fL — ABNORMAL LOW (ref 80.0–100.0)
Monocytes Absolute: 0.7 K/uL (ref 0.1–1.0)
Monocytes Relative: 10 %
Neutro Abs: 2.9 K/uL (ref 1.7–7.7)
Neutrophils Relative %: 45 %
Platelet Count: 346 K/uL (ref 150–400)
RBC: 5.1 MIL/uL (ref 3.87–5.11)
RDW: 16.7 % — ABNORMAL HIGH (ref 11.5–15.5)
WBC Count: 6.4 K/uL (ref 4.0–10.5)
nRBC: 0 % (ref 0.0–0.2)

## 2024-02-04 LAB — CMP (CANCER CENTER ONLY)
ALT: 20 U/L (ref 0–44)
AST: 26 U/L (ref 15–41)
Albumin: 4.6 g/dL (ref 3.5–5.0)
Alkaline Phosphatase: 87 U/L (ref 38–126)
Anion gap: 11 (ref 5–15)
BUN: 17 mg/dL (ref 8–23)
CO2: 26 mmol/L (ref 22–32)
Calcium: 10.6 mg/dL — ABNORMAL HIGH (ref 8.9–10.3)
Chloride: 103 mmol/L (ref 98–111)
Creatinine: 1.06 mg/dL — ABNORMAL HIGH (ref 0.44–1.00)
GFR, Estimated: 55 mL/min — ABNORMAL LOW
Glucose, Bld: 128 mg/dL — ABNORMAL HIGH (ref 70–99)
Potassium: 4 mmol/L (ref 3.5–5.1)
Sodium: 140 mmol/L (ref 135–145)
Total Bilirubin: 0.3 mg/dL (ref 0.0–1.2)
Total Protein: 7.9 g/dL (ref 6.5–8.1)

## 2024-02-04 MED ORDER — PALONOSETRON HCL INJECTION 0.25 MG/5ML
0.2500 mg | Freq: Once | INTRAVENOUS | Status: AC
  Administered 2024-02-04: 0.25 mg via INTRAVENOUS
  Filled 2024-02-04: qty 5

## 2024-02-04 MED ORDER — DEXAMETHASONE SOD PHOSPHATE PF 10 MG/ML IJ SOLN
10.0000 mg | Freq: Once | INTRAMUSCULAR | Status: AC
  Administered 2024-02-04: 10 mg via INTRAVENOUS

## 2024-02-04 MED ORDER — METHOTREXATE SODIUM CHEMO INJECTION (PF) 50 MG/2ML
30.0000 mg/m2 | Freq: Once | INTRAMUSCULAR | Status: AC
  Administered 2024-02-04: 49.25 mg via INTRAVENOUS
  Filled 2024-02-04: qty 1.97

## 2024-02-04 MED ORDER — SODIUM CHLORIDE 0.9 % IV SOLN: INTRAVENOUS | Status: DC

## 2024-02-04 MED ORDER — SODIUM CHLORIDE 0.9 % IV SOLN
500.0000 mg/m2 | Freq: Once | INTRAVENOUS | Status: AC
  Administered 2024-02-04: 820 mg via INTRAVENOUS
  Filled 2024-02-04: qty 41

## 2024-02-04 MED ORDER — FLUOROURACIL CHEMO INJECTION 2.5 GM/50ML
400.0000 mg/m2 | Freq: Once | INTRAVENOUS | Status: AC
  Administered 2024-02-04: 650 mg via INTRAVENOUS
  Filled 2024-02-04: qty 13

## 2024-02-04 NOTE — Patient Instructions (Signed)
 [facility] - A DEPT OF [facility]  Discharge Instructions: Thank you for choosing [facility] to provide your oncology and hematology care.   If you have a lab appointment with the Cancer Center, please go directly to the Cancer Center and check in at the registration area.   Wear comfortable clothing and clothing appropriate for easy access to any Portacath or PICC line.   We strive to give you quality time with your provider. You may need to reschedule your appointment if you arrive late (15 or more minutes).  Arriving late affects you and other patients whose appointments are after yours.  Also, if you miss three or more appointments without notifying the office, you may be dismissed from the clinic at the provider's discretion.      For prescription refill requests, have your pharmacy contact our office and allow 72 hours for refills to be completed.    Today you received the following chemotherapy and/or immunotherapy agents: cyclophosphamide  (CYTOXAN ), fluorouracil  (ADRUCIL ), methotrexate     To help prevent nausea and vomiting after your treatment, we encourage you to take your nausea medication as directed.  BELOW ARE SYMPTOMS THAT SHOULD BE REPORTED IMMEDIATELY: *FEVER GREATER THAN 100.4 F (38 C) OR HIGHER *CHILLS OR SWEATING *NAUSEA AND VOMITING THAT IS NOT CONTROLLED WITH YOUR NAUSEA MEDICATION *UNUSUAL SHORTNESS OF BREATH *UNUSUAL BRUISING OR BLEEDING *URINARY PROBLEMS (pain or burning when urinating, or frequent urination) *BOWEL PROBLEMS (unusual diarrhea, constipation, pain near the anus) TENDERNESS IN MOUTH AND THROAT WITH OR WITHOUT PRESENCE OF ULCERS (sore throat, sores in mouth, or a toothache) UNUSUAL RASH, SWELLING OR PAIN  UNUSUAL VAGINAL DISCHARGE OR ITCHING   Items with * indicate a potential emergency and should be followed up as soon as possible or go to the Emergency Department if any problems should  occur.  Please show the CHEMOTHERAPY ALERT CARD or IMMUNOTHERAPY ALERT CARD at check-in to the Emergency Department and triage nurse.  Should you have questions after your visit or need to cancel or reschedule your appointment, please contact [facility] - A DEPT OF [facility]  Dept: [phone]  and follow the prompts.  Office hours are 8:00 a.m. to 4:30 p.m. Monday - Friday. Please note that voicemails left after 4:00 p.m. may not be returned until the following business day.  We are closed weekends and major holidays. You have access to a nurse at all times for urgent questions. Please call the main number to the clinic Dept: [phone] and follow the prompts.   For any non-urgent questions, you may also contact your provider using MyChart. We now offer e-Visits for anyone 66 and older to request care online for non-urgent symptoms. For details visit [email or web address].   Also download the MyChart app! Go to the app store, search MyChart, open the app, select [place], and log in with your MyChart username and password.

## 2024-02-04 NOTE — Progress Notes (Signed)
 "  Patient Care Team: Lucius Krabbe, NP as PCP - General (Family Medicine) Tye Leach, NP as Nurse Practitioner [name], [name] [name], MD as Consulting Physician (Vascular Surgery) [name], MD as Attending Physician (Family Medicine) [name], RN as Oncology Nurse Navigator Aron Shoulders, MD as Consulting Physician (General Surgery) Odean Potts, MD as Consulting Physician (Hematology and Oncology) Shannon Agent, MD as Consulting Physician (Radiation Oncology)  DIAGNOSIS:  Encounter Diagnosis  Name Primary?   Malignant neoplasm of upper-outer quadrant of left breast in female, estrogen receptor negative (HCC) Yes    SUMMARY OF ONCOLOGIC HISTORY: Oncology History  Malignant neoplasm of upper-outer quadrant of left breast in female, estrogen receptor negative (HCC)  08/11/2023 Initial Diagnosis   Screening mammogram detected left breast mass upper outer quadrant 1.9 cm by ultrasound axilla negative biopsy: Grade 2 IDC triple negative Ki-67 70%   08/13/2023 Cancer Staging   Staging form: Breast, AJCC 8th Edition - Clinical stage from 08/13/2023: Stage IB (cT1c, cN0, cM0, G2, ER-, PR-, HER2-) - Signed by Odean Potts, MD on 08/13/2023 Stage prefix: Initial diagnosis Histologic grading system: 3 grade system Laterality: Left Staged by: Pathologist and managing physician Stage used in treatment planning: Yes National guidelines used in treatment planning: Yes Type of national guideline used in treatment planning: NCCN    Genetic Testing   [facility] CancerNext-Expanded Panel+RNA was Negative. Report date is 08/23/2023.   The CancerNext-Expanded gene panel offered by [facility] and includes sequencing, rearrangement, and RNA analysis for the following 77 genes: AIP, ALK, APC, ATM, AXIN2, BAP1, BARD1, BMPR1A, BRCA1, BRCA2, BRIP1, CDC73, CDH1, CDK4, CDKN1B, CDKN2A, CEBPA, CHEK2, CTNNA1, DDX41, DICER1, ETV6, FH, FLCN, GATA2, LZTR1, MAX, MBD4, MEN1, MET, MLH1, MSH2,  MSH3, MSH6, MUTYH, NF1, NF2, NTHL1, PALB2, PHOX2B, PMS2, POT1, PRKAR1A, PTCH1, PTEN, RAD51C, RAD51D, RB1, RET, RPS20, RUNX1, SDHA, SDHAF2, SDHB, SDHC, SDHD, SMAD4, SMARCA4, SMARCB1, SMARCE1, STK11, SUFU, TMEM127, TP53, TSC1, TSC2, VHL, and WT1 (sequencing and deletion/duplication); EGFR, HOXB13, KIT, MITF, PDGFRA, POLD1, and POLE (sequencing only); EPCAM and GREM1 (deletion/duplication only).     09/25/2023 Cancer Staging   Staging form: Breast, AJCC 8th Edition - Pathologic stage from 09/25/2023: Stage IIA (pT2, pN0, cM0, G3, ER-, PR-, HER2-) - Signed by Crawford Morna Pickle, NP on 10/24/2023 Stage prefix: Initial diagnosis Histologic grading system: 3 grade system   10/24/2023 -  Chemotherapy   Patient is on Treatment Plan : BREAST CMF IV q21d       CHIEF COMPLIANT: Cycle 6 CMF  HISTORY OF PRESENT ILLNESS:   History of Present Illness Joanne Morris is a 73 year old female with stage IIA triple-negative invasive ductal carcinoma of the left breast who presents for her final cycle of adjuvant CMF chemotherapy and evaluation for transition to adjuvant radiation therapy.  She is receiving her final cycle of adjuvant CMF today and has tolerated prior cycles well without significant nausea, constipation, diarrhea, taste change, appetite change, or alopecia. Earlier mild fatigue and one episode of diarrhea have resolved, and she reports no current chemotherapy toxicities.  Her port remains in place for chemotherapy administration. She asks about timing of removal, with plans to remove it in January after chemotherapy completion.  She is preparing for adjuvant radiation therapy and is concerned about potential side effects. She is interested in alternative regimens, including hypofractionated schedules, and plans to meet with a radiation oncologist in [place] to choose a plan by early January to avoid delay in care.  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 She remains active at home caring for her great niece.       ALLERGIES:  has no known allergies.  MEDICATIONS:  Current Outpatient Medications  Medication Sig Dispense Refill   acetaminophen  (TYLENOL ) 650 MG CR tablet Take 1 tablet (650 mg total) by mouth every evening. FOR PAIN. 30 tablet 11   amLODipine  (NORVASC ) 10 MG tablet TAKE ONE TABLET BY MOUTH DAILY AT 9AM 90 tablet 11   aspirin  EC 81 MG tablet Take 81 mg by mouth daily in the afternoon.     busPIRone  (BUSPAR ) 5 MG tablet TAKE ONE TABLET (5MG  TOTAL) BY MOUTH TWICE DAILY 180 tablet 4   calcium  carbonate (OSCAL) 1500 (600 Ca) MG TABS tablet Take 600 mg by mouth in the morning.     Cholecalciferol (VITAMIN D3) 125 MCG (5000 UT) TABS Take 5,000 Units by mouth daily.     cilostazol  (PLETAL ) 100 MG tablet TAKE ONE TABLET (100 MG TOTAL) BY MOUTH TWICE DAILY 180 tablet 11   diphenoxylate -atropine  (LOMOTIL ) 2.5-0.025 MG tablet Take 1 tablet by mouth 4 (four) times daily as needed for diarrhea or loose stools (Max dose of 8 tabs in 24 hours). 30 tablet 1   FARXIGA  10 MG TABS tablet TAKE ONE TABLET (10MG  TOTAL) BY MOUTH IN THE MORNING DAILY AT 9AM 90 tablet 11   glipiZIDE  (GLUCOTROL  XL) 5 MG 24 hr tablet TAKE ONE TABLET (5 MG TOTAL) BY MOUTH DAILY WITH BREAKFAST 90 tablet 11   HYDROcodone -acetaminophen  (NORCO/VICODIN) 5-325 MG tablet Take 1 tablet by mouth daily as needed for moderate pain (pain score 4-6) or severe pain (pain score 7-10). 30 tablet 0   LACTOBACILLUS PO Take 1 capsule by mouth.     levocetirizine (XYZAL ) 5 MG tablet TAKE ONE TABLET (5 MG TOTAL) BY MOUTH DAILY AS NEEDED FOR ALLERGIES 90 tablet 11   lidocaine -prilocaine  (EMLA ) cream Apply to affected area once 30 g 3   LINZESS  145 MCG CAPS capsule TAKE ONE CAPSULE (145MCG TOTAL) BY MOUTH EVERY DAY 90 capsule 11   metoprolol  tartrate (LOPRESSOR ) 100 MG tablet TAKE ONE TABLET BY MOUTH TWICE DAILY AT 9AM & 5PM 180 tablet 11    niacin  (VITAMIN B3) 500 MG ER tablet TAKE ONE TABLET (500 MG TOTAL) BY MOUTH DAILY 90 tablet 11   ondansetron  (ZOFRAN ) 8 MG tablet Take 1 tablet (8 mg total) by mouth every 8 (eight) hours as needed for nausea or vomiting. Start on the third day after chemotherapy. 30 tablet 1   pravastatin  (PRAVACHOL ) 40 MG tablet TAKE ONE TABLET BY MOUTH DAILY AT 5PM 90 tablet 11   prochlorperazine  (COMPAZINE ) 10 MG tablet Take 1 tablet (10 mg total) by mouth every 6 (six) hours as needed for nausea or vomiting. 30 tablet 1   sertraline  (ZOLOFT ) 100 MG tablet TAKE ONE TABLET BY MOUTH DAILY AT 9AM IN ADDITION TO 50 MG TABLET 90 tablet 11   sertraline  (ZOLOFT ) 50 MG tablet TAKE ONE TABLET BY MOUTH DAILY AT 5PM EVERY EVENING 90 tablet 11   valsartan  (DIOVAN ) 320 MG tablet TAKE ONE TABLET BY MOUTH DAILY AT 9AM 100 tablet 11   zolpidem  (AMBIEN  CR) 12.5 MG CR tablet Take 1 tablet (12.5 mg total) by mouth at bedtime. 30 tablet 5   No current facility-administered medications for this visit.    PHYSICAL EXAMINATION: ECOG PERFORMANCE STATUS: 1 - Symptomatic but completely ambulatory  Vitals:   02/04/24 0957  BP: ROLLEN)  156/71  Pulse: (!) 59  Resp: 18  Temp: (!) 97.4 F (36.3 C)  SpO2: 99%   Filed Weights   02/04/24 0957  Weight: 144 lb 8 oz (65.5 kg)      LABORATORY DATA:  I have reviewed the data as listed    Latest Ref Rng & Units 01/16/2024    9:08 AM 12/25/2023    9:01 AM 12/04/2023    8:41 AM  CMP  Glucose 70 - 99 mg/dL 885  874  841   BUN 8 - 23 mg/dL 13  14  18    Creatinine 0.44 - 1.00 mg/dL 8.98  8.82  8.90   Sodium 135 - 145 mmol/L 140  140  140   Potassium 3.5 - 5.1 mmol/L 3.8  3.8  3.5   Chloride 98 - 111 mmol/L 102  105  105   CO2 22 - 32 mmol/L 26  28  27    Calcium  8.9 - 10.3 mg/dL 89.3  89.8  89.4   Total Protein 6.5 - 8.1 g/dL 7.9  7.2  8.0   Total Bilirubin 0.0 - 1.2 mg/dL 0.4  0.3  0.4   Alkaline Phos 38 - 126 U/L 90  80  90   AST 15 - 41 U/L 26  15  17    ALT 0 - 44 U/L 20   13  14      Lab Results  Component Value Date   WBC 10.9 (H) 01/16/2024   HGB 13.0 01/16/2024   HCT 40.0 01/16/2024   MCV 77.5 (L) 01/16/2024   PLT 284 01/16/2024   NEUTROABS 6.2 01/16/2024    ASSESSMENT & PLAN:  Malignant neoplasm of upper-outer quadrant of left breast in female, estrogen receptor negative (HCC) 08/11/2023:Screening mammogram detected left breast mass upper outer quadrant 1.9 cm by ultrasound axilla negative biopsy: Grade 2 IDC triple negative Ki-67 70%    Treatment plan: 09/25/2023: Left lumpectomy: Grade 3 IDC 2.9 cm, margins negative, LVI not identified, ER 0%, PR 0%, HER2 negative, Ki67 70%, 0/2 lymph nodes negative Adjuvant chemotherapy with CMF x 6 cycles (patient has cardiac disease, low performance status, diabetes) Adjuvant radiation therapy ------------------------------------------------------------------------------------------------------------- Current treatment: Cycle 6 CMF Chemo toxicities: Denies any nausea vomiting Mild fatigue Denies any nausea or vomiting.  She had 1 episode of diarrhea.   Return to clinic after radiation is complete She does me that she is very reluctant to do radiation.  However I convinced her to meet and discuss the pros and cons of radiation.  I sent a message to Dr. Shannon.  She lives near [place] and might want to do radiation closer to home.  However she is willing to come to [facility] if necessary.  I sent a message to Dr. Aron to remove the port. Return to clinic after radiation is complete      No orders of the defined types were placed in this encounter.  The patient has a good understanding of the overall plan. she agrees with it. she will call with any problems that may develop before the next visit here.  I personally spent a total of 30 minutes in the care of the patient today including preparing to see the patient, getting/reviewing separately obtained history, performing a medically appropriate  exam/evaluation, counseling and educating, placing orders, referring and communicating with other health care professionals, documenting clinical information in the EHR, independently interpreting results, communicating results, and coordinating care.   Viinay K Aarav Burgett, MD 02/04/2024    "

## 2024-02-04 NOTE — Assessment & Plan Note (Signed)
 08/11/2023:Screening mammogram detected left breast mass upper outer quadrant 1.9 cm by ultrasound axilla negative biopsy: Grade 2 IDC triple negative Ki-67 70%    Treatment plan: 09/25/2023: Left lumpectomy: Grade 3 IDC 2.9 cm, margins negative, LVI not identified, ER 0%, PR 0%, HER2 negative, Ki67 70%, 0/2 lymph nodes negative Adjuvant chemotherapy with CMF x 6 cycles (patient has cardiac disease, low performance status, diabetes) Adjuvant radiation therapy ------------------------------------------------------------------------------------------------------------- Current treatment: Cycle 6 CMF Chemo toxicities: Denies any nausea vomiting Mild fatigue Denies any nausea or vomiting.  She had 1 episode of diarrhea.   Return to clinic after radiation is complete

## 2024-02-06 ENCOUNTER — Encounter: Payer: Self-pay | Admitting: *Deleted

## 2024-02-06 DIAGNOSIS — C50412 Malignant neoplasm of upper-outer quadrant of left female breast: Secondary | ICD-10-CM

## 2024-02-06 NOTE — Progress Notes (Signed)
 Order placed for Rad Onc in [place] per Dr. Gudena and Dr. Colton order.

## 2024-02-16 ENCOUNTER — Other Ambulatory Visit: Payer: Self-pay | Admitting: General Surgery

## 2024-02-19 ENCOUNTER — Encounter: Payer: Self-pay | Admitting: *Deleted

## 2024-02-26 ENCOUNTER — Encounter: Payer: Self-pay | Admitting: Hematology and Oncology

## 2024-02-26 ENCOUNTER — Other Ambulatory Visit: Payer: Self-pay | Admitting: Family

## 2024-02-26 DIAGNOSIS — G894 Chronic pain syndrome: Secondary | ICD-10-CM

## 2024-02-26 NOTE — Telephone Encounter (Signed)
 Copied from CRM [id number]. Topic: Clinical - Medication Refill >> Feb 26, 2024  2:17 PM Sasha M wrote: Medication: HYDROcodone -acetaminophen  (NORCO/VICODIN) 5-325 MG tablet  Has the patient contacted their pharmacy? No (Agent: If no, request that the patient contact the pharmacy for the refill. If patient does not wish to contact the pharmacy document the reason why and proceed with request.) (Agent: If yes, when and what did the pharmacy advise?)  This is the patient's preferred pharmacy:   [facility] [place] - [address] [place] [address] Phone: [phone] Fax: [phone]   Is this the correct pharmacy for this prescription? Yes If no, delete pharmacy and type the correct one.   Has the prescription been filled recently? No  Is the patient out of the medication? Yes  Has the patient been seen for an appointment in the last year OR does the patient have an upcoming appointment? Yes  Can we respond through MyChart? No, phone call preferred  Agent: Please be advised that Rx refills may take up to 3 business days. We ask that you follow-up with your pharmacy.

## 2024-02-27 NOTE — Telephone Encounter (Signed)
 Last OV: 11/04/23  Next OV: 03/09/24  Last filled: 01/16/24  Quantity: 30

## 2024-03-02 NOTE — Telephone Encounter (Signed)
 Please advise on refill.  Please and thank you!    Copied from CRM #[id number]. Topic: Clinical - Prescription Issue >> Mar 02, 2024 10:43 AM Terri MATSU wrote: Reason for CRM: Patient called on 1/15 to have her medication HYDROcodone -acetaminophen   refill and she stated her pharmacy hasn't received anything from us  and patient needs medication and she is all out. Can you please call patient when dr fills it. Callback number  [phone] >> Mar 02, 2024 11:04 AM Sophia H wrote: Patient is requesting medication be sent to  [facility] [address] [place] - [address]

## 2024-03-03 MED ORDER — HYDROCODONE-ACETAMINOPHEN 5-325 MG PO TABS: 1.0000 | ORAL_TABLET | Freq: Every day | ORAL | 0 refills | Status: AC | PRN

## 2024-03-04 ENCOUNTER — Ambulatory Visit: Admitting: Radiation Oncology

## 2024-03-04 ENCOUNTER — Encounter: Payer: Self-pay | Admitting: Radiation Oncology

## 2024-03-04 ENCOUNTER — Ambulatory Visit
Admission: RE | Admit: 2024-03-04 | Discharge: 2024-03-04 | Disposition: A | Discharge status: Home / Self Care | Source: Ambulatory Visit | Attending: Radiation Oncology | Admitting: Radiation Oncology

## 2024-03-04 VITALS — BP 130/74 | HR 65 | Temp 98.5°F | Ht 60.43 in | Wt 147.3 lb

## 2024-03-04 DIAGNOSIS — Z8673 Personal history of transient ischemic attack (TIA), and cerebral infarction without residual deficits: Secondary | ICD-10-CM | POA: Diagnosis not present

## 2024-03-04 DIAGNOSIS — Z7982 Long term (current) use of aspirin: Secondary | ICD-10-CM | POA: Diagnosis not present

## 2024-03-04 DIAGNOSIS — I2510 Atherosclerotic heart disease of native coronary artery without angina pectoris: Secondary | ICD-10-CM | POA: Insufficient documentation

## 2024-03-04 DIAGNOSIS — Z800 Family history of malignant neoplasm of digestive organs: Secondary | ICD-10-CM | POA: Insufficient documentation

## 2024-03-04 DIAGNOSIS — C50412 Malignant neoplasm of upper-outer quadrant of left female breast: Secondary | ICD-10-CM | POA: Diagnosis present

## 2024-03-04 DIAGNOSIS — I129 Hypertensive chronic kidney disease with stage 1 through stage 4 chronic kidney disease, or unspecified chronic kidney disease: Secondary | ICD-10-CM | POA: Diagnosis not present

## 2024-03-04 DIAGNOSIS — M1990 Unspecified osteoarthritis, unspecified site: Secondary | ICD-10-CM | POA: Insufficient documentation

## 2024-03-04 DIAGNOSIS — R011 Cardiac murmur, unspecified: Secondary | ICD-10-CM | POA: Insufficient documentation

## 2024-03-04 DIAGNOSIS — E785 Hyperlipidemia, unspecified: Secondary | ICD-10-CM | POA: Diagnosis not present

## 2024-03-04 DIAGNOSIS — E1122 Type 2 diabetes mellitus with diabetic chronic kidney disease: Secondary | ICD-10-CM | POA: Insufficient documentation

## 2024-03-04 DIAGNOSIS — E7800 Pure hypercholesterolemia, unspecified: Secondary | ICD-10-CM | POA: Diagnosis not present

## 2024-03-04 DIAGNOSIS — Z7984 Long term (current) use of oral hypoglycemic drugs: Secondary | ICD-10-CM | POA: Diagnosis not present

## 2024-03-04 DIAGNOSIS — K219 Gastro-esophageal reflux disease without esophagitis: Secondary | ICD-10-CM | POA: Diagnosis not present

## 2024-03-04 DIAGNOSIS — Z87891 Personal history of nicotine dependence: Secondary | ICD-10-CM | POA: Insufficient documentation

## 2024-03-04 DIAGNOSIS — Z8616 Personal history of COVID-19: Secondary | ICD-10-CM | POA: Diagnosis not present

## 2024-03-04 DIAGNOSIS — Z17421 Hormone receptor negative with human epidermal growth factor receptor 2 negative status: Secondary | ICD-10-CM | POA: Diagnosis not present

## 2024-03-04 DIAGNOSIS — I252 Old myocardial infarction: Secondary | ICD-10-CM | POA: Diagnosis not present

## 2024-03-04 DIAGNOSIS — N189 Chronic kidney disease, unspecified: Secondary | ICD-10-CM | POA: Insufficient documentation

## 2024-03-04 DIAGNOSIS — Z79899 Other long term (current) drug therapy: Secondary | ICD-10-CM | POA: Diagnosis not present

## 2024-03-04 NOTE — Progress Notes (Signed)
 " Radiation Oncology         [phone] ________________________________  Name: Joanne Morris        MRN: [id number]  Date of Service: 03/04/2024 DOB: 1950/10/14  RR:Yliwzoo, Corean, NP  Lucius Corean, NP     REFERRING PHYSICIAN: Odean Potts, MD  DIAGNOSIS: Invasive ductal carcinoma of the left breast, triple negative, status post lumpectomy and sentinel lymph node sampling, pT2N0   HISTORY OF PRESENT ILLNESS: Joanne Morris is a 74 y.o. female seen at the request of Dr. Odean.  She was initially diagnosed with invasive left breast carcinoma after an abnormality was found on screening mammogram performed last June.  Subsequent biopsy revealed invasive ductal carcinoma.  She went on to undergo left breast lumpectomy and sentinel lymph node sampling, with pathology revealing grade 3 invasive ductal carcinoma, measuring 2.9 cm in greatest dimension.  Surgical margins were clear.  Her carcinoma was triple negative, with a Ki-67 of 70%.  She was seen in consultation by Dr. Gudena, and went on to receive adjuvant chemotherapy with CMF.  She reports that she tolerated chemotherapy reasonably well, being bothered primarily by fatigue.  Consultation is requested regarding the potential role of radiation in her care.    PREVIOUS RADIATION THERAPY: No   PAST MEDICAL HISTORY:  Past Medical History:  Diagnosis Date   Abdominal pain 10/11/2012   Acute kidney injury superimposed on CKD 08/01/2018   Anemia 05/31/2011   Iron deficiency   Angina    Anxiety    Arthritis    knees (07/20/2013)   Atypical chest pain 05/31/2011   Cancer (HCC) 2025   Left Breast Cancer   Carotid stenosis, asymptomatic 04/18/2014   Claudication    Coronary artery disease 2005   Stent placed to RCA   COVID-19 virus infection 08/01/2018   Depression    Family history of colon cancer    GERD (gastroesophageal reflux disease)    Heart murmur    Hematoma of neck 04/23/2014   High cholesterol     Hyperlipemia 05/31/2011   Hypertension    Hypokalemia 05/31/2011   Leucocytosis 05/31/2011   Mass of upper outer quadrant of left breast 04/23/2023   Myocardial infarction [facility]) 1990's   1   Neck pain on right side 10/04/2014   PAD (peripheral artery disease)    Peripheral arterial disease 06/08/2013   Peripheral arterial disease   Radiculopathy of leg 10/11/2012   Stroke (HCC)    mini stroke 1st then regular stroke, denies residual on 07/20/2013   Type 2 diabetes mellitus with diabetic peripheral angiopathy without gangrene, without long-term current use of insulin  (HCC) 08/13/2023   Wears glasses        PAST SURGICAL HISTORY: Past Surgical History:  Procedure Laterality Date   ABDOMINAL HYSTERECTOMY     ATHERECTOMY  10/05/2013   BALLOON ANGIOPLASTY, ARTERY  10/05/2013   DR LADONA   BREAST BIOPSY Left 08/05/2023   US  LT BREAST BX W LOC DEV 1ST LESION IMG BX SPEC US  GUIDE 08/05/2023 [facility]   BREAST BIOPSY Left 09/23/2023   US  LT RADIOACTIVE SEED LOC 09/23/2023 [facility]AMMO[facility]   BREAST LUMPECTOMY WITH RADIOACTIVE SEED LOCALIZATION Left 09/25/2023   Procedure: BREAST LUMPECTOMY WITH RADIOACTIVE SEED LOCALIZATION;  Surgeon: Aron Shoulders, MD;  [facility]: [facility];  Service: General;  Laterality: Left;  LEFT BREAST SEED LOCALIZATION LUMPECTOMY   CAROTID ENDARTERECTOMY     CORONARY ANGIOPLASTY WITH STENT PLACEMENT     1   ENDARTERECTOMY Left 02/07/2014  Procedure: ENDARTERECTOMY CAROTID;  Surgeon: Carlin FORBES Haddock, MD;  Location: [facility] OR;  Service: Vascular;  Laterality: Left;   ENDARTERECTOMY Right 04/18/2014   Procedure: ENDARTERECTOMY RIGHT CAROTID;  Surgeon: Carlin FORBES Haddock, MD;  Location: [facility] OR;  Service: Vascular;  Laterality: Right;   ENDARTERECTOMY N/A 04/24/2014   Procedure: IRRIGATION AND DEBRIDEMENT OF RIGHT NECK ;  Surgeon: Carlin FORBES Haddock, MD;  Location: [facility] OR;  Service: Vascular;  Laterality: N/A;   ESOPHAGOGASTRODUODENOSCOPY N/A 10/13/2012   Procedure:  ESOPHAGOGASTRODUODENOSCOPY (EGD);  Surgeon: Lupita FORBES Commander, MD;  Location: [facility] ENDOSCOPY;  Service: Endoscopy;  Laterality: N/A;   KENALOG  INJECTION Bilateral 08/22/2016   Procedure: KENALOG  INJECTION BILATERAL NECK;  Surgeon: Lowery Estefana RAMAN, DO;  Location: [facility];  Service: Plastics;  Laterality: Bilateral;   LOWER EXTREMITY ANGIOGRAM  07/20/2013   Unsuccessful attempt at crossing the CTO/notes 07/20/2013   LOWER EXTREMITY ANGIOGRAM N/A 07/06/2013   Procedure: LOWER EXTREMITY ANGIOGRAM;  Surgeon: Erick JONELLE Bergamo, MD;  Location: [facility] CATH LAB;  Service: Cardiovascular;  Laterality: N/A;   LOWER EXTREMITY ANGIOGRAM N/A 07/20/2013   Procedure: LOWER EXTREMITY ANGIOGRAM;  Surgeon: Erick JONELLE Bergamo, MD;  Location: [facility] CATH LAB;  Service: Cardiovascular;  Laterality: N/A;   LOWER EXTREMITY ANGIOGRAM N/A 10/05/2013   Procedure: LOWER EXTREMITY ANGIOGRAM;  Surgeon: Erick JONELLE Bergamo, MD;  Location: [facility] CATH LAB;  Service: Cardiovascular;  Laterality: N/A;   MASS EXCISION Right 08/22/2016   Procedure: EXCISION RIGHT NECK KELOID;  Surgeon: Lowery Estefana RAMAN, DO;  Location: [facility];  Service: Plastics;  Laterality: Right;   MASS EXCISION Right 09/18/2020   Procedure: Excision of right neck keloid;  Surgeon: Lowery Estefana RAMAN, DO;  [facility] OR;  Service: Plastics;  Laterality: Right;   PATCH ANGIOPLASTY Left 02/07/2014   Procedure: PATCH ANGIOPLASTY Carotid;  Surgeon: Carlin FORBES Haddock, MD;  Location: [facility] OR;  Service: Vascular;  Laterality: Left;   PATCH ANGIOPLASTY Right 04/18/2014   Procedure: PATCH ANGIOPLASTY USING HEMASHIELD 0.8cmx 7.6cm PATCH;  Surgeon: Carlin FORBES Haddock, MD;  Location: [facility] OR;  Service: Vascular;  Laterality: Right;   PERIPHERAL VASCULAR CATHETERIZATION N/A 07/05/2014   Procedure: Lower Extremity Angiography;  Surgeon: Gordy Bergamo, MD;  Location: [facility] INVASIVE CV LAB;  Service: Cardiovascular;  Laterality: N/A;   PORTACATH PLACEMENT N/A 09/25/2023    Procedure: INSERTION, TUNNELED CENTRAL VENOUS DEVICE, WITH PORT;  Surgeon: Aron Shoulders, MD;  [facility] OR;  Service: General;  Laterality: N/A;   SENTINEL NODE BIOPSY N/A 09/25/2023   Procedure: BIOPSY, LYMPH NODE, SENTINEL;  Surgeon: Aron Shoulders, MD;  [facility] OR;  Service: General;  Laterality: N/A;   TUBAL LIGATION       FAMILY HISTORY:  Family History  Problem Relation Age of Onset   Cancer Mother        unknown form, d. < 50   Colon cancer Brother        d. > 50     SOCIAL HISTORY:  reports that she quit smoking about 14 years ago. Her smoking use included cigarettes. She started smoking about 18 years ago. She has a 2 pack-year smoking history. She has never used smokeless tobacco. She reports that she does not drink alcohol and does not use drugs.   ALLERGIES: Patient has no known allergies.   MEDICATIONS:  Current Outpatient Medications  Medication Sig Dispense Refill   acetaminophen  (TYLENOL ) 650 MG CR tablet Take 1 tablet (650 mg total) by mouth every evening. FOR PAIN. (Patient  taking differently: Take 650 mg by mouth every 8 (eight) hours as needed for pain. FOR PAIN.) 30 tablet 11   amLODipine  (NORVASC ) 10 MG tablet TAKE ONE TABLET BY MOUTH DAILY AT 9AM 90 tablet 11   aspirin  EC 81 MG tablet Take 81 mg by mouth daily in the afternoon.     busPIRone  (BUSPAR ) 5 MG tablet TAKE ONE TABLET (5MG  TOTAL) BY MOUTH TWICE DAILY 180 tablet 4   calcium  carbonate (OSCAL) 1500 (600 Ca) MG TABS tablet Take 600 mg by mouth in the morning.     Cholecalciferol (VITAMIN D3) 125 MCG (5000 UT) TABS Take 5,000 Units by mouth daily.     cilostazol  (PLETAL ) 100 MG tablet TAKE ONE TABLET (100 MG TOTAL) BY MOUTH TWICE DAILY 180 tablet 11   diphenoxylate -atropine  (LOMOTIL ) 2.5-0.025 MG tablet Take 1 tablet by mouth 4 (four) times daily as needed for diarrhea or loose stools (Max dose of 8 tabs in 24 hours). 30 tablet 1   FARXIGA  10 MG TABS tablet TAKE ONE TABLET (10MG  TOTAL) BY MOUTH IN  THE MORNING DAILY AT 9AM 90 tablet 11   glipiZIDE  (GLUCOTROL  XL) 5 MG 24 hr tablet TAKE ONE TABLET (5 MG TOTAL) BY MOUTH DAILY WITH BREAKFAST 90 tablet 11   HYDROcodone -acetaminophen  (NORCO/VICODIN) 5-325 MG tablet Take 1 tablet by mouth daily as needed for moderate pain (pain score 4-6) or severe pain (pain score 7-10). 30 tablet 0   levocetirizine (XYZAL ) 5 MG tablet TAKE ONE TABLET (5 MG TOTAL) BY MOUTH DAILY AS NEEDED FOR ALLERGIES 90 tablet 11   lidocaine -prilocaine  (EMLA ) cream Apply to affected area once (Patient not taking: Reported on 03/03/2024) 30 g 3   LINZESS  145 MCG CAPS capsule TAKE ONE CAPSULE ( TOTAL) BY MOUTH EVERY DAY (Patient taking differently: Take 145 mcg by mouth daily as needed (Constipation).) 90 capsule 11   metoprolol  tartrate (LOPRESSOR ) 100 MG tablet TAKE ONE TABLET BY MOUTH TWICE DAILY AT 9AM & 5PM 180 tablet 11   niacin  (VITAMIN B3) 500 MG ER tablet TAKE ONE TABLET (500 MG TOTAL) BY MOUTH DAILY (Patient taking differently: Take 500 mg by mouth at bedtime.) 90 tablet 11   ondansetron  (ZOFRAN ) 8 MG tablet Take 1 tablet (8 mg total) by mouth every 8 (eight) hours as needed for nausea or vomiting. Start on the third day after chemotherapy. 30 tablet 1   pravastatin  (PRAVACHOL ) 40 MG tablet TAKE ONE TABLET BY MOUTH DAILY AT 5PM 90 tablet 11   prochlorperazine  (COMPAZINE ) 10 MG tablet Take 1 tablet (10 mg total) by mouth every 6 (six) hours as needed for nausea or vomiting. 30 tablet 1   sertraline  (ZOLOFT ) 100 MG tablet TAKE ONE TABLET BY MOUTH DAILY AT 9AM IN ADDITION TO 50 MG TABLET 90 tablet 11   sertraline  (ZOLOFT ) 50 MG tablet TAKE ONE TABLET BY MOUTH DAILY AT 5PM EVERY EVENING 90 tablet 11   valsartan  (DIOVAN ) 320 MG tablet TAKE ONE TABLET BY MOUTH DAILY AT 9AM 100 tablet 11   zolpidem  (AMBIEN  CR) 12.5 MG CR tablet Take 1 tablet (12.5 mg total) by mouth at bedtime. 30 tablet 5   No current facility-administered medications for this encounter.     REVIEW OF  SYSTEMS: On review of systems, the patient reports that she is doing well overall.  She denies any chest pain, shortness of breath, cough, fevers, chills, night sweats, unintended weight changes.  She denies any bowel or bladder disturbances, and denies abdominal pain, nausea or vomiting.  She denies any new musculoskeletal or joint aches or pains.  She reports no pain in either breast.  She reports no nipple bleeding or discharge.  A complete review of systems is obtained and is otherwise negative.     PHYSICAL EXAM:  Wt Readings from Last 3 Encounters:  03/04/24 147 lb 4.8 oz (66.8 kg)  02/04/24 144 lb 8 oz (65.5 kg)  01/16/24 145 lb 3.2 oz (65.9 kg)   Temp Readings from Last 3 Encounters:  03/04/24 98.5 F (36.9 C) (Oral)  02/04/24 (!) 97.4 F (36.3 C) (Tympanic)  01/16/24 97.9 F (36.6 C) (Temporal)   BP Readings from Last 3 Encounters:  03/04/24 130/74  02/04/24 (!) 156/71  01/16/24 136/66   Pulse Readings from Last 3 Encounters:  03/04/24 65  02/04/24 (!) 59  01/16/24 70   Pain Assessment Pain Score: 0-No pain/10  In general this is a well appearing female in no acute distress.  She's alert and oriented x4 and appropriate throughout the examination. Cardiopulmonary assessment is negative for acute distress and she exhibits normal effort.  No cervical, supraclavicular, or axillary lymphadenopathy is appreciated.  Examination of her left breast reveals her well-healed lumpectomy incision.  No tenderness to palpation is appreciated.    ECOG = 0  0 - Asymptomatic (Fully active, able to carry on all predisease activities without restriction)  1 - Symptomatic but completely ambulatory (Restricted in physically strenuous activity but ambulatory and able to carry out work of a light or sedentary nature. For example, light housework, office work)  2 - Symptomatic, <50% in bed during the day (Ambulatory and capable of all self care but unable to carry out any work activities. Up  and about more than 50% of waking hours)  3 - Symptomatic, >50% in bed, but not bedbound (Capable of only limited self-care, confined to bed or chair 50% or more of waking hours)  4 - Bedbound (Completely disabled. Cannot carry on any self-care. Totally confined to bed or chair)  5 - Death   Raylene MM, Creech RH, Tormey DC, et al. [phone]). Toxicity and response criteria of the [facility]. Am. DOROTHA Bridges. Oncol. 5 (6): 649-55    LABORATORY DATA:  Lab Results  Component Value Date   WBC 6.4 02/04/2024   HGB 12.7 02/04/2024   HCT 39.9 02/04/2024   MCV 78.2 (L) 02/04/2024   PLT 346 02/04/2024   Lab Results  Component Value Date   NA 140 02/04/2024   K 4.0 02/04/2024   CL 103 02/04/2024   CO2 26 02/04/2024   Lab Results  Component Value Date   ALT 20 02/04/2024   AST 26 02/04/2024   ALKPHOS 87 02/04/2024   BILITOT 0.3 02/04/2024        IMPRESSION/PLAN: 1.  The patient is a 74 year old female status post left breast lumpectomy and sentinel lymph node sampling, as well as adjuvant systemic chemotherapy, for a pT2pN0 triple negative invasive ductal carcinoma.  I spoke with the patient and her daughter at length regarding the role of adjuvant radiation in the management of invasive ductal carcinoma of the breast.  I reviewed the rationale behind external beam radiation in her clinical scenario, as well as side effects.  I explained that these may include, but are not limited to, skin irritation, and fatigue.  I also discussed potential long-term side effects of radiation, which I explained may include, but are not limited to, chronic injury to her left lung, and heart.  She  expressed understanding, and is agreeable to proceed.  We will make arrangements for her to return to our department for simulation and treatment planning.  In a visit lasting 60 minutes, greater than 50% of the time was spent face to face discussing the patient's condition, in preparation for  the discussion, and coordinating the patient's care.    JOMARIE LYNWOOD LABOR., MD    **Disclaimer: This note was dictated with voice recognition software. Similar sounding words can inadvertently be transcribed and this note may contain transcription errors which may not have been corrected upon publication of note.**  "

## 2024-03-05 ENCOUNTER — Ambulatory Visit: Admitting: Family

## 2024-03-08 ENCOUNTER — Encounter (HOSPITAL_COMMUNITY): Payer: Self-pay | Admitting: General Surgery

## 2024-03-08 ENCOUNTER — Other Ambulatory Visit: Payer: Self-pay

## 2024-03-08 NOTE — Progress Notes (Addendum)
 SDW call  Patient's daughter Lyndon was given pre-op instructions over the phone. She verbalized understanding of instructions provided. Denied any SOB, fever or cough   PCP - Corean Comment, NP Cardiologist - Leotis Collar, [facility] Pulmonary:  Oncology: Dr. Mackey Chad   PPM/ICD - denies Device Orders - na Rep Notified - na   Chest x-ray - 8/14/20205 EKG -  09/19/2023 Stress Test - 06/08/2023 ECHO - 10/14/2023 Cardiac Cath -  PCI: doesn't know the date  Sleep Study/sleep apnea/CPAP: denies  Type II diabetic.  A1C 6.5 on 09/19/2023 Fasting Blood sugar range: daughter unsure How often check sugars: daughter unsure Farxiga , hold 72 hrs Glipizide , hold DOS   Blood Thinner Instructions: Pletal , per surgeon's instructions, none were given at this time Aspirin  Instructions:ASA, per surgeon's instructions, none were given at this time.   ERAS Protcol - Clears until 0910  Anesthesia review: Yes. DM, CAD, HTN, MI, stroke, PVD, heart murmur  Your procedure is scheduled on Thursday March 11, 2024  Report to [facility] Main Entrance A at  0940  A.M., then check in with the Admitting office.  Call this number if you have problems the morning of surgery:  [phone]   If you have any questions prior to your surgery date call [phone]: Open Monday-Friday 8am-4pm If you experience any cold or flu symptoms such as cough, fever, chills, shortness of breath, etc. between now and your scheduled surgery, please notify us  at the above number    Remember:  Do not eat after midnight the night before your surgery  You may drink clear liquids until  0910   the morning of your surgery.   Clear liquids allowed are: Water, Non-Citrus Juices (without pulp), Carbonated Beverages, Clear Tea, Black Coffee ONLY (NO MILK, CREAM OR POWDERED CREAMER of any kind), and Gatorade   Take these medicines the morning of surgery with A SIP OF WATER:  Amlodipine , buspar , metoprolol , pravastatin ,  zoloft   As needed: Tylenol , lomotil , norco, xyzal , zofrran, compazine   As of today, STOP taking any Aleve, Naproxen, Ibuprofen, Motrin, Advil, Goody's, BC's, all herbal medications, fish oil, and all vitamins.

## 2024-03-09 ENCOUNTER — Ambulatory Visit: Admitting: Family

## 2024-03-09 ENCOUNTER — Other Ambulatory Visit: Payer: Self-pay | Admitting: Family

## 2024-03-09 DIAGNOSIS — R197 Diarrhea, unspecified: Secondary | ICD-10-CM

## 2024-03-09 NOTE — H&P (Signed)
 Joanne Morris is an 74 y.o. female.   Chief Complaint: port in place HPI: Pt is s/p comprehensive treatment of left breast cancer, triple negative.  She had breast conserving surgery with SLN bx 09/25/2023 and this was followed by chemo.  She is getting set up for radiation and desires port removal.    Past Medical History:  Diagnosis Date   Abdominal pain 10/11/2012   Acute kidney injury superimposed on CKD 08/01/2018   Anemia 05/31/2011   Iron deficiency   Angina    Anxiety    Arthritis    knees (07/20/2013)   Atypical chest pain 05/31/2011   Cancer (HCC) 2025   Left Breast Cancer   Carotid stenosis, asymptomatic 04/18/2014   Claudication    Coronary artery disease 2005   Stent placed to RCA   COVID-19 virus infection 08/01/2018   Depression    Family history of colon cancer    GERD (gastroesophageal reflux disease)    Heart murmur    Hematoma of neck 04/23/2014   High cholesterol    Hyperlipemia 05/31/2011   Hypertension    Hypokalemia 05/31/2011   Leucocytosis 05/31/2011   Mass of upper outer quadrant of left breast 04/23/2023   Myocardial infarction [facility]) 1990's   1   Neck pain on right side 10/04/2014   PAD (peripheral artery disease)    Peripheral arterial disease 06/08/2013   Peripheral arterial disease   Radiculopathy of leg 10/11/2012   Stroke (HCC)    mini stroke 1st then regular stroke, denies residual on 07/20/2013   Type 2 diabetes mellitus with diabetic peripheral angiopathy without gangrene, without long-term current use of insulin  (HCC) 08/13/2023   Wears glasses     Past Surgical History:  Procedure Laterality Date   ABDOMINAL HYSTERECTOMY     ATHERECTOMY  10/05/2013   BALLOON ANGIOPLASTY, ARTERY  10/05/2013   DR LADONA   BREAST BIOPSY Left 08/05/2023   US  LT BREAST BX W LOC DEV 1ST LESION IMG BX SPEC US  GUIDE 08/05/2023 [facility] MAMMOGRAPHY   BREAST BIOPSY Left 09/23/2023   US  LT RADIOACTIVE SEED LOC 09/23/2023 [facility] MAMMOGRAPHY   BREAST LUMPECTOMY  WITH RADIOACTIVE SEED LOCALIZATION Left 09/25/2023   Procedure: BREAST LUMPECTOMY WITH RADIOACTIVE SEED LOCALIZATION;  Surgeon: Aron Shoulders, MD;  Location: [facility] OR;  Service: General;  Laterality: Left;  LEFT BREAST SEED LOCALIZATION LUMPECTOMY   CAROTID ENDARTERECTOMY     CORONARY ANGIOPLASTY WITH STENT PLACEMENT     1   ENDARTERECTOMY Left 02/07/2014   Procedure: ENDARTERECTOMY CAROTID;  Surgeon: Carlin FORBES Haddock, MD;  Location: [facility] OR;  Service: Vascular;  Laterality: Left;   ENDARTERECTOMY Right 04/18/2014   Procedure: ENDARTERECTOMY RIGHT CAROTID;  Surgeon: Carlin FORBES Haddock, MD;  Location: [facility] OR;  Service: Vascular;  Laterality: Right;   ENDARTERECTOMY N/A 04/24/2014   Procedure: IRRIGATION AND DEBRIDEMENT OF RIGHT NECK ;  Surgeon: Carlin FORBES Haddock, MD;  Location: [facility] OR;  Service: Vascular;  Laterality: N/A;   ESOPHAGOGASTRODUODENOSCOPY N/A 10/13/2012   Procedure: ESOPHAGOGASTRODUODENOSCOPY (EGD);  Surgeon: Lupita FORBES Commander, MD;  Location: [facility] ENDOSCOPY;  Service: Endoscopy;  Laterality: N/A;   KENALOG  INJECTION Bilateral 08/22/2016   Procedure: KENALOG  INJECTION BILATERAL NECK;  Surgeon: Lowery Estefana RAMAN, DO;  Location: [facility];  Service: Plastics;  Laterality: Bilateral;   LOWER EXTREMITY ANGIOGRAM  07/20/2013   Unsuccessful attempt at crossing the CTO/notes 07/20/2013   LOWER EXTREMITY ANGIOGRAM N/A 07/06/2013   Procedure: LOWER EXTREMITY ANGIOGRAM;  Surgeon: Erick JONELLE Ladona, MD;  Location: [facility] CATH LAB;  Service: Cardiovascular;  Laterality: N/A;   LOWER EXTREMITY ANGIOGRAM N/A 07/20/2013   Procedure: LOWER EXTREMITY ANGIOGRAM;  Surgeon: Erick JONELLE Bergamo, MD;  Location: [facility] CATH LAB;  Service: Cardiovascular;  Laterality: N/A;   LOWER EXTREMITY ANGIOGRAM N/A 10/05/2013   Procedure: LOWER EXTREMITY ANGIOGRAM;  Surgeon: Erick JONELLE Bergamo, MD;  Location: [facility] CATH LAB;  Service: Cardiovascular;  Laterality: N/A;   MASS EXCISION Right 08/22/2016   Procedure: EXCISION RIGHT NECK  KELOID;  Surgeon: Lowery Estefana RAMAN, DO;  Location: [facility];  Service: Plastics;  Laterality: Right;   MASS EXCISION Right 09/18/2020   Procedure: Excision of right neck keloid;  Surgeon: Lowery Estefana RAMAN, DO;  Location: [facility] OR;  Service: Plastics;  Laterality: Right;   PATCH ANGIOPLASTY Left 02/07/2014   Procedure: PATCH ANGIOPLASTY Carotid;  Surgeon: Carlin FORBES Haddock, MD;  Location: [facility] OR;  Service: Vascular;  Laterality: Left;   PATCH ANGIOPLASTY Right 04/18/2014   Procedure: PATCH ANGIOPLASTY USING HEMASHIELD 0.8cmx 7.6cm PATCH;  Surgeon: Carlin FORBES Haddock, MD;  Location: [facility] OR;  Service: Vascular;  Laterality: Right;   PERIPHERAL VASCULAR CATHETERIZATION N/A 07/05/2014   Procedure: Lower Extremity Angiography;  Surgeon: Gordy Bergamo, MD;  Location: [facility] INVASIVE CV LAB;  Service: Cardiovascular;  Laterality: N/A;   PORTACATH PLACEMENT N/A 09/25/2023   Procedure: INSERTION, TUNNELED CENTRAL VENOUS DEVICE, WITH PORT;  Surgeon: Aron Shoulders, MD;  Location: [facility] OR;  Service: General;  Laterality: N/A;   SENTINEL NODE BIOPSY N/A 09/25/2023   Procedure: BIOPSY, LYMPH NODE, SENTINEL;  Surgeon: Aron Shoulders, MD;  Location: [facility] OR;  Service: General;  Laterality: N/A;   TUBAL LIGATION      Family History  Problem Relation Age of Onset   Cancer Mother        unknown form, d. < 50   Colon cancer Brother        d. > 50   Social History:  reports that she quit smoking about 14 years ago. Her smoking use included cigarettes. She started smoking about 18 years ago. She has a 2 pack-year smoking history. She has never used smokeless tobacco. She reports that she does not drink alcohol and does not use drugs.  Allergies: Allergies[1]  No medications prior to admission.    No results found for this or any previous visit (from the past 48 hours). No results found.  Review of Systems  All other systems reviewed and are negative.   Height 5' (1.524 m), weight 65.3 kg. Physical  Exam Constitutional:      Appearance: Normal appearance.  HENT:     Head: Normocephalic and atraumatic.  Eyes:     General: No scleral icterus.    Extraocular Movements: Extraocular movements intact.     Pupils: Pupils are equal, round, and reactive to light.  Cardiovascular:     Rate and Rhythm: Normal rate.  Pulmonary:     Comments: Left port in place.   Abdominal:     Palpations: Abdomen is soft.  Musculoskeletal:        General: Normal range of motion.     Cervical back: Neck supple.  Skin:    General: Skin is warm.     Capillary Refill: Capillary refill takes 2 to 3 seconds.  Neurological:     General: No focal deficit present.     Mental Status: She is alert and oriented to person, place, and time.  Psychiatric:        Mood and  Affect: Mood normal.        Behavior: Behavior normal.        Thought Content: Thought content normal.        Judgment: Judgment normal.      Assessment/Plan Left triple negative breast cancer.   S/p breast conserving surgery and chemotherapy Port in place  Discussed port removal and risks Patient wishes to proceed.   Jina LITTIE Nephew, MD, FACS, FSSO Surgical Oncology, General Surgery, Trauma and Critical [facility], [place] [phone] for weekday/non holidays Check [email or web address] for coverage night/weekend/holidays under General Surgery     03/09/2024, 11:19 AM       [1] No Known Allergies

## 2024-03-09 NOTE — Anesthesia Preprocedure Evaluation (Signed)
"                                    Anesthesia Evaluation  Patient identified by MRN, date of birth, ID band Patient awake    Reviewed: Allergy & Precautions, NPO status , Patient's Chart, lab work & pertinent test results, reviewed documented beta blocker date and time   Airway Mallampati: II  TM Distance: >3 FB Neck ROM: Full    Dental  (+) Dental Advisory Given, Edentulous Upper, Partial Lower, Missing   Pulmonary former smoker   Pulmonary exam normal breath sounds clear to auscultation       Cardiovascular hypertension, Pt. on medications and Pt. on home beta blockers + CAD, + Past MI, + Cardiac Stents (RCA) and + Peripheral Vascular Disease (s/p R CEA)  Normal cardiovascular exam Rhythm:Regular Rate:Normal     Neuro/Psych  PSYCHIATRIC DISORDERS Anxiety Depression     Neuromuscular disease CVA    GI/Hepatic Neg liver ROS,GERD  ,,  Endo/Other  diabetes, Type 2    Renal/GU negative Renal ROS     Musculoskeletal  (+) Arthritis ,    Abdominal   Peds  Hematology negative hematology ROS (+)   Anesthesia Other Findings Day of surgery medications reviewed with the patient.  Reproductive/Obstetrics                              Anesthesia Physical Anesthesia Plan  ASA: 3  Anesthesia Plan: MAC   Post-op Pain Management: Minimal or no pain anticipated and Tylenol  PO (pre-op)*   Induction: Intravenous  PONV Risk Score and Plan: 2 and TIVA and Treatment may vary due to age or medical condition  Airway Management Planned: Natural Airway and Simple Face Mask  Additional Equipment:   Intra-op Plan:   Post-operative Plan:   Informed Consent: I have reviewed the patients History and Physical, chart, labs and discussed the procedure including the risks, benefits and alternatives for the proposed anesthesia with the patient or authorized representative who has indicated his/her understanding and acceptance.     Dental  advisory given  Plan Discussed with: CRNA  Anesthesia Plan Comments: (PAT note written 03/09/2024 by Ying [place], PA-C.  )         Anesthesia Quick Evaluation  "

## 2024-03-09 NOTE — Progress Notes (Signed)
 Anesthesia Chart Review: Joanne Morris  Case: [id number] Date/Time: 03/11/24 1154   Procedure: REMOVAL PORT-A-CATH   Anesthesia type: Monitor Anesthesia Care   Pre-op diagnosis: PORT IN PLACE   [facility]   Surgeons: Aron Shoulders, MD       DISCUSSION: Patient is a 74 year old female scheduled for the above procedure. She completed chemotherapy for left breast cancer about a month ago.    History includes former smoker (quit 05/30/2009), HTN, CAD/angina (MI 1990's; s/p RCA stent ~ 2008 [place]), DM2, PAD (with claudication, unsuccessful attempt right peroneal artery angioplasty 07/20/13, s/p right tibioperoneal trunk atherectomy/angioplasty 10/05/2013), CVA/TIA, carotid artery disease (left carotid endarterectomy 01/19/2014; right CEA 04/18/2014, s/p incision & drainage of a seroma 04/24/2014), GERD, keloid (s/p right neck keloid excision 08/22/2016, 09/18/2020), left breast cancer (08/2023, s/p left breast lumpectomy 09/25/2023, s/p chemotherapy; left SCV port 09/25/2023).   Last cardiology visit Maximo Leach, NP was on 09/05/2023. CAD stable without chest pain. Mild MR in 2020 TTE. On statin therapy for dyslipidemia per primary care. BP acceptable. No additional cardiac testing advised prior to her August 2025 lumpectomy, although a routine follow-up echo was planned before her one year visit. 10/14/2023 TTE showed LVEF 60-65%, mild MR.   ASA and Pletal  instructions per Dr. Aron.    A1c 6.5% 09/19/2023. On Farxiga , glipizide . Advised to hold Fargixa for 3 days prior to surgery.     Anesthesia team to evaluate on the day of surgery.  VS: Ht 5' (1.524 m)   Wt 65.3 kg   BMI 28.12 kg/m  BP Readings from Last 3 Encounters:  03/04/24 130/74  02/04/24 (!) 156/71  01/16/24 136/66   Pulse Readings from Last 3 Encounters:  03/04/24 65  02/04/24 (!) 59  01/16/24 70    PROVIDERS: Lucius Krabbe, NP is PCP  Raylene Ned, MD is Cardiologist ([facility]) [name] Bruckner, MD is vascular surgeon, last visit 03/11/23 with one year follow-up planned. Odean Potts, MD is HEM-ON[facility] Jomarie Agent, MD is RAD-ONC     LABS: For day of surgery as indicated. Most recent results in [facility] include: Lab Results  Component Value Date   WBC 6.4 02/04/2024   HGB 12.7 02/04/2024   HCT 39.9 02/04/2024   PLT 346 02/04/2024   GLUCOSE 128 (H) 02/04/2024   ALT 20 02/04/2024   AST 26 02/04/2024   NA 140 02/04/2024   K 4.0 02/04/2024   CL 103 02/04/2024   CREATININE 1.06 (H) 02/04/2024   BUN 17 02/04/2024   CO2 26 02/04/2024   HGBA1C 6.5 (H) 09/19/2023    IMAGES: 1V PCXR 09/25/2023: IMPRESSION: 1. Left-sided Port-A-Cath tip overlies the superior cavoatrial junction. Otherwise, no acute cardiopulmonary findings. 2. Cardiomegaly.    EKG: 09/19/2023: Sinus bradycardia at 57 bpm Nonspecific T wave abnormality consdier ischemia Since last tracing rate slower Confirmed by Okey Moccasin ([id number]) on 09/19/2023 10:51:42 PM     CV:  TTE 10/14/2023 ([facility]): SUMMARY  Left ventricular systolic function is normal.  LV ejection fraction = 60-65%.  There is aortic valve sclerosis.  There is no aortic stenosis.  There is mild mitral regurgitation.  There is no comparison study available.    Carotid US  12/03/2022: Summary:  - Right Carotid: There is no evidence of stenosis in the right ICA.  - Left Carotid: There is no evidence of stenosis in the left ICA.  - Vertebrals:  Bilateral vertebral arteries demonstrate antegrade flow.  - Subclavians: Normal flow hemodynamics  were seen in bilateral subclavian  arteries.        Nuclear stress test 06/07/2013 [facility] CV; scanned under Media tab, Correspondence Enc 02/07/2014): Resting EKG NSR, poor r wave progression. Non-diagnostic stress EKG. No ST/T changes of ischemia noted with pharmacologic stress testing. Stress symptoms included being winded. Stress terminated due to completion of protocol. The perfusion study  demonstrated normal isotope uptake both at rest and stress. There was no evidence of ischemia or scar.  Dynamic gated images reveal normal wall motion and endocardial thickening. LVEF estimated at 73%.   Cardiac cath 02/01/2010 ([facility]; scanned under Media tab, Correspondence Enc 02/07/14):):  Summary: RCA stent patent with mild in-stent restenosis (40% distal RCA, 35% proximal RCA).  Other vessels have non-obstructive disease as before, LAD is slightly worse (35% mid LAD). LCx normal. LV gram shows mild inferobasal hypokinesis.  Recommendation: Medical therapy.   Past Medical History:  Diagnosis Date   Abdominal pain 10/11/2012   Acute kidney injury superimposed on CKD 08/01/2018   Anemia 05/31/2011   Iron deficiency   Angina    Anxiety    Arthritis    knees (07/20/2013)   Atypical chest pain 05/31/2011   Cancer (HCC) 2025   Left Breast Cancer   Carotid stenosis, asymptomatic 04/18/2014   Claudication    Coronary artery disease 2005   Stent placed to RCA   COVID-19 virus infection 08/01/2018   Depression    Family history of colon cancer    GERD (gastroesophageal reflux disease)    Heart murmur    Hematoma of neck 04/23/2014   High cholesterol    Hyperlipemia 05/31/2011   Hypertension    Hypokalemia 05/31/2011   Leucocytosis 05/31/2011   Mass of upper outer quadrant of left breast 04/23/2023   Myocardial infarction [facility]) 1990's   1   Neck pain on right side 10/04/2014   PAD (peripheral artery disease)    Peripheral arterial disease 06/08/2013   Peripheral arterial disease   Radiculopathy of leg 10/11/2012   Stroke (HCC)    mini stroke 1st then regular stroke, denies residual on 07/20/2013   Type 2 diabetes mellitus with diabetic peripheral angiopathy without gangrene, without long-term current use of insulin  (HCC) 08/13/2023   Wears glasses     Past Surgical History:  Procedure Laterality Date   ABDOMINAL HYSTERECTOMY     ATHERECTOMY  10/05/2013   BALLOON  ANGIOPLASTY, ARTERY  10/05/2013   DR LADONA   BREAST BIOPSY Left 08/05/2023   US  LT BREAST BX W LOC DEV 1ST LESION IMG BX SPEC US  GUIDE 08/05/2023 [facility]   BREAST BIOPSY Left 09/23/2023   US  LT RADIOACTIVE SEED LOC 09/23/2023 [facility]   BREAST LUMPECTOMY WITH RADIOACTIVE SEED LOCALIZATION Left 09/25/2023   Procedure: BREAST LUMPECTOMY WITH RADIOACTIVE SEED LOCALIZATION;  Surgeon: Aron Shoulders, MD;  [facility];  Service: General;  Laterality: Left;  LEFT BREAST SEED LOCALIZATION LUMPECTOMY   CAROTID ENDARTERECTOMY     CORONARY ANGIOPLASTY WITH STENT PLACEMENT     1   ENDARTERECTOMY Left 02/07/2014   Procedure: ENDARTERECTOMY CAROTID;  Surgeon: Carlin FORBES Haddock, MD;  Location[facility] OR;  Service: Vascular;  Laterality: Left;   ENDARTERECTOMY Right 04/18/2014   Procedure: ENDARTERECTOMY RIGHT CAROTID;  Surgeon: Carlin FORBES Haddock, MD;  Location: [facility] OR;  Service: Vascular;  Laterality: Right;   ENDARTERECTOMY N/A 04/24/2014   Procedure: IRRIGATION AND DEBRIDEMENT OF RIGHT NECK ;  Surgeon: Carlin FORBES Haddock, MD;  Location: [facility] OR;  Service: Vascular;  Laterality:  N/A;   ESOPHAGOGASTRODUODENOSCOPY N/A 10/13/2012   Procedure: ESOPHAGOGASTRODUODENOSCOPY (EGD);  Surgeon: Lupita FORBES Commander, MD;  Location: [facility] ENDOSCOPY;  Service: Endoscopy;  Laterality: N/A;   KENALOG  INJECTION Bilateral 08/22/2016   Procedure: KENALOG  INJECTION BILATERAL NECK;  Surgeon: Lowery Estefana RAMAN, DO;  Location: [facility];  Service: Plastics;  Laterality: Bilateral;   LOWER EXTREMITY ANGIOGRAM  07/20/2013   Unsuccessful attempt at crossing the CTO/notes 07/20/2013   LOWER EXTREMITY ANGIOGRAM N/A 07/06/2013   Procedure: LOWER EXTREMITY ANGIOGRAM;  Surgeon: Erick JONELLE Bergamo, MD;  Location: [facility] CATH LAB;  Service: Cardiovascular;  Laterality: N/A;   LOWER EXTREMITY ANGIOGRAM N/A 07/20/2013   Procedure: LOWER EXTREMITY ANGIOGRAM;  Surgeon: Erick JONELLE Bergamo, MD;  Location: [facility] CATH LAB;  Service: Cardiovascular;   Laterality: N/A;   LOWER EXTREMITY ANGIOGRAM N/A 10/05/2013   Procedure: LOWER EXTREMITY ANGIOGRAM;  Surgeon: Erick JONELLE Bergamo, MD;  Location: [facility] CATH LAB;  Service: Cardiovascular;  Laterality: N/A;   MASS EXCISION Right 08/22/2016   Procedure: EXCISION RIGHT NECK KELOID;  Surgeon: Lowery Estefana RAMAN, DO;  Location: [facility];  Service: Plastics;  Laterality: Right;   MASS EXCISION Right 09/18/2020   Procedure: Excision of right neck keloid;  Surgeon: Lowery Estefana RAMAN, DO;  [facility];  Service: Plastics;  Laterality: Right;   PATCH ANGIOPLASTY Left 02/07/2014   Procedure: PATCH ANGIOPLASTY Carotid;  Surgeon: Carlin FORBES Haddock, MD;  Location[facility] OR;  Service: Vascular;  Laterality: Left;   PATCH ANGIOPLASTY Right 04/18/2014   Procedure: PATCH ANGIOPLASTY USING HEMASHIELD 0.8cmx 7.6cm PATCH;  Surgeon: Carlin FORBES Haddock, MD;  Location: [facility] OR;  Service: Vascular;  Laterality: Right;   PERIPHERAL VASCULAR CATHETERIZATION N/A 07/05/2014   Procedure: Lower Extremity Angiography;  Surgeon: Gordy Bergamo, MD;  Location: [facility] INVASIVE CV LAB;  Service: Cardiovascular;  Laterality: N/A;   PORTACATH PLACEMENT N/A 09/25/2023   Procedure: INSERTION, TUNNELED CENTRAL VENOUS DEVICE, WITH PORT;  Surgeon: Aron Shoulders, MD;  [facility];  Service: General;  Laterality: N/A;   SENTINEL NODE BIOPSY N/A 09/25/2023   Procedure: BIOPSY, LYMPH NODE, SENTINEL;  Surgeon: Aron Shoulders, MD;  [facility];  Service: General;  Laterality: N/A;   TUBAL LIGATION      MEDICATIONS:  acetaminophen  (TYLENOL ) 650 MG CR tablet   amLODipine  (NORVASC ) 10 MG tablet   aspirin  EC 81 MG tablet   busPIRone  (BUSPAR ) 5 MG tablet   calcium  carbonate (OSCAL) 1500 (600 Ca) MG TABS tablet   Cholecalciferol (VITAMIN D3) 125 MCG (5000 UT) TABS   cilostazol  (PLETAL ) 100 MG tablet   diphenoxylate -atropine  (LOMOTIL ) 2.5-0.025 MG tablet   FARXIGA  10 MG TABS tablet   glipiZIDE  (GLUCOTROL  XL) 5 MG 24 hr tablet    HYDROcodone -acetaminophen  (NORCO/VICODIN) 5-325 MG tablet   levocetirizine (XYZAL ) 5 MG tablet   LINZESS  145 MCG CAPS capsule   metoprolol  tartrate (LOPRESSOR ) 100 MG tablet   niacin  (VITAMIN B3) 500 MG ER tablet   ondansetron  (ZOFRAN ) 8 MG tablet   pravastatin  (PRAVACHOL ) 40 MG tablet   prochlorperazine  (COMPAZINE ) 10 MG tablet   sertraline  (ZOLOFT ) 100 MG tablet   sertraline  (ZOLOFT ) 50 MG tablet   valsartan  (DIOVAN ) 320 MG tablet   zolpidem  (AMBIEN  CR) 12.5 MG CR tablet   lidocaine -prilocaine  (EMLA ) cream    Isaiah Ruder, PA-C Surgical Short Stay/Anesthesiology [facility] Phone [phone] [facility] Phone [phone] 03/09/2024 11:05 PM

## 2024-03-09 NOTE — Telephone Encounter (Signed)
 Last OV: 11/04/23  Next OV: 08/17/24  Last Refill: 04/04/23  Dispensed: 30/0

## 2024-03-10 NOTE — Telephone Encounter (Signed)
 call and move up her appointment in July - that is too long - last seen in September and need to see every 4 months due to taking the Hydroocodone (controlled) - needs appt soon no later than February

## 2024-03-11 ENCOUNTER — Ambulatory Visit (HOSPITAL_COMMUNITY)
Admission: RE | Admit: 2024-03-11 | Discharge: 2024-03-11 | Disposition: A | Discharge status: Home / Self Care | Attending: General Surgery | Admitting: General Surgery

## 2024-03-11 ENCOUNTER — Encounter (HOSPITAL_COMMUNITY): Admission: RE | Disposition: A | Payer: Self-pay | Source: Home / Self Care | Attending: General Surgery

## 2024-03-11 ENCOUNTER — Ambulatory Visit (HOSPITAL_BASED_OUTPATIENT_CLINIC_OR_DEPARTMENT_OTHER): Admitting: Vascular Surgery

## 2024-03-11 ENCOUNTER — Encounter (HOSPITAL_COMMUNITY): Admitting: Vascular Surgery

## 2024-03-11 DIAGNOSIS — N189 Chronic kidney disease, unspecified: Secondary | ICD-10-CM | POA: Diagnosis not present

## 2024-03-11 DIAGNOSIS — Z7984 Long term (current) use of oral hypoglycemic drugs: Secondary | ICD-10-CM | POA: Diagnosis not present

## 2024-03-11 DIAGNOSIS — C50912 Malignant neoplasm of unspecified site of left female breast: Secondary | ICD-10-CM | POA: Insufficient documentation

## 2024-03-11 DIAGNOSIS — K219 Gastro-esophageal reflux disease without esophagitis: Secondary | ICD-10-CM | POA: Insufficient documentation

## 2024-03-11 DIAGNOSIS — E785 Hyperlipidemia, unspecified: Secondary | ICD-10-CM | POA: Insufficient documentation

## 2024-03-11 DIAGNOSIS — F419 Anxiety disorder, unspecified: Secondary | ICD-10-CM | POA: Diagnosis not present

## 2024-03-11 DIAGNOSIS — E1151 Type 2 diabetes mellitus with diabetic peripheral angiopathy without gangrene: Secondary | ICD-10-CM | POA: Diagnosis not present

## 2024-03-11 DIAGNOSIS — Z9221 Personal history of antineoplastic chemotherapy: Secondary | ICD-10-CM | POA: Insufficient documentation

## 2024-03-11 DIAGNOSIS — I252 Old myocardial infarction: Secondary | ICD-10-CM | POA: Diagnosis not present

## 2024-03-11 DIAGNOSIS — F32A Depression, unspecified: Secondary | ICD-10-CM | POA: Insufficient documentation

## 2024-03-11 DIAGNOSIS — Z8673 Personal history of transient ischemic attack (TIA), and cerebral infarction without residual deficits: Secondary | ICD-10-CM | POA: Diagnosis not present

## 2024-03-11 DIAGNOSIS — E1122 Type 2 diabetes mellitus with diabetic chronic kidney disease: Secondary | ICD-10-CM | POA: Diagnosis not present

## 2024-03-11 DIAGNOSIS — Z452 Encounter for adjustment and management of vascular access device: Secondary | ICD-10-CM | POA: Insufficient documentation

## 2024-03-11 DIAGNOSIS — G709 Myoneural disorder, unspecified: Secondary | ICD-10-CM | POA: Insufficient documentation

## 2024-03-11 DIAGNOSIS — I2510 Atherosclerotic heart disease of native coronary artery without angina pectoris: Secondary | ICD-10-CM | POA: Insufficient documentation

## 2024-03-11 DIAGNOSIS — I1310 Hypertensive heart and chronic kidney disease without heart failure, with stage 1 through stage 4 chronic kidney disease, or unspecified chronic kidney disease: Secondary | ICD-10-CM | POA: Insufficient documentation

## 2024-03-11 DIAGNOSIS — I10 Essential (primary) hypertension: Secondary | ICD-10-CM

## 2024-03-11 DIAGNOSIS — M1990 Unspecified osteoarthritis, unspecified site: Secondary | ICD-10-CM | POA: Insufficient documentation

## 2024-03-11 DIAGNOSIS — Z17421 Hormone receptor negative with human epidermal growth factor receptor 2 negative status: Secondary | ICD-10-CM | POA: Diagnosis not present

## 2024-03-11 DIAGNOSIS — Z955 Presence of coronary angioplasty implant and graft: Secondary | ICD-10-CM | POA: Insufficient documentation

## 2024-03-11 DIAGNOSIS — Z87891 Personal history of nicotine dependence: Secondary | ICD-10-CM | POA: Diagnosis not present

## 2024-03-11 DIAGNOSIS — Z79899 Other long term (current) drug therapy: Secondary | ICD-10-CM | POA: Insufficient documentation

## 2024-03-11 LAB — BASIC METABOLIC PANEL WITH GFR
Anion gap: 11 (ref 5–15)
BUN: 14 mg/dL (ref 8–23)
CO2: 25 mmol/L (ref 22–32)
Calcium: 9.7 mg/dL (ref 8.9–10.3)
Chloride: 105 mmol/L (ref 98–111)
Creatinine, Ser: 0.88 mg/dL (ref 0.44–1.00)
GFR, Estimated: >60 mL/min
Glucose, Bld: 141 mg/dL — ABNORMAL HIGH (ref 70–99)
Potassium: 3.8 mmol/L (ref 3.5–5.1)
Sodium: 141 mmol/L (ref 135–145)

## 2024-03-11 LAB — CBC
HCT: 38.7 % (ref 36.0–46.0)
Hemoglobin: 12.6 g/dL (ref 12.0–15.0)
MCH: 25.8 pg — ABNORMAL LOW (ref 26.0–34.0)
MCHC: 32.6 g/dL (ref 30.0–36.0)
MCV: 79.3 fL — ABNORMAL LOW (ref 80.0–100.0)
Platelets: 270 10*3/uL (ref 150–400)
RBC: 4.88 MIL/uL (ref 3.87–5.11)
RDW: 17.1 % — ABNORMAL HIGH (ref 11.5–15.5)
WBC: 9.1 10*3/uL (ref 4.0–10.5)
nRBC: 0 % (ref 0.0–0.2)

## 2024-03-11 LAB — GLUCOSE, CAPILLARY
Glucose-Capillary: 151 mg/dL — ABNORMAL HIGH (ref 70–99)
Glucose-Capillary: 117 mg/dL — ABNORMAL HIGH (ref 70–99)

## 2024-03-11 LAB — SURGICAL PCR SCREEN
MRSA, PCR: NEGATIVE
Staphylococcus aureus: NEGATIVE

## 2024-03-11 MED ORDER — ORAL CARE MOUTH RINSE: 15.0000 mL | Freq: Once | OROMUCOSAL | Status: AC

## 2024-03-11 MED ORDER — CHLORHEXIDINE GLUCONATE 0.12 % MT SOLN: 15.0000 mL | Freq: Once | OROMUCOSAL | Status: AC

## 2024-03-11 MED ORDER — PROPOFOL 10 MG/ML IV BOLUS
INTRAVENOUS | Status: AC
  Filled 2024-03-11: qty 20

## 2024-03-11 MED ORDER — ONDANSETRON HCL 4 MG/2ML IJ SOLN: 4.0000 mg | Freq: Once | INTRAMUSCULAR | Status: DC | PRN

## 2024-03-11 MED ORDER — LIDOCAINE HCL (PF) 1 % IJ SOLN
INTRAMUSCULAR | Status: AC
  Filled 2024-03-11: qty 30

## 2024-03-11 MED ORDER — CEFAZOLIN SODIUM-DEXTROSE 2-4 GM/100ML-% IV SOLN
2.0000 g | INTRAVENOUS | Status: AC
  Administered 2024-03-11: 2 g via INTRAVENOUS
  Filled 2024-03-11: qty 100

## 2024-03-11 MED ORDER — OXYCODONE HCL 5 MG PO TABS: 5.0000 mg | ORAL_TABLET | Freq: Four times a day (QID) | ORAL | 0 refills | Status: AC | PRN

## 2024-03-11 MED ORDER — ONDANSETRON HCL 4 MG/2ML IJ SOLN
INTRAMUSCULAR | Status: AC
  Filled 2024-03-11: qty 2

## 2024-03-11 MED ORDER — PROPOFOL 10 MG/ML IV BOLUS
INTRAVENOUS | Status: DC | PRN
  Administered 2024-03-11: 20 mg via INTRAVENOUS
  Administered 2024-03-11: 125 ug/kg/min via INTRAVENOUS

## 2024-03-11 MED ORDER — ACETAMINOPHEN 500 MG PO TABS
1000.0000 mg | ORAL_TABLET | ORAL | Status: AC
  Administered 2024-03-11: 1000 mg via ORAL
  Filled 2024-03-11: qty 2

## 2024-03-11 MED ORDER — ONDANSETRON HCL 4 MG/2ML IJ SOLN
INTRAMUSCULAR | Status: DC | PRN
  Administered 2024-03-11: 4 mg via INTRAVENOUS

## 2024-03-11 MED ORDER — PHENYLEPHRINE 80 MCG/ML (10ML) SYRINGE FOR IV PUSH (FOR BLOOD PRESSURE SUPPORT)
PREFILLED_SYRINGE | INTRAVENOUS | Status: DC | PRN
  Administered 2024-03-11 (×2): 80 ug via INTRAVENOUS

## 2024-03-11 MED ORDER — LACTATED RINGERS IV SOLN: INTRAVENOUS | Status: DC

## 2024-03-11 MED ORDER — LIDOCAINE HCL 1 % IJ SOLN
INTRAMUSCULAR | Status: DC | PRN
  Administered 2024-03-11: 10 mL

## 2024-03-11 MED ORDER — LIDOCAINE 2% (20 MG/ML) 5 ML SYRINGE
INTRAMUSCULAR | Status: AC
  Filled 2024-03-11: qty 5

## 2024-03-11 MED ORDER — CHLORHEXIDINE GLUCONATE 0.12 % MT SOLN
OROMUCOSAL | Status: AC
  Administered 2024-03-11: 15 mL via OROMUCOSAL
  Filled 2024-03-11: qty 15

## 2024-03-11 MED ORDER — FENTANYL CITRATE (PF) 100 MCG/2ML IJ SOLN
INTRAMUSCULAR | Status: DC | PRN
  Administered 2024-03-11: 25 ug via INTRAVENOUS
  Administered 2024-03-11: 50 ug via INTRAVENOUS
  Administered 2024-03-11: 25 ug via INTRAVENOUS

## 2024-03-11 MED ORDER — DEXAMETHASONE SOD PHOSPHATE PF 10 MG/ML IJ SOLN
INTRAMUSCULAR | Status: AC
  Filled 2024-03-11: qty 1

## 2024-03-11 MED ORDER — DEXAMETHASONE SOD PHOSPHATE PF 10 MG/ML IJ SOLN
INTRAMUSCULAR | Status: DC | PRN
  Administered 2024-03-11: 5 mg via INTRAVENOUS

## 2024-03-11 MED ORDER — BUPIVACAINE-EPINEPHRINE (PF) 0.25% -1:200000 IJ SOLN
INTRAMUSCULAR | Status: AC
  Filled 2024-03-11: qty 30

## 2024-03-11 MED ORDER — FENTANYL CITRATE (PF) 100 MCG/2ML IJ SOLN
INTRAMUSCULAR | Status: AC
  Filled 2024-03-11: qty 2

## 2024-03-11 MED ORDER — INSULIN ASPART 100 UNIT/ML IJ SOLN: 0.0000 [IU] | INTRAMUSCULAR | Status: DC | PRN

## 2024-03-11 MED ORDER — CHLORHEXIDINE GLUCONATE CLOTH 2 % EX PADS
6.0000 | MEDICATED_PAD | Freq: Once | CUTANEOUS | Status: AC
  Administered 2024-03-11: 6 via TOPICAL

## 2024-03-11 MED ORDER — CHLORHEXIDINE GLUCONATE CLOTH 2 % EX PADS: 6.0000 | MEDICATED_PAD | Freq: Once | CUTANEOUS | Status: DC

## 2024-03-11 MED ORDER — LIDOCAINE 2% (20 MG/ML) 5 ML SYRINGE
INTRAMUSCULAR | Status: DC | PRN
  Administered 2024-03-11: 30 mg via INTRAVENOUS

## 2024-03-11 MED ORDER — PROPOFOL 1000 MG/100ML IV EMUL
INTRAVENOUS | Status: AC
  Filled 2024-03-11: qty 100

## 2024-03-11 MED ORDER — FENTANYL CITRATE (PF) 100 MCG/2ML IJ SOLN: 25.0000 ug | INTRAMUSCULAR | Status: DC | PRN

## 2024-03-11 NOTE — Discharge Instructions (Addendum)
 [facility] Office Phone Number [phone]   POST OP INSTRUCTIONS  Always review your discharge instruction sheet given to you by the facility where your surgery was performed.  IF YOU HAVE DISABILITY OR FAMILY LEAVE FORMS, YOU MUST BRING THEM TO THE OFFICE FOR PROCESSING.  DO NOT GIVE THEM TO YOUR DOCTOR.  Take 2 tylenol (acetominophen) three times a day for 3 days.  If you still have pain, add ibuprofen with food in between if able to take this (if you have kidney issues or stomach issues, do not take ibuprofen).  If both of those are not enough, add the narcotic pain pill.  If you find you are needing a lot of this overnight after surgery, call the next morning for a refill.   Take your usually prescribed medications unless otherwise directed If you need a refill on your pain medication, please contact your pharmacy.  They will contact our office to request authorization.  Prescriptions will not be filled after 5pm or on week-ends. You should eat very light the first 24 hours after surgery, such as soup, crackers, pudding, etc.  Resume your normal diet the day after surgery It is common to experience some constipation if taking pain medication after surgery.  Increasing fluid intake and taking a stool softener will usually help or prevent this problem from occurring.  A mild laxative (Milk of Magnesia or Miralax) should be taken according to package directions if there are no bowel movements after 48 hours. You may shower in 48 hours.  The surgical glue will flake off in 2-3 weeks.   ACTIVITIES:  No strenuous activity or heavy lifting for 1 week.   You may drive when you no longer are taking prescription pain medication, you can comfortably wear a seatbelt, and you can safely maneuver your car and apply brakes. RETURN TO WORK:  __________n/a_______________ Bonita Quin should see your doctor in the office for a follow-up appointment approximately three-four weeks after your surgery.     WHEN TO CALL YOUR DOCTOR: Fever over 101.0 Nausea and/or vomiting. Extreme swelling or bruising. Continued bleeding from incision. Increased pain, redness, or drainage from the incision.  The clinic staff is available to answer your questions during regular business hours.  Please don't hesitate to call and ask to speak to one of the nurses for clinical concerns.  If you have a medical emergency, go to the nearest emergency room or call 911.  A surgeon from [facility] is always on call at the hospital.  For further questions, please visit [email or web address]

## 2024-03-11 NOTE — Anesthesia Postprocedure Evaluation (Signed)
"   Anesthesia Post Note  Patient: Joanne Morris  Procedure(s) Performed: REMOVAL PORT-A-CATH (Left: Chest)     Patient location during evaluation: PACU Anesthesia Type: MAC Level of consciousness: awake and alert Pain management: pain level controlled Vital Signs Assessment: post-procedure vital signs reviewed and stable Respiratory status: spontaneous breathing, nonlabored ventilation and respiratory function stable Cardiovascular status: stable and blood pressure returned to baseline Postop Assessment: no apparent nausea or vomiting Anesthetic complications: no   There were no known notable events for this encounter.  Last Vitals:  Vitals:   03/11/24 1315 03/11/24 1330  BP: (!) 149/61 (!) 141/67  Pulse: (!) 55 (!) 57  Resp: 17 12  Temp: 36.6 C 36.9 C  SpO2: 98% 99%    Last Pain:  Vitals:   03/11/24 1330  PainSc: 0-No pain                 Garnette FORBES Skillern      "

## 2024-03-11 NOTE — Transfer of Care (Signed)
 Immediate Anesthesia Transfer of Care Note  Patient: Joanne Morris  Procedure(s) Performed: REMOVAL PORT-A-CATH (Left: Chest)  Patient Location: PACU  Anesthesia Type:MAC  Level of Consciousness: awake, alert , oriented, and patient cooperative  Airway & Oxygen Therapy: Patient Spontanous Breathing and Patient connected to face mask oxygen  Post-op Assessment: Report given to RN and Post -op Vital signs reviewed and stable  Post vital signs: Reviewed and stable  Last Vitals:  Vitals Value Taken Time  BP 149/61 03/11/24 13:15  Temp    Pulse 55 03/11/24 13:17  Resp 17 03/11/24 13:17  SpO2 96 % 03/11/24 13:17  Vitals shown include unfiled device data.  Last Pain:  Vitals:   03/11/24 1103  PainSc: 0-No pain         Complications: There were no known notable events for this encounter.

## 2024-03-11 NOTE — Anesthesia Procedure Notes (Signed)
 Procedure Name: MAC Date/Time: 03/11/2024 12:34 PM  Performed by: Erick Fitz, CRNAPre-anesthesia Checklist: Patient identified, Emergency Drugs available, Suction available, Patient being monitored and Timeout performed Patient Re-evaluated:Patient Re-evaluated prior to induction Oxygen Delivery Method: Simple face mask Induction Type: IV induction Placement Confirmation: positive ETCO2 and CO2 detector Dental Injury: Teeth and Oropharynx as per pre-operative assessment

## 2024-03-11 NOTE — Op Note (Signed)
" °  PRE-OPERATIVE DIAGNOSIS:  un-needed Port-A-Cath for left   POST-OPERATIVE DIAGNOSIS:  Same   PROCEDURE:  Procedure(s):  REMOVAL PORT-A-CATH  SURGEON:  Surgeon(s):  Jina Nephew, MD  ANESTHESIA:   MAC + local  EBL:   Minimal  SPECIMEN:  None  Complications : none known  Procedure:   Pt was  identified in the holding area and taken to the operating room where she was placed supine on the operating room table.  MAC anesthesia was induced.  The left upper chest was prepped and draped.  The prior incision was anesthetized with local anesthetic.  The incision was opened with a #15 blade.  The subcutaneous tissue was divided with the cautery.  The port was identified and the capsule opened.  The four 2-0 prolene sutures were removed.  The port was then removed and pressure held on the tract.  The catheter appeared intact without evidence of breakage, length was 23 cm.  The wound was inspected for hemostasis, which was achieved with cautery.  The wound was closed with 3-0 vicryl deep dermal interrupted sutures and 4-0 Monocryl running subcuticular suture.  The wound was cleaned, dried, and dressed with dermabond.  The patient was awakened from anesthesia and taken to the PACU in stable condition.  Needle, sponge, and instrument counts are correct.    "

## 2024-03-12 ENCOUNTER — Encounter (HOSPITAL_COMMUNITY): Payer: Self-pay | Admitting: General Surgery

## 2024-03-12 LAB — GLUCOSE, CAPILLARY: Glucose-Capillary: 134 mg/dL — ABNORMAL HIGH (ref 70–99)

## 2024-03-18 ENCOUNTER — Encounter: Payer: Self-pay | Admitting: *Deleted

## 2024-03-19 ENCOUNTER — Encounter: Payer: Self-pay | Admitting: Hematology and Oncology

## 2024-03-24 ENCOUNTER — Ambulatory Visit: Admitting: Radiation Oncology

## 2024-08-17 ENCOUNTER — Ambulatory Visit
